# Patient Record
Sex: Male | Born: 1975 | Race: White | Hispanic: No | Marital: Single | Smoking: Current every day smoker
Health system: Southern US, Community
[De-identification: ages and names within clinical notes are randomized; demographics above are authoritative.]

## PROBLEM LIST (undated history)

## (undated) ENCOUNTER — Emergency Department: Payer: Medicaid Other

## (undated) DIAGNOSIS — F1010 Alcohol abuse, uncomplicated: Secondary | ICD-10-CM

## (undated) DIAGNOSIS — F32A Depression, unspecified: Secondary | ICD-10-CM

## (undated) DIAGNOSIS — F419 Anxiety disorder, unspecified: Secondary | ICD-10-CM

## (undated) DIAGNOSIS — R569 Unspecified convulsions: Secondary | ICD-10-CM

## (undated) DIAGNOSIS — F1410 Cocaine abuse, uncomplicated: Secondary | ICD-10-CM

## (undated) DIAGNOSIS — F329 Major depressive disorder, single episode, unspecified: Secondary | ICD-10-CM

## (undated) HISTORY — PX: FINGER SURGERY: SHX640

---

## 2006-03-03 ENCOUNTER — Emergency Department: Payer: Self-pay | Admitting: Emergency Medicine

## 2009-09-06 ENCOUNTER — Emergency Department: Payer: Self-pay | Admitting: Emergency Medicine

## 2009-10-24 ENCOUNTER — Emergency Department: Payer: Self-pay | Admitting: Emergency Medicine

## 2010-04-12 ENCOUNTER — Emergency Department: Payer: Self-pay | Admitting: Emergency Medicine

## 2010-05-12 ENCOUNTER — Emergency Department: Payer: Self-pay | Admitting: Emergency Medicine

## 2010-05-25 ENCOUNTER — Emergency Department: Payer: Self-pay | Admitting: Emergency Medicine

## 2010-07-10 ENCOUNTER — Emergency Department: Payer: Self-pay | Admitting: Internal Medicine

## 2010-07-17 ENCOUNTER — Ambulatory Visit: Payer: Self-pay | Admitting: Orthopedic Surgery

## 2011-05-24 ENCOUNTER — Emergency Department: Payer: Self-pay | Admitting: *Deleted

## 2011-08-01 ENCOUNTER — Inpatient Hospital Stay: Payer: Self-pay | Admitting: Psychiatry

## 2011-09-02 ENCOUNTER — Emergency Department: Payer: Self-pay | Admitting: Internal Medicine

## 2011-10-11 ENCOUNTER — Emergency Department: Payer: Self-pay | Admitting: *Deleted

## 2011-10-11 LAB — DRUG SCREEN, URINE
Amphetamines, Ur Screen: NEGATIVE (ref ?–1000)
Cannabinoid 50 Ng, Ur ~~LOC~~: POSITIVE (ref ?–50)
Cocaine Metabolite,Ur ~~LOC~~: POSITIVE (ref ?–300)
MDMA (Ecstasy)Ur Screen: NEGATIVE (ref ?–500)
Methadone, Ur Screen: NEGATIVE (ref ?–300)
Opiate, Ur Screen: NEGATIVE (ref ?–300)
Phencyclidine (PCP) Ur S: NEGATIVE (ref ?–25)
Tricyclic, Ur Screen: NEGATIVE (ref ?–1000)

## 2011-10-11 LAB — CBC
HGB: 15.9 g/dL (ref 13.0–18.0)
MCH: 32.4 pg (ref 26.0–34.0)
MCHC: 33.6 g/dL (ref 32.0–36.0)
WBC: 9.5 10*3/uL (ref 3.8–10.6)

## 2011-10-11 LAB — ETHANOL: Ethanol: 168 mg/dL

## 2011-10-11 LAB — COMPREHENSIVE METABOLIC PANEL
BUN: 7 mg/dL (ref 7–18)
Bilirubin,Total: 0.4 mg/dL (ref 0.2–1.0)
Chloride: 106 mmol/L (ref 98–107)
Creatinine: 0.83 mg/dL (ref 0.60–1.30)
Glucose: 105 mg/dL — ABNORMAL HIGH (ref 65–99)
Osmolality: 285 (ref 275–301)
SGPT (ALT): 24 U/L
Total Protein: 7.2 g/dL (ref 6.4–8.2)

## 2011-10-11 LAB — TSH: Thyroid Stimulating Horm: 0.93 u[IU]/mL

## 2011-10-11 LAB — ACETAMINOPHEN LEVEL: Acetaminophen: <2 ug/mL

## 2011-10-11 LAB — SALICYLATE LEVEL: Salicylates, Serum: 3.3 mg/dL — ABNORMAL HIGH

## 2011-10-14 ENCOUNTER — Emergency Department: Payer: Self-pay | Admitting: Unknown Physician Specialty

## 2011-10-14 LAB — CBC
HCT: 49.8 % (ref 40.0–52.0)
HGB: 17.3 g/dL (ref 13.0–18.0)
MCH: 33.2 pg (ref 26.0–34.0)
MCV: 96 fL (ref 80–100)
RBC: 5.19 10*6/uL (ref 4.40–5.90)
RDW: 13 % (ref 11.5–14.5)
WBC: 9.3 10*3/uL (ref 3.8–10.6)

## 2011-10-14 LAB — ACETAMINOPHEN LEVEL: Acetaminophen: <2 ug/mL

## 2011-10-14 LAB — COMPREHENSIVE METABOLIC PANEL
Alkaline Phosphatase: 69 U/L (ref 50–136)
Bilirubin,Total: 0.4 mg/dL (ref 0.2–1.0)
Calcium, Total: 8.6 mg/dL (ref 8.5–10.1)
Co2: 23 mmol/L (ref 21–32)
Creatinine: 0.96 mg/dL (ref 0.60–1.30)
EGFR (Non-African Amer.): >60
SGOT(AST): 23 U/L (ref 15–37)
SGPT (ALT): 25 U/L

## 2011-10-14 LAB — SALICYLATE LEVEL: Salicylates, Serum: 2.8 mg/dL

## 2011-10-14 LAB — ETHANOL: Ethanol: 140 mg/dL

## 2011-10-15 LAB — DRUG SCREEN, URINE
Cannabinoid 50 Ng, Ur ~~LOC~~: POSITIVE (ref ?–50)
Cocaine Metabolite,Ur ~~LOC~~: POSITIVE (ref ?–300)
MDMA (Ecstasy)Ur Screen: NEGATIVE (ref ?–500)
Methadone, Ur Screen: NEGATIVE (ref ?–300)
Opiate, Ur Screen: NEGATIVE (ref ?–300)
Phencyclidine (PCP) Ur S: NEGATIVE (ref ?–25)

## 2011-11-23 ENCOUNTER — Emergency Department: Payer: Self-pay | Admitting: *Deleted

## 2011-11-23 LAB — COMPREHENSIVE METABOLIC PANEL
Alkaline Phosphatase: 74 U/L (ref 50–136)
Anion Gap: 17 — ABNORMAL HIGH (ref 7–16)
Creatinine: 1.36 mg/dL — ABNORMAL HIGH (ref 0.60–1.30)
EGFR (African American): >60
EGFR (Non-African Amer.): >60
Glucose: 80 mg/dL (ref 65–99)
Osmolality: 279 (ref 275–301)
Sodium: 141 mmol/L (ref 136–145)

## 2011-11-23 LAB — TSH: Thyroid Stimulating Horm: 1.28 u[IU]/mL

## 2011-11-23 LAB — CBC
HCT: 48.3 % (ref 40.0–52.0)
HGB: 16.7 g/dL (ref 13.0–18.0)
MCH: 33 pg (ref 26.0–34.0)
MCHC: 34.7 g/dL (ref 32.0–36.0)
RBC: 5.08 10*6/uL (ref 4.40–5.90)
RDW: 12.8 % (ref 11.5–14.5)

## 2011-11-23 LAB — ETHANOL
Ethanol: 190 mg/dL
Ethanol: 88 mg/dL

## 2011-11-23 LAB — DRUG SCREEN, URINE
Amphetamines, Ur Screen: NEGATIVE (ref ?–1000)
Benzodiazepine, Ur Scrn: NEGATIVE (ref ?–200)
Cocaine Metabolite,Ur ~~LOC~~: POSITIVE (ref ?–300)
MDMA (Ecstasy)Ur Screen: NEGATIVE (ref ?–500)
Methadone, Ur Screen: NEGATIVE (ref ?–300)
Opiate, Ur Screen: NEGATIVE (ref ?–300)
Tricyclic, Ur Screen: NEGATIVE (ref ?–1000)

## 2011-12-11 ENCOUNTER — Emergency Department: Payer: Self-pay | Admitting: Emergency Medicine

## 2011-12-13 ENCOUNTER — Emergency Department: Payer: Self-pay | Admitting: Emergency Medicine

## 2011-12-19 ENCOUNTER — Emergency Department: Payer: Self-pay | Admitting: Emergency Medicine

## 2011-12-19 LAB — CBC
HCT: 42 %
HGB: 14.4 g/dL
MCH: 33 pg
MCHC: 34.2 g/dL
MCV: 97 fL
Platelet: 181 x10 3/mm 3
RBC: 4.35 x10 6/mm 3 — ABNORMAL LOW
RDW: 13.3 %
WBC: 6.1 x10 3/mm 3

## 2011-12-19 LAB — DRUG SCREEN, URINE
Amphetamines, Ur Screen: NEGATIVE
Barbiturates, Ur Screen: NEGATIVE
Benzodiazepine, Ur Scrn: NEGATIVE
Cannabinoid 50 Ng, Ur ~~LOC~~: POSITIVE
Cocaine Metabolite,Ur ~~LOC~~: NEGATIVE
MDMA (Ecstasy)Ur Screen: NEGATIVE
Methadone, Ur Screen: NEGATIVE
Opiate, Ur Screen: NEGATIVE
Phencyclidine (PCP) Ur S: NEGATIVE
Tricyclic, Ur Screen: NEGATIVE

## 2011-12-19 LAB — COMPREHENSIVE METABOLIC PANEL WITH GFR
Albumin: 3.6 g/dL
Alkaline Phosphatase: 61 U/L
Anion Gap: 15
BUN: 12 mg/dL
Bilirubin,Total: 0.2 mg/dL
Calcium, Total: 8 mg/dL — ABNORMAL LOW
Chloride: 110 mmol/L — ABNORMAL HIGH
Co2: 22 mmol/L
Creatinine: 1.06 mg/dL
EGFR (African American): >60
EGFR (Non-African Amer.): >60
Glucose: 97 mg/dL
Osmolality: 292
Potassium: 3.7 mmol/L
SGOT(AST): 26 U/L
SGPT (ALT): 25 U/L
Sodium: 147 mmol/L — ABNORMAL HIGH
Total Protein: 6.9 g/dL

## 2011-12-19 LAB — ETHANOL
Ethanol %: 0.187 % — ABNORMAL HIGH
Ethanol: 187 mg/dL

## 2011-12-19 LAB — SALICYLATE LEVEL: Salicylates, Serum: 2.9 mg/dL — ABNORMAL HIGH

## 2011-12-19 LAB — TSH: Thyroid Stimulating Horm: 0.473 u[IU]/mL

## 2012-08-13 ENCOUNTER — Emergency Department: Payer: Self-pay | Admitting: Emergency Medicine

## 2012-08-14 LAB — ETHANOL: Ethanol %: 0.159 % — ABNORMAL HIGH (ref 0.000–0.080)

## 2012-12-06 ENCOUNTER — Emergency Department: Payer: Self-pay | Admitting: Emergency Medicine

## 2012-12-06 LAB — CBC
HCT: 44.9 % (ref 40.0–52.0)
HGB: 14.7 g/dL (ref 13.0–18.0)
Platelet: 175 10*3/uL (ref 150–440)
RBC: 4.54 10*6/uL (ref 4.40–5.90)
RDW: 15 % — ABNORMAL HIGH (ref 11.5–14.5)
WBC: 10.7 10*3/uL — ABNORMAL HIGH (ref 3.8–10.6)

## 2012-12-06 LAB — BASIC METABOLIC PANEL
Anion Gap: 9 (ref 7–16)
Calcium, Total: 8.5 mg/dL (ref 8.5–10.1)
Chloride: 106 mmol/L (ref 98–107)
Co2: 24 mmol/L (ref 21–32)
Creatinine: 0.75 mg/dL (ref 0.60–1.30)
EGFR (African American): >60
EGFR (Non-African Amer.): >60
Glucose: 98 mg/dL (ref 65–99)
Osmolality: 276 (ref 275–301)

## 2013-01-15 ENCOUNTER — Emergency Department: Payer: Self-pay | Admitting: Emergency Medicine

## 2013-01-15 LAB — CBC WITH DIFFERENTIAL/PLATELET
Basophil #: 0.1 10*3/uL (ref 0.0–0.1)
Eosinophil #: 0.2 10*3/uL (ref 0.0–0.7)
Eosinophil %: 2.1 %
Lymphocyte %: 22.7 %
MCH: 33.9 pg (ref 26.0–34.0)
Monocyte #: 0.6 x10 3/mm (ref 0.2–1.0)
Neutrophil #: 5.4 10*3/uL (ref 1.4–6.5)
Platelet: 153 10*3/uL (ref 150–440)
RBC: 4.69 10*6/uL (ref 4.40–5.90)

## 2013-01-15 LAB — COMPREHENSIVE METABOLIC PANEL
BUN: 9 mg/dL (ref 7–18)
Calcium, Total: 8.3 mg/dL — ABNORMAL LOW (ref 8.5–10.1)
Chloride: 111 mmol/L — ABNORMAL HIGH (ref 98–107)
Co2: 24 mmol/L (ref 21–32)
Glucose: 96 mg/dL (ref 65–99)
Potassium: 3.8 mmol/L (ref 3.5–5.1)
SGOT(AST): 25 U/L (ref 15–37)
Sodium: 143 mmol/L (ref 136–145)
Total Protein: 7.1 g/dL (ref 6.4–8.2)

## 2013-01-17 ENCOUNTER — Emergency Department: Payer: Self-pay | Admitting: Unknown Physician Specialty

## 2013-01-17 LAB — COMPREHENSIVE METABOLIC PANEL
Anion Gap: 8 (ref 7–16)
Bilirubin,Total: 0.2 mg/dL (ref 0.2–1.0)
Calcium, Total: 8.3 mg/dL — ABNORMAL LOW (ref 8.5–10.1)
Creatinine: 0.79 mg/dL (ref 0.60–1.30)
EGFR (African American): >60
Glucose: 112 mg/dL — ABNORMAL HIGH (ref 65–99)
SGPT (ALT): 19 U/L (ref 12–78)
Sodium: 143 mmol/L (ref 136–145)

## 2013-01-17 LAB — URINALYSIS, COMPLETE
Bilirubin,UR: NEGATIVE
Blood: NEGATIVE
Leukocyte Esterase: NEGATIVE
Ph: 5 (ref 4.5–8.0)
Protein: NEGATIVE
Specific Gravity: 1.019 (ref 1.003–1.030)
Squamous Epithelial: <1

## 2013-01-17 LAB — DRUG SCREEN, URINE
Methadone, Ur Screen: NEGATIVE (ref ?–300)
Opiate, Ur Screen: NEGATIVE (ref ?–300)
Phencyclidine (PCP) Ur S: NEGATIVE (ref ?–25)
Tricyclic, Ur Screen: NEGATIVE (ref ?–1000)

## 2013-01-17 LAB — CBC
MCH: 33.2 pg (ref 26.0–34.0)
MCHC: 33.8 g/dL (ref 32.0–36.0)
MCV: 98 fL (ref 80–100)
Platelet: 164 10*3/uL (ref 150–440)
RBC: 4.88 10*6/uL (ref 4.40–5.90)

## 2013-01-17 LAB — TSH: Thyroid Stimulating Horm: 0.805 u[IU]/mL

## 2013-01-17 LAB — ETHANOL: Ethanol: 240 mg/dL

## 2013-01-25 ENCOUNTER — Emergency Department: Payer: Self-pay | Admitting: Emergency Medicine

## 2013-03-14 ENCOUNTER — Emergency Department: Payer: Self-pay | Admitting: Emergency Medicine

## 2013-03-14 LAB — URINALYSIS, COMPLETE
Bacteria: NONE SEEN
Bilirubin,UR: NEGATIVE
Ketone: NEGATIVE
Leukocyte Esterase: NEGATIVE
Nitrite: NEGATIVE
Protein: NEGATIVE
Squamous Epithelial: <1

## 2013-04-08 ENCOUNTER — Emergency Department: Payer: Self-pay | Admitting: Internal Medicine

## 2013-04-08 LAB — ETHANOL
Ethanol %: 0.315 % (ref 0.000–0.080)
Ethanol: 315 mg/dL

## 2013-04-08 LAB — COMPREHENSIVE METABOLIC PANEL
Alkaline Phosphatase: 101 U/L (ref 50–136)
Anion Gap: 10 (ref 7–16)
Bilirubin,Total: 0.4 mg/dL (ref 0.2–1.0)
Calcium, Total: 8.7 mg/dL (ref 8.5–10.1)
Chloride: 101 mmol/L (ref 98–107)
EGFR (African American): >60
EGFR (Non-African Amer.): >60
Potassium: 4 mmol/L (ref 3.5–5.1)
SGOT(AST): 51 U/L — ABNORMAL HIGH (ref 15–37)
SGPT (ALT): 47 U/L (ref 12–78)
Total Protein: 7.8 g/dL (ref 6.4–8.2)

## 2013-04-08 LAB — CBC
HCT: 47.5 % (ref 40.0–52.0)
HGB: 16.7 g/dL (ref 13.0–18.0)
MCH: 34.1 pg — ABNORMAL HIGH (ref 26.0–34.0)
Platelet: 188 10*3/uL (ref 150–440)

## 2013-04-08 LAB — ACETAMINOPHEN LEVEL: Acetaminophen: <2 ug/mL

## 2013-04-08 LAB — URINALYSIS, COMPLETE
Bacteria: NONE SEEN
Blood: NEGATIVE
Glucose,UR: NEGATIVE mg/dL (ref 0–75)
Leukocyte Esterase: NEGATIVE
Ph: 5 (ref 4.5–8.0)
Protein: NEGATIVE
Specific Gravity: 1.005 (ref 1.003–1.030)
WBC UR: <1 /HPF (ref 0–5)

## 2013-04-08 LAB — DRUG SCREEN, URINE
Barbiturates, Ur Screen: NEGATIVE (ref ?–200)
Benzodiazepine, Ur Scrn: NEGATIVE (ref ?–200)
Cocaine Metabolite,Ur ~~LOC~~: POSITIVE (ref ?–300)
MDMA (Ecstasy)Ur Screen: NEGATIVE (ref ?–500)
Opiate, Ur Screen: NEGATIVE (ref ?–300)
Phencyclidine (PCP) Ur S: NEGATIVE (ref ?–25)
Tricyclic, Ur Screen: NEGATIVE (ref ?–1000)

## 2013-04-09 LAB — CBC
HCT: 47.6 % (ref 40.0–52.0)
MCH: 33.5 pg (ref 26.0–34.0)
MCHC: 34.4 g/dL (ref 32.0–36.0)
MCV: 97 fL (ref 80–100)
Platelet: 174 10*3/uL (ref 150–440)
RBC: 4.89 10*6/uL (ref 4.40–5.90)
RDW: 14.1 % (ref 11.5–14.5)

## 2013-04-09 LAB — ETHANOL: Ethanol: <3 mg/dL

## 2013-07-03 ENCOUNTER — Emergency Department: Payer: Self-pay | Admitting: Emergency Medicine

## 2013-07-03 LAB — CBC
HCT: 49.1 % (ref 40.0–52.0)
HGB: 17.2 g/dL (ref 13.0–18.0)
MCH: 34.9 pg — ABNORMAL HIGH (ref 26.0–34.0)
MCHC: 35.1 g/dL (ref 32.0–36.0)
MCV: 100 fL (ref 80–100)
Platelet: 189 10*3/uL (ref 150–440)
RBC: 4.93 10*6/uL (ref 4.40–5.90)
RDW: 14.4 % (ref 11.5–14.5)
WBC: 6.9 10*3/uL (ref 3.8–10.6)

## 2013-07-04 LAB — COMPREHENSIVE METABOLIC PANEL
Albumin: 4 g/dL (ref 3.4–5.0)
Alkaline Phosphatase: 103 U/L (ref 50–136)
BUN: 7 mg/dL (ref 7–18)
Calcium, Total: 8.6 mg/dL (ref 8.5–10.1)
EGFR (African American): >60
EGFR (Non-African Amer.): >60
Potassium: 3.7 mmol/L (ref 3.5–5.1)
SGOT(AST): 93 U/L — ABNORMAL HIGH (ref 15–37)
SGPT (ALT): 87 U/L — ABNORMAL HIGH (ref 12–78)

## 2013-07-04 LAB — DRUG SCREEN, URINE
Amphetamines, Ur Screen: NEGATIVE (ref ?–1000)
Barbiturates, Ur Screen: NEGATIVE (ref ?–200)
Benzodiazepine, Ur Scrn: NEGATIVE (ref ?–200)
Cocaine Metabolite,Ur ~~LOC~~: POSITIVE (ref ?–300)
Methadone, Ur Screen: NEGATIVE (ref ?–300)
Opiate, Ur Screen: NEGATIVE (ref ?–300)
Phencyclidine (PCP) Ur S: NEGATIVE (ref ?–25)
Tricyclic, Ur Screen: NEGATIVE (ref ?–1000)

## 2013-07-04 LAB — URINALYSIS, COMPLETE
Bilirubin,UR: NEGATIVE
Blood: NEGATIVE
Nitrite: NEGATIVE
RBC,UR: NONE SEEN /HPF (ref 0–5)
Specific Gravity: 1.009 (ref 1.003–1.030)

## 2013-07-04 LAB — ACETAMINOPHEN LEVEL: Acetaminophen: <2 ug/mL

## 2013-07-04 LAB — SALICYLATE LEVEL: Salicylates, Serum: 5.2 mg/dL — ABNORMAL HIGH

## 2013-07-04 LAB — ETHANOL: Ethanol %: 0.294 % — ABNORMAL HIGH (ref 0.000–0.080)

## 2013-07-24 ENCOUNTER — Emergency Department: Payer: Self-pay | Admitting: Emergency Medicine

## 2013-07-24 LAB — COMPREHENSIVE METABOLIC PANEL
Alkaline Phosphatase: 81 U/L (ref 50–136)
Anion Gap: 4 — ABNORMAL LOW (ref 7–16)
BUN: 13 mg/dL (ref 7–18)
Bilirubin,Total: 0.3 mg/dL (ref 0.2–1.0)
Calcium, Total: 9 mg/dL (ref 8.5–10.1)
Chloride: 106 mmol/L (ref 98–107)
Creatinine: 0.83 mg/dL (ref 0.60–1.30)
EGFR (African American): >60
Potassium: 3.9 mmol/L (ref 3.5–5.1)
SGPT (ALT): 51 U/L (ref 12–78)
Sodium: 136 mmol/L (ref 136–145)
Total Protein: 7.3 g/dL (ref 6.4–8.2)

## 2013-07-24 LAB — DRUG SCREEN, URINE
Benzodiazepine, Ur Scrn: NEGATIVE (ref ?–200)
Cocaine Metabolite,Ur ~~LOC~~: NEGATIVE (ref ?–300)
MDMA (Ecstasy)Ur Screen: NEGATIVE (ref ?–500)
Methadone, Ur Screen: NEGATIVE (ref ?–300)
Opiate, Ur Screen: NEGATIVE (ref ?–300)
Phencyclidine (PCP) Ur S: NEGATIVE (ref ?–25)
Tricyclic, Ur Screen: NEGATIVE (ref ?–1000)

## 2013-07-24 LAB — URINALYSIS, COMPLETE
Bacteria: NONE SEEN
Bilirubin,UR: NEGATIVE
Blood: NEGATIVE
Glucose,UR: NEGATIVE mg/dL (ref 0–75)
Ketone: NEGATIVE
Leukocyte Esterase: NEGATIVE
Nitrite: NEGATIVE
Ph: 6 (ref 4.5–8.0)
RBC,UR: NONE SEEN /HPF (ref 0–5)
Specific Gravity: 1.01 (ref 1.003–1.030)

## 2013-07-24 LAB — CBC
HGB: 15.5 g/dL (ref 13.0–18.0)
MCHC: 35.2 g/dL (ref 32.0–36.0)
WBC: 11 10*3/uL — ABNORMAL HIGH (ref 3.8–10.6)

## 2013-07-24 LAB — ETHANOL: Ethanol %: <0.003 % (ref 0.000–0.080)

## 2013-07-24 LAB — TSH: Thyroid Stimulating Horm: 2.56 u[IU]/mL

## 2014-01-06 ENCOUNTER — Emergency Department: Payer: Self-pay | Admitting: Emergency Medicine

## 2014-08-16 ENCOUNTER — Emergency Department: Payer: Self-pay | Admitting: Emergency Medicine

## 2015-01-17 NOTE — Consult Note (Signed)
PATIENT NAME:  Alex [name], Alex [name] MR#:  [id number] DATE OF BIRTH:  1976/08/24  DATE OF CONSULTATION:  01/17/2013  REFERRING PHYSICIAN:  Lucrezia Europe, MD  CONSULTING PHYSICIAN:  Ardeen Fillers. Garnetta Buddy, MD  REASON FOR CONSULTATION: Alcohol detox.   HISTORY OF PRESENT ILLNESS: The patient is 39 year old male with a long history of substance dependence and depression who was brought to the ER by the police stating, "I had a bad day, and I went the other way." The patient was recently discharged from the ED in January 2013 and started using drugs and alcohol. He reported that he got depressed and was using alcohol for the past 2 to 3 days. He reported that he does not have any suicidal ideations but had a couple of beers and some cocaine. He reported that he wants to keep away from using that stuff because that makes him messed up. He was very aggressive and agitated at the initial assessment with the ED team. He also mentioned that he had an appointment with the ACT team on 01/29/2013.   During my assessment, the patient reported that he was consuming liquor and beer with his brother. He was drinking for 2 to 3 days. He reported that he also smoked 1joint of marijuana and spent $40 on cocaine. The patient reported that his drug of choice is marijuana. However, when he starts using drugs he becomes more depressed and also starts having suicidal thoughts at that time. However, he also is not having any thoughts to harm himself or anybody else. He reported that he was prescribed citalopram by Dr. Quillian Quince in [facility], which was really helping him; but he has not taken any medication for the past couple of days, and he started drinking alcohol. He reported that when he is using the medication on a persistent basis it really helps with his depressive symptoms. He currently denied having any anxiety, paranoia or any depressive symptoms.   PAST PSYCHIATRIC HISTORY: The patient has a history of previous admission to the hospital  in the past. He was discharged for followup in the community. He has been seen in the Emergency Room for similar presentations while he was intoxicated. He has been prescribed citalopram and trazodone, for which he remains compliant. He is supposed to be seeing ACT team on May 5th.    MEDICAL HISTORY: The patient currently denied having any medical problems.   SOCIAL HISTORY: The patient currently receives Disability for unknown condition. He lives with his mother and brother. He currently denied having any legal charges. He is currently not married and does not have any children.   SUBSTANCE ABUSE HISTORY: The patient reported that he has been using alcohol for years. His drug of choice is marijuana.   REVIEW OF SYSTEMS:  CONSTITUTIONAL: Currently denied having any fever or chills. No weight changes.  EYES: No double or blurred vision.  RESPIRATORY: No shortness of breath or cough.  CARDIOVASCULAR: No chest pain, orthopnea.  GASTROINTESTINAL: No abdominal pain, nausea, vomiting or diarrhea.  GENITOURINARY: No incontinence or frequency.  ENDOCRINE: No heat or cold intolerance.  LYMPHATIC: No anemia or easy bruising.  INTEGUMENTARY: No acne or rash.  MUSCULOSKELETAL: No muscle or joint pain.  NEUROLOGIC: No tingling or weakness.   VITAL SIGNS: Temperature  97.8, pulse 94, respirations 18, blood pressure 121/71.  LABORATORY DATA: Glucose 112, BUN 9, creatinine 0.79, sodium 143, potassium 3.5, chloride 110, bicarbonate 25, anion gap 8, osmolality 284, calcium 8.3.  Blood alcohol level 240. Protein 7.5, albumin  3.9, bilirubin 0.2. Urine drug screen positive for cocaine and cannabinoids. WBC 7.7, RBC 4.8, hemoglobin 16.2, hematocrit 47.9, platelet count 164.   MENTAL STATUS EXAMINATION: The patient is a moderately-built male who appeared his stated age. He was calm and cooperative. He maintained fair eye contact. His speech was normal in tone and volume. Mood was fine. Affect was congruent.  Thought process was logical, goal-directed. Thought content was nondelusional. He demonstrated poor insight and judgment regarding his drug use.   DIAGNOSTIC IMPRESSION: AXIS I: 1.  Major depressive disorder, recurrent, moderate.  2.  Polysubstance dependence.   AXIS II:  None.  AXIS III:  No diagnosis.   TREATMENT PLAN:  I discussed with the patient at length about his recurrent use of drugs as well as relapsing on alcohol, and he does not have any history of seizures and is not showing any significant symptoms of alcohol withdrawal. He was also given some counseling about substance abuse, and he currently denied that he is interested in going to a substance abuse treatment program at this time. He reported that he has a follow-up appointment with the ACT team, and he will go there. The patient will be released from the involuntary commitment at this time. He will continue with his citalopram 20 mg as prescribed by Dr. Quillian QuinceBliss. He will also follow up with the ACT team appointment. The patient contracted for safety and will be discharged from the Emergency Room at this time. No new prescriptions given.   Thank you for allowing me to participate in the care of this patient.   ____________________________ Ardeen FillersUzma S. Garnetta BuddyFaheem, MD usf:cb D: 01/18/2013 15:56:06 ET T: 01/18/2013 16:13:47 ET JOB#: [id number]  cc: Ardeen FillersUzma S. Garnetta BuddyFaheem, MD, <Dictator> Rhunette CroftUZMA S Nowell Sites MD ELECTRONICALLY SIGNED 01/21/2013 11:51

## 2015-01-17 NOTE — Consult Note (Signed)
Brief Consult Note: Diagnosis: alcohol dependence.   Patient was seen by consultant.   Consult note dictated.   Recommend further assessment or treatment.   Discussed with Attending MD.   Comments: Psychiatry: Patient seen. Patient with alcohol and cocaine dependence presents after having made threatening statements and showing agitated behavior. Patient will be referred to [facility] tomorrow under the current IVC. Patient is aware of this. Orders also done for presumptive VD treatment. Continue detox orders.  Electronic Signatures: Audery Amellapacs, John T (MD)  (Signed 08-Oct-14 18:31)  Authored: Brief Consult Note   Last Updated: 08-Oct-14 18:31 by Audery Amellapacs, John T (MD)

## 2015-01-17 NOTE — Consult Note (Signed)
PATIENT NAME:  Alex [name], [name] MR#:  [id number] DATE OF BIRTH:  Jun 25, 1976  DATE OF CONSULTATION:  07/04/2013  REFERRING PHYSICIAN:   CONSULTING PHYSICIAN:  Audery AmelJohn T. Clapacs, MD  IDENTIFYING INFORMATION AND REASON FOR CONSULTATION: A 39 year old man who came to the hospital intoxicated under involuntary commitment. Consult for appropriate disposition.   HISTORY OF PRESENT ILLNESS: Information obtained from the patient and the chart. The patient was brought to the Emergency Room after making threats. He had made suicidal statements and also had threatened to assault or kill other people, specifically a girlfriend who he believes has given him a venereal disease. The patient tells me that he remembers saying that he was going to hurt himself. At this point, he denies any intention to hurt himself but he is not sure whether he really meant it at the time. Evidently, he was quite irritated and agitated when he came into the Emergency Room last night. He admits that he drinks "too much." He will not even hazard a guess as to the total amount. He says that he has been drinking steadily ever since he got out of prison in January and that was only 90 days. Before that, he has a long-standing history of drinking as well. The patient currently is not reporting specific mood symptoms although he does say he feels like his life is under a lot of stress. Interestingly, he says that a big part of his stress is worrying about his mother while at the same time some of his threats occur towards his mother, whom he gets angry at because she wants him to stop drinking.   PAST PSYCHIATRIC HISTORY: He has had multiple visits to our Emergency Room related to substance abuse. The patient is on chronic disability. I do not have full old records but, having interacted with him, I would not be surprised if he has some developmental disability. He is treated chronically with an antidepressant. It is not clear how well it works. He  denies having any history of suicide attempts in the past. He has been aggressive in the past, especially when drinking.   PAST MEDICAL HISTORY: No history of heart disease, seizures, diabetes or other significant chronic medical problems.   SUBSTANCE ABUSE HISTORY: He says he has been drinking heavily. He denies ever having had a seizure related to drinking and denies any history of delirium tremens. He also uses cocaine and marijuana regularly. He admits that they make his mood bad and cause problems for him. He has never been to any kind of inpatient program. He says that he has tried to quit drinking but never been able to do it on his own for more than a day or so.   SOCIAL HISTORY: The patient is disabled, apparently due to mental health concerns. He lives with his mother. Other than drinking, it sounds like he does not have a lot of that goes on in his life.   FAMILY HISTORY: Unknown.   REVIEW OF SYSTEMS: Currently, he complains that he is having discomfort when he urinates and he says he is certain that he has a venereal disease. Otherwise, he is not reporting nausea, vomiting, diarrhea, any pulmonary complaints or any other pain. He feels a little bit shaky but not too bad.   MENTAL STATUS EXAMINATION: The patient appears his stated age. He was passively cooperative with the interview. He was awake when I came in to see him and stayed awake throughout the interview. Eye contact intermittent. Psychomotor activity  normal. Speech decreased in total amount but otherwise unremarkable. Affect was somewhat inappropriately goofy. He smiles and giggles a lot. His mood was stated as okay. His thoughts appear slow and somewhat simple and concrete. He did not make any obviously delusional statements. Denies hallucinations. Denies any current suicidal or homicidal ideation. His judgment and insight are chronically impaired. Intelligence appears to probably be below average. Alert and oriented currently.    LABORATORY RESULTS: Drug screen was positive for cocaine and cannabis. Alcohol level late last night was 294. TSH normal at 1.59. ALT and AST both elevated at 87 and 93 respectively. CBC unremarkable. Urinalysis looks clean. Salicylates and acetaminophen nontoxic.   ASSESSMENT: A 39 year old man with alcohol dependence and cocaine dependence who presented with agitated, threatening, hostile behavior while intoxicated which has been a recurrent problem for him. The patient has not been able to stop drinking on his own despite referrals to outpatient treatment. Given the degree of disability he has had from it, it seems appropriate that he go to a more intensive inpatient substance abuse treatment program.   TREATMENT PLAN: He has already been accepted to the alcohol and drug abuse treatment center and has a bed available tomorrow. The patient is aware of this and does not make any real complaint. I would continue the detox protocol that he is getting for now. I have gone ahead and given him empiric treatment for gonorrhea and Chlamydia. Educational therapy done.   DIAGNOSIS, PRINCIPAL AND PRIMARY:  AXIS I:  1.  Alcohol dependence.  2.  Cocaine dependence.  3.  Substance-induced mood disorder.  AXIS II: Rule out developmental disability.  AXIS III: Alcohol withdrawal, presumptive gonorrhea and/or chlamydia.  AXIS IV: Moderate chronic stress from disability.  AXIS V: Functioning at time of evaluation 39.   ____________________________ Audery Amel, MD jtc:cs D: 07/04/2013 18:38:30 ET T: 07/04/2013 18:58:52 ET JOB#: [id number]  cc: Audery Amel, MD, <Dictator> Audery Amel MD ELECTRONICALLY SIGNED 07/06/2013 11:18

## 2015-01-17 NOTE — Consult Note (Signed)
Brief Consult Note: Diagnosis: alcohol dependence.   Patient was seen by consultant.   Recommend further assessment or treatment.   Discussed with Attending MD.   Comments: Pt seen in ED B[facility]. He was admitted for  alcohol and cocaine dependence  and was having making threatening statements and showing agitated behavior. Patient appeared more calm today. he stated that he was taking Celexa and Trazodone. He is going to  [facility] where he will complete his treatment . He denied any SI/HI or plans.  Electronic Signatures: Rhunette CroftFaheem, Epiphany Seltzer S (MD)  (Signed 09-Oct-14 09:45)  Authored: Brief Consult Note   Last Updated: 09-Oct-14 09:45 by Rhunette CroftFaheem, Ifeoluwa Bartz S (MD)
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
## 2015-01-19 NOTE — Consult Note (Signed)
Brief Consult Note: Diagnosis: depression nos, polysubstance dependence.   Patient was seen by consultant.   Recommend further assessment or treatment.   Orders entered.   Comments: Psychiatry: PAtient seen and chart reviewed. Patient was intoxicated but now has slept and sobered up. Denies any suicidal ideation. Denies psychosis. Claims his meds were lost last ER visit and so he has been out of them. We discussed treatment but he does not want to consider SA treatment.  No indication for admission. Counceling done re depression and SA. Will give new copies of his prescriptions for celexa 20mg  qday and trazodone 100mg  qhs. Staff will assist in referral to outpt treatment.  Electronic Signatures: Audery Amellapacs, Renelda Kilian T (MD)  (Signed 18-Jan-13 12:11)  Authored: Brief Consult Note   Last Updated: 18-Jan-13 12:11 by Audery Amellapacs, Vane Yapp T (MD)

## 2015-01-19 NOTE — Consult Note (Signed)
PATIENT NAME:  Alex [name], Alex [name] MR#:  [id number] DATE OF BIRTH:  July 31, 1976  DATE OF CONSULTATION:  10/15/2011  REFERRING PHYSICIAN:   CONSULTING PHYSICIAN:  Audery Amel, MD  IDENTIFYING INFORMATION AND REASON FOR CONSULT: This is a 39 year old man with a history of substance abuse and depression who brought himself in to the Emergency Room yesterday stating that he "couldn't take it anymore".   HISTORY OF PRESENT ILLNESS: The patient was interviewed and I discussed the case with the other psychiatric staff and reviewed his chart. The patient came in last night and was intoxicated with an elevated alcohol level and a drug screen positive for cocaine and marijuana. At no time was he voicing any suicidal ideation nor was he aggressive or violent or threatening. He did make some statements about how he couldn't take it anymore but it doesn't seem there was anything specific that he said. He has slept through the night and was re-interviewed today. The patient tells me that yesterday he felt like he just couldn't stay around his house anymore. I asked him whether this was because he was angry and he denied it, I asked him if it was because there was something there that was upsetting him and he denied it. I suggested that he may just be restless and bored and he said that may be part of it. He said that his mood has been a little bit more depressed but is better now. He denies any suicidal ideation or wish to die. He admits that he drinks alcohol daily. Yesterday he drank about a fifth of liquor by his estimate. He also admits that he used cocaine and has been using marijuana. He tends to minimize the frequency with which he is doing this. He does not report any psychotic symptoms. The patient did not show any real insight into how his substance use is affecting his mood and his life. I suggested to him that continued alcohol and drug use was probably making him more depressed and anxious and making it  impossible for him to function well and achieve the things that he wants to do. He passively agrees to some of this but when I asked him if he would be interested in assistance in stopping drinking or using drugs he says that he would prefer not to at this point. The patient was counseled about the risks including risk of death by suicide, accident, or illnesses related to ongoing substance abuse. He will not tell me that there has been any new change in his circumstances at home. He tells me that when he was here in the Emergency Room three days ago or so he had his antidepressant medicine with him and that it was misplaced. We have asked around and it sounds like this is probably a plausible story.   PAST PSYCHIATRIC HISTORY: He has had one previous admission here to the hospital just a couple of months ago, similar circumstances. It was a fairly brief hospitalization and he was discharged for follow-up treatment in the community. Since then he has had a couple of trips to the Emergency Room while he was intoxicated. On at least one occasion he has talked about suicide but then when he sobers up he denies that he has suicidal intent or plan. He has been treated with citalopram and trazodone and says that he did think they were helping with his mood. It does not seem like he has really ever engaged in any kind of substance abuse treatment.  He denies that he has ever actually tried to kill himself in the past.   PAST MEDICAL HISTORY: No known ongoing medical problems.   SOCIAL HISTORY: The patient receives disability for an unknown condition. He lives with his mother and brother. He says that he has very little money and no transportation. He denies, however, that he fights or that there is hostility at the home. He is not married and has no children.   SUBSTANCE ABUSE HISTORY: He is vague about his substance use, sounds like he's been using alcohol for years, intermittent use of drugs. Has never been to an  inpatient rehab. Does not have much experience with substance abuse treatment. Does not seem to have much interest in it.   REVIEW OF SYSTEMS: He says he is still feeling tired and feels a little bit depressed but that he is better than yesterday. He is not throwing up or nauseated or having diarrhea. Does not have any acute pain. No suicidal ideation. No hallucinations.   MENTAL STATUS EXAMINATION: Disheveled man interviewed in the Emergency Room. He was asleep when I went in but woke up easily and was cooperative with the interview. Eye contact was decreased. Psychomotor activity was a little bit sluggish. Speech was decreased in tone and in total production. Easy to understand when he did speak. Affect was a little bit blunted but not completely. Not bizarre. Mood was stated as still a little bit depressed but better. Thoughts were slow but lucid. No evidence of delusions, paranoia, or grossly bizarre thinking. Denies auditory or visual hallucinations. He denies any suicidal or homicidal ideation whatsoever. His judgment and insight both seem to be somewhat impaired especially regarding his substance abuse.   TREATMENT PLAN: The patient is not currently suicidal. Has not done anything to hurt himself. He does not have a history of seizures or DTs and is not showing significant symptoms of alcohol withdrawal. He does not have another medical problem to put him at high risk that would require hospitalization. I gave him some counseling about substance abuse and about the risks that he is running and also some counseling about his depression. I offered him that if he voluntarily wanted to come in to the hospital we could discuss this as an option. He declines this and does not seem to be very interested. He did request that his medications be continued. He is pretty disappointed that we have not been able to find his old bottles. I told him I would write a new prescription for them though.   TREATMENT PLAN:   1. He will be released from the Emergency Room. He is not under involuntary petition. I have talked with the staff here and [facility] for follow-up care.  2. I have written a new prescription for citalopram 20 mg a day and trazodone 100 mg at night which he was taking previously with some benefit. He will be given a one month's supply prescription for those.  3. He is reminded that he can come back to the Emergency Room in the future if needed.   DIAGNOSES PRINCIPLE AND PRIMARY:  AXIS I: Depression, not otherwise specified.   SECONDARY DIAGNOSES:  AXIS I: Polysubstance dependence.   AXIS II: Deferred.   AXIS III: No diagnosis.   AXIS IV: Moderate to severe. Ongoing stress from poverty, lack of transportation, lack of resources.   AXIS V: Functioning at time of evaluation 49.   ____________________________ Jackquline DenmarkJohn T.  Clapacs, MD jtc:drc D: 10/15/2011 12:21:14 ET T: 10/15/2011 12:40:58 ET JOB#: [id number]  cc: Audery Amel, MD, <Dictator> Audery Amel MD ELECTRONICALLY SIGNED 10/15/2011 13:12

## 2015-02-24 ENCOUNTER — Encounter: Payer: Self-pay | Admitting: *Deleted

## 2015-02-24 ENCOUNTER — Emergency Department
Admission: EM | Admit: 2015-02-24 | Discharge: 2015-02-24 | Disposition: A | Discharge status: Home / Self Care | Attending: Emergency Medicine | Admitting: Emergency Medicine

## 2015-02-24 DIAGNOSIS — W228XXA Striking against or struck by other objects, initial encounter: Secondary | ICD-10-CM | POA: Insufficient documentation

## 2015-02-24 DIAGNOSIS — Y9389 Activity, other specified: Secondary | ICD-10-CM | POA: Insufficient documentation

## 2015-02-24 DIAGNOSIS — T148XXA Other injury of unspecified body region, initial encounter: Secondary | ICD-10-CM

## 2015-02-24 DIAGNOSIS — S0990XA Unspecified injury of head, initial encounter: Secondary | ICD-10-CM | POA: Insufficient documentation

## 2015-02-24 DIAGNOSIS — S6992XA Unspecified injury of left wrist, hand and finger(s), initial encounter: Secondary | ICD-10-CM | POA: Insufficient documentation

## 2015-02-24 DIAGNOSIS — S0083XA Contusion of other part of head, initial encounter: Secondary | ICD-10-CM | POA: Insufficient documentation

## 2015-02-24 DIAGNOSIS — IMO0002 Reserved for concepts with insufficient information to code with codable children: Secondary | ICD-10-CM

## 2015-02-24 DIAGNOSIS — F10129 Alcohol abuse with intoxication, unspecified: Secondary | ICD-10-CM | POA: Insufficient documentation

## 2015-02-24 DIAGNOSIS — Z0289 Encounter for other administrative examinations: Secondary | ICD-10-CM | POA: Insufficient documentation

## 2015-02-24 DIAGNOSIS — S6991XA Unspecified injury of right wrist, hand and finger(s), initial encounter: Secondary | ICD-10-CM | POA: Insufficient documentation

## 2015-02-24 DIAGNOSIS — Z720 Tobacco use: Secondary | ICD-10-CM | POA: Insufficient documentation

## 2015-02-24 DIAGNOSIS — Y998 Other external cause status: Secondary | ICD-10-CM | POA: Insufficient documentation

## 2015-02-24 DIAGNOSIS — S0081XA Abrasion of other part of head, initial encounter: Secondary | ICD-10-CM | POA: Insufficient documentation

## 2015-02-24 DIAGNOSIS — Y9289 Other specified places as the place of occurrence of the external cause: Secondary | ICD-10-CM | POA: Insufficient documentation

## 2015-02-24 HISTORY — DX: Anxiety disorder, unspecified: F41.9
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
## 2015-02-24 NOTE — Discharge Instructions (Signed)
Abrasion An abrasion is a cut or scrape of the skin. Abrasions do not extend through all layers of the skin and most heal within 10 days. It is important to care for your abrasion properly to prevent infection. CAUSES  Most abrasions are caused by falling on, or gliding across, the ground or other surface. When your skin rubs on something, the outer and inner layer of skin rubs off, causing an abrasion. DIAGNOSIS  Your caregiver will be able to diagnose an abrasion during a physical exam.  TREATMENT  Your treatment depends on how large and deep the abrasion is. Generally, your abrasion will be cleaned with water and a mild soap to remove any dirt or debris. An antibiotic ointment may be put over the abrasion to prevent an infection. A bandage (dressing) may be wrapped around the abrasion to keep it from getting dirty.  You may need a tetanus shot if:  You cannot remember when you had your last tetanus shot.  You have never had a tetanus shot.  The injury broke your skin. If you get a tetanus shot, your arm may swell, get red, and feel warm to the touch. This is common and not a problem. If you need a tetanus shot and you choose not to have one, there is a rare chance of getting tetanus. Sickness from tetanus can be serious.  HOME CARE INSTRUCTIONS   If a dressing was applied, change it at least once a day or as directed by your caregiver. If the bandage sticks, soak it off with warm water.   Wash the area with water and a mild soap to remove all the ointment 2 times a day. Rinse off the soap and pat the area dry with a clean towel.   Reapply any ointment as directed by your caregiver. This will help prevent infection and keep the bandage from sticking. Use gauze over the wound and under the dressing to help keep the bandage from sticking.   Change your dressing right away if it becomes wet or dirty.   Only take over-the-counter or prescription medicines for pain, discomfort, or fever as  directed by your caregiver.   Follow up with your caregiver within 24-48 hours for a wound check, or as directed. If you were not given a wound-check appointment, look closely at your abrasion for redness, swelling, or pus. These are signs of infection. SEEK IMMEDIATE MEDICAL CARE IF:   You have increasing pain in the wound.   You have redness, swelling, or tenderness around the wound.   You have pus coming from the wound.   You have a fever or persistent symptoms for more than 2-3 days.  You have a fever and your symptoms suddenly get worse.  You have a bad smell coming from the wound or dressing.  MAKE SURE YOU:   Understand these instructions.  Will watch your condition.  Will get help right away if you are not doing well or get worse. Document Released: 06/23/2005 Document Revised: 08/30/2012 Document Reviewed: 08/17/2011 [facility] Patient Information 2015 [place], [place]. This information is not intended to replace advice given to you by your health care provider. Make sure you discuss any questions you have with your health care provider.  Contusion A contusion is a deep bruise. Contusions are the result of an injury that caused bleeding under the skin. The contusion may turn blue, purple, or yellow. Minor injuries will give you a painless contusion, but more severe contusions may stay painful and swollen  result of an injury that caused bleeding under the skin. The contusion may turn blue, purple, or yellow. Minor injuries will give you a painless contusion, but more severe contusions may stay painful and swollen for a few weeks.   CAUSES   A contusion is usually caused by a blow, trauma, or direct force to an area of the body.  SYMPTOMS    Swelling and redness of the injured area.   Bruising of the injured area.   Tenderness and soreness of the injured area.   Pain.  DIAGNOSIS   The diagnosis can be made by taking a history and physical exam. An X-ray, CT scan, or MRI may be needed to determine if there were any associated injuries, such as fractures.  TREATMENT   Specific treatment will depend on what area of the body was injured. In general, the best treatment for a contusion is  resting, icing, elevating, and applying cold compresses to the injured area. Over-the-counter medicines may also be recommended for pain control. Ask your caregiver what the best treatment is for your contusion.  HOME CARE INSTRUCTIONS    Put ice on the injured area.   Put ice in a plastic bag.   Place a towel between your skin and the bag.   Leave the ice on for 15-20 minutes, 3-4 times a day, or as directed by your health care provider.   Only take over-the-counter or prescription medicines for pain, discomfort, or fever as directed by your caregiver. Your caregiver may recommend avoiding anti-inflammatory medicines (aspirin, ibuprofen, and naproxen) for 48 hours because these medicines may increase bruising.   Rest the injured area.   If possible, elevate the injured area to reduce swelling.  SEEK IMMEDIATE MEDICAL CARE IF:    You have increased bruising or swelling.   You have pain that is getting worse.   Your swelling or pain is not relieved with medicines.  MAKE SURE YOU:    Understand these instructions.   Will watch your condition.   Will get help right away if you are not doing well or get worse.  Document Released: 06/23/2005 Document Revised: 09/18/2013 Document Reviewed: 07/19/2011  [employer] Patient Information 2015 [employer]. This information is not intended to replace advice given to you by your health care provider. Make sure you discuss any questions you have with your health care provider.    Alcohol Intoxication  Alcohol intoxication occurs when the amount of alcohol that a person has consumed impairs his or her ability to mentally and physically function. Alcohol directly impairs the normal chemical activity of the brain. Drinking large amounts of alcohol can lead to changes in mental function and behavior, and it can cause many physical effects that can be harmful.   Alcohol intoxication can range in severity from mild to very severe. Various factors can affect the level of  intoxication that occurs, such as the person's age, gender, weight, frequency of alcohol consumption, and the presence of other medical conditions (such as diabetes, seizures, or heart conditions). Dangerous levels of alcohol intoxication may occur when people drink large amounts of alcohol in a short period (binge drinking). Alcohol can also be especially dangerous when combined with certain prescription medicines or "recreational" drugs.  SIGNS AND SYMPTOMS  Some common signs and symptoms of mild alcohol intoxication include:   Loss of coordination.   Changes in mood and behavior.   Impaired judgment.   Slurred speech.  As alcohol intoxication progresses to more severe levels, other   be lower than 80 mg/dL (0.08%) to legally drive. However, many dangerous effects of alcohol 1.61%can occur at much lower levels.  TREATMENT  People with alcohol intoxication often do not require treatment. Most of the effects of alcohol intoxication are temporary, and they go away as the alcohol naturally leaves the body. Your health care provider will monitor your condition until you are stable enough to go home. Fluids are sometimes given through an IV access tube to help prevent dehydration.  HOME CARE INSTRUCTIONS  Do not drive after drinking alcohol.  Stay hydrated. Drink enough water and fluids to keep your urine clear or pale yellow. Avoid caffeine.   Only take over-the-counter or prescription medicines as directed by your health care provider.  SEEK MEDICAL CARE IF:    You have persistent vomiting.   You do not feel better after a few days.  You have frequent alcohol intoxication. Your health care provider can help determine if you should see a substance use treatment counselor. SEEK IMMEDIATE MEDICAL CARE IF:   You become shaky or tremble when you try to stop drinking.   You shake uncontrollably (seizure).   You throw up (vomit) blood. This may be bright red or may look like black coffee grounds.   You have blood in your stool. This may be bright red or may appear as a black, tarry, bad smelling stool.   You become lightheaded or faint.  MAKE SURE YOU:   Understand these instructions.  Will watch your condition.  Will get help right away if you are not doing well or get worse. Document Released: 06/23/2005 Document Revised: 05/16/2013 Document Reviewed: 02/16/2013 [facility] Patient Information 2015 [place]. This information is not intended to replace advice given to you by your health care provider. Make sure you discuss any questions you have with your health care provider.
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
## 2015-02-24 NOTE — ED Notes (Signed)
Pt discharged home after verbalizing understanding of discharge instructions; nad noted. 
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
## 2015-12-29 ENCOUNTER — Ambulatory Visit: Payer: Medicaid Other

## 2015-12-29 ENCOUNTER — Encounter: Payer: Self-pay | Admitting: *Deleted

## 2015-12-29 ENCOUNTER — Ambulatory Visit
Admission: EM | Admit: 2015-12-29 | Discharge: 2015-12-29 | Disposition: A | Discharge status: Home / Self Care | Payer: Medicaid Other | Attending: Family Medicine | Admitting: Family Medicine

## 2015-12-29 DIAGNOSIS — S92501A Displaced unspecified fracture of right lesser toe(s), initial encounter for closed fracture: Secondary | ICD-10-CM

## 2015-12-29 DIAGNOSIS — S92911A Unspecified fracture of right toe(s), initial encounter for closed fracture: Secondary | ICD-10-CM | POA: Diagnosis not present

## 2015-12-29 DIAGNOSIS — F419 Anxiety disorder, unspecified: Secondary | ICD-10-CM | POA: Diagnosis not present

## 2015-12-29 DIAGNOSIS — Z79899 Other long term (current) drug therapy: Secondary | ICD-10-CM | POA: Diagnosis not present

## 2015-12-29 DIAGNOSIS — X58XXXA Exposure to other specified factors, initial encounter: Secondary | ICD-10-CM | POA: Diagnosis not present

## 2015-12-29 DIAGNOSIS — M79674 Pain in right toe(s): Secondary | ICD-10-CM | POA: Insufficient documentation

## 2015-12-29 DIAGNOSIS — F17200 Nicotine dependence, unspecified, uncomplicated: Secondary | ICD-10-CM | POA: Insufficient documentation

## 2015-12-29 NOTE — ED Notes (Signed)
Right 5th toe pain x2 weeks. Denies injury. Toe appears without edema or deformity.

## 2015-12-29 NOTE — ED Provider Notes (Signed)
CSN: [id number]     Arrival date & time 12/29/15  [id number] History   First MD Initiated Contact with Patient 12/29/15 1044     Chief Complaint  Patient presents with  . Toe Pain   (Consider location/radiation/quality/duration/timing/severity/associated sxs/prior Treatment) HPI   Patient was not seen by provider; left before being seen  Past Medical History  Diagnosis Date  . Anxiety    History reviewed. No pertinent past surgical history. History reviewed. No pertinent family history. Social History  Substance Use Topics  . Smoking status: Current Every Day Smoker  . Smokeless tobacco: None  . Alcohol Use: Yes    Review of Systems  Allergies  Review of patient's allergies indicates no known allergies.  Home Medications   Prior to Admission medications   Medication Sig Start Date End Date Taking? Authorizing Provider  citalopram (CELEXA) 40 MG tablet Take 40 mg by mouth daily.   Yes Historical Provider, MD   Meds Ordered and Administered this Visit  Medications - No data to display  BP 146/100 mmHg  Pulse 78  Temp(Src) 98.1 F (36.7 C) (Oral)  Resp 16  Ht 5\' 6"  (1.676 m)  Wt 190 lb (86.183 kg)  BMI 30.68 kg/m2  SpO2 95% No data found.   Physical Exam  ED Course  Procedures (including critical care time)  Labs Review Labs Reviewed - No data to display  Imaging Review Dg Toe 5th Right  12/29/2015  CLINICAL DATA:  40 year old male with right toe pain. May be injured but does not remember. Initial encounter. EXAM: RIGHT FIFTH TOE COMPARISON:  None. FINDINGS: Spiral/oblique fracture right fifth proximal phalanx with minimal angulation. Tiny fracture fragment separate from the main fracture fragments. Osseous structures adjacent to the right fifth middle phalanx proximal aspect appears well corticated and may be unrelated to acute injury. IMPRESSION: Spiral/oblique fracture right fifth proximal phalanx with minimal angulation. Tiny fracture fragment separate from the  main fracture fragments. Electronically Signed   By: Lacy DuverneySteven  Olson M.D.   On: 12/29/2015 10:45     Visual Acuity Review  Right Eye Distance:   Left Eye Distance:   Bilateral Distance:    Right Eye Near:   Left Eye Near:    Bilateral Near:         MDM   1. Fracture of fifth toe, right, closed, initial encounter    Patient left without being seen. he did not wish to wait.    Lutricia FeilWilliam P Roemer, PA-C 12/29/15 2143

## 2016-05-27 ENCOUNTER — Encounter: Payer: Self-pay | Admitting: Emergency Medicine

## 2016-05-27 ENCOUNTER — Emergency Department
Admission: EM | Admit: 2016-05-27 | Discharge: 2016-05-27 | Disposition: A | Discharge status: Home / Self Care | Payer: Medicaid Other | Attending: Emergency Medicine | Admitting: Emergency Medicine

## 2016-05-27 DIAGNOSIS — F1290 Cannabis use, unspecified, uncomplicated: Secondary | ICD-10-CM | POA: Insufficient documentation

## 2016-05-27 DIAGNOSIS — F1010 Alcohol abuse, uncomplicated: Secondary | ICD-10-CM | POA: Diagnosis not present

## 2016-05-27 DIAGNOSIS — F1910 Other psychoactive substance abuse, uncomplicated: Secondary | ICD-10-CM | POA: Diagnosis not present

## 2016-05-27 DIAGNOSIS — F17200 Nicotine dependence, unspecified, uncomplicated: Secondary | ICD-10-CM | POA: Diagnosis not present

## 2016-05-27 DIAGNOSIS — R45851 Suicidal ideations: Secondary | ICD-10-CM | POA: Insufficient documentation

## 2016-05-27 DIAGNOSIS — Z5181 Encounter for therapeutic drug level monitoring: Secondary | ICD-10-CM | POA: Insufficient documentation

## 2016-05-27 DIAGNOSIS — F10120 Alcohol abuse with intoxication, uncomplicated: Secondary | ICD-10-CM | POA: Diagnosis not present

## 2016-05-27 DIAGNOSIS — F329 Major depressive disorder, single episode, unspecified: Secondary | ICD-10-CM | POA: Insufficient documentation

## 2016-05-27 DIAGNOSIS — F1410 Cocaine abuse, uncomplicated: Secondary | ICD-10-CM

## 2016-05-27 DIAGNOSIS — F10920 Alcohol use, unspecified with intoxication, uncomplicated: Secondary | ICD-10-CM

## 2016-05-27 HISTORY — DX: Cocaine abuse, uncomplicated: F14.10

## 2016-05-27 HISTORY — DX: Alcohol abuse, uncomplicated: F10.10

## 2016-05-27 LAB — COMPREHENSIVE METABOLIC PANEL
ALK PHOS: 83 U/L (ref 38–126)
ALT: 36 U/L (ref 17–63)
AST: 46 U/L — AB (ref 15–41)
Albumin: 4.6 g/dL (ref 3.5–5.0)
Anion gap: 9 (ref 5–15)
BILIRUBIN TOTAL: 0.5 mg/dL (ref 0.3–1.2)
BUN: 11 mg/dL (ref 6–20)
CALCIUM: 8.8 mg/dL — AB (ref 8.9–10.3)
CO2: 22 mmol/L (ref 22–32)
CREATININE: 0.64 mg/dL (ref 0.61–1.24)
Chloride: 106 mmol/L (ref 101–111)
Glucose, Bld: 106 mg/dL — ABNORMAL HIGH (ref 65–99)
Potassium: 3.9 mmol/L (ref 3.5–5.1)
Sodium: 137 mmol/L (ref 135–145)
TOTAL PROTEIN: 7.8 g/dL (ref 6.5–8.1)

## 2016-05-27 LAB — URINE DRUG SCREEN, QUALITATIVE (ARMC ONLY)
Amphetamines, Ur Screen: NOT DETECTED
BARBITURATES, UR SCREEN: NOT DETECTED
Benzodiazepine, Ur Scrn: NOT DETECTED
CANNABINOID 50 NG, UR ~~LOC~~: NOT DETECTED
Cocaine Metabolite,Ur ~~LOC~~: POSITIVE — AB
MDMA (ECSTASY) UR SCREEN: NOT DETECTED
Methadone Scn, Ur: NOT DETECTED
Opiate, Ur Screen: NOT DETECTED
PHENCYCLIDINE (PCP) UR S: NOT DETECTED
TRICYCLIC, UR SCREEN: NOT DETECTED

## 2016-05-27 LAB — CBC
HCT: 47.7 % (ref 40.0–52.0)
Hemoglobin: 16.7 g/dL (ref 13.0–18.0)
MCH: 35 pg — AB (ref 26.0–34.0)
MCHC: 34.9 g/dL (ref 32.0–36.0)
MCV: 100.1 fL — AB (ref 80.0–100.0)
PLATELETS: 175 10*3/uL (ref 150–440)
RBC: 4.76 MIL/uL (ref 4.40–5.90)
RDW: 16.2 % — ABNORMAL HIGH (ref 11.5–14.5)
WBC: 9.4 10*3/uL (ref 3.8–10.6)

## 2016-05-27 LAB — SALICYLATE LEVEL

## 2016-05-27 LAB — ACETAMINOPHEN LEVEL: Acetaminophen (Tylenol), Serum: <10 ug/mL — ABNORMAL LOW (ref 10–30)

## 2016-05-27 LAB — ETHANOL: ALCOHOL ETHYL (B): 263 mg/dL — AB (ref <5)

## 2016-05-27 MED ORDER — LORAZEPAM 2 MG PO TABS
0.0000 mg | ORAL_TABLET | Freq: Four times a day (QID) | ORAL | Status: DC
  Administered 2016-05-27 (×2): 2 mg via ORAL
  Filled 2016-05-27 (×2): qty 1

## 2016-05-27 MED ORDER — LORAZEPAM 2 MG PO TABS: 0.0000 mg | ORAL_TABLET | Freq: Two times a day (BID) | ORAL | Status: DC

## 2016-05-27 MED ORDER — THIAMINE HCL 100 MG/ML IJ SOLN: 100.0000 mg | Freq: Every day | INTRAMUSCULAR | Status: DC

## 2016-05-27 MED ORDER — VITAMIN B-1 100 MG PO TABS
100.0000 mg | ORAL_TABLET | Freq: Every day | ORAL | Status: DC
  Administered 2016-05-27: 100 mg via ORAL
  Filled 2016-05-27: qty 1

## 2016-05-27 NOTE — Consult Note (Signed)
  Psychiatry: Attempted interview patient. He refused to wake up enough to interact with me. Labs reviewed. I will continue attempts to evaluate the patient at a later time.

## 2016-05-27 NOTE — ED Notes (Signed)
States he has no one to pick him up for D/C.  Options discussed.  SW informed.

## 2016-05-27 NOTE — ED Notes (Signed)
Pt resting.

## 2016-05-27 NOTE — ED Provider Notes (Signed)
[facility] Emergency Department Provider Note    First MD Initiated Contact with Patient 05/27/16 0425     (approximate)  I have reviewed the triage vital signs and the nursing notes.   HISTORY  Chief Complaint Psychiatric Evaluation    HPI Alex [name] is a 40 y.o. male presents in police custody with feelings of depression and suicidal ideation. Patient states that he drank approximately 3-4  40 ounce beers tonight as well as using cocaine. Patient states that he's had suicidal ideation without plan over the past couple weeks. Patient admits to previous suicide attempts via overdose.   Past Medical History:  Diagnosis Date  . Alcohol abuse   . Anxiety     There are no active problems to display for this patient.   History reviewed. No pertinent surgical history.  Prior to Admission medications   Medication Sig Start Date End Date Taking? Authorizing Provider  citalopram (CELEXA) 40 MG tablet Take 40 mg by mouth daily.    Historical Provider, MD    Allergies Peanut butter flavor  No family history on file.  Social History Social History  Substance Use Topics  . Smoking status: Current Every Day Smoker    Packs/day: 0.50  . Smokeless tobacco: Never Used  . Alcohol use Yes    Review of Systems Constitutional: No fever/chills Eyes: No visual changes. ENT: No sore throat. Cardiovascular: Denies chest pain. Respiratory: Denies shortness of breath. Gastrointestinal: No abdominal pain.  No nausea, no vomiting.  No diarrhea.  No constipation. Genitourinary: Negative for dysuria. Musculoskeletal: Negative for back pain. Skin: Negative for rash. Neurological: Negative for headaches, focal weakness or numbness. Psychiatric:Positive for suicidal ideation and feelings of depression  10-point ROS otherwise negative.  ____________________________________________   PHYSICAL EXAM:  VITAL SIGNS: ED Triage Vitals [05/27/16 0409]  Enc  Vitals Group     BP 115/76     Pulse Rate 70     Resp 18     Temp 97.7 F (36.5 C)     Temp Source Oral     SpO2 96 %     Weight 200 lb (90.7 kg)     Height 5\' 6"  (1.676 m)     Head Circumference      Peak Flow      Pain Score      Pain Loc      Pain Edu?      Excl. in GC?     Constitutional: Alert and oriented. Well appearing and in no acute distress. Eyes: Conjunctivae are normal. PERRL. EOMI. Head: Atraumatic. Nose: No congestion/rhinnorhea. Mouth/Throat: Mucous membranes are moist.  Oropharynx non-erythematous. Neck: No stridor.  No meningeal signs. Cardiovascular: Normal rate, regular rhythm. Good peripheral circulation. Grossly normal heart sounds. Respiratory: Normal respiratory effort.  No retractions. Lungs CTAB. Gastrointestinal: Soft and nontender. No distention.  Musculoskeletal: No lower extremity tenderness nor edema. No gross deformities of extremities. Neurologic:  Normal speech and language. No gross focal neurologic deficits are appreciated.  Skin:  Skin is warm, dry and intact. No rash noted. Psychiatric: Depressed affect. Speech and behavior are normal.  ____________________________________________   LABS (all labs ordered are listed, but only abnormal results are displayed)  Labs Reviewed  COMPREHENSIVE METABOLIC PANEL - Abnormal; Notable for the following:       Result Value   Glucose, Bld 106 (*)    Calcium 8.8 (*)    AST 46 (*)    All other components within normal limits  ETHANOL - Abnormal; Notable for the following:    Alcohol, Ethyl (B) 263 (*)    All other components within normal limits  ACETAMINOPHEN LEVEL - Abnormal; Notable for the following:    Acetaminophen (Tylenol), Serum <10 (*)    All other components within normal limits  CBC - Abnormal; Notable for the following:    MCV 100.1 (*)    MCH 35.0 (*)    RDW 16.2 (*)    All other components within normal limits  URINE DRUG SCREEN, QUALITATIVE ([facility] ONLY) - Abnormal; Notable for  the following:    Cocaine Metabolite,Ur [place] POSITIVE (*)    All other components within normal limits  SALICYLATE LEVEL      Procedures     INITIAL IMPRESSION / ASSESSMENT AND PLAN / ED COURSE  Pertinent labs & imaging results that were available during my care of the patient were reviewed by me and considered in my medical decision making (see chart for details).  Patient with polysubstance abuse and suicidal ideation. Await psychiatry consultation.   Clinical Course    ____________________________________________  FINAL CLINICAL IMPRESSION(S) / ED DIAGNOSES  Final diagnoses:  Suicidal ideation  Alcohol intoxication, uncomplicated (HCC)  Polysubstance abuse     MEDICATIONS GIVEN DURING THIS VISIT:  Medications - No data to display   NEW OUTPATIENT MEDICATIONS STARTED DURING THIS VISIT:  New Prescriptions   No medications on file      Note:  This document was prepared using Dragon voice recognition software and may include unintentional dictation errors.    Darci Current, MD 05/27/16 [phone]

## 2016-05-27 NOTE — ED Notes (Signed)
Pass off to nightshift RN.

## 2016-05-27 NOTE — BH Assessment (Signed)
Assessment Note  Alex [name] is an 40 y.o. male presenting to the ED via police department for concerns with depression and alcohol/drug abuse.  Pt stated he requested to come to the ED because "I just want to stay for a couple days". He reports he drinks alcohol every day and started using cocaine yesterday.    Patient reports aving SI thoughts and HI thoughts for some people but no intent or plan. He says that he has been compliant with medications, but recently ran out of trazodone.    Diagnosis: Drug Abuse, Alcohol Abuse  Past Medical History:  Past Medical History:  Diagnosis Date  . Alcohol abuse   . Anxiety     History reviewed. No pertinent surgical history.  Family History: No family history on file.  Social History:  reports that he has been smoking.  He has been smoking about 0.50 packs per day. He has never used smokeless tobacco. He reports that he drinks alcohol. He reports that he uses drugs, including Marijuana.  Additional Social History:  Alcohol / Drug Use History of alcohol / drug use?: Yes (Pt has a history of alcohol and cocaine use.) Longest period of sobriety (when/how long): can't remember Negative Consequences of Use: Financial, Personal relationships, Work / School  CIWA: CIWA-Ar BP: 100/67 Pulse Rate: 64 COWS:    Allergies:  Allergies  Allergen Reactions  . Peanut Butter Flavor     Home Medications:  (Not in a hospital admission)  OB/GYN Status:  No LMP for male patient.  General Assessment Data [facility] Assessment: In system Is this a Tele or Face-to-Face Assessment?: Face-to-Face Is this an Initial Assessment or a Re-assessment for this encounter?: Initial Assessment Marital status: Single Maiden name: na Is patient pregnant?: No Pregnancy Status: No Living Arrangements: Alone Can pt return to current living arrangement?: Yes Admission Status: Voluntary Is patient capable of signing voluntary admission?:  Yes Referral Source: Self/Family/Friend Insurance type: Medicaid  Medical Screening Exam [facility] ONLY) Medical Exam completed: Yes  Crisis Care Plan Living Arrangements: Alone Legal Guardian: Other: (self) Name of Psychiatrist: na Name of Therapist: na  Education Status Is patient currently in school?: No Current Grade: na Highest grade of school patient has completed: na Name of school: na Contact person: na  Risk to self with the past 6 months Suicidal Ideation: Yes-Currently Present Has patient been a risk to self within the past 6 months prior to admission? : No Suicidal Intent: No Has patient had any suicidal intent within the past 6 months prior to admission? : No Is patient at risk for suicide?: No Suicidal Plan?: No Has patient had any suicidal plan within the past 6 months prior to admission? : No Access to Means: Yes Specify Access to Suicidal Means: Pt has access to drugs What has been your use of drugs/alcohol within the last 12 months?: alcohol and cocaine Previous Attempts/Gestures: Yes How many times?: 1 Other Self Harm Risks: None identified Triggers for Past Attempts: None known Intentional Self Injurious Behavior: None Family Suicide History: No Recent stressful life event(s): Other (Comment) Persecutory voices/beliefs?: No Depression: Yes Depression Symptoms: Loss of interest in usual pleasures, Feeling worthless/self pity Substance abuse history and/or treatment for substance abuse?: Yes Suicide prevention information given to non-admitted patients: Not applicable  Risk to Others within the past 6 months Homicidal Ideation: No Does patient have any lifetime risk of violence toward others beyond the six months prior to admission? : No Thoughts  of Harm to Others: No Current Homicidal Intent: No Current Homicidal Plan: No Access to Homicidal Means: No Identified Victim: none identified History of harm to others?: No Assessment of Violence:  None Noted Violent Behavior Description: none identified Does patient have access to weapons?: No Criminal Charges Pending?: No Does patient have a court date: No Is patient on probation?: No  Psychosis Hallucinations: None noted Delusions: None noted  Mental Status Report Appearance/Hygiene: In scrubs, Unremarkable Eye Contact: Fair Motor Activity: Freedom of movement Speech: Logical/coherent Level of Consciousness: Drowsy Mood: Depressed Affect: Appropriate to circumstance, Depressed Anxiety Level: Minimal Thought Processes: Relevant Judgement: Partial Orientation: Person, Place, Time, Situation Obsessive Compulsive Thoughts/Behaviors: None  Cognitive Functioning Concentration: Normal Memory: Recent Intact, Remote Intact IQ: Average Insight: Fair Impulse Control: Fair Appetite: Fair Sleep: No Change Vegetative Symptoms: None  ADLScreening [facility] Assessment Services) Patient's cognitive ability adequate to safely complete daily activities?: Yes Patient able to express need for assistance with ADLs?: Yes Independently performs ADLs?: Yes (appropriate for developmental age)  Prior Inpatient Therapy Prior Inpatient Therapy: No Prior Therapy Dates: na Prior Therapy Facilty/Provider(s): na Reason for Treatment: na  Prior Outpatient Therapy Prior Outpatient Therapy: No Prior Therapy Dates: na Prior Therapy Facilty/Provider(s): na Reason for Treatment: na Does patient have an ACCT team?: No Does patient have Intensive In-House Services?  : No Does patient have [facility] services? : No Does patient have P4CC services?: No  ADL Screening (condition at time of admission) Patient's cognitive ability adequate to safely complete daily activities?: Yes Patient able to express need for assistance with ADLs?: Yes Independently performs ADLs?: Yes (appropriate for developmental age)       Abuse/Neglect Assessment (Assessment to be complete while patient is alone) Physical  Abuse: Denies Verbal Abuse: Denies Sexual Abuse: Denies Exploitation of patient/patient's resources: Denies Self-Neglect: Denies Values / Beliefs Cultural Requests During Hospitalization: None Spiritual Requests During Hospitalization: None Consults Spiritual Care Consult Needed: No Social Work Consult Needed: No [employer]chant [employer] officer (For Healthcare) Does patient have an advance directive?: No Would patient like information on creating an advanced directive?: No - patient declined information    Additional Information 1:1 In Past 12 Months?: No CIRT Risk: No Elopement Risk: No Does patient have medical clearance?: Yes     Disposition:  Disposition Initial Assessment Completed for this Encounter: Yes Disposition of Patient: Other dispositions Other disposition(s): Other (Comment) (Pending Psych MD consult)  On Site Evaluation by:   Reviewed with Physician:    Alex [name] Alex [name] 05/27/2016 7:01 AM

## 2016-05-27 NOTE — ED Triage Notes (Addendum)
Pt brought in by police, pt reports is feeling depressed or suicidal. Pt reports "I just want to stay for a couple of days I just want to be left alone." Pt intoxicated, pt reports drinking 3-4 (40s), states last drink was about an hour ago. Pt states he drinks beer everyday. Pt reports used cocaine yesterday at 3p-4p. Pt is calm and cooperative.

## 2016-05-27 NOTE — Discharge Instructions (Signed)
You have been seen in the Emergency Department (ED)  today for a psychiatric complaint.  You have been evaluated by psychiatry and we believe you are safe to be discharged from the hospital.   ° °Please return to the Emergency Department (ED)  immediately if you have ANY thoughts of hurting yourself or anyone else, so that we may help you. ° °Please avoid alcohol and drug use. ° °Follow up with your doctor and/or therapist as soon as possible regarding today's ED  visit.  ° °You may call crisis hotline for [place] at [phone]. ° °

## 2016-05-27 NOTE — Consult Note (Signed)
[facility] Psychiatry Consult   Reason for Consult:  Consult for 40 year old man who presented to the emergency room intoxicated Referring Physician:  Cinda Quest Patient Identification: Alex [name] MRN:  [id number] Principal Diagnosis: Alcohol abuse Diagnosis:   Patient Active Problem List   Diagnosis Date Noted  . Alcohol abuse [F10.10] 05/27/2016  . Cocaine abuse [F14.10] 05/27/2016    Total Time spent with patient: 1 hour  Subjective:   Alex [name] is a 40 y.o. male patient admitted with "I've been drinking".  HPI:  Patient interviewed. Chart reviewed. 40 year old man came to the emergency room saying he wanted help with alcohol and cocaine use. He was somnolent earlier today and not cooperative. On interview today he said that he's been drinking heavily recently. Can't remember how much. Also using cocaine over the last couple days. Mood is been feeling mildly depressed. Denies any suicidal ideation. He says he normally takes citalopram for depression that he gets from his primary care doctor. Doesn't report any particular new stress. Patient has now woken up and is asking to be released.  Medical history: Denies being aware of any significant medical problems.  Social history: Says he lives with his mother out and [place]. Doesn't work. Doesn't have much activity.  Substance abuse history: Long history of abuse of alcohol and drugs. Has had some sobriety in the past. Went to the alcohol and drug abuse treatment center several months ago.  Past Psychiatric History: Patient denies any history of suicide attempts or violence or psychotic symptoms. Was being prescribed citalopram and trazodone by primary care doctor. Positive previous admissions mostly for substance abuse  Risk to Self: Suicidal Ideation: Yes-Currently Present Suicidal Intent: No Is patient at risk for suicide?: No Suicidal Plan?: No Access to Means: Yes Specify Access to Suicidal Means: Pt has access to  drugs What has been your use of drugs/alcohol within the last 12 months?: alcohol and cocaine How many times?: 1 Other Self Harm Risks: None identified Triggers for Past Attempts: None known Intentional Self Injurious Behavior: None Risk to Others: Homicidal Ideation: No Thoughts of Harm to Others: No Current Homicidal Intent: No Current Homicidal Plan: No Access to Homicidal Means: No Identified Victim: none identified History of harm to others?: No Assessment of Violence: None Noted Violent Behavior Description: none identified Does patient have access to weapons?: No Criminal Charges Pending?: No Does patient have a court date: No Prior Inpatient Therapy: Prior Inpatient Therapy: No Prior Therapy Dates: na Prior Therapy Facilty/Provider(s): na Reason for Treatment: na Prior Outpatient Therapy: Prior Outpatient Therapy: No Prior Therapy Dates: na Prior Therapy Facilty/Provider(s): na Reason for Treatment: na Does patient have an ACCT team?: No Does patient have Intensive In-House Services?  : No Does patient have [facility] services? : No Does patient have P4CC services?: No  Past Medical History:  Past Medical History:  Diagnosis Date  . Alcohol abuse   . Anxiety    History reviewed. No pertinent surgical history. Family History: No family history on file. Family Psychiatric  History: Denies knowing of any family history of mental illness or substance abuse problems Social History:  History  Alcohol Use  . Yes     History  Drug Use  . Types: Marijuana    Social History   Social History  . Marital status: Single    Spouse name: N/A  . Number of children: N/A  . Years of education: N/A   Social History Main Topics  . Smoking status: Current Every  Day Smoker    Packs/day: 0.50  . Smokeless tobacco: Never Used  . Alcohol use Yes  . Drug use:     Types: Marijuana  . Sexual activity: Not Asked   Other Topics Concern  . None   Social History Narrative  .  None   Additional Social History:    Allergies:   Allergies  Allergen Reactions  . Peanut Butter Flavor     Labs:  Results for orders placed or performed during the hospital encounter of 05/27/16 (from the past 48 hour(s))  Comprehensive metabolic panel     Status: Abnormal   Collection Time: 05/27/16  4:12 AM  Result Value Ref Range   Sodium 137 135 - 145 mmol/L   Potassium 3.9 3.5 - 5.1 mmol/L   Chloride 106 101 - 111 mmol/L   CO2 22 22 - 32 mmol/L   Glucose, Bld 106 (H) 65 - 99 mg/dL   BUN 11 6 - 20 mg/dL   Creatinine, Ser 0.64 0.61 - 1.24 mg/dL   Calcium 8.8 (L) 8.9 - 10.3 mg/dL   Total Protein 7.8 6.5 - 8.1 g/dL   Albumin 4.6 3.5 - 5.0 g/dL   AST 46 (H) 15 - 41 U/L   ALT 36 17 - 63 U/L   Alkaline Phosphatase 83 38 - 126 U/L   Total Bilirubin 0.5 0.3 - 1.2 mg/dL   GFR calc non Af Amer >60 >60 mL/min   GFR calc Af Amer >60 >60 mL/min    Comment: (NOTE) The eGFR has been calculated using the CKD EPI equation. This calculation has not been validated in all clinical situations. eGFR's persistently <60 mL/min signify possible Chronic Kidney Disease.    Anion gap 9 5 - 15  Ethanol     Status: Abnormal   Collection Time: 05/27/16  4:12 AM  Result Value Ref Range   Alcohol, Ethyl (B) 263 (H) <5 mg/dL    Comment:        LOWEST DETECTABLE LIMIT FOR SERUM ALCOHOL IS 5 mg/dL FOR MEDICAL PURPOSES ONLY   Salicylate level     Status: None   Collection Time: 05/27/16  4:12 AM  Result Value Ref Range   Salicylate Lvl <0.1 2.8 - 30.0 mg/dL  Acetaminophen level     Status: Abnormal   Collection Time: 05/27/16  4:12 AM  Result Value Ref Range   Acetaminophen (Tylenol), Serum <10 (L) 10 - 30 ug/mL    Comment:        THERAPEUTIC CONCENTRATIONS VARY SIGNIFICANTLY. A RANGE OF 10-30 ug/mL MAY BE AN EFFECTIVE CONCENTRATION FOR MANY PATIENTS. HOWEVER, SOME ARE BEST TREATED AT CONCENTRATIONS OUTSIDE THIS RANGE. ACETAMINOPHEN CONCENTRATIONS >150 ug/mL AT 4 HOURS  AFTER INGESTION AND >50 ug/mL AT 12 HOURS AFTER INGESTION ARE OFTEN ASSOCIATED WITH TOXIC REACTIONS.   cbc     Status: Abnormal   Collection Time: 05/27/16  4:12 AM  Result Value Ref Range   WBC 9.4 3.8 - 10.6 K/uL   RBC 4.76 4.40 - 5.90 MIL/uL   Hemoglobin 16.7 13.0 - 18.0 g/dL   HCT 47.7 40.0 - 52.0 %   MCV 100.1 (H) 80.0 - 100.0 fL   MCH 35.0 (H) 26.0 - 34.0 pg   MCHC 34.9 32.0 - 36.0 g/dL   RDW 16.2 (H) 11.5 - 14.5 %   Platelets 175 150 - 440 K/uL  Urine Drug Screen, Qualitative     Status: Abnormal   Collection Time: 05/27/16  4:12 AM  Result Value Ref  Range   Tricyclic, Ur Screen NONE DETECTED NONE DETECTED   Amphetamines, Ur Screen NONE DETECTED NONE DETECTED   MDMA (Ecstasy)Ur Screen NONE DETECTED NONE DETECTED   Cocaine Metabolite,Ur [place] POSITIVE (A) NONE DETECTED   Opiate, Ur Screen NONE DETECTED NONE DETECTED   Phencyclidine (PCP) Ur S NONE DETECTED NONE DETECTED   Cannabinoid 50 Ng, Ur  NONE DETECTED NONE DETECTED   Barbiturates, Ur Screen NONE DETECTED NONE DETECTED   Benzodiazepine, Ur Scrn NONE DETECTED NONE DETECTED   Methadone Scn, Ur NONE DETECTED NONE DETECTED    Comment: (NOTE) 248  Tricyclics, urine               Cutoff 1000 ng/mL 200  Amphetamines, urine             Cutoff 1000 ng/mL 300  MDMA (Ecstasy), urine           Cutoff 500 ng/mL 400  Cocaine Metabolite, urine       Cutoff 300 ng/mL 500  Opiate, urine                   Cutoff 300 ng/mL 600  Phencyclidine (PCP), urine      Cutoff 25 ng/mL 700  Cannabinoid, urine              Cutoff 50 ng/mL 800  Barbiturates, urine             Cutoff 200 ng/mL 900  Benzodiazepine, urine           Cutoff 200 ng/mL 1000 Methadone, urine                Cutoff 300 ng/mL 1100 1200 The urine drug screen provides only a preliminary, unconfirmed 1300 analytical test result and should not be used for non-medical 1400 purposes. Clinical consideration and professional judgment should 1500 be applied to any positive drug  screen result due to possible 1600 interfering substances. A more specific alternate chemical method 1700 must be used in order to obtain a confirmed analytical result.  1800 Gas chromato graphy / mass spectrometry (GC/MS) is the preferred 1900 confirmatory method.     Current Facility-Administered Medications  Medication Dose Route Frequency Provider Last Rate Last Dose  . LORazepam (ATIVAN) tablet 0-4 mg  0-4 mg Oral Q6H Schuyler Amor, MD   2 mg at 05/27/16 1126   Followed by  . [START ON 05/29/2016] LORazepam (ATIVAN) tablet 0-4 mg  0-4 mg Oral Q12H Schuyler Amor, MD      . thiamine (VITAMIN B-1) tablet 100 mg  100 mg Oral Daily Schuyler Amor, MD   100 mg at 05/27/16 2500   Or  . thiamine (B-1) injection 100 mg  100 mg Intravenous Daily Schuyler Amor, MD       Current Outpatient Prescriptions  Medication Sig Dispense Refill  . citalopram (CELEXA) 40 MG tablet Take 40 mg by mouth daily.      Musculoskeletal: Strength & Muscle Tone: within normal limits Gait & Station: normal Patient leans: N/A  Psychiatric Specialty Exam: Physical Exam  Nursing note and vitals reviewed. Constitutional: He appears well-developed and well-nourished.  HENT:  Head: Normocephalic and atraumatic.  Eyes: Conjunctivae are normal. Pupils are equal, round, and reactive to light.  Neck: Normal range of motion.  Cardiovascular: Regular rhythm and normal heart sounds.   Respiratory: Effort normal. No respiratory distress.  GI: Soft.  Musculoskeletal: Normal range of motion.  Neurological: He is alert.  Skin: Skin is  warm and dry.  Psychiatric: His speech is normal and behavior is normal. Judgment and thought content normal. His affect is blunt. He exhibits abnormal recent memory.    Review of Systems  Constitutional: Negative.   HENT: Negative.   Eyes: Negative.   Respiratory: Negative.   Cardiovascular: Negative.   Gastrointestinal: Negative.   Musculoskeletal: Negative.   Skin:  Negative.   Neurological: Negative.   Psychiatric/Behavioral: Positive for memory loss and substance abuse. Negative for depression, hallucinations and suicidal ideas. The patient is not nervous/anxious and does not have insomnia.     Blood pressure (!) 93/57, pulse 73, temperature 97.7 F (36.5 C), temperature source Oral, resp. rate 16, height 5' 6"  (1.676 m), weight 90.7 kg (200 lb), SpO2 100 %.Body mass index is 32.28 kg/m.  General Appearance: Disheveled  Eye Contact:  Fair  Speech:  Garbled  Volume:  Decreased  Mood:  Euthymic  Affect:  Constricted  Thought Process:  Goal Directed  Orientation:  Full (Time, Place, and Person)  Thought Content:  Logical  Suicidal Thoughts:  No  Homicidal Thoughts:  No  Memory:  Immediate;   Fair Recent;   Fair Remote;   Fair  Judgement:  Fair  Insight:  Fair  Psychomotor Activity:  Decreased  Concentration:  Concentration: Fair  Recall:  [employer] of Knowledge:  Fair  Language:  Fair  Akathisia:  No  Handed:  Right  AIMS (if indicated):     Assets:  Desire for Improvement Housing Physical Health  ADL's:  Intact  Cognition:  WNL  Sleep:        Treatment Plan Summary: Plan Patient is now sober. Not displaying any dangerous or aggressive behavior. Denies suicidal or homicidal ideation. Patient can be released from the emergency room and will follow-up with [facility] or local providers for substance abuse treatment.  Disposition: Patient does not meet criteria for psychiatric inpatient admission.  Alethia Berthold, MD 05/27/2016 6:08 PM

## 2016-05-27 NOTE — ED Provider Notes (Signed)
-----------------------------------------   6:56 PM on 05/27/2016 -----------------------------------------   Blood pressure (!) 93/57, pulse 73, temperature 97.7 F (36.5 C), temperature source Oral, resp. rate 16, height 5\' 6"  (1.676 m), weight 200 lb (90.7 kg), SpO2 100 %.  Patient evaluated by psychiatry and cleared for discharge. Patient is now sober and no longer suicidal. Labs reviewed the prior to discharge. We'll discharge patient at this time.   Nita Sicklearolina Solimar Maiden, MD 05/27/16 [phone]

## 2016-05-27 NOTE — ED Notes (Signed)
Requesting d/c. Denies SI/HI.  Report to SW/TTS.  Awaiting consulting provider.  Snack/fluids given.

## 2016-05-27 NOTE — ED Notes (Signed)
Pt. States "I just want to stay for a couple days".  Pt. States he drinks alcohol every day.  Pt. States using cocaine yesterday.  Pt. States having SI thoughts and HI thoughts for some people.    Pt. Unsure when the last time he was admitted downstairs and does not know dx.  Pt. Calm and cooperative at this time.  Pt. States he has been compliant with medications, but recently ran out of trazodone.  Pt. Stated he does not currently talk to psychologist.

## 2016-05-27 NOTE — ED Notes (Signed)
Pt given breakfast tray. Pt sleeping

## 2016-05-27 NOTE — ED Provider Notes (Signed)
-----------------------------------------   7:38 AM on 05/27/2016 -----------------------------------------  ----------------------------------------- 7:38 AM on 05/27/2016 -----------------------------------------   Blood pressure 100/67, pulse 64, temperature 97.7 F (36.5 C), temperature source Oral, resp. rate 16, height 5\' 6"  (1.676 m), weight 200 lb (90.7 kg), SpO2 97 %.  The patient had no acute events since last update.  Calm and cooperative at this time.  Disposition is pending Psychiatry/Behavioral Medicine team recommendations.     Jeanmarie PlantJames A McShane, MD 05/27/16 [phone]

## 2016-05-27 NOTE — ED Notes (Signed)
[facility]  Went promptly to BR with dry heaves.  PRN medication and ginger ale offered.  He states "I want to go downstairs".  When asked what type of treatment he desired, he responded "I don't know". Would neither confirm or deny SI. States he has "never followed d/c treatment plan".  States he spends all of monthly check on cocaine.  Orientation to unit routine and support offered.

## 2016-06-05 ENCOUNTER — Emergency Department: Payer: Medicaid Other

## 2016-06-05 ENCOUNTER — Emergency Department
Admission: EM | Admit: 2016-06-05 | Discharge: 2016-06-06 | Disposition: A | Discharge status: Home / Self Care | Payer: Medicaid Other | Attending: Emergency Medicine | Admitting: Emergency Medicine

## 2016-06-05 ENCOUNTER — Encounter: Payer: Self-pay | Admitting: Emergency Medicine

## 2016-06-05 DIAGNOSIS — Y9389 Activity, other specified: Secondary | ICD-10-CM | POA: Insufficient documentation

## 2016-06-05 DIAGNOSIS — F1910 Other psychoactive substance abuse, uncomplicated: Secondary | ICD-10-CM | POA: Diagnosis not present

## 2016-06-05 DIAGNOSIS — F1290 Cannabis use, unspecified, uncomplicated: Secondary | ICD-10-CM | POA: Insufficient documentation

## 2016-06-05 DIAGNOSIS — Z79899 Other long term (current) drug therapy: Secondary | ICD-10-CM | POA: Insufficient documentation

## 2016-06-05 DIAGNOSIS — F17200 Nicotine dependence, unspecified, uncomplicated: Secondary | ICD-10-CM | POA: Insufficient documentation

## 2016-06-05 DIAGNOSIS — M549 Dorsalgia, unspecified: Secondary | ICD-10-CM | POA: Insufficient documentation

## 2016-06-05 DIAGNOSIS — F10120 Alcohol abuse with intoxication, uncomplicated: Secondary | ICD-10-CM | POA: Diagnosis not present

## 2016-06-05 DIAGNOSIS — Y999 Unspecified external cause status: Secondary | ICD-10-CM | POA: Insufficient documentation

## 2016-06-05 DIAGNOSIS — R52 Pain, unspecified: Secondary | ICD-10-CM | POA: Diagnosis present

## 2016-06-05 DIAGNOSIS — Y92410 Unspecified street and highway as the place of occurrence of the external cause: Secondary | ICD-10-CM | POA: Diagnosis not present

## 2016-06-05 DIAGNOSIS — F1914 Other psychoactive substance abuse with psychoactive substance-induced mood disorder: Secondary | ICD-10-CM | POA: Diagnosis not present

## 2016-06-05 DIAGNOSIS — F10920 Alcohol use, unspecified with intoxication, uncomplicated: Secondary | ICD-10-CM

## 2016-06-05 DIAGNOSIS — F1994 Other psychoactive substance use, unspecified with psychoactive substance-induced mood disorder: Secondary | ICD-10-CM | POA: Diagnosis not present

## 2016-06-05 LAB — CBC
HEMATOCRIT: 47.1 % (ref 40.0–52.0)
Hemoglobin: 16.2 g/dL (ref 13.0–18.0)
MCH: 34.7 pg — ABNORMAL HIGH (ref 26.0–34.0)
MCHC: 34.4 g/dL (ref 32.0–36.0)
MCV: 100.8 fL — AB (ref 80.0–100.0)
PLATELETS: 167 10*3/uL (ref 150–440)
RBC: 4.68 MIL/uL (ref 4.40–5.90)
RDW: 15 % — AB (ref 11.5–14.5)
WBC: 7.6 10*3/uL (ref 3.8–10.6)
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
## 2016-06-05 NOTE — ED Notes (Signed)
Pt o2sat 92% on RA, pt placed on 2L [place]

## 2016-06-06 ENCOUNTER — Encounter: Payer: Self-pay | Admitting: Emergency Medicine

## 2016-06-06 ENCOUNTER — Emergency Department
Admission: EM | Admit: 2016-06-06 | Discharge: 2016-06-07 | Disposition: A | Discharge status: Home / Self Care | Payer: Medicaid Other | Attending: Emergency Medicine | Admitting: Emergency Medicine

## 2016-06-06 DIAGNOSIS — F1914 Other psychoactive substance abuse with psychoactive substance-induced mood disorder: Secondary | ICD-10-CM | POA: Insufficient documentation

## 2016-06-06 DIAGNOSIS — F329 Major depressive disorder, single episode, unspecified: Secondary | ICD-10-CM | POA: Insufficient documentation

## 2016-06-06 DIAGNOSIS — F1410 Cocaine abuse, uncomplicated: Secondary | ICD-10-CM | POA: Insufficient documentation

## 2016-06-06 DIAGNOSIS — R45851 Suicidal ideations: Secondary | ICD-10-CM | POA: Diagnosis present

## 2016-06-06 DIAGNOSIS — F17200 Nicotine dependence, unspecified, uncomplicated: Secondary | ICD-10-CM | POA: Diagnosis not present

## 2016-06-06 DIAGNOSIS — F1010 Alcohol abuse, uncomplicated: Secondary | ICD-10-CM | POA: Diagnosis not present

## 2016-06-06 DIAGNOSIS — F32A Depression, unspecified: Secondary | ICD-10-CM

## 2016-06-06 LAB — COMPREHENSIVE METABOLIC PANEL
ALBUMIN: 4.3 g/dL (ref 3.5–5.0)
ALT: 23 U/L (ref 17–63)
ANION GAP: 8 (ref 5–15)
AST: 31 U/L (ref 15–41)
Alkaline Phosphatase: 69 U/L (ref 38–126)
BUN: 13 mg/dL (ref 6–20)
CHLORIDE: 108 mmol/L (ref 101–111)
CO2: 24 mmol/L (ref 22–32)
Calcium: 8.9 mg/dL (ref 8.9–10.3)
Creatinine, Ser: 0.76 mg/dL (ref 0.61–1.24)
GFR calc Af Amer: >60 mL/min (ref >60)
Glucose, Bld: 97 mg/dL (ref 65–99)
POTASSIUM: 4.2 mmol/L (ref 3.5–5.1)
Sodium: 140 mmol/L (ref 135–145)
TOTAL PROTEIN: 7.5 g/dL (ref 6.5–8.1)
Total Bilirubin: 0.6 mg/dL (ref 0.3–1.2)

## 2016-06-06 LAB — SALICYLATE LEVEL: Salicylate Lvl: <4 mg/dL (ref 2.8–30.0)

## 2016-06-06 LAB — URINE DRUG SCREEN, QUALITATIVE (ARMC ONLY)
AMPHETAMINES, UR SCREEN: NOT DETECTED
BENZODIAZEPINE, UR SCRN: NOT DETECTED
Barbiturates, Ur Screen: NOT DETECTED
Cannabinoid 50 Ng, Ur ~~LOC~~: NOT DETECTED
Cocaine Metabolite,Ur ~~LOC~~: POSITIVE — AB
MDMA (ECSTASY) UR SCREEN: NOT DETECTED
METHADONE SCREEN, URINE: NOT DETECTED
Opiate, Ur Screen: NOT DETECTED
PHENCYCLIDINE (PCP) UR S: NOT DETECTED
TRICYCLIC, UR SCREEN: NOT DETECTED

## 2016-06-06 LAB — CBC
HCT: 48 % (ref 40.0–52.0)
Hemoglobin: 16.4 g/dL (ref 13.0–18.0)
MCH: 34.6 pg — ABNORMAL HIGH (ref 26.0–34.0)
MCHC: 34.1 g/dL (ref 32.0–36.0)
MCV: 101.3 fL — AB (ref 80.0–100.0)
PLATELETS: 165 10*3/uL (ref 150–440)
RBC: 4.74 MIL/uL (ref 4.40–5.90)
RDW: 15.2 % — AB (ref 11.5–14.5)
WBC: 5.6 10*3/uL (ref 3.8–10.6)

## 2016-06-06 LAB — BASIC METABOLIC PANEL
Anion gap: 11 (ref 5–15)
BUN: 12 mg/dL (ref 6–20)
CALCIUM: 8.7 mg/dL — AB (ref 8.9–10.3)
CO2: 21 mmol/L — AB (ref 22–32)
CREATININE: 0.82 mg/dL (ref 0.61–1.24)
Chloride: 108 mmol/L (ref 101–111)
GFR calc Af Amer: >60 mL/min (ref >60)
GFR calc non Af Amer: >60 mL/min (ref >60)
GLUCOSE: 92 mg/dL (ref 65–99)
Potassium: 3.9 mmol/L (ref 3.5–5.1)
Sodium: 140 mmol/L (ref 135–145)

## 2016-06-06 LAB — ACETAMINOPHEN LEVEL

## 2016-06-06 LAB — TROPONIN I: Troponin I: <0.03 ng/mL (ref <0.03)

## 2016-06-06 LAB — ETHANOL: ALCOHOL ETHYL (B): 80 mg/dL — AB (ref <5)

## 2016-06-06 MED ORDER — BUPROPION HCL ER (XL) 150 MG PO TB24
150.0000 mg | ORAL_TABLET | Freq: Every day | ORAL | Status: DC
  Administered 2016-06-06 – 2016-06-07 (×2): 150 mg via ORAL
  Filled 2016-06-06 (×2): qty 1

## 2016-06-06 MED ORDER — FOLIC ACID 1 MG PO TABS
1.0000 mg | ORAL_TABLET | Freq: Every day | ORAL | Status: DC
  Administered 2016-06-06 – 2016-06-07 (×2): 1 mg via ORAL
  Filled 2016-06-06 (×2): qty 1

## 2016-06-06 MED ORDER — SODIUM CHLORIDE 0.9 % IV BOLUS (SEPSIS)
1000.0000 mL | Freq: Once | INTRAVENOUS | Status: AC
  Administered 2016-06-06: 1000 mL via INTRAVENOUS

## 2016-06-06 MED ORDER — BUPROPION HCL 75 MG PO TABS: 75.0000 mg | ORAL_TABLET | Freq: Every day | ORAL | Status: DC
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
## 2016-06-06 NOTE — ED Notes (Signed)
Called pt's mother to see if she could pick up pt.  Per mother, she is unable to pick up pt, unable to provide contact information for anyone else who may pick up pt, and unable to pay for taxi to take pt home.  Karin GoldenLorraine [phone]

## 2016-06-06 NOTE — ED Notes (Signed)
[facility] speaking with pt at this time

## 2016-06-06 NOTE — ED Notes (Signed)
Patient is taking a shower.

## 2016-06-06 NOTE — ED Notes (Signed)
TTS at bedside. 

## 2016-06-06 NOTE — ED Notes (Signed)
Per Dr. Scotty CourtStafford, pt medically cleared but will stay in ED until more sober.  Pt moved to 12H.  Pt requested to speak to someone from mental health, Dr. Scotty CourtStafford to be informed and asked to put in consult

## 2016-06-06 NOTE — ED Notes (Signed)
Pt refused to take discharge papers, including information for [facility].  Pt offered to be escorted by this nurse to lobby and shown courtesy phone, pt refused help.  Pt informed he can stay in lobby until he can find a ride.

## 2016-06-06 NOTE — ED Notes (Signed)
Patient is transferring to room 7 from quad, report given to nurse.
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
## 2016-06-06 NOTE — ED Notes (Signed)
MD Derrill KayGoodman at pt bedside at this time

## 2016-06-06 NOTE — ED Notes (Signed)
Patient is oriented, watching football game in dayroom, Patient is safe, q 15 minute checks, camera monitoring in progress.

## 2016-06-06 NOTE — ED Notes (Signed)
Pt lying quietly in bed, NAD noted at this time

## 2016-06-06 NOTE — ED Notes (Signed)
Pt. Alert and oriented, warm and dry, in no distress. Pt. Denies HI, and AVH. Pt report having sone passive SI with plan to jump off bridge Pt. Encouraged to let nursing staff know of any concerns or needs.

## 2016-06-06 NOTE — ED Notes (Signed)
Request Wellbutrin from pharmacy
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
## 2016-06-06 NOTE — ED Triage Notes (Signed)
Pt seen here last night for ETOH and D/Ced to lobby presents saying that he is suicidal and plans to walk into traffic

## 2016-06-06 NOTE — ED Notes (Addendum)
SOC monitor at bedside for Telepsych consult

## 2016-06-06 NOTE — ED Notes (Signed)
Pt's oxygen removed by Dr. Scotty CourtStafford earlier.  Pt's o2sat currently 95% on RA
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
## 2016-06-06 NOTE — ED Provider Notes (Signed)
[facility] Emergency Department Provider Note    ____________________________________________   I have reviewed the triage vital signs and the nursing notes.   HISTORY  Chief Complaint Suicidal   History limited by: Not Limited   HPI Alex [name] is a 40 y.o. male who presents to the emergency department today with complaints of depression and suicidal ideation. The patient was seen in the emergency department last night. At that point he was intoxicated. He did request to be seen by behavioral health. He was discharged and was in the waiting room when he checked back in concern for suicidal ideation. Patient states that he would jump off of a bridge or running for traffic. He was seen by psychiatry roughly 10 days ago also with concerns for suicidal ideation. Patient is a somewhat poor historian and does not remember that conversation with psychiatry. He has not followed up with [facility]. He denies any medical complaints.    Past Medical History:  Diagnosis Date  . Alcohol abuse   . Anxiety     Patient Active Problem List   Diagnosis Date Noted  . Alcohol abuse 05/27/2016  . Cocaine abuse 05/27/2016    History reviewed. No pertinent surgical history.  Prior to Admission medications   Medication Sig Start Date End Date Taking? Authorizing Provider  citalopram (CELEXA) 40 MG tablet Take 40 mg by mouth daily.    Historical Provider, MD  Multiple Vitamin (MULTIVITAMIN) tablet Take 1 tablet by mouth daily.    Historical Provider, MD  omeprazole (PRILOSEC) 20 MG capsule Take 20 mg by mouth 2 (two) times daily before a meal.    Historical Provider, MD  traZODone (DESYREL) 150 MG tablet Take by mouth at bedtime.    Historical Provider, MD    Allergies Peanut butter flavor  History reviewed. No pertinent family history.  Social History Social History  Substance Use Topics  . Smoking status: Current Every Day Smoker    Packs/day: 0.50  . Smokeless  tobacco: Never Used  . Alcohol use Yes    Review of Systems  Constitutional: Negative for fever. Cardiovascular: Negative for chest pain. Respiratory: Negative for shortness of breath. Gastrointestinal: Negative for abdominal pain, vomiting and diarrhea. Neurological: Negative for headaches, focal weakness or numbness.  10-point ROS otherwise negative.  ____________________________________________   PHYSICAL EXAM:  VITAL SIGNS: ED Triage Vitals  Enc Vitals Group     BP 06/06/16 0723 130/86     Pulse Rate 06/06/16 0723 72     Resp --      Temp 06/06/16 0723 97.9 F (36.6 C)     Temp Source 06/06/16 0723 Oral     SpO2 06/06/16 0723 96 %     Weight --      Height --      Head Circumference --      Peak Flow --      Pain Score 06/06/16 0724 9   Constitutional: Alert and oriented. Well appearing and in no distress. Eyes: Conjunctivae are normal. Normal extraocular movements. ENT   Head: Normocephalic and atraumatic.   Nose: No congestion/rhinnorhea.   Mouth/Throat: Mucous membranes are moist.   Neck: No stridor. Hematological/Lymphatic/Immunilogical: No cervical lymphadenopathy. Cardiovascular: Normal rate, regular rhythm.  No murmurs, rubs, or gallops. Respiratory: Normal respiratory effort without tachypnea nor retractions. Breath sounds are clear and equal bilaterally. No wheezes/rales/rhonchi. Gastrointestinal: Soft and nontender. No distention.  Genitourinary: Deferred Musculoskeletal: Normal range of motion in all extremities. No lower extremity edema. Neurologic:  Normal speech and language. No gross focal neurologic deficits are appreciated.  Skin:  Skin is warm, dry and intact. No rash noted. Psychiatric: Mood and affect are normal. Speech and behavior are normal. Patient exhibits appropriate insight and judgment.  ____________________________________________    LABS (pertinent positives/negatives)  Labs Reviewed  ETHANOL - Abnormal; Notable  for the following:       Result Value   Alcohol, Ethyl (B) 80 (*)    All other components within normal limits  ACETAMINOPHEN LEVEL - Abnormal; Notable for the following:    Acetaminophen (Tylenol), Serum <10 (*)    All other components within normal limits  CBC - Abnormal; Notable for the following:    MCV 101.3 (*)    MCH 34.6 (*)    RDW 15.2 (*)    All other components within normal limits  URINE DRUG SCREEN, QUALITATIVE ([facility] ONLY) - Abnormal; Notable for the following:    Cocaine Metabolite,Ur [place] POSITIVE (*)    All other components within normal limits  COMPREHENSIVE METABOLIC PANEL  SALICYLATE LEVEL     ____________________________________________   EKG  None  ____________________________________________    RADIOLOGY  None  ____________________________________________   PROCEDURES  Procedures  ____________________________________________   INITIAL IMPRESSION / ASSESSMENT AND PLAN / ED COURSE  Pertinent labs & imaging results that were available during my care of the patient were reviewed by me and considered in my medical decision making (see chart for details).  Patient presents to the emergency department today because of concerns for suicidal ideation. Patient was just discharged from the emergency department earlier today. On exam patient does state that he has plans to walk in front of traffic or jump off ridge. Patient was seen by specialist on call who recommended inpatient admission. Given patient's statement of plan to harm himself and stated history of depression will plan on placing under IVC. ____________________________________________   FINAL CLINICAL IMPRESSION(S) / ED DIAGNOSES  Suicidal Ideation  Note: This dictation was prepared with Dragon dictation. Any transcriptional errors that result from this process are unintentional    Alex SemenGraydon Ikram Riebe, MD 06/06/16 1515

## 2016-06-06 NOTE — ED Notes (Signed)
[facility] Derrill KayGoodman, MD

## 2016-06-06 NOTE — ED Notes (Signed)
[facility]

## 2016-06-07 DIAGNOSIS — F1994 Other psychoactive substance use, unspecified with psychoactive substance-induced mood disorder: Secondary | ICD-10-CM
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
## 2016-06-07 NOTE — Progress Notes (Signed)
Spoke with pt. Per pt. Request. Patient states that he is not currently suicidal and no plans/ wants to go home, but has no transportation. Notified pt. Nurse. Glyn Gerads K. Sherlon HandingHarris, LCAS-A, LPC-A, [facility]  Counselor 06/07/2016 2:36 PM

## 2016-06-07 NOTE — Progress Notes (Signed)
CSW was asked to provide pt with a taxi voucher as he does not have a means of transportation home. CSW met with pt and requested if there were any family or friends that would be willing to pick him up from the hospital. Pt provided CSW with two numbers. CSW called Venora Maples (pt friend) [phone] and Legrand Rams (pt's mother) [phone]. Both Venora Maples and Legrand Rams are unwilling to pick pt up. CSW then asked pt where in [place] he would need to go. Pt reports that he needs a ride to [place] provided pt with two bus passes and a map of the bus routes. Pt should take the red bus to downtown [place] and then the purple bus to [place] near [facility]. Pt is agreeable to this plan. No taxi voucher necessary. CSW signing off as there are no further social work needs at this time. However, CSW is available should another need arise.  Georga Kaufmann, MSW, [place]

## 2016-06-07 NOTE — ED Notes (Signed)
Patient was discharged per order. AVS was reviewed with patient. Pt was given an opportunity to ask questions and verbalized understanding of all discharge paperwork. Belongings were returned. Patient verbalized readiness for discharge and appeared in no acute distress when escorted to lobby.

## 2016-06-07 NOTE — ED Provider Notes (Signed)
-----------------------------------------   3:55 PM on 06/07/2016 -----------------------------------------  Dr. Toni Amendlapacs of psychiatry has evaluated the patient, lifted IVC, recommends discharge with outpatient follow-up. The patient is clinically sober, no longer suicidal. DC home.   Alex DossEryka A Breeley Bischof, MD 06/07/16 [phone]

## 2016-06-07 NOTE — Progress Notes (Signed)
[facility] by Dr. Dr. Ardyth HarpsHernandez.  Attending Physician will be Dr. Jennet MaduroPucilowska.   [facility] .   Intake Paper Work has been signed and placed on patient chart.  ER staff is aware of the admission [name]( [name], ER Sect.; Dr. Roxan Hockeyobinson, ER MD; Clydie BraunKaren, Patient's Nurse & Marny LowensteinUrsula, Patient Access). Alex [name], LCAS-A, LPC-A, [facility]  Counselor 06/07/2016 11:21 AM

## 2016-06-07 NOTE — ED Notes (Signed)
Alex [name] has been cooperative this a.m. He's been up to the bathroom a few times and has eaten breakfast. He denied SI/HI/AVH but said he would feel suicidal if he were to leave. When asked how his mood is this morning, he mentioned that his throat has been sore. When asked what he thought the cause might be, he said that his throat gets sore when he starts back smoking. He declined an offer of a nicotine patch. Water was offered as well. Will continue to monitor for needs/safety.

## 2016-06-07 NOTE — Progress Notes (Signed)
Spoke with pt. This a.m. For follow up and assessment. Patient states is still suicidal with plans to jump off bridge, and denies current AVH. Eriel Dunckel K. Sherlon HandingHarris, LCAS-A, LPC-A, [facility]  Counselor 06/07/2016 9:26 AM

## 2016-06-07 NOTE — ED Provider Notes (Signed)
-----------------------------------------   7:03 AM on 06/07/2016 -----------------------------------------   Blood pressure 138/80, pulse 70, temperature 97.8 F (36.6 C), temperature source Oral, resp. rate 18, SpO2 98 %.  The patient had no acute events since last update.  Calm and cooperative at this time.  Disposition is pending Psychiatry/Behavioral Medicine team recommendations.     Sharman CheekPhillip Annalyn Blecher, MD 06/07/16 [phone]

## 2016-06-25 ENCOUNTER — Emergency Department
Admission: EM | Admit: 2016-06-25 | Discharge: 2016-06-26 | Disposition: A | Discharge status: Other Acute Inpatient Hospital | Payer: Medicaid Other | Attending: Emergency Medicine | Admitting: Emergency Medicine

## 2016-06-25 DIAGNOSIS — Z91010 Allergy to peanuts: Secondary | ICD-10-CM | POA: Insufficient documentation

## 2016-06-25 DIAGNOSIS — R45850 Homicidal ideations: Secondary | ICD-10-CM

## 2016-06-25 DIAGNOSIS — R45851 Suicidal ideations: Secondary | ICD-10-CM

## 2016-06-25 DIAGNOSIS — F10929 Alcohol use, unspecified with intoxication, unspecified: Secondary | ICD-10-CM

## 2016-06-25 DIAGNOSIS — F1994 Other psychoactive substance use, unspecified with psychoactive substance-induced mood disorder: Secondary | ICD-10-CM | POA: Diagnosis not present

## 2016-06-25 DIAGNOSIS — F17200 Nicotine dependence, unspecified, uncomplicated: Secondary | ICD-10-CM | POA: Diagnosis not present

## 2016-06-25 DIAGNOSIS — F10129 Alcohol abuse with intoxication, unspecified: Secondary | ICD-10-CM | POA: Diagnosis not present

## 2016-06-25 DIAGNOSIS — Z79899 Other long term (current) drug therapy: Secondary | ICD-10-CM | POA: Insufficient documentation

## 2016-06-25 LAB — URINE DRUG SCREEN, QUALITATIVE (ARMC ONLY)
AMPHETAMINES, UR SCREEN: NOT DETECTED
BENZODIAZEPINE, UR SCRN: NOT DETECTED
Barbiturates, Ur Screen: NOT DETECTED
COCAINE METABOLITE, UR ~~LOC~~: POSITIVE — AB
Cannabinoid 50 Ng, Ur ~~LOC~~: NOT DETECTED
MDMA (Ecstasy)Ur Screen: NOT DETECTED
METHADONE SCREEN, URINE: NOT DETECTED
OPIATE, UR SCREEN: NOT DETECTED
PHENCYCLIDINE (PCP) UR S: NOT DETECTED
Tricyclic, Ur Screen: NOT DETECTED

## 2016-06-25 LAB — CBC
HEMATOCRIT: 48.9 % (ref 40.0–52.0)
HEMOGLOBIN: 16.8 g/dL (ref 13.0–18.0)
MCH: 34.2 pg — ABNORMAL HIGH (ref 26.0–34.0)
MCHC: 34.3 g/dL (ref 32.0–36.0)
MCV: 99.7 fL (ref 80.0–100.0)
Platelets: 175 10*3/uL (ref 150–440)
RBC: 4.9 MIL/uL (ref 4.40–5.90)
RDW: 14 % (ref 11.5–14.5)
WBC: 7.6 10*3/uL (ref 3.8–10.6)

## 2016-06-25 LAB — COMPREHENSIVE METABOLIC PANEL
ALBUMIN: 4.4 g/dL (ref 3.5–5.0)
ALT: 25 U/L (ref 17–63)
ANION GAP: 11 (ref 5–15)
AST: 40 U/L (ref 15–41)
Alkaline Phosphatase: 75 U/L (ref 38–126)
BILIRUBIN TOTAL: 0.7 mg/dL (ref 0.3–1.2)
BUN: 11 mg/dL (ref 6–20)
CO2: 20 mmol/L — AB (ref 22–32)
Calcium: 9 mg/dL (ref 8.9–10.3)
Chloride: 109 mmol/L (ref 101–111)
Creatinine, Ser: 0.92 mg/dL (ref 0.61–1.24)
GFR calc non Af Amer: >60 mL/min (ref >60)
GLUCOSE: 83 mg/dL (ref 65–99)
POTASSIUM: 4 mmol/L (ref 3.5–5.1)
SODIUM: 140 mmol/L (ref 135–145)
TOTAL PROTEIN: 8 g/dL (ref 6.5–8.1)

## 2016-06-25 LAB — ACETAMINOPHEN LEVEL: Acetaminophen (Tylenol), Serum: <10 ug/mL — ABNORMAL LOW (ref 10–30)

## 2016-06-25 LAB — SALICYLATE LEVEL: Salicylate Lvl: <4 mg/dL (ref 2.8–30.0)

## 2016-06-25 LAB — ETHANOL: Alcohol, Ethyl (B): 255 mg/dL — ABNORMAL HIGH (ref <5)

## 2016-06-25 NOTE — ED Provider Notes (Signed)
[facility] Emergency Department Provider Note  ____________________________________________  Time seen: Approximately 10:41 PM  I have reviewed the triage vital signs and the nursing notes.   HISTORY  Chief Complaint Psychiatric Evaluation    HPI JAYQUAN BRADSHER is a 40 y.o. male with a history of substance-induced mood disorder and polysubstance abuse presenting for SI, HI. The patient was brought by police after having cut his right upper arm with a razor "because I don't want to be here anymore." He also reports there are multiple people that he wants to "hurt", especially someone who was him money. He denies any hallucinations or acute medical problems.   Past Medical History:  Diagnosis Date  . Alcohol abuse   . Anxiety     Patient Active Problem List   Diagnosis Date Noted  . Substance induced mood disorder (HCC) 06/07/2016  . Alcohol abuse 05/27/2016  . Cocaine abuse 05/27/2016    No past surgical history on file.  Current Outpatient Rx  . Order #: [id number] Class: Historical Med  . Order #: [id number] Class: Historical Med  . Order #: [id number] Class: Historical Med  . Order #: [id number] Class: Historical Med    Allergies Peanut butter flavor  No family history on file.  Social History Social History  Substance Use Topics  . Smoking status: Current Every Day Smoker    Packs/day: 0.50  . Smokeless tobacco: Never Used  . Alcohol use Yes    Review of Systems Constitutional: No fever/chills. Eyes: No visual changes. ENT: No sore throat. No congestion or rhinorrhea. Cardiovascular: Denies chest pain. Denies palpitations. Respiratory: Denies shortness of breath.  No cough. Gastrointestinal: No abdominal pain.  No nausea, no vomiting.  No diarrhea.  No constipation. Genitourinary: Negative for dysuria. Musculoskeletal: Negative for back pain. Skin: Negative for rash.Positive laceration to the right upper arm. Neurological: Negative  for headaches. No focal numbness, tingling or weakness.  Psychiatric:Positive for SI and HI. No hallucinations.  10-point ROS otherwise negative.  ____________________________________________   PHYSICAL EXAM:  VITAL SIGNS: ED Triage Vitals [06/25/16 2152]  Enc Vitals Group     BP (!) 122/92     Pulse Rate 80     Resp 18     Temp 97.8 F (36.6 C)     Temp Source Oral     SpO2 97 %     Weight      Height      Head Circumference      Peak Flow      Pain Score      Pain Loc      Pain Edu?      Excl. in GC?     Constitutional: Alert and oriented. Well appearing and in no acute distress. Answers questions appropriately. Eyes: Conjunctivae are normal.  EOMI. No scleral icterus. Head: Atraumatic. Nose: No congestion/rhinnorhea. Mouth/Throat: Mucous membranes are moist.  Neck: No stridor.  Supple.  No meningismus. Cardiovascular: Normal rate, regular rhythm. No murmurs, rubs or gallops.  Respiratory: Normal respiratory effort.  No accessory muscle use or retractions. Lungs CTAB.  No wheezes, rales or ronchi. Gastrointestinal: Soft, nontender and nondistended.  No guarding or rebound.  No peritoneal signs. Musculoskeletal: No LE edema.  Neurologic:  A&Ox3.  Speech is clear.  Face and smile are symmetric.  EOMI.  Moves all extremities well. Skin:  Skin is warm, dry. Superficial 7 inch linear laceration in the vertical direction on the proximal right upper extremity. Full range of motion of the right wrist elbow and  shoulder, with normal right radial pulse. Right upper extremity has normal sensation to light touch throughout. Psychiatric: Patient endorses SI and HI. No hallucinations. He has mildly pressured speech and a bizarre affect. ____________________________________________   LABS (all labs ordered are listed, but only abnormal results are displayed)  Labs Reviewed  COMPREHENSIVE METABOLIC PANEL - Abnormal; Notable for the following:       Result Value   CO2 20 (*)     All other components within normal limits  ETHANOL - Abnormal; Notable for the following:    Alcohol, Ethyl (B) 255 (*)    All other components within normal limits  ACETAMINOPHEN LEVEL - Abnormal; Notable for the following:    Acetaminophen (Tylenol), Serum <10 (*)    All other components within normal limits  CBC - Abnormal; Notable for the following:    MCH 34.2 (*)    All other components within normal limits  SALICYLATE LEVEL  URINE DRUG SCREEN, QUALITATIVE ([facility] ONLY)   ____________________________________________  EKG  Not indicated ____________________________________________  RADIOLOGY  No results found.  ____________________________________________   PROCEDURES  Procedure(s) performed: None  Procedures  Critical Care performed: No ____________________________________________   INITIAL IMPRESSION / ASSESSMENT AND PLAN / ED COURSE  Pertinent labs & imaging results that were available during my care of the patient were reviewed by me and considered in my medical decision making (see chart for details).  40 y.o. male with a history of substance-induced mood disorder presenting with SI and HI. Overall, the patient has a reassuring examination medically. His vital signs are stable, and his laboratory studies show intoxication with alcohol.  The patient is medically cleared for psychiatric disposition. He has been signed out to Dr. Marge DuncansPaul Melinda.  ____________________________________________  FINAL CLINICAL IMPRESSION(S) / ED DIAGNOSES  Final diagnoses:  Alcohol abuse  Suicidal ideation  Homicidal ideation    Clinical Course      NEW MEDICATIONS STARTED DURING THIS VISIT:  New Prescriptions   No medications on file      Rockne MenghiniAnne-Caroline Calina Patrie, MD 06/25/16 2310

## 2016-06-25 NOTE — ED Triage Notes (Addendum)
[facility].  Patient states "I just can't do it any more."  Patient states he wants to to to the 1st floor and stay there.

## 2016-06-26 ENCOUNTER — Inpatient Hospital Stay
Admission: AD | Admit: 2016-06-26 | Discharge: 2016-06-29 | DRG: 885 | Disposition: A | Discharge status: Home / Self Care | Payer: Medicaid Other | Source: Intra-hospital | Attending: Psychiatry | Admitting: Psychiatry

## 2016-06-26 ENCOUNTER — Encounter: Payer: Self-pay | Admitting: *Deleted

## 2016-06-26 DIAGNOSIS — F1994 Other psychoactive substance use, unspecified with psychoactive substance-induced mood disorder: Secondary | ICD-10-CM | POA: Diagnosis not present

## 2016-06-26 DIAGNOSIS — F329 Major depressive disorder, single episode, unspecified: Secondary | ICD-10-CM

## 2016-06-26 DIAGNOSIS — F17210 Nicotine dependence, cigarettes, uncomplicated: Secondary | ICD-10-CM | POA: Diagnosis present

## 2016-06-26 DIAGNOSIS — F10239 Alcohol dependence with withdrawal, unspecified: Secondary | ICD-10-CM | POA: Diagnosis present

## 2016-06-26 DIAGNOSIS — F1029 Alcohol dependence with unspecified alcohol-induced disorder: Secondary | ICD-10-CM

## 2016-06-26 DIAGNOSIS — F319 Bipolar disorder, unspecified: Principal | ICD-10-CM | POA: Diagnosis present

## 2016-06-26 DIAGNOSIS — Z91010 Allergy to peanuts: Secondary | ICD-10-CM

## 2016-06-26 DIAGNOSIS — Z23 Encounter for immunization: Secondary | ICD-10-CM

## 2016-06-26 DIAGNOSIS — Z79899 Other long term (current) drug therapy: Secondary | ICD-10-CM

## 2016-06-26 DIAGNOSIS — R45851 Suicidal ideations: Secondary | ICD-10-CM | POA: Diagnosis present

## 2016-06-26 DIAGNOSIS — F419 Anxiety disorder, unspecified: Secondary | ICD-10-CM | POA: Diagnosis present

## 2016-06-26 DIAGNOSIS — G4700 Insomnia, unspecified: Secondary | ICD-10-CM | POA: Diagnosis present

## 2016-06-26 DIAGNOSIS — F909 Attention-deficit hyperactivity disorder, unspecified type: Secondary | ICD-10-CM | POA: Diagnosis present

## 2016-06-26 DIAGNOSIS — Y908 Blood alcohol level of 240 mg/100 ml or more: Secondary | ICD-10-CM | POA: Diagnosis present

## 2016-06-26 DIAGNOSIS — F10129 Alcohol abuse with intoxication, unspecified: Secondary | ICD-10-CM | POA: Diagnosis not present

## 2016-06-26 DIAGNOSIS — Z9119 Patient's noncompliance with other medical treatment and regimen: Secondary | ICD-10-CM

## 2016-06-26 DIAGNOSIS — F1420 Cocaine dependence, uncomplicated: Secondary | ICD-10-CM

## 2016-06-26 DIAGNOSIS — F17200 Nicotine dependence, unspecified, uncomplicated: Secondary | ICD-10-CM

## 2016-06-26 DIAGNOSIS — R221 Localized swelling, mass and lump, neck: Secondary | ICD-10-CM

## 2016-06-26 DIAGNOSIS — F10939 Alcohol use, unspecified with withdrawal, unspecified: Secondary | ICD-10-CM

## 2016-06-26 DIAGNOSIS — F32A Depression, unspecified: Secondary | ICD-10-CM

## 2016-06-26 HISTORY — DX: Cocaine abuse, uncomplicated: F14.10

## 2016-06-26 MED ORDER — VITAMIN B-1 100 MG PO TABS
100.0000 mg | ORAL_TABLET | Freq: Every day | ORAL | Status: DC
  Administered 2016-06-27 – 2016-06-28 (×2): 100 mg via ORAL
  Filled 2016-06-26 (×2): qty 1

## 2016-06-26 MED ORDER — MAGNESIUM HYDROXIDE 400 MG/5ML PO SUSP: 30.0000 mL | Freq: Every day | ORAL | Status: DC | PRN

## 2016-06-26 MED ORDER — FOLIC ACID 1 MG PO TABS
1.0000 mg | ORAL_TABLET | Freq: Every day | ORAL | Status: DC
  Administered 2016-06-26: 1 mg via ORAL
  Filled 2016-06-26: qty 1

## 2016-06-26 MED ORDER — NICOTINE 21 MG/24HR TD PT24
21.0000 mg | MEDICATED_PATCH | Freq: Every day | TRANSDERMAL | Status: DC
  Administered 2016-06-26 – 2016-06-29 (×4): 21 mg via TRANSDERMAL
  Filled 2016-06-26 (×4): qty 1

## 2016-06-26 MED ORDER — THIAMINE HCL 100 MG/ML IJ SOLN: 100.0000 mg | Freq: Every day | INTRAMUSCULAR | Status: DC

## 2016-06-26 MED ORDER — ADULT MULTIVITAMIN W/MINERALS CH
1.0000 | ORAL_TABLET | Freq: Every day | ORAL | Status: DC
  Administered 2016-06-27 – 2016-06-29 (×3): 1 via ORAL
  Filled 2016-06-26 (×3): qty 1

## 2016-06-26 MED ORDER — FOLIC ACID 1 MG PO TABS
1.0000 mg | ORAL_TABLET | Freq: Every day | ORAL | Status: DC
  Administered 2016-06-27 – 2016-06-28 (×2): 1 mg via ORAL
  Filled 2016-06-26 (×2): qty 1

## 2016-06-26 MED ORDER — LORAZEPAM 2 MG/ML IJ SOLN: 1.0000 mg | Freq: Four times a day (QID) | INTRAMUSCULAR | Status: DC | PRN

## 2016-06-26 MED ORDER — LORAZEPAM 1 MG PO TABS: 1.0000 mg | ORAL_TABLET | Freq: Four times a day (QID) | ORAL | Status: DC | PRN

## 2016-06-26 MED ORDER — CITALOPRAM HYDROBROMIDE 20 MG PO TABS
20.0000 mg | ORAL_TABLET | Freq: Every day | ORAL | Status: DC
  Administered 2016-06-27 – 2016-06-28 (×2): 20 mg via ORAL
  Filled 2016-06-26 (×2): qty 1

## 2016-06-26 MED ORDER — TRAZODONE HCL 100 MG PO TABS
100.0000 mg | ORAL_TABLET | Freq: Every evening | ORAL | Status: DC | PRN
  Administered 2016-06-26: 100 mg via ORAL

## 2016-06-26 MED ORDER — ADULT MULTIVITAMIN W/MINERALS CH
1.0000 | ORAL_TABLET | Freq: Every day | ORAL | Status: DC
  Administered 2016-06-26: 1 via ORAL
  Filled 2016-06-26: qty 1

## 2016-06-26 MED ORDER — ALUM & MAG HYDROXIDE-SIMETH 200-200-20 MG/5ML PO SUSP: 30.0000 mL | ORAL | Status: DC | PRN

## 2016-06-26 MED ORDER — ACETAMINOPHEN 325 MG PO TABS
650.0000 mg | ORAL_TABLET | Freq: Four times a day (QID) | ORAL | Status: DC | PRN
  Administered 2016-06-27 – 2016-06-28 (×2): 650 mg via ORAL
  Filled 2016-06-26 (×2): qty 2

## 2016-06-26 MED ORDER — TRAZODONE HCL 100 MG PO TABS
100.0000 mg | ORAL_TABLET | Freq: Every evening | ORAL | Status: DC | PRN
  Administered 2016-06-27: 100 mg via ORAL
  Filled 2016-06-26: qty 1

## 2016-06-26 MED ORDER — TRAZODONE HCL 100 MG PO TABS
ORAL_TABLET | ORAL | Status: AC
  Filled 2016-06-26: qty 1

## 2016-06-26 MED ORDER — CITALOPRAM HYDROBROMIDE 20 MG PO TABS
20.0000 mg | ORAL_TABLET | Freq: Every day | ORAL | Status: DC
  Administered 2016-06-26: 20 mg via ORAL
  Filled 2016-06-26: qty 1

## 2016-06-26 MED ORDER — TRAZODONE HCL 100 MG PO TABS
100.0000 mg | ORAL_TABLET | Freq: Every evening | ORAL | Status: DC | PRN
  Filled 2016-06-26: qty 1

## 2016-06-26 MED ORDER — VITAMIN B-1 100 MG PO TABS
100.0000 mg | ORAL_TABLET | Freq: Every day | ORAL | Status: DC
  Administered 2016-06-26: 100 mg via ORAL
  Filled 2016-06-26: qty 1

## 2016-06-26 NOTE — ED Notes (Signed)
Pt up to nursing station stating he is having a hard time getting to sleep. Explained to pt that this RN would call the doctor to see if he could have something to help him sleep. Pt understanding.

## 2016-06-26 NOTE — Progress Notes (Signed)
40 year old male IVC'ed with substance induced mood disorder.  Received on unit from ED in scrubs, looking disheveled and notable body odor.  Patient animated and laughs inappropriately.  Speech tangential and fast.  Verbalizes that he here because he was going to do things to himself.  Would not elaborate on what those things were.  Denies SI at this time.  Body search and skin assessment performed.  No contraband found. Healing scratch approximately 4 inches long noted to right upper arm.  Multiple scratches that look like bug bites noted to left lower leg.  Patient oriented to unit and his room.

## 2016-06-26 NOTE — [facility] Group Notes (Signed)
[facility] Group Notes:  (Nursing/MHT/Case Management/Adjunct)  Date:  06/26/2016  Time:  10:08 PM  Type of Therapy:  Psychoeducational Skills  Participation Level:  Active  Participation Quality:  Appropriate  Affect:  Appropriate  Cognitive:  Alert  Insight:  Good  Engagement in Group:  Engaged  Modes of Intervention:  Activity  Summary of Progress/Problems:  Mayra NeerJackie L Rolla Kedzierski 06/26/2016, 10:08 PM

## 2016-06-26 NOTE — ED Notes (Signed)
Attempted therapeutic communication with patient. He is unwilling at this time.

## 2016-06-26 NOTE — BH Assessment (Signed)
Assessment Note  Alex HousemanBrian J [name] is an 40 y.o. male. Pt reports "I feel like I ain't got nothing to live for. I was getting ready to jump and my brother stopped me ... It's a lot of stuff going on with me". Pt endorses active thoughts to hurt himself. Pt's plan, intent, and means appear to be vague and malingering. Pt could not provide a clear and concrete plan of how he planned to harm himself. He currently lives with his mother [phone] [name]- [place]). Pt reports recent alcohol and cocaine use "today". He reports drinking 2 - 40oz beers and $20.00 worth of crack cocaine. Pt very adamant about "going downstairs" to unit for inpatient treatment. Pt did not report past/current hallucinations/delusions. Pt denied homicidal ideations.  Diagnosis:  Alcohol Use Disorder, Severe Cocaine Use Disorder, Severe   Past Medical History:  Past Medical History:  Diagnosis Date  . Alcohol abuse   . Anxiety     No past surgical history on file.  Family History: No family history on file.  Social History:  reports that he has been smoking.  He has been smoking about 0.50 packs per day. He has never used smokeless tobacco. He reports that he drinks alcohol. He reports that he uses drugs, including Marijuana.  Additional Social History:  Alcohol / Drug Use Pain Medications: None Reported Prescriptions: None Reported Over the Counter: None Reported History of alcohol / drug use?: Yes Longest period of sobriety (when/how long): UKN Negative Consequences of Use: Financial, Legal  CIWA: CIWA-Ar BP: (!) 122/92 Pulse Rate: 80 COWS:    Allergies:  Allergies  Allergen Reactions  . Peanut Butter Flavor     Home Medications:  (Not in a hospital admission)  OB/GYN Status:  No LMP for male patient.  General Assessment Data [facility] Assessment: In system Is this a Tele or Face-to-Face Assessment?: Face-to-Face Is this an Initial Assessment or a Re-assessment for this  encounter?: Initial Assessment Marital status: Single Maiden name: N/A Is patient pregnant?: No Pregnancy Status: No Living Arrangements: Parent (Pt lives with his mother [phone])) Can pt return to current living arrangement?: Yes Admission Status: Involuntary Is patient capable of signing voluntary admission?: No Referral Source: Self/Family/Friend Insurance type: [employer]  Medical Screening Exam [facility] Walk-in ONLY) Medical Exam completed: Yes  Crisis Care Plan Living Arrangements: Parent (Pt lives with his mother [phone])) Legal Guardian: Other: (Self) Name of Psychiatrist: N/A Name of Therapist: None  Education Status Is patient currently in school?: No Current Grade: N/A Highest grade of school patient has completed: N/A Name of school: N/A Contact person: N/A  Risk to self with the past 6 months Suicidal Ideation: Yes-Currently Present Has patient been a risk to self within the past 6 months prior to admission? : No Suicidal Intent: Yes-Currently Present Has patient had any suicidal intent within the past 6 months prior to admission? : No Is patient at risk for suicide?: Yes Suicidal Plan?: Yes-Currently Present Has patient had any suicidal plan within the past 6 months prior to admission? : No Specify Current Suicidal Plan: To jump off a bridge Access to Means: Yes Specify Access to Suicidal Means: Pt can walk to a bridge What has been your use of drugs/alcohol within the last 12 months?: alcohol and cocaine Previous Attempts/Gestures: Yes How many times?: 2 Other Self Harm Risks: None Reported Triggers for Past Attempts: None known Intentional Self Injurious Behavior: None Family Suicide History: No Recent stressful life event(s): Financial Problems,  Legal Issues Persecutory voices/beliefs?: No Depression: Yes Depression Symptoms: Feeling worthless/self pity, Guilt, Loss of interest in usual pleasures Substance abuse history and/or  treatment for substance abuse?: Yes Suicide prevention information given to non-admitted patients: Yes  Risk to Others within the past 6 months Homicidal Ideation: No Does patient have any lifetime risk of violence toward others beyond the six months prior to admission? : No Thoughts of Harm to Others: No Current Homicidal Intent: No Current Homicidal Plan: No Access to Homicidal Means: No Identified Victim: N/A History of harm to others?: No Assessment of Violence: None Noted Violent Behavior Description: None Reported Does patient have access to weapons?: No Criminal Charges Pending?: No Does patient have a court date: No Is patient on probation?: Unknown  Psychosis Hallucinations: None noted Delusions: None noted  Mental Status Report Appearance/Hygiene: In scrubs, In hospital gown Eye Contact: Good Motor Activity: Freedom of movement Speech: Logical/coherent Level of Consciousness: Alert Mood: Depressed Affect: Flat Anxiety Level: Minimal Thought Processes: Coherent, Relevant Judgement: Partial Orientation: Person, Place, Time, Situation Obsessive Compulsive Thoughts/Behaviors: None  Cognitive Functioning Concentration: Normal Memory: Recent Intact, Remote Intact IQ: Average Insight: Poor Impulse Control: Fair Appetite: Fair Weight Loss: 0 Weight Gain: 0 Sleep: Decreased Total Hours of Sleep: 4 Vegetative Symptoms: None  ADLScreening [facility] Assessment Services) Patient's cognitive ability adequate to safely complete daily activities?: Yes Patient able to express need for assistance with ADLs?: Yes Independently performs ADLs?: Yes (appropriate for developmental age)  Prior Inpatient Therapy Prior Inpatient Therapy: Yes Prior Therapy Dates: 05/2016 Prior Therapy Facilty/Provider(s): [facility] Reason for Treatment: Depression; alcohol  Prior Outpatient Therapy Prior Outpatient Therapy: No (Pt has not followed up with [facility]) Prior Therapy Dates: N/A Prior  Therapy Facilty/Provider(s): N/A Reason for Treatment: N/A Does patient have an ACCT team?: No Does patient have Intensive In-House Services?  : No Does patient have [facility] services? : No Does patient have P4CC services?: No  ADL Screening (condition at time of admission) Patient's cognitive ability adequate to safely complete daily activities?: Yes Patient able to express need for assistance with ADLs?: Yes Independently performs ADLs?: Yes (appropriate for developmental age)       Abuse/Neglect Assessment (Assessment to be complete while patient is alone) Physical Abuse: Denies Verbal Abuse: Denies Sexual Abuse: Denies Exploitation of patient/patient's resources: Denies Self-Neglect: Denies Values / Beliefs Cultural Requests During Hospitalization: None Spiritual Requests During Hospitalization: None Consults Spiritual Care Consult Needed: No Social Work Consult Needed: No Merchant navy officer (For Healthcare) Does patient have an advance directive?: No    Additional Information 1:1 In Past 12 Months?: No CIRT Risk: No Elopement Risk: No Does patient have medical clearance?: Yes  Child/Adolescent Assessment Running Away Risk: Denies Bed-Wetting: Denies Destruction of Property: Denies Cruelty to Animals: Denies Stealing: Denies Rebellious/Defies Authority: Denies Satanic Involvement: Denies Archivist: Denies Problems at [employer]: Denies Gang Involvement: Denies  Disposition:  Disposition Initial Assessment Completed for this Encounter: Yes Disposition of Patient: Referred to (Psych MD to see) Other disposition(s): Other (Comment) (Psych consult) Patient referred to: Other (Comment) (Psych MD to see)  On Site Evaluation by:   Reviewed with Physician:    Wilmon Arms 06/26/2016 1:35 AM

## 2016-06-26 NOTE — Tx Team (Signed)
Initial Treatment Plan 06/26/2016 3:43 PM Alex [name] [id number]    PATIENT STRESSORS: Substance abuse   PATIENT STRENGTHS: Active sense of humor Average or above average intelligence Supportive family/friends   PATIENT IDENTIFIED PROBLEMS: "stop drinking"                     DISCHARGE CRITERIA:  Ability to meet basic life and health needs Improved stabilization in mood, thinking, and/or behavior Motivation to continue treatment in a less acute level of care  PRELIMINARY DISCHARGE PLAN: Outpatient therapy Return to previous living arrangement  PATIENT/FAMILY INVOLVEMENT: This treatment plan has been presented to and reviewed with the patient, Alex [name], and/or family member, .  The patient and family have been given the opportunity to ask questions and make suggestions.  Elige RadonCobb, Jaylun Fleener B, RN 06/26/2016, 3:43 PM

## 2016-06-26 NOTE — BH Assessment (Signed)
[facility]  Per Charge Nurse Annapolisliff) [facility].  Writer informed the nurse working with the patient.

## 2016-06-26 NOTE — Consult Note (Signed)
[facility] Consult   Reason for Consult:  Consult for 40 year old man with a history of alcohol and drug abuse who presented to the emergency room with suicidal statements Referring Physician:  Edd Fabian Patient Identification: Alex [name] MRN:  [id number] Principal Diagnosis: Substance induced mood disorder [facility]) Diagnosis:   Patient Active Problem List   Diagnosis Date Noted  . Involuntary commitment [Z04.6] 06/26/2016  . Suicidal ideation [R45.851] 06/26/2016  . Noncompliance [Z91.19] 06/26/2016  . Substance induced mood disorder ([place]) [F19.94] 06/07/2016  . Alcohol abuse [F10.10] 05/27/2016  . Cocaine abuse [F14.10] 05/27/2016    Total Time spent with patient: 1 hour  Subjective:   Alex [name] is a 40 y.o. male patient admitted with "I just don't know what to do with myself".  HPI:  Patient interviewed. Chart reviewed. Labs and vitals reviewed. This is a 40 year old man well known to the emergency room and psychiatric service who presented saying he was having suicidal thoughts of jumping off a bridge. He apparently went [place] talk [place]. He was very intoxicated. Also admits he's been using a little cocaine. Patient as usual was not a very thoughtful or clear historian but this time is telling me that he still feels depressed today and still has suicidal thoughts. He says he is going through a huge amount of stress although he declines to be very specific about it. Says he thought about jumping off a bridge yesterday. Continues to drink daily and uses cocaine although doesn't sound like he's made any effort to try to stop. Hasn't been following up with any recommended outpatient treatment. He does admit that he has some legal problems. Won't go into any detail about it.  Social history: Doesn't work. Lives with his mother in [place]. Sounds like he's pretty limited in his activity.  Medical history: No history of specific medical diagnoses outside  of the substance abuse.  Substance abuse history: Long history of alcohol and drug abuse especially cocaine. He has been presenting to the emergency room here for years. It's been escalating in the last several months and he has had multiple visits to the ER. No known history of delirium tremens or seizures. Doesn't sound like he has really made any effort to try and stop recently and his insight is poor.  Past Psychiatric History: Patient has a history of depressed mood suicidal threats all seemingly related to his substance abuse. Not clear that he is ever really had long-standing mood symptoms when he is sober but am not sure how sober he has ever been. He does have a history of making attempts to hurt himself in the past. He has been prescribed Celexa and trazodone and claims that it helps but it doesn't sound like he has done anything to follow-up on it recently.  Risk to Self: Suicidal Ideation: Yes-Currently Present Suicidal Intent: Yes-Currently Present Is patient at risk for suicide?: Yes Suicidal Plan?: Yes-Currently Present Specify Current Suicidal Plan: To jump off a bridge Access to Means: Yes Specify Access to Suicidal Means: Pt can walk to a bridge What has been your use of drugs/alcohol within the last 12 months?: alcohol and cocaine How many times?: 2 Other Self Harm Risks: None Reported Triggers for Past Attempts: None known Intentional Self Injurious Behavior: None Risk to Others: Homicidal Ideation: No Thoughts of Harm to Others: No Current Homicidal Intent: No Current Homicidal Plan: No Access to Homicidal Means: No Identified Victim: N/A History of harm to others?:  No Assessment of Violence: None Noted Violent Behavior Description: None Reported Does patient have access to weapons?: No Criminal Charges Pending?: No Does patient have a court date: No Prior Inpatient Therapy: Prior Inpatient Therapy: Yes Prior Therapy Dates: 05/2016 Prior Therapy  Facilty/Provider(s): [facility] Reason for Treatment: Depression; alcohol Prior Outpatient Therapy: Prior Outpatient Therapy: No (Pt has not followed up with [facility]) Prior Therapy Dates: N/A Prior Therapy Facilty/Provider(s): N/A Reason for Treatment: N/A Does patient have an ACCT team?: No Does patient have Intensive In-House Services?  : No Does patient have [facility] services? : No Does patient have P4CC services?: No  Past Medical History:  Past Medical History:  Diagnosis Date  . Alcohol abuse   . Anxiety    No past surgical history on file. Family History: No family history on file. Family Psychiatric  History: Patient denies there being any family history of mental health or substance abuse problems Social History:  History  Alcohol Use  . Yes     History  Drug Use  . Types: Marijuana    Social History   Social History  . Marital status: Single    Spouse name: N/A  . Number of children: N/A  . Years of education: N/A   Social History Main Topics  . Smoking status: Current Every Day Smoker    Packs/day: 0.50  . Smokeless tobacco: Never Used  . Alcohol use Yes  . Drug use:     Types: Marijuana  . Sexual activity: Not on file   Other Topics Concern  . Not on file   Social History Narrative  . No narrative on file   Additional Social History:    Allergies:   Allergies  Allergen Reactions  . Peanut Butter Flavor     Labs:  Results for orders placed or performed during the hospital encounter of 06/25/16 (from the past 48 hour(s))  Comprehensive metabolic panel     Status: Abnormal   Collection Time: 06/25/16  9:58 PM  Result Value Ref Range   Sodium 140 135 - 145 mmol/L   Potassium 4.0 3.5 - 5.1 mmol/L   Chloride 109 101 - 111 mmol/L   CO2 20 (L) 22 - 32 mmol/L   Glucose, Bld 83 65 - 99 mg/dL   BUN 11 6 - 20 mg/dL   Creatinine, Ser 0.92 0.61 - 1.24 mg/dL   Calcium 9.0 8.9 - 10.3 mg/dL   Total Protein 8.0 6.5 - 8.1 g/dL   Albumin 4.4 3.5 - 5.0 g/dL    AST 40 15 - 41 U/L   ALT 25 17 - 63 U/L   Alkaline Phosphatase 75 38 - 126 U/L   Total Bilirubin 0.7 0.3 - 1.2 mg/dL   GFR calc non Af Amer >60 >60 mL/min   GFR calc Af Amer >60 >60 mL/min    Comment: (NOTE) The eGFR has been calculated using the CKD EPI equation. This calculation has not been validated in all clinical situations. eGFR's persistently <60 mL/min signify possible Chronic Kidney Disease.    Anion gap 11 5 - 15  Ethanol     Status: Abnormal   Collection Time: 06/25/16  9:58 PM  Result Value Ref Range   Alcohol, Ethyl (B) 255 (H) <5 mg/dL    Comment:        LOWEST DETECTABLE LIMIT FOR SERUM ALCOHOL IS 5 mg/dL FOR MEDICAL PURPOSES ONLY   Salicylate level     Status: None   Collection Time: 06/25/16  9:58 PM  Result Value Ref Range   Salicylate Lvl <2.7 2.8 - 30.0 mg/dL  Acetaminophen level     Status: Abnormal   Collection Time: 06/25/16  9:58 PM  Result Value Ref Range   Acetaminophen (Tylenol), Serum <10 (L) 10 - 30 ug/mL    Comment:        THERAPEUTIC CONCENTRATIONS VARY SIGNIFICANTLY. A RANGE OF 10-30 ug/mL MAY BE AN EFFECTIVE CONCENTRATION FOR MANY PATIENTS. HOWEVER, SOME ARE BEST TREATED AT CONCENTRATIONS OUTSIDE THIS RANGE. ACETAMINOPHEN CONCENTRATIONS >150 ug/mL AT 4 HOURS AFTER INGESTION AND >50 ug/mL AT 12 HOURS AFTER INGESTION ARE OFTEN ASSOCIATED WITH TOXIC REACTIONS.   cbc     Status: Abnormal   Collection Time: 06/25/16  9:58 PM  Result Value Ref Range   WBC 7.6 3.8 - 10.6 K/uL   RBC 4.90 4.40 - 5.90 MIL/uL   Hemoglobin 16.8 13.0 - 18.0 g/dL   HCT 48.9 40.0 - 52.0 %   MCV 99.7 80.0 - 100.0 fL   MCH 34.2 (H) 26.0 - 34.0 pg   MCHC 34.3 32.0 - 36.0 g/dL   RDW 14.0 11.5 - 14.5 %   Platelets 175 150 - 440 K/uL  Urine Drug Screen, Qualitative     Status: Abnormal   Collection Time: 06/25/16 11:31 PM  Result Value Ref Range   Tricyclic, Ur Screen NONE DETECTED NONE DETECTED   Amphetamines, Ur Screen NONE DETECTED NONE DETECTED   MDMA  (Ecstasy)Ur Screen NONE DETECTED NONE DETECTED   Cocaine Metabolite,Ur [place] POSITIVE (A) NONE DETECTED   Opiate, Ur Screen NONE DETECTED NONE DETECTED   Phencyclidine (PCP) Ur S NONE DETECTED NONE DETECTED   Cannabinoid 50 Ng, Ur [place] NONE DETECTED NONE DETECTED   Barbiturates, Ur Screen NONE DETECTED NONE DETECTED   Benzodiazepine, Ur Scrn NONE DETECTED NONE DETECTED   Methadone Scn, Ur NONE DETECTED NONE DETECTED    Comment: (NOTE) 062  Tricyclics, urine               Cutoff 1000 ng/mL 200  Amphetamines, urine             Cutoff 1000 ng/mL 300  MDMA (Ecstasy), urine           Cutoff 500 ng/mL 400  Cocaine Metabolite, urine       Cutoff 300 ng/mL 500  Opiate, urine                   Cutoff 300 ng/mL 600  Phencyclidine (PCP), urine      Cutoff 25 ng/mL 700  Cannabinoid, urine              Cutoff 50 ng/mL 800  Barbiturates, urine             Cutoff 200 ng/mL 900  Benzodiazepine, urine           Cutoff 200 ng/mL 1000 Methadone, urine                Cutoff 300 ng/mL 1100 1200 The urine drug screen provides only a preliminary, unconfirmed 1300 analytical test result and should not be used for non-medical 1400 purposes. Clinical consideration and professional judgment should 1500 be applied to any positive drug screen result due to possible 1600 interfering substances. A more specific alternate chemical method 1700 must be used in order to obtain a confirmed analytical result.  1800 Gas chromato graphy / mass spectrometry (GC/MS) is the preferred 1900 confirmatory method.     Current Facility-Administered Medications  Medication Dose Route  Frequency Provider Last Rate Last Dose  . citalopram (CELEXA) tablet 20 mg  20 mg Oral Daily Gonzella Lex, MD   20 mg at 06/26/16 1151  . folic acid (FOLVITE) tablet 1 mg  1 mg Oral Daily Gonzella Lex, MD   1 mg at 06/26/16 1151  . LORazepam (ATIVAN) tablet 1 mg  1 mg Oral Q6H PRN Gonzella Lex, MD       Or  . LORazepam (ATIVAN) injection 1 mg  1  mg Intravenous Q6H PRN Gonzella Lex, MD      . multivitamin with minerals tablet 1 tablet  1 tablet Oral Daily Gonzella Lex, MD   1 tablet at 06/26/16 1151  . thiamine (VITAMIN B-1) tablet 100 mg  100 mg Oral Daily Gonzella Lex, MD   100 mg at 06/26/16 1151   Or  . thiamine (B-1) injection 100 mg  100 mg Intravenous Daily Gonzella Lex, MD      . traZODone (DESYREL) 100 MG tablet           . traZODone (DESYREL) tablet 100 mg  100 mg Oral QHS PRN Nena Polio, MD   100 mg at 06/26/16 8937   Current Outpatient Prescriptions  Medication Sig Dispense Refill  . citalopram (CELEXA) 40 MG tablet Take 40 mg by mouth daily.    . Multiple Vitamin (MULTIVITAMIN) tablet Take 1 tablet by mouth daily.    Marland Kitchen omeprazole (PRILOSEC) 20 MG capsule Take 20 mg by mouth 2 (two) times daily before a meal.    . traZODone (DESYREL) 150 MG tablet Take by mouth at bedtime.      Musculoskeletal: Strength & Muscle Tone: within normal limits Gait & Station: unsteady Patient leans: N/A  Psychiatric Specialty Exam: Physical Exam  Nursing note and vitals reviewed. Constitutional: He appears well-developed and well-nourished.  HENT:  Head: Normocephalic and atraumatic.  Eyes: Conjunctivae are normal. Pupils are equal, round, and reactive to light.  Neck: Normal range of motion.  Cardiovascular: Regular rhythm and normal heart sounds.   Respiratory: Effort normal. No respiratory distress.  GI: Soft.  Musculoskeletal: Normal range of motion.  Neurological: He is alert.  Skin: Skin is warm and dry.  Psychiatric: His affect is blunt. His speech is delayed. He is slowed and withdrawn. He expresses impulsivity. He exhibits a depressed mood. He expresses suicidal ideation. He expresses no homicidal ideation. He exhibits abnormal recent memory.    Review of Systems  Constitutional: Negative.   HENT: Negative.   Eyes: Negative.   Respiratory: Negative.   Cardiovascular: Negative.   Gastrointestinal:  Negative.   Musculoskeletal: Negative.   Skin: Negative.   Neurological: Negative.   Psychiatric/Behavioral: Positive for depression, memory loss, substance abuse and suicidal ideas. Negative for hallucinations. The patient is nervous/anxious and has insomnia.     Blood pressure (!) 96/53, pulse 75, temperature 97.6 F (36.4 C), temperature source Oral, resp. rate 18, SpO2 98 %.There is no height or weight on file to calculate BMI.  General Appearance: Casual  Eye Contact:  Fair  Speech:  Slow  Volume:  Decreased  Mood:  Depressed  Affect:  Blunt  Thought Process:  Goal Directed  Orientation:  Full (Time, Place, and Person)  Thought Content:  Tangential  Suicidal Thoughts:  Yes.  with intent/plan  Homicidal Thoughts:  No  Memory:  Immediate;   Good Recent;   Fair Remote;   Fair  Judgement:  Impaired  Insight:  Shallow  Psychomotor Activity:  Decreased  Concentration:  Concentration: Fair  Recall:  [employer] of Knowledge:  Fair  Language:  Fair  Akathisia:  No  Handed:  Right  AIMS (if indicated):     Assets:  Desire for Improvement Housing Physical Health  ADL's:  Intact  Cognition:  Impaired,  Mild  Sleep:        Treatment Plan Summary: Daily contact with patient to assess and evaluate symptoms and progress in treatment, Medication management and Plan 40 year old man with a long history of substance abuse presents to the emergency room. He was clearly intoxicated and had a cocaine positive drug screen last night. Contrary to previous recent visits he is still saying he is suicidal today. It's possible that some of this may just be manipulative and related to his legal situation but he does have multiple factors for self injury and suicide. He will be admitted to the hospital and alcohol withdrawal orders are in place. Restart citalopram and when necessary trazodone. He can engage in groups and monitoring on the unit and perhaps this will put the brakes on what seems like  an accelerated downward course for him. He is agreeable to the plan. Case reviewed with the ER physician and TTS.  Disposition: Recommend psychiatric Inpatient admission when medically cleared. Supportive therapy provided about ongoing stressors.  Alethia Berthold, MD 06/26/2016 12:02 PM

## 2016-06-26 NOTE — ED Notes (Signed)
BEHAVIORAL HEALTH ROUNDING Patient sleeping: Yes.   Patient alert and oriented: not applicable SLEEPING Behavior appropriate: Yes.  ; If no, describe: SLEEPING Nutrition and fluids offered: No SLEEPING Toileting and hygiene offered: NoSLEEPING Sitter present: not applicable, Q 15 min safety rounds and observation via security camera. Law enforcement present: Yes ODS 

## 2016-06-26 NOTE — ED Notes (Signed)
Pt brought into ED BHU via sally port and wand with metal detector for safety by ODS officer. Patient oriented to unit/care area: Pt informed of unit policies and procedures.  Informed that, for their safety, care areas are designed for safety and monitored by security cameras at all times; and visiting hours explained to patient. Patient verbalizes understanding, and verbal contract for safety obtained.Pt shown to their room.   ENVIRONMENTAL ASSESSMENT  Potentially harmful objects out of patient reach: Yes.  Personal belongings secured: Yes.  Patient dressed in hospital provided attire only: Yes.  Plastic bags out of patient reach: Yes.  Patient care equipment (cords, cables, call bells, lines, and drains) shortened, removed, or accounted for: Yes.  Equipment and supplies removed from bottom of stretcher: Yes.  Potentially toxic materials out of patient reach: Yes.  Sharps container removed or out of patient reach: Yes.   BEHAVIORAL HEALTH ROUNDING  Patient sleeping: No.  Patient alert and oriented: yes  Behavior appropriate: Yes. ; If no, describe:  Nutrition and fluids offered: Yes  Toileting and hygiene offered: Yes  Sitter present: not applicable, Q 15 min safety rounds and observation via security camera. Law enforcement present: Yes ODS  

## 2016-06-26 NOTE — ED Provider Notes (Signed)
Patient reports he has been hoarse for some time. He also noticed today a freely mobile node about half a centimeter to the left side of the larynx is nontender. I advised him that we would follow up on this and keep an eye on it when he gets out of here he should follow-up with his regular doctor nephew stays worse or more than 2 weeks and have him follow-up with ear nose and throat.   Arnaldo NatalPaul F Bonnye Halle, MD 06/26/16 [phone]

## 2016-06-26 NOTE — [facility] Group Notes (Signed)
[facility] LCSW Group Therapy  06/26/2016 2:28 PM  Type of Therapy:  Group Therapy  Participation Level:  Patient did not attend group. CSW invited patient to group.   Summary of Progress/Problems:Balance in life: Patients will discuss the concept of balance and how it looks and feels to be unbalanced. Pt will identify areas in their life that is unbalanced and ways to become more balanced.   Alex [name] MSW, LCSWA 06/26/2016, 2:28 PM

## 2016-06-27 DIAGNOSIS — F1994 Other psychoactive substance use, unspecified with psychoactive substance-induced mood disorder: Secondary | ICD-10-CM

## 2016-06-27 LAB — TSH: TSH: 2.59 u[IU]/mL (ref 0.350–4.500)

## 2016-06-27 MED ORDER — INFLUENZA VAC SPLIT QUAD 0.5 ML IM SUSY
0.5000 mL | PREFILLED_SYRINGE | INTRAMUSCULAR | Status: AC
  Administered 2016-06-28: 0.5 mL via INTRAMUSCULAR
  Filled 2016-06-27: qty 0.5

## 2016-06-27 MED ORDER — IBUPROFEN 600 MG PO TABS
600.0000 mg | ORAL_TABLET | Freq: Four times a day (QID) | ORAL | Status: DC | PRN
  Administered 2016-06-27 – 2016-06-28 (×3): 600 mg via ORAL
  Filled 2016-06-27 (×3): qty 1

## 2016-06-27 MED ORDER — PANTOPRAZOLE SODIUM 40 MG PO TBEC
40.0000 mg | DELAYED_RELEASE_TABLET | Freq: Every day | ORAL | Status: DC
  Administered 2016-06-27 – 2016-06-29 (×3): 40 mg via ORAL
  Filled 2016-06-27 (×3): qty 1
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
## 2016-06-27 NOTE — Progress Notes (Signed)
D: Observed pt pacing in the hallway. Patient alert and oriented x4. Patient endorsed oassive SI, but verbally contracts for safety. Pt denied HI/AVH. Pt affect is anxious. Pt very preoccupied with "knot" in his throat, approaching writer several times asking about when it will be looked at and if he can get an "x-ray." Writer palpated a small nodule under the skin on the left side of pt's neck. Nodule was moveable and pt claimed it was painful when pressed upon. Pt claimed "I changed my mind" in regards to jumping off the bridge. Pt eluded to an event or something that triggered him to want to jump off bridge but pt stated "I don't want to talk about it now." Pt rated depression 8/10 and anxiety 6/10. Pt complained of difficulty sleeping. Pt can be very needy. A: Offered active listening and support. Provided therapeutic communication. Assessed pt's neck. Assured pt that Dr. Toni Amendlapacs would look at his neck tomorrow. Reinforced importance of verbal safety contract.  R: Pt cooperative. Pt medication compliant. Will continue Q15 min. checks. Safety maintained.

## 2016-06-27 NOTE — [facility] Group Notes (Signed)
[facility] Group Notes:  (Nursing/MHT/Case Management/Adjunct)  Date:  06/27/2016  Time:  9:27 PM  Type of Therapy:  Evening Wrap-up Group  Participation Level:  Active  Participation Quality:  Appropriate and Attentive  Affect:  Appropriate  Cognitive:  Alert and Appropriate  Insight:  Appropriate, Good and Improving  Engagement in Group:  Developing/Improving and Engaged  Modes of Intervention:  Discussion  Summary of Progress/Problems:  Tomasita MorrowChelsea Nanta Britain Saber 06/27/2016, 9:27 PM

## 2016-06-27 NOTE — H&P (Signed)
Psychiatric Admission Assessment Adult  Patient Identification: Alex [name] MRN:  [id number] Date of Evaluation:  06/27/2016 Chief Complaint:  Major Depression Disorder Principal Diagnosis: Substance induced mood disorder (HCC) Diagnosis:   Patient Active Problem List   Diagnosis Date Noted  . Involuntary commitment [Z04.6] 06/26/2016  . Suicidal ideation [R45.851] 06/26/2016  . Noncompliance [Z91.19] 06/26/2016  . Substance induced mood disorder (HCC) [F19.94] 06/07/2016  . Alcohol abuse [F10.10] 05/27/2016  . Cocaine abuse [F14.10] 05/27/2016   History of Present Illness: Patient presented to the emergency room intoxicated and with recent talk about wanting to die and kill himself by jumping off a bridge. Chronic anxiety and depressive complaints particularly while intoxicated. Ongoing abuse of alcohol and probably other drugs. This morning he appeared to be back to his best baseline. He is denying any suicidal thoughts. Affect was smiling and relaxed. Physically not showing major signs of withdrawal. Has not really been compliant with recommended outpatient treatment. Associated Signs/Symptoms: Depression Symptoms:  difficulty concentrating, recurrent thoughts of death, (Hypo) Manic Symptoms:  Distractibility, Anxiety Symptoms:  Excessive Worry, Psychotic Symptoms:  None PTSD Symptoms: Negative Total Time spent with patient: 1 hour  Past Psychiatric History: Long-standing history of alcohol abuse and depression. Doesn't ever engage in appropriate outpatient substance abuse treatment. History of multiple suicide threats.  Is the patient at risk to self? Yes.    Has the patient been a risk to self in the past 6 months? Yes.    Has the patient been a risk to self within the distant past? Yes.    Is the patient a risk to others? No.  Has the patient been a risk to others in the past 6 months? No.  Has the patient been a risk to others within the distant past? No.   Prior  Inpatient Therapy:   Prior Outpatient Therapy:    Alcohol Screening: 1. How often do you have a drink containing alcohol?: 4 or more times a week 2. How many drinks containing alcohol do you have on a typical day when you are drinking?: 5 or 6 3. How often do you have six or more drinks on one occasion?: Daily or almost daily Preliminary Score: 6 4. How often during the last year have you found that you were not able to stop drinking once you had started?: Weekly 5. How often during the last year have you failed to do what was normally expected from you becasue of drinking?: Never 6. How often during the last year have you needed a first drink in the morning to get yourself going after a heavy drinking session?: Monthly 7. How often during the last year have you had a feeling of guilt of remorse after drinking?: Never 8. How often during the last year have you been unable to remember what happened the night before because you had been drinking?: Never 9. Have you or someone else been injured as a result of your drinking?: No 10. Has a relative or friend or a doctor or another health worker been concerned about your drinking or suggested you cut down?: No Alcohol Use Disorder Identification Test Final Score (AUDIT): 15 Brief Intervention: Patient declined brief intervention Substance Abuse History in the last 12 months:  Yes.   Consequences of Substance Abuse: Legal Consequences:  Has pending court charges Family Consequences:  Gery Pray poor functioning in general with little social support Previous Psychotropic Medications: Yes  Psychological Evaluations: Yes  Past Medical History:  Past Medical History:  Diagnosis Date  .  Alcohol abuse   . Anxiety    History reviewed. No pertinent surgical history. Family History: History reviewed. No pertinent family history. Family Psychiatric  History: Positive for alcohol Tobacco Screening: Have you used any form of tobacco in the last 30 days?  (Cigarettes, Smokeless Tobacco, Cigars, and/or Pipes): Yes Tobacco use, Select all that apply: 5 or more cigarettes per day Are you interested in Tobacco Cessation Medications?: Yes, will notify MD for an order Counseled patient on smoking cessation including recognizing danger situations, developing coping skills and basic information about quitting provided: Yes Social History:  History  Alcohol Use  . 6.0 oz/week  . 10 Cans of beer per week     History  Drug Use  . Types: Marijuana, Cocaine    Additional Social History: Marital status: Single Are you sexually active?: No What is your sexual orientation?: hetersexual Has your sexual activity been affected by drugs, alcohol, medication, or emotional stress?: n/a Does patient have children?: Yes How many children?: 1 How is patient's relationship with their children?: 1 daughter, patient states their relationship is strained due to his alcohol use.                         Allergies:   Allergies  Allergen Reactions  . Peanut Butter Flavor    Lab Results:  Results for orders placed or performed during the hospital encounter of 06/26/16 (from the past 48 hour(s))  TSH     Status: None   Collection Time: 06/27/16  6:37 AM  Result Value Ref Range   TSH 2.590 0.350 - 4.500 uIU/mL    Blood Alcohol level:  Lab Results  Component Value Date   ETH 255 (H) 06/25/2016   ETH 80 (H) 06/06/2016    Metabolic Disorder Labs:  No results found for: HGBA1C, MPG No results found for: PROLACTIN No results found for: CHOL, TRIG, HDL, CHOLHDL, VLDL, LDLCALC  Current Medications: Current Facility-Administered Medications  Medication Dose Route Frequency Provider Last Rate Last Dose  . acetaminophen (TYLENOL) tablet 650 mg  650 mg Oral Q6H PRN Audery Amel, MD      . alum & mag hydroxide-simeth (MAALOX/MYLANTA) 200-200-20 MG/5ML suspension 30 mL  30 mL Oral Q4H PRN Audery Amel, MD      . citalopram (CELEXA) tablet 20 mg  20  mg Oral Daily Audery Amel, MD   20 mg at 06/27/16 1610  . folic acid (FOLVITE) tablet 1 mg  1 mg Oral Daily Audery Amel, MD   1 mg at 06/27/16 [phone]  . ibuprofen (ADVIL,MOTRIN) tablet 600 mg  600 mg Oral Q6H PRN Audery Amel, MD   600 mg at 06/27/16 1105  . [START ON 06/28/2016] Influenza vac split quadrivalent PF (FLUARIX) injection 0.5 mL  0.5 mL Intramuscular Tomorrow-1000 Jimmy Footman, MD      . LORazepam (ATIVAN) tablet 1 mg  1 mg Oral Q6H PRN Audery Amel, MD       Or  . LORazepam (ATIVAN) injection 1 mg  1 mg Intravenous Q6H PRN Audery Amel, MD      . magnesium hydroxide (MILK OF MAGNESIA) suspension 30 mL  30 mL Oral Daily PRN Audery Amel, MD      . multivitamin with minerals tablet 1 tablet  1 tablet Oral Daily Audery Amel, MD   1 tablet at 06/27/16 [phone]  . nicotine (NICODERM CQ - dosed in mg/24 hours) patch 21 mg  21 mg Transdermal Daily Audery Amel, MD   21 mg at 06/27/16 0838  . pantoprazole (PROTONIX) EC tablet 40 mg  40 mg Oral Daily Audery Amel, MD   40 mg at 06/27/16 1604  . thiamine (VITAMIN B-1) tablet 100 mg  100 mg Oral Daily Audery Amel, MD   100 mg at 06/27/16 0838  . traZODone (DESYREL) tablet 100 mg  100 mg Oral QHS PRN Audery Amel, MD       PTA Medications: Prescriptions Prior to Admission  Medication Sig Dispense Refill Last Dose  . citalopram (CELEXA) 40 MG tablet Take 40 mg by mouth daily.   Past Month at Unknown time  . Multiple Vitamin (MULTIVITAMIN) tablet Take 1 tablet by mouth daily.   Past Month at Unknown time  . omeprazole (PRILOSEC) 20 MG capsule Take 20 mg by mouth 2 (two) times daily before a meal.   Past Month at Unknown time  . traZODone (DESYREL) 150 MG tablet Take by mouth at bedtime.   Past Month at Unknown time    Musculoskeletal: Strength & Muscle Tone: within normal limits Gait & Station: normal Patient leans: N/A  Psychiatric Specialty Exam: Physical Exam  Nursing note and vitals  reviewed. Constitutional: He appears well-developed and well-nourished.  HENT:  Head: Normocephalic and atraumatic.  Eyes: Conjunctivae are normal. Pupils are equal, round, and reactive to light.  Neck: Normal range of motion.  Cardiovascular: Regular rhythm and normal heart sounds.   Respiratory: Effort normal. No respiratory distress.  GI: Soft.  Musculoskeletal: Normal range of motion.  Neurological: He is alert.  Skin: Skin is warm and dry.  Psychiatric: He has a normal mood and affect. His behavior is normal. Judgment and thought content normal.    Review of Systems  Constitutional: Negative.   HENT: Negative.   Eyes: Negative.   Respiratory: Negative.   Cardiovascular: Negative.   Gastrointestinal: Negative.   Musculoskeletal: Negative.   Skin: Negative.   Neurological: Negative.   Psychiatric/Behavioral: Positive for memory loss and substance abuse. Negative for depression, hallucinations and suicidal ideas. The patient is nervous/anxious and has insomnia.     Blood pressure (!) 156/91, pulse (!) 52, temperature 97.7 F (36.5 C), temperature source Oral, resp. rate 18, height 5\' 6"  (1.676 m), weight 87.1 kg (192 lb), SpO2 100 %.Body mass index is 30.99 kg/m.  General Appearance: Fairly Groomed  Eye Contact:  Fair  Speech:  Normal Rate  Volume:  Normal  Mood:  Euthymic  Affect:  Appropriate  Thought Process:  Goal Directed  Orientation:  Full (Time, Place, and Person)  Thought Content:  Logical  Suicidal Thoughts:  No  Homicidal Thoughts:  No  Memory:  Immediate;   Good Recent;   Fair Remote;   Fair  Judgement:  Fair  Insight:  Fair  Psychomotor Activity:  Decreased  Concentration:  Concentration: Good  Recall:  Good  Fund of Knowledge:  Good  Language:  Good  Akathisia:  No  Handed:  Right  AIMS (if indicated):     Assets:  Desire for Improvement Housing  ADL's:  Intact  Cognition:  Impaired,  Mild  Sleep:  Number of Hours: 6    Treatment Plan  Summary: Daily contact with patient to assess and evaluate symptoms and progress in treatment, Medication management and Plan Patient appears to of detoxed without difficulty. Not showing any sign of delirium. Mood is stable. Continue current citalopram and trazodone. Tried to impress upon him the obvious  life-threatening nature of his alcohol abuse and get him to commit to staying sober in the future. No other change to treatment plan. Patient may discuss discharge planning with primary treatment team over the next couple days. I did take a look at the node or bump on his neck that he was so concerned about. He has a palpable lymph node on his left side. It seems to be causing him some soreness. I gave him some Motrin and reassurance about it.  Observation Level/Precautions:  15 minute checks  Laboratory:  Chemistry Profile  Psychotherapy:    Medications:    Consultations:    Discharge Concerns:    Estimated LOS:  Other:     Physician Treatment Plan for Primary Diagnosis: Substance induced mood disorder (HCC) Long Term Goal(s): Improvement in symptoms so as ready for discharge  Short Term Goals: Ability to verbalize feelings will improve, Ability to disclose and discuss suicidal ideas, Ability to demonstrate self-control will improve and Ability to identify and develop effective coping behaviors will improve  Physician Treatment Plan for Secondary Diagnosis: Principal Problem:   Substance induced mood disorder (HCC) Active Problems:   Alcohol abuse   Cocaine abuse   Involuntary commitment   Suicidal ideation   Noncompliance  Long Term Goal(s): Improvement in symptoms so as ready for discharge  Short Term Goals: Ability to identify and develop effective coping behaviors will improve, Ability to maintain clinical measurements within normal limits will improve and Compliance with prescribed medications will improve  I certify that inpatient services furnished can reasonably be expected to  improve the patient's condition.    Mordecai RasmussenJohn Analiyah Lechuga, MD 10/1/20175:18 PM

## 2016-06-27 NOTE — [facility] Group Notes (Signed)
[facility] LCSW Group Therapy  06/27/2016 4:46 PM  Type of Therapy:  Group Therapy  Participation Level:  Minimal  Participation Quality:  Resistant  Affect:  Defensive and Resistant  Cognitive:  Lacking  Insight:  Poor  Engagement in Therapy:  Poor  Modes of Intervention:  Activity, Discussion, Education and Support  Summary of Progress/Problems:Safety Planning: Patients identified fears or worries surrounding discharge. Patients offered support to their peers and openly developed safety plans for their individual needs. Patients developed their own safety plan. Patients discussed their warning signs, coping strategies, support system with family and friends, identified mental health professionals, and how to keep their environments safe (ex. Removing unnecessary medications or removing weapons/guns). Patients then discussed their personalized safety plan with the group.    Alex [name] G. Garnette CzechSampson MSW, LCSWA 06/27/2016, 4:46 PM

## 2016-06-27 NOTE — [facility] Suicide Risk Assessment (Signed)
[facility] INPATIENT:  Family/Significant Other Suicide Prevention Education  Suicide Prevention Education:  Education Completed;Lorraine Faucet (mother [phone]),  has been identified by the patient as the family member/significant other with whom the patient will be residing, and identified as the person(s) who will aid the patient in the event of a mental health crisis (suicidal ideations/suicide attempt).  With written consent from the patient, the family member/significant other has been provided the following suicide prevention education, prior to the and/or following the discharge of the patient. Mother reports that patient may not be able to return home when discharged due to patient potentially having a warrant out for his arrest due to an incident that happened 06/24/16. Mother was unsure of the specifics of the situation. Mother also reports that patient has benefited from bipolar medications in the past.   The suicide prevention education provided includes the following:  Suicide risk factors  Suicide prevention and interventions  National Suicide Hotline telephone number  [facility] assessment telephone number  [facility] 911  [facility] and/or [facility]idential Mobile Crisis Unit telephone number  Request made of family/significant other to:  Remove weapons (e.g., guns, rifles, knives), all items previously/currently identified as safety concern.    Remove drugs/medications (over-the-counter, prescriptions, illicit drugs), all items previously/currently identified as a safety concern.  The family member/significant other verbalizes understanding of the suicide prevention education information provided.  The family member/significant other agrees to remove the items of safety concern listed above.  Edan Juday G. Garnette CzechSampson MSW, LCSWA 06/27/2016, 3:45 PM

## 2016-06-27 NOTE — [facility] Suicide Risk Assessment (Signed)
[facility] Admission Suicide Risk Assessment   Nursing information obtained from:   review of nursing notes. Discussed case with nursing. Demographic factors:   single male with little support and ongoing substance abuse and mood symptoms. Current Mental Status:   currently he is lucid and smiling and denies any suicidal thoughts. Loss Factors:   chronic limited resources Historical Factors:   long-standing substance abuse and failure to follow up Risk Reduction Factors:   not much  Total Time spent with patient: 45 minutes Principal Problem: Substance induced mood disorder Diagnosis:   Patient Active Problem List   Diagnosis Date Noted  . Involuntary commitment [Z04.6] 06/26/2016  . Suicidal ideation [R45.851] 06/26/2016  . Noncompliance [Z91.19] 06/26/2016  . Substance induced mood disorder (HCC) [F19.94] 06/07/2016  . Alcohol abuse [F10.10] 05/27/2016  . Cocaine abuse [F14.10] 05/27/2016   Subjective Data: On evaluation today he denies any suicidal ideation. Admits that he frequently talks about it. Patient is not very strong intellectually. Doesn't seem to make very much of this. Denies any psychotic symptoms. Has a little bit of insight into how his drinking is a problem.  Continued Clinical Symptoms:  Alcohol Use Disorder Identification Test Final Score (AUDIT): 15 The "Alcohol Use Disorders Identification Test", Guidelines for Use in Primary Care, Second Edition.  World Science writer [facility]). Score between 0-7:  no or low risk or alcohol related problems. Score between 8-15:  moderate risk of alcohol related problems. Score between 16-19:  high risk of alcohol related problems. Score 20 or above:  warrants further diagnostic evaluation for alcohol dependence and treatment.   CLINICAL FACTORS:   Depression:   Comorbid alcohol abuse/dependence Alcohol/Substance Abuse/Dependencies   Musculoskeletal: Strength & Muscle Tone: within normal limits Gait & Station: normal Patient  leans: N/A  Psychiatric Specialty Exam: Physical Exam  ROS  Blood pressure (!) 156/91, pulse (!) 52, temperature 97.7 F (36.5 C), temperature source Oral, resp. rate 18, height 5\' 6"  (1.676 m), weight 87.1 kg (192 lb), SpO2 100 %.Body mass index is 30.99 kg/m.  General Appearance: Casual  Eye Contact:  Good  Speech:  Clear and Coherent  Volume:  Normal  Mood:  Anxious  Affect:  Blunt  Thought Process:  Goal Directed  Orientation:  Full (Time, Place, and Person)  Thought Content:  Logical  Suicidal Thoughts:  No  Homicidal Thoughts:  No  Memory:  Immediate;   Good Recent;   Fair Remote;   Fair  Judgement:  Impaired  Insight:  Shallow  Psychomotor Activity:  Normal  Concentration:  Concentration: Fair  Recall:  [employer] of Knowledge:  Fair  Language:  Fair  Akathisia:  No  Handed:  Right  AIMS (if indicated):     Assets:  Housing Physical Health  ADL's:  Intact  Cognition:  Impaired,  Mild  Sleep:  Number of Hours: 6      COGNITIVE FEATURES THAT CONTRIBUTE TO RISK:  Loss of executive function    SUICIDE RISK:   Mild:  Suicidal ideation of limited frequency, intensity, duration, and specificity.  There are no identifiable plans, no associated intent, mild dysphoria and related symptoms, good self-control (both objective and subjective assessment), few other risk factors, and identifiable protective factors, including available and accessible social support.   PLAN OF CARE: Patient probably does have a significant risk of suicidal behavior at some point given his ongoing alcohol abuse and impulsivity. Doesn't appear to be acutely suicidal while he is sober. Really trying to work on getting  him to see this and use it as motivation to stay sober. Patient will of course be reevaluated continually before discharge.  I certify that inpatient services furnished can reasonably be expected to improve the patient's condition.  Mordecai RasmussenJohn Katianne Barre, MD 06/27/2016, 5:13 PM

## 2016-06-27 NOTE — Progress Notes (Signed)
D:  Per self inventory rates depression as a 8/10.  Anxiety and hopelessness as 4/10.  Patient is intrusive and needy.  Presents to nurses station frequently with various requests.  Exhibits childlike behaviors at time.  Loud and inappropriate laughter at times. After meeting with doctor today presented to nurses station and states "I am not ready to go home yet" Appears manic at times.  A:  Support and encouragement offered.  Medications given. R:  Receptive to treatment regiment.  Safety maintained.

## 2016-06-27 NOTE — Plan of Care (Signed)
Problem: Activity: Goal: Interest or engagement in activities will improve Outcome: Progressing Pt attended evening group and was seen interacting on the unit.

## 2016-06-28 ENCOUNTER — Inpatient Hospital Stay: Payer: Medicaid Other

## 2016-06-28 ENCOUNTER — Encounter: Payer: Self-pay | Admitting: Psychiatry

## 2016-06-28 DIAGNOSIS — F32A Depression, unspecified: Secondary | ICD-10-CM

## 2016-06-28 DIAGNOSIS — F329 Major depressive disorder, single episode, unspecified: Secondary | ICD-10-CM

## 2016-06-28 DIAGNOSIS — F10939 Alcohol use, unspecified with withdrawal, unspecified: Secondary | ICD-10-CM

## 2016-06-28 DIAGNOSIS — F17200 Nicotine dependence, unspecified, uncomplicated: Secondary | ICD-10-CM

## 2016-06-28 DIAGNOSIS — F10239 Alcohol dependence with withdrawal, unspecified: Secondary | ICD-10-CM

## 2016-06-28 DIAGNOSIS — F1029 Alcohol dependence with unspecified alcohol-induced disorder: Secondary | ICD-10-CM

## 2016-06-28 DIAGNOSIS — F1420 Cocaine dependence, uncomplicated: Secondary | ICD-10-CM

## 2016-06-28 MED ORDER — IBUPROFEN 600 MG PO TABS
600.0000 mg | ORAL_TABLET | Freq: Three times a day (TID) | ORAL | Status: DC
  Administered 2016-06-28 – 2016-06-29 (×4): 600 mg via ORAL
  Filled 2016-06-28 (×4): qty 1

## 2016-06-28 MED ORDER — TRAZODONE HCL 50 MG PO TABS
150.0000 mg | ORAL_TABLET | Freq: Every day | ORAL | Status: DC
  Administered 2016-06-28: 150 mg via ORAL
  Filled 2016-06-28: qty 1

## 2016-06-28 MED ORDER — IBUPROFEN 600 MG PO TABS: 600.0000 mg | ORAL_TABLET | Freq: Three times a day (TID) | ORAL | Status: DC

## 2016-06-28 MED ORDER — CITALOPRAM HYDROBROMIDE 20 MG PO TABS
40.0000 mg | ORAL_TABLET | Freq: Every day | ORAL | Status: DC
  Administered 2016-06-29: 40 mg via ORAL
  Filled 2016-06-28: qty 2

## 2016-06-28 NOTE — [facility] Group Notes (Signed)
[facility] LCSW Group Therapy   06/28/2016 1pm Type of Therapy: Group Therapy   Participation Level: Active   Participation Quality: Attentive, Sharing and Supportive   Affect: Agitated  Cognitive: Alert but delayed  Insight: Developing/Improving and Engaged   Engagement in Therapy: Developing/Improving and Engaged   Modes of Intervention: Clarification, Confrontation, Discussion, Education, Exploration,  Limit-setting, Orientation, Problem-solving, Rapport Building, Dance movement psychotherapisteality Testing, Socialization and Support   Summary of Progress/Problems: Pt identified obstacles faced currently and processed barriers involved in overcoming these obstacles. Pt identified steps necessary for overcoming these obstacles and explored motivation (internal and external) for facing these difficulties head on. Pt further identified one area of concern in their lives and chose a goal to focus on for today.  Pt shared that the pt's primary goal on a daily basis is "Hanging out with friends".  Pt shared that the pt's primary obstacle to this goal are "trying to let it go is hard".  Pt shared the pt overcomes these obstacles by always walking despite family problem.  Pt shared the pt's primary coping mechanism is to focus on watching television or take steps to "get away". Pt was polite and cooperative with the CSW and other group members and focused and attentive to the topics discussed and the sharing of others.   Dorothe PeaJonathan F. Ertha Nabor, LCSWA, LCAS

## 2016-06-28 NOTE — Progress Notes (Signed)
D: Observed pt walking in the hallway. Patient alert and oriented x4. Patient denies SI/HI/AVH. Pt affect is anxious. Pt remains preoccupied about throat "knot." Pt stated his mood is "better." Pt rated depression 7/10 and anxiety 7/10. Pt stares at times and can laugh and smile inappropriately. Pt stated "I'm not ready to go yet." Pt c/o throat pain. A: Offered active listening and support. Provided therapeutic communication. Administered scheduled medications. Gave tylenol prn for pain and trazodone for sleep. R: Pt cooperative. Pt medication compliant. Will continue Q15 min. checks. Safety maintained.

## 2016-06-28 NOTE — Discharge Summary (Signed)
Physician Discharge Summary Note  Patient:  Alex [name] is an 40 y.o., male MRN:  [id number] DOB:  Jul 16, 1976 Patient phone:  [phone] (home)  Patient address:   [address] [place] [address],  Total Time spent with patient: 30 minutes  Date of Admission:  06/26/2016 Date of Discharge: 06/29/16  Reason for Admission:  SI  Principal Problem: Depressive disorder Discharge Diagnoses: Patient Active Problem List   Diagnosis Date Noted  . Alcohol use disorder, moderate, dependence ([place]) [F10.20] 06/28/2016  . Alcohol withdrawal ([place]) [F10.239] 06/28/2016  . Cocaine use disorder, moderate, dependence ([place]) [F14.20] 06/28/2016  . Unspecified Depressive disorder [F32.9] 06/28/2016  . Tobacco use disorder [F17.200] 06/28/2016  . Noncompliance [Z91.19] 06/26/2016    History of Present Illness: Patient presented to the emergency room intoxicated and with recent talk about wanting to die and kill himself by jumping off a bridge. Chronic anxiety and depressive complaints particularly while intoxicated. Ongoing abuse of alcohol and probably other drugs. This morning he appeared to be back to his best baseline. He is denying any suicidal thoughts. Affect was smiling and relaxed. Physically not showing major signs of withdrawal. Has not really been compliant with recommended outpatient treatment. Associated Signs/Symptoms: Depression Symptoms:  difficulty concentrating, recurrent thoughts of death, (Hypo) Manic Symptoms:  Distractibility, Anxiety Symptoms:  Excessive Worry, Psychotic Symptoms:  None PTSD Symptoms: Negative Total Time spent with patient: 1 hour  Past Psychiatric History: Long-standing history of alcohol abuse and depression. Doesn't ever engage in appropriate outpatient substance abuse treatment. History of multiple suicide threats.  Family History: History reviewed. No pertinent family history.  Family Psychiatric  History: Positive for alcohol  Tobacco  Screening: Have you used any form of tobacco in the last 30 days? (Cigarettes, Smokeless Tobacco, Cigars, and/or Pipes): Yes Tobacco use, Select all that apply: 5 or more cigarettes per day Are you interested in Tobacco Cessation Medications?: Yes, will notify MD for an order Counseled patient on smoking cessation including recognizing danger situations, developing coping skills and basic information about quitting provided: Yes   Past Medical History:  Past Medical History:  Diagnosis Date  . Alcohol abuse   . Anxiety   . Cocaine abuse 05/27/2016   History reviewed. No pertinent surgical history.  Social History:  History  Alcohol Use  . 6.0 oz/week  . 10 Cans of beer per week     History  Drug Use  . Types: Marijuana, Cocaine    Social History   Social History  . Marital status: Single    Spouse name: N/A  . Number of children: N/A  . Years of education: N/A   Social History Main Topics  . Smoking status: Current Every Day Smoker    Packs/day: 1.00  . Smokeless tobacco: Never Used  . Alcohol use 6.0 oz/week    10 Cans of beer per week  . Drug use:     Types: Marijuana, Cocaine  . Sexual activity: Yes    Birth control/ protection: None   Other Topics Concern  . None   Social History Narrative  . None    Hospital Course:    Unspecified depressive disorder: Patient will be continued on Celexa but I will decrease the dose from 40 mg to 20 mg a day as he also has been is started on Strattera.  Rule out ADHD: Patient is states that he has been told in the past that he suffers from bipolar disorder. However based on my assessment in experience  with the patient it appears to me that this could be ADHD. Patient said he was on special aid education all through school. Does not appear that he received treatment for ADHD as a child. He reports that he is always on the go and does recognize that people tell him to slow down. During interaction with me he frequently  interrupted me and answer questions before I finished talking. I will start him on Strattera 40 mg a day this medication is beneficial can be increased further  Insomnia the patient will be continued on trazodone 150 mg by mouth daily at bedtime  Alcohol withdrawal: Blood pressure and heart rate are within the normal limits.CIWA scores have been low.  Alcohol use disorder, cocaine use disorder: Patient will benefit from intensive outpatient substance abuse treatment however the patient declines. Appears support specialist from [facility] came to milk with patient today. He declined.  Tobacco use disorder patient is currently receiving a nicotine patch 21 mg a day  Neck nodule and pain: Ultrasound of the neck Small nodule at the area of concern on the left side of the neck. This probably represents a small lymph node as described. Consider surveillance of this area with clinical exam or ultrasound.  Disposition: Will discharge back to his mother's house  Follow-up: Patient has declined from referral to psychiatry or substance abuse.  During his stay in the hospital the patient was impatience, was frequently seen pacing the floors and at times was irritable.  He did not display any unsafe or disruptive behaviors. He had appropriate interactions with others and participated appropriately in group.  On discharge he denies depressed mood, major issues with his sleep, appetite, energy or concentration. He denied suicidality, homicidality or auditory or visual hallucinations.  He denies having any side effects from medications and denies having any physical complaints.  Treating team does not identify any new concerns. They do not have any concerns about the patient's safety or the safety of others upon discharge.  Suicide education and removal of guns has been discussed with the patient's mother.  Patient has declined from being referred to substance abuse and psychiatric services. He is  familiar with the resources in the area such as [facility] and [employer]. He said he will consider going to [facility].  Physical Findings: AIMS: Facial and Oral Movements Muscles of Facial Expression: None, normal Lips and Perioral Area: None, normal Jaw: None, normal Tongue: None, normal,Extremity Movements Upper (arms, wrists, hands, fingers): None, normal Lower (legs, knees, ankles, toes): None, normal, Trunk Movements Neck, shoulders, hips: None, normal, Overall Severity Severity of abnormal movements (highest score from questions above): None, normal Incapacitation due to abnormal movements: None, normal Patient's awareness of abnormal movements (rate only patient's report): No Awareness, Dental Status Current problems with teeth and/or dentures?: No Does patient usually wear dentures?: No  CIWA:  CIWA-Ar Total: 4 COWS:     Musculoskeletal: Strength & Muscle Tone: within normal limits Gait & Station: normal Patient leans: N/A  Psychiatric Specialty Exam: Physical Exam  Constitutional: He is oriented to person, place, and time. He appears well-developed and well-nourished.  HENT:  Head: Normocephalic and atraumatic.  Eyes: EOM are normal.  Neck: Normal range of motion.  Respiratory: Effort normal.  Musculoskeletal: Normal range of motion.  Neurological: He is alert and oriented to person, place, and time.    Review of Systems  Constitutional: Negative.   HENT: Negative.   Eyes: Negative.   Respiratory: Negative.   Cardiovascular: Negative.   Gastrointestinal:  Negative.   Genitourinary: Negative.   Musculoskeletal: Negative.   Skin: Negative.   Neurological: Negative.   Endo/Heme/Allergies: Negative.   Psychiatric/Behavioral: Positive for substance abuse. Negative for depression, hallucinations, memory loss and suicidal ideas. The patient is not nervous/anxious and does not have insomnia.     Blood pressure 136/86, pulse (!) 55, temperature 97.6 F (36.4 C), temperature source  Oral, resp. rate 18, height 5' 6" (1.676 m), weight 87.1 kg (192 lb), SpO2 100 %.Body mass index is 30.99 kg/m.  General Appearance: Fairly Groomed  Eye Contact:  Good  Speech:  Clear and Coherent  Volume:  Normal  Mood:  Anxious  Affect:  Congruent  Thought Process:  Linear and Descriptions of Associations: Intact  Orientation:  Full (Time, Place, and Person)  Thought Content:  Hallucinations: None  Suicidal Thoughts:  No  Homicidal Thoughts:  No  Memory:  Immediate;   Good Recent;   Good Remote;   Good  Judgement:  Poor  Insight:  Lacking  Psychomotor Activity:  Normal  Concentration:  Concentration: Fair and Attention Span: Fair  Recall:  [place] of Knowledge:  Good  Language:  Good  Akathisia:  No  Handed:    AIMS (if indicated):     Assets:  Communication Skills Housing Social Support  ADL's:  Intact  Cognition:  WNL  Sleep:  Number of Hours: 5.25     Have you used any form of tobacco in the last 30 days? (Cigarettes, Smokeless Tobacco, Cigars, and/or Pipes): Yes  Has this patient used any form of tobacco in the last 30 days? (Cigarettes, Smokeless Tobacco, Cigars, and/or Pipes) Yes, Yes, A prescription for an FDA-approved tobacco cessation medication was offered at discharge and the patient refused  Blood Alcohol level:  Lab Results  Component Value Date   ETH 255 (H) 06/25/2016   ETH 80 (H) 06/06/2016   Results for JAKOBI, THETFORD (MRN [id number]) as of 06/28/2016 14:57  Ref. Range 06/05/2016 23:39 06/06/2016 07:38 06/25/2016 21:58 06/25/2016 23:31 06/27/2016 06:37  Sodium Latest Ref Range: 135 - 145 mmol/L 140 140 140    Potassium Latest Ref Range: 3.5 - 5.1 mmol/L 3.9 4.2 4.0    Chloride Latest Ref Range: 101 - 111 mmol/L 108 108 109    CO2 Latest Ref Range: 22 - 32 mmol/L 21 (L) 24 20 (L)    BUN Latest Ref Range: 6 - 20 mg/dL _0 Creatinine Latest Ref Range: 0.61 - 1.24 mg/dL 0.82 0.76 0.92    Calcium Latest Ref Range: 8.9 - 10.3 mg/dL 8.7 (L) 8.9 9.0     EGFR (Non-African Amer.) Latest Ref Range: >60 mL/min >60 >60 >60    EGFR (African American) Latest Ref Range: >60 mL/min >60 >60 >60    Glucose Latest Ref Range: 65 - 99 mg/dL 92 97 83    Anion gap Latest Ref Range: 5 - _1 Alkaline Phosphatase Latest Ref Range: 38 - 126 U/L  69 75    Albumin Latest Ref Range: 3.5 - 5.0 g/dL  4.3 4.4    AST Latest Ref Range: 15 - 41 U/L  31 40    ALT Latest Ref Range: 17 - 63 U/L  23 25    Total Protein Latest Ref Range: 6.5 - 8.1 g/dL  7.5 8.0    Total Bilirubin Latest Ref Range: 0.3 - 1.2 mg/dL  0.6 0.7    Troponin I Latest Ref Range: <  0.03 ng/mL <0.03      WBC Latest Ref Range: 3.8 - 10.6 K/uL 7.6 5.6 7.6    RBC Latest Ref Range: 4.40 - 5.90 MIL/uL 4.68 4.74 4.90    Hemoglobin Latest Ref Range: 13.0 - 18.0 g/dL 16.2 16.4 16.8    HCT Latest Ref Range: 40.0 - 52.0 % 47.1 48.0 48.9    MCV Latest Ref Range: 80.0 - 100.0 fL 100.8 (H) 101.3 (H) 99.7    MCH Latest Ref Range: 26.0 - 34.0 pg 34.7 (H) 34.6 (H) 34.2 (H)    MCHC Latest Ref Range: 32.0 - 36.0 g/dL 34.4 34.1 34.3    RDW Latest Ref Range: 11.5 - 14.5 % 15.0 (H) 15.2 (H) 14.0    Platelets Latest Ref Range: 150 - 440 K/uL 167 165 175    Acetaminophen (Tylenol), S Latest Ref Range: 10 - 30 ug/mL  <10 (L) <70 (L)    Salicylate Lvl Latest Ref Range: 2.8 - 30.0 mg/dL  <4.0 <4.0    TSH Latest Ref Range: 0.350 - 4.500 uIU/mL     2.590  Alcohol, Ethyl (B) Latest Ref Range: <5 mg/dL  80 (H) 255 (H)    Amphetamines, Ur Screen Latest Ref Range: NONE DETECTED   NONE DETECTED  NONE DETECTED   Barbiturates, Ur Screen Latest Ref Range: NONE DETECTED   NONE DETECTED  NONE DETECTED   Benzodiazepine, Ur Scrn Latest Ref Range: NONE DETECTED   NONE DETECTED  NONE DETECTED   Cocaine Metabolite,Ur [place] Latest Ref Range: NONE DETECTED   POSITIVE (A)  POSITIVE (A)   Methadone Scn, Ur Latest Ref Range: NONE DETECTED   NONE DETECTED  NONE DETECTED   MDMA (Ecstasy)Ur Screen Latest Ref Range: NONE DETECTED   NONE  DETECTED  NONE DETECTED   Cannabinoid 50 Ng, Ur [place] Latest Ref Range: NONE DETECTED   NONE DETECTED  NONE DETECTED   Opiate, Ur Screen Latest Ref Range: NONE DETECTED   NONE DETECTED  NONE DETECTED   Phencyclidine (PCP) Ur S Latest Ref Range: NONE DETECTED   NONE DETECTED  NONE DETECTED   Tricyclic, Ur Screen Latest Ref Range: NONE DETECTED   NONE DETECTED  NONE DETECTED    CLINICAL DATA:  Painful nodule on the left side of the neck. Baseball injury in this area 1 week ago.  EXAM: ULTRASOUND OF HEAD/NECK SOFT TISSUES  TECHNIQUE: Ultrasound examination of the head and neck soft tissues was performed in the area of clinical concern.  COMPARISON:  None.  FINDINGS: Small nodule at the area of concern measuring 1.0 x 0.5 x 0.6 cm. Small amount of blood flow in this nodule. This nodule is predominantly hypoechoic. This probably represents a small lymph node and although a normal fatty hilum is not appreciated. No large cysts at the area of concern.  IMPRESSION: Small nodule at the area of concern on the left side of the neck. This probably represents a small lymph node as described. Consider surveillance of this area with clinical exam or ultrasound.   Metabolic Disorder Labs:  No results found for: HGBA1C, MPG No results found for: PROLACTIN No results found for: CHOL, TRIG, HDL, CHOLHDL, VLDL, LDLCALC  See Psychiatric Specialty Exam and Suicide Risk Assessment completed by Attending Physician prior to discharge.  Discharge destination:  Home  Is patient on multiple antipsychotic therapies at discharge:  No   Has Patient had three or more failed trials of antipsychotic monotherapy by history:  No  Recommended Plan for Multiple Antipsychotic Therapies:  NA     Medication List    STOP taking these medications   multivitamin tablet     TAKE these medications     Indication  atomoxetine 40 MG capsule Commonly known as:  STRATTERA Take 1 capsule (40 mg total) by  mouth daily.  Indication:  Attention Deficit Hyperactivity Disorder   citalopram 20 MG tablet Commonly known as:  CELEXA Take 1 tablet (20 mg total) by mouth daily. Start taking on:  06/30/2016 What changed:  medication strength  how much to take  Indication:  depression   omeprazole 20 MG capsule Commonly known as:  PRILOSEC Take 20 mg by mouth 2 (two) times daily before a meal.  Indication:  GERD   traZODone 150 MG tablet Commonly known as:  DESYREL Take by mouth at bedtime.  Indication:  [place]. Go on 06/29/2016.   Why:  2:30pm, Hospital Follow up with Primary Provider by Pt request.  Pt refuses follow up with psychiatric provider. Contact information: [address].  [place] [address] [phone] FAX           Signed: Hildred Priest, MD 06/29/2016, 12:53 PM

## 2016-06-28 NOTE — Progress Notes (Signed)
[facility] MD Progress Note  06/28/2016 11:44 AM Alex [name]  MRN:  [id number] Subjective: Alex [name] is a 40 y.o. male with a history of substance-induced mood disorder and polysubstance abuse presented to ou ER on 9/29  for SI, HI. The patient was brought by police after having cut his right upper arm with a razor "because I don't want to be here anymore."   Patient reports feeling aggravated for making him. He says that people thought that he was trying to kill himself but he wasn't. He says he is not interested in any referral for substance abuse or psychiatric care once he gets discharged. He says he already has a primary care doctor and he plans to follow up there.  Today he denies having suicidality, homicidality or having auditory or visual hallucinations. He does report having depressed mood and past history of depression. He said he was taking trazodone and Celexa at home which were beneficial for him. He claims that he was compliant with the medications prior to coming in.  The patient's urine toxicology was positive for cocaine and his alcohol level was 255.  As far as physical complaints the patient has been reporting having neck pain. He says that he has a nodule that he wants to have checked out. He has been asking for ibuprofen  Per nursing: D: Observed pt walking in the hallway. Patient alert and oriented x4. Patient denies SI/HI/AVH. Pt affect is anxious. Pt remains preoccupied about throat "knot." Pt stated his mood is "better." Pt rated depression 7/10 and anxiety 7/10. Pt stares at times and can laugh and smile inappropriately. Pt stated "I'm not ready to go yet." Pt c/o throat pain. A: Offered active listening and support. Provided therapeutic communication. Administered scheduled medications. Gave tylenol prn for pain and trazodone for sleep. R: Pt cooperative. Pt medication compliant. Will continue Q15 min. checks. Safety maintained.  Principal Problem: Depressive  disorder Diagnosis:   Patient Active Problem List   Diagnosis Date Noted  . Alcohol use disorder, moderate, dependence ([facility]) [F10.20] 06/28/2016  . Alcohol withdrawal ([facility]) [F10.239] 06/28/2016  . Cocaine use disorder, moderate, dependence ([facility]) [F14.20] 06/28/2016  . Unspecified Depressive disorder [F32.9] 06/28/2016  . Tobacco use disorder [F17.200] 06/28/2016  . Noncompliance [Z91.19] 06/26/2016   Total Time spent with patient: 30 minutes  Past Psychiatric History:   Past Medical History:  Past Medical History:  Diagnosis Date  . Alcohol abuse   . Anxiety   . Cocaine abuse 05/27/2016   History reviewed. No pertinent surgical history. Family History: History reviewed. No pertinent family history. Family Psychiatric  History:  Social History:  History  Alcohol Use  . 6.0 oz/week  . 10 Cans of beer per week     History  Drug Use  . Types: Marijuana, Cocaine    Social History   Social History  . Marital status: Single    Spouse name: N/A  . Number of children: N/A  . Years of education: N/A   Social History Main Topics  . Smoking status: Current Every Day Smoker    Packs/day: 1.00  . Smokeless tobacco: Never Used  . Alcohol use 6.0 oz/week    10 Cans of beer per week  . Drug use:     Types: Marijuana, Cocaine  . Sexual activity: Yes    Birth control/ protection: None   Other Topics Concern  . None   Social History Narrative  . None     Current Medications: Current Facility-Administered  Medications  Medication Dose Route Frequency Provider Last Rate Last Dose  . acetaminophen (TYLENOL) tablet 650 mg  650 mg Oral Q6H PRN Audery AmelJohn T Clapacs, MD   650 mg at 06/27/16 2146  . alum & mag hydroxide-simeth (MAALOX/MYLANTA) 200-200-20 MG/5ML suspension 30 mL  30 mL Oral Q4H PRN Audery AmelJohn T Clapacs, MD      . Melene Muller[START ON 06/29/2016] citalopram (CELEXA) tablet 40 mg  40 mg Oral Daily Jimmy FootmanAndrea Hernandez-Gonzalez, MD      . ibuprofen (ADVIL,MOTRIN) tablet 600 mg  600 mg Oral TID  Jimmy FootmanAndrea Hernandez-Gonzalez, MD      . Influenza vac split quadrivalent PF (FLUARIX) injection 0.5 mL  0.5 mL Intramuscular Tomorrow-1000 Jimmy FootmanAndrea Hernandez-Gonzalez, MD      . magnesium hydroxide (MILK OF MAGNESIA) suspension 30 mL  30 mL Oral Daily PRN Audery AmelJohn T Clapacs, MD      . multivitamin with minerals tablet 1 tablet  1 tablet Oral Daily Audery AmelJohn T Clapacs, MD   1 tablet at 06/28/16 0758  . nicotine (NICODERM CQ - dosed in mg/24 hours) patch 21 mg  21 mg Transdermal Daily Audery AmelJohn T Clapacs, MD   21 mg at 06/28/16 0800  . pantoprazole (PROTONIX) EC tablet 40 mg  40 mg Oral Daily Audery AmelJohn T Clapacs, MD   40 mg at 06/28/16 0649  . traZODone (DESYREL) tablet 150 mg  150 mg Oral QHS Jimmy FootmanAndrea Hernandez-Gonzalez, MD        Lab Results:  Results for orders placed or performed during the hospital encounter of 06/26/16 (from the past 48 hour(s))  TSH     Status: None   Collection Time: 06/27/16  6:37 AM  Result Value Ref Range   TSH 2.590 0.350 - 4.500 uIU/mL    Blood Alcohol level:  Lab Results  Component Value Date   ETH 255 (H) 06/25/2016   ETH 80 (H) 06/06/2016    Metabolic Disorder Labs: No results found for: HGBA1C, MPG No results found for: PROLACTIN No results found for: CHOL, TRIG, HDL, CHOLHDL, VLDL, LDLCALC  Physical Findings: AIMS: Facial and Oral Movements Muscles of Facial Expression: None, normal Lips and Perioral Area: None, normal Jaw: None, normal Tongue: None, normal,Extremity Movements Upper (arms, wrists, hands, fingers): None, normal Lower (legs, knees, ankles, toes): None, normal, Trunk Movements Neck, shoulders, hips: None, normal, Overall Severity Severity of abnormal movements (highest score from questions above): None, normal Incapacitation due to abnormal movements: None, normal Patient's awareness of abnormal movements (rate only patient's report): No Awareness, Dental Status Current problems with teeth and/or dentures?: No Does patient usually wear dentures?: No   CIWA:  CIWA-Ar Total: 4 COWS:     Musculoskeletal: Strength & Muscle Tone: within normal limits Gait & Station: normal Patient leans: N/A  Psychiatric Specialty Exam: Physical Exam  Constitutional: He is oriented to person, place, and time. He appears well-developed and well-nourished.  HENT:  Head: Normocephalic and atraumatic.  Eyes: Conjunctivae and EOM are normal.  Neck: Normal range of motion.  Respiratory: Effort normal.  Musculoskeletal: Normal range of motion.  Neurological: He is alert and oriented to person, place, and time.    Review of Systems  Constitutional: Negative.   HENT: Negative for congestion, ear discharge, ear pain, hearing loss, nosebleeds, sore throat and tinnitus.        C/o nodule in his neck  Eyes: Negative.   Respiratory: Negative.  Negative for stridor.   Cardiovascular: Negative.   Gastrointestinal: Negative.   Genitourinary: Negative.   Musculoskeletal: Negative.  Skin: Negative.   Neurological: Negative.  Negative for headaches.  Endo/Heme/Allergies: Negative.   Psychiatric/Behavioral: Positive for depression and substance abuse. Negative for hallucinations, memory loss and suicidal ideas. The patient is not nervous/anxious and does not have insomnia.     Blood pressure 140/82, pulse (!) 52, temperature 97.9 F (36.6 C), temperature source Oral, resp. rate 18, height 5\' 6"  (1.676 m), weight 87.1 kg (192 lb), SpO2 100 %.Body mass index is 30.99 kg/m.  General Appearance: Fairly Groomed  Eye Contact:  Good  Speech:  Clear and Coherent  Volume:  Increased  Mood:  Anxious  Affect:  Congruent  Thought Process:  Linear and Descriptions of Associations: Intact  Orientation:  Full (Time, Place, and Person)  Thought Content:  Hallucinations: None  Suicidal Thoughts:  No  Homicidal Thoughts:  No  Memory:  Immediate;   Fair Recent;   Fair Remote;   Fair  Judgement:  Poor  Insight:  Lacking  Psychomotor Activity:  Normal  Concentration:   Concentration: Fair and Attention Span: Fair  Recall:  [employer] of Knowledge:  Fair  Language:  Good  Akathisia:  No  Handed:    AIMS (if indicated):     Assets:  Communication Skills Housing Social Support  ADL's:  Intact  Cognition:  WNL  Sleep:  Number of Hours: 5.5     Treatment Plan Summary: Daily contact with patient to assess and evaluate symptoms and progress in treatment and Medication management   Unspecified depressive disorder: Patient will be continued on Celexa 40 mg by mouth daily  Insomnia the patient will be continued on trazodone 150 mg by mouth daily at bedtime  Alcohol withdrawal: Blood pressure and heart rate are within the normal limits.CIWA scores have been low.  Alcohol use disorder, cocaine use disorder: Patient will benefit from intensive outpatient substance abuse treatment however the patient declines. Appears support specialist from [facility] came to milk with patient today. He declined.  Tobacco use disorder patient is currently receiving a nicotine patch 21 mg a day  Neck nodule and pain: Ultrasound of the neck has been ordered  Diet regular  Precautions  Routine  Hospitalization status IVC  Disposition: Will discharge back to his mother's house  Follow-up: Patient has declined from referral to psychiatry or substance abuse.  Potential discharge in the next 24-48 hours  Jimmy Footman, MD 06/28/2016, 11:44 AM

## 2016-06-28 NOTE — [facility] Suicide Risk Assessment (Signed)
[facility]   Principal Problem: Depressive disorder Discharge Diagnoses:  Patient Active Problem List   Diagnosis Date Noted  . Alcohol use disorder, moderate, dependence (HCC) [F10.20] 06/28/2016  . Alcohol withdrawal (HCC) [F10.239] 06/28/2016  . Cocaine use disorder, moderate, dependence (HCC) [F14.20] 06/28/2016  . Unspecified Depressive disorder [F32.9] 06/28/2016  . Tobacco use disorder [F17.200] 06/28/2016  . Noncompliance [Z91.19] 06/26/2016    Total Time spent with patient: 30 minutes    Psychiatric Specialty Exam: ROS  Blood pressure 140/82, pulse (!) 52, temperature 97.9 F (36.6 C), temperature source Oral, resp. rate 18, height 5\' 6"  (1.676 m), weight 87.1 kg (192 lb), SpO2 100 %.Body mass index is 30.99 kg/m.                                                       Mental Status Per Nursing Assessment::   On Admission:     Demographic Factors:  Male, Caucasian and Low socioeconomic status  Loss Factors: Financial problems/change in socioeconomic status  Historical Factors: Impulsivity  Risk Reduction Factors:   Positive social support  Continued Clinical Symptoms:  Alcohol/Substance Abuse/Dependencies  Cognitive Features That Contribute To Risk:  Closed-mindedness    Suicide Risk:  Minimal: No identifiable suicidal ideation.  Patients presenting with no risk factors but with morbid ruminations; may be classified as minimal risk based on the severity of the depressive symptoms  Follow-up Information    [facility].   Why:  2:30pm, Hospital Follow up with Primary Provider by Pt request.  Pt refuses follow up with psychiatric provider. Contact information: [address]  [place] [id number] [phone] Alveria ApleyFAX           Hernandez-Gonzalez,  Tanikka Bresnan, MD 06/28/2016, 3:07 PM

## 2016-06-28 NOTE — Progress Notes (Signed)
Recreation Therapy Notes  Date: 10.02.17 Time: 9:30 am Location: Craft Room  Group Topic: Self-expression  Goal Area(s) Addresses:  Patient will draw a bottle of how they see themselves. Patient will write at least one emotion they are experiencing.  Behavioral Response: Did not attend  Intervention: Bottled Up  Activity: Patients were instructed to draw a bottle of how they see themselves. Patients were instructed to write the emotions they were feeling on the inside of the bottle.  Education: LRT educated group on other forms of self-expression.  Education Outcome: Patient did not attend group.   Clinical Observations/Feedback: Patient did not attend group.  Jacquelynn CreeGreene,Ledford Goodson M, LRT/CTRS 06/28/2016 10:16 AM

## 2016-06-28 NOTE — Plan of Care (Signed)
Problem: Coping: Goal: Ability to demonstrate self-control will improve Outcome: Progressing Pt demonstrating appropriate self-control.     

## 2016-06-29 MED ORDER — CITALOPRAM HYDROBROMIDE 20 MG PO TABS: 20.0000 mg | ORAL_TABLET | Freq: Every day | ORAL | 0 refills | Status: DC

## 2016-06-29 MED ORDER — ATOMOXETINE HCL 40 MG PO CAPS: 40.0000 mg | ORAL_CAPSULE | Freq: Every day | ORAL | 0 refills | Status: DC

## 2016-06-29 NOTE — Progress Notes (Signed)
D: Patient has been anxious and preoccupied with discharged. Making statements such as he's going to do what he has to do to get out of here like climb the fence. He is redirectable but persistent. Denies SI/HI/AVH at this time. Rating pain at a 6.  A: Medication given with education. Encouragement provided. Redirection provided when needed. PRN Tylenol given for pain.  R: Patient compliant with medication. Safety maintained with 15 min checks.

## 2016-06-29 NOTE — [facility] Group Notes (Signed)
Goals Group Date/Time: 06/29/2016 9:00 AM Type of Therapy and Topic: Group Therapy: Goals Group: SMART Goals   Participation Level: Moderate  Description of Group:    The purpose of a daily goals group is to assist and guide patients in setting recovery/wellness-related goals. The objective is to set goals as they relate to the crisis in which they were admitted. Patients will be using SMART goal modalities to set measurable goals. Characteristics of realistic goals will be discussed and patients will be assisted in setting and processing how one will reach their goal. Facilitator will also assist patients in applying interventions and coping skills learned in psycho-education groups to the SMART goal and process how one will achieve defined goal.   Therapeutic Goals:   -Patients will develop and document one goal related to or their crisis in which brought them into treatment.  -Patients will be guided by LCSW using SMART goal setting modality in how to set a measurable, attainable, realistic and time sensitive goal.  -Patients will process barriers in reaching goal.  -Patients will process interventions in how to overcome and successful in reaching goal.   Patient's Goal:  Pt began group 15 minutes into group but did not sit and did not participate.  Pt stood in the doorway and observed for the remainder of the group.   Therapeutic Modalities:  Motivational Interviewing  Research officer, political partyCognitive Behavioral Therapy  Crisis Intervention Model  SMART goals setting   Dorothe PeaJonathan F. Lamika Connolly, LCSWA, LCAS

## 2016-06-29 NOTE — [facility] Group Notes (Signed)
[facility] Group Notes:  (Nursing/MHT/Case Management/Adjunct)  Date:  06/29/2016  Time:  4:20 AM  Type of Therapy:  Group Therapy  Participation Level:  Active  Participation Quality:  Attentive  Affect:  Defensive  Cognitive:  Alert  Insight:  Lacking  Engagement in Group:  Engaged  Modes of Intervention:  Discussion  Summary of Progress/Problems:  Alex [name] 06/29/2016, 4:20 AM

## 2016-06-29 NOTE — Progress Notes (Signed)
Recreation Therapy Notes  Date: 10.03.17 Time: 9:30 am Location: Craft Room  Group Topic: Goal Setting  Goal Area(s) Addresses:  Patient will write at least one goal. Patient will write at least one obstacle.  Behavioral Response: Did not attend  Intervention: Recovery Goal Chart  Activity: Patients were instructed to make a Recovery Goal Chart including their goals, obstacles, the date they started working on their goals, and the date they achieved their goals.  Education: LRT educated patients on healthy ways to celebrate reaching their goals.  Education Outcome: Patient did not attend group.  Clinical Observations/Feedback: Patient did not attend group.  Jacquelynn CreeGreene,Srihan Brutus M, LRT/CTRS 06/29/2016 11:47 AM

## 2016-06-29 NOTE — Progress Notes (Signed)
  [facility] :  Will you be returning to the same living situation after discharge:  Yes,    At discharge, do you have transportation home?: Yes,    Do you have the ability to pay for your medications: Yes,     Release of information consent forms completed and in the chart;  Patient's signature needed at discharge.  Patient to Follow up at: Follow-up Information    [facility].   Why:  2:30pm, Hospital Follow up with Primary Provider by Pt request.  Pt refuses follow up with psychiatric provider. Contact information: [address]  [place] [id number] [phone] FAX          Next level of care provider has access [facility] Link:no  Safety Planning and Suicide Prevention discussed: Yes,     Have you used any form of tobacco in the last 30 days? (Cigarettes, Smokeless Tobacco, Cigars, and/or Pipes): Yes  Has patient been referred to the Quitline?: Patient refused referral  Patient has been referred for addiction treatment: Yes  Glennon MacSara P Paisely Brick, MSW, LCSW 06/29/2016, 4:24 PM

## 2016-06-29 NOTE — Progress Notes (Signed)
Patient is hypeverbal and tangential.  Intrusive.  Denies SI/Hi/AVH.   Discharge instructions given, verbalized understanding.  Prescriptions given and personal belongings returned.  Escorted off unit by this Clinical research associatewriter to main entrance to meet mother to travel home.

## 2016-07-02 ENCOUNTER — Emergency Department: Payer: Medicaid Other

## 2016-07-02 ENCOUNTER — Encounter: Payer: Self-pay | Admitting: Emergency Medicine

## 2016-07-02 ENCOUNTER — Emergency Department
Admission: EM | Admit: 2016-07-02 | Discharge: 2016-07-02 | Disposition: A | Discharge status: Home / Self Care | Payer: Medicaid Other | Attending: Emergency Medicine | Admitting: Emergency Medicine

## 2016-07-02 DIAGNOSIS — F1910 Other psychoactive substance abuse, uncomplicated: Secondary | ICD-10-CM | POA: Diagnosis not present

## 2016-07-02 DIAGNOSIS — F17200 Nicotine dependence, unspecified, uncomplicated: Secondary | ICD-10-CM | POA: Insufficient documentation

## 2016-07-02 DIAGNOSIS — Z5181 Encounter for therapeutic drug level monitoring: Secondary | ICD-10-CM | POA: Insufficient documentation

## 2016-07-02 DIAGNOSIS — F1994 Other psychoactive substance use, unspecified with psychoactive substance-induced mood disorder: Secondary | ICD-10-CM

## 2016-07-02 DIAGNOSIS — F1029 Alcohol dependence with unspecified alcohol-induced disorder: Secondary | ICD-10-CM | POA: Diagnosis present

## 2016-07-02 DIAGNOSIS — R45851 Suicidal ideations: Secondary | ICD-10-CM

## 2016-07-02 DIAGNOSIS — F1420 Cocaine dependence, uncomplicated: Secondary | ICD-10-CM | POA: Diagnosis present

## 2016-07-02 LAB — ETHANOL: ALCOHOL ETHYL (B): 187 mg/dL — AB (ref <5)

## 2016-07-02 LAB — COMPREHENSIVE METABOLIC PANEL
ALK PHOS: 75 U/L (ref 38–126)
ALT: 19 U/L (ref 17–63)
AST: 24 U/L (ref 15–41)
Albumin: 4.5 g/dL (ref 3.5–5.0)
Anion gap: 10 (ref 5–15)
BILIRUBIN TOTAL: 0.5 mg/dL (ref 0.3–1.2)
BUN: 11 mg/dL (ref 6–20)
CALCIUM: 8.9 mg/dL (ref 8.9–10.3)
CHLORIDE: 106 mmol/L (ref 101–111)
CO2: 23 mmol/L (ref 22–32)
CREATININE: 0.84 mg/dL (ref 0.61–1.24)
Glucose, Bld: 87 mg/dL (ref 65–99)
Potassium: 4.2 mmol/L (ref 3.5–5.1)
Sodium: 139 mmol/L (ref 135–145)
TOTAL PROTEIN: 8 g/dL (ref 6.5–8.1)

## 2016-07-02 LAB — URINE DRUG SCREEN, QUALITATIVE (ARMC ONLY)
AMPHETAMINES, UR SCREEN: NOT DETECTED
Barbiturates, Ur Screen: NOT DETECTED
Benzodiazepine, Ur Scrn: NOT DETECTED
Cannabinoid 50 Ng, Ur ~~LOC~~: NOT DETECTED
Cocaine Metabolite,Ur ~~LOC~~: POSITIVE — AB
MDMA (ECSTASY) UR SCREEN: NOT DETECTED
METHADONE SCREEN, URINE: NOT DETECTED
Opiate, Ur Screen: NOT DETECTED
PHENCYCLIDINE (PCP) UR S: NOT DETECTED
Tricyclic, Ur Screen: NOT DETECTED

## 2016-07-02 LAB — CBC WITH DIFFERENTIAL/PLATELET
BASOS ABS: 0 10*3/uL (ref 0–0.1)
Basophils Relative: 1 %
EOS ABS: 0.2 10*3/uL (ref 0–0.7)
EOS PCT: 2 %
HCT: 48.9 % (ref 40.0–52.0)
Hemoglobin: 17.1 g/dL (ref 13.0–18.0)
LYMPHS PCT: 35 %
Lymphs Abs: 2.4 10*3/uL (ref 1.0–3.6)
MCH: 34.6 pg — ABNORMAL HIGH (ref 26.0–34.0)
MCHC: 34.9 g/dL (ref 32.0–36.0)
MCV: 99 fL (ref 80.0–100.0)
MONO ABS: 0.5 10*3/uL (ref 0.2–1.0)
Monocytes Relative: 8 %
Neutro Abs: 3.8 10*3/uL (ref 1.4–6.5)
Neutrophils Relative %: 54 %
PLATELETS: 165 10*3/uL (ref 150–440)
RBC: 4.93 MIL/uL (ref 4.40–5.90)
RDW: 13.9 % (ref 11.5–14.5)
WBC: 6.9 10*3/uL (ref 3.8–10.6)

## 2016-07-02 MED ORDER — LORAZEPAM 2 MG PO TABS: 0.0000 mg | ORAL_TABLET | Freq: Four times a day (QID) | ORAL | Status: DC

## 2016-07-02 MED ORDER — LORAZEPAM 2 MG PO TABS: 0.0000 mg | ORAL_TABLET | Freq: Two times a day (BID) | ORAL | Status: DC

## 2016-07-02 NOTE — ED Notes (Signed)
Patient observed with no unusual behavior or acute distress. Patient with no verbalizes needs or c/o at this time. Will continue to monitor and follow up at needed. Pt monitored by security cameras at all times.   

## 2016-07-02 NOTE — ED Notes (Signed)
Pt given breakfast tray

## 2016-07-02 NOTE — ED Provider Notes (Signed)
[facility] Emergency Department Provider Note  Time seen: 1:34 AM  I have reviewed the triage vital signs and the nursing notes.   HISTORY  Chief Complaint Psychiatric Evaluation    HPI Alex [name] is a 40 y.o. male with a past medical history of alcohol and cocaine abuse who presents to the emergency department for suicidal ideation. According to the patient he states he was going to jump off a bridge tonight. Patient admits alcohol and cocaine use tonight. States he is feeling very suicidal because he has a lot going on but does not go into specifics. States if we discharge him home he will kill himself. Patient denies any medical complaints tonight.  Past Medical History:  Diagnosis Date  . Alcohol abuse   . Anxiety   . Cocaine abuse 05/27/2016    Patient Active Problem List   Diagnosis Date Noted  . Alcohol use disorder, moderate, dependence (HCC) 06/28/2016  . Alcohol withdrawal (HCC) 06/28/2016  . Cocaine use disorder, moderate, dependence (HCC) 06/28/2016  . Unspecified Depressive disorder 06/28/2016  . Tobacco use disorder 06/28/2016  . Noncompliance 06/26/2016    History reviewed. No pertinent surgical history.  Prior to Admission medications   Medication Sig Start Date End Date Taking? Authorizing Provider  atomoxetine (STRATTERA) 40 MG capsule Take 1 capsule (40 mg total) by mouth daily. 06/29/16   Jimmy Footman, MD  citalopram (CELEXA) 20 MG tablet Take 1 tablet (20 mg total) by mouth daily. 06/30/16   Jimmy Footman, MD  omeprazole (PRILOSEC) 20 MG capsule Take 20 mg by mouth 2 (two) times daily before a meal.    Historical Provider, MD  traZODone (DESYREL) 150 MG tablet Take by mouth at bedtime.    Historical Provider, MD    Allergies  Allergen Reactions  . Peanut Butter Flavor     History reviewed. No pertinent family history.  Social History Social History  Substance Use Topics  . Smoking status:  Current Every Day Smoker    Packs/day: 1.00  . Smokeless tobacco: Never Used  . Alcohol use 6.0 oz/week    10 Cans of beer per week    Review of Systems Constitutional: Negative for fever Cardiovascular: Negative for chest pain. Respiratory: Negative for shortness of breath. Gastrointestinal: Negative for abdominal pain Neurological: Negative for headache 10-point ROS otherwise negative.  ____________________________________________   PHYSICAL EXAM:  VITAL SIGNS: ED Triage Vitals  Enc Vitals Group     BP 07/02/16 0045 121/75     Pulse Rate 07/02/16 0045 87     Resp 07/02/16 0045 18     Temp 07/02/16 0045 98.4 F (36.9 C)     Temp Source 07/02/16 0045 Oral     SpO2 07/02/16 0045 95 %     Weight 07/02/16 0046 203 lb (92.1 kg)     Height 07/02/16 0046 5\' 6"  (1.676 m)     Head Circumference --      Peak Flow --      Pain Score 07/02/16 0046 0     Pain Loc --      Pain Edu? --      Excl. in GC? --     Constitutional: Alert and oriented. Well appearing and in no distress. Eyes: Normal exam ENT   Head: Normocephalic and atraumatic.   Mouth/Throat: Mucous membranes are moist. Cardiovascular: Normal rate, regular rhythm. No murmur Respiratory: Normal respiratory effort without tachypnea nor retractions. Breath sounds are clear  Gastrointestinal: Soft and nontender. No distention.  Musculoskeletal: Nontender with normal range of motion in all extremities. Neurologic:  Normal speech and language. No gross focal neurologic deficits  Skin:  Skin is warm, dry and intact.  Psychiatric: States active suicidal ideation.  ____________________________________________    INITIAL IMPRESSION / ASSESSMENT AND PLAN / ED COURSE  Pertinent labs & imaging results that were available during my care of the patient were reviewed by me and considered in my medical decision making (see chart for details).  Patient presents to the emergency department with suicidal ideation, admits  alcohol and cocaine use tonight. Patient states he will kill himself if we discharge him. He is now requesting food. Patient has no medical complaints. We will check labs. I will place the patient under an involuntary commitment given his active suicidal ideation with likely alcohol intoxication.  Labs show an alcohol level of 187, cocaine positive toxicology, otherwise normal labs. Chest x-ray negative. Patient medically cleared at this time, currently awaiting psychiatric evaluation.  ____________________________________________   FINAL CLINICAL IMPRESSION(S) / ED DIAGNOSES  Suicidal ideation    Minna AntisKevin Wylie Coon, MD 07/02/16 [phone]

## 2016-07-02 NOTE — ED Notes (Signed)
Pt moved to room 1 due to TV not working.

## 2016-07-02 NOTE — ED Triage Notes (Signed)
Pt arrived to the ED in custody of [place] [employer] under voluntary commitment for suicidal Ideation with the plan of jumping off of a bridge. Pt is AOx4 in no apparent distress under the influence of alcohol.

## 2016-07-02 NOTE — ED Notes (Signed)
MD at bedside. 

## 2016-07-02 NOTE — ED Notes (Signed)
Pt sitting in dayroom, requesting to be discharged.

## 2016-07-02 NOTE — ED Provider Notes (Signed)
Patient has been evaluated by psychiatry at bedside. Patient has metabolized his alcohol he does not have any evidence of any unusual behavior are or active suicidal ideation. Patient felt to be clinically stable for outpatient follow-up.   Willy EddyPatrick Travone Georg, MD 07/02/16 1229

## 2016-07-02 NOTE — ED Notes (Signed)
Pt is alert and oriented on admission. Pt endorses passive SI but does contract for safety. Pt hoping to be admitted to in patient unit. Pt still slightly intoxicated but is cooperative with staff. Writer provided fluids and 15 minute checks are ongoing for safety.

## 2016-07-02 NOTE — ED Notes (Signed)
Radiology came to get patient to take to xray. ODS Officer told radiology staff that patient was voluntary. Pt then stated, "I brought myself here." This RN stated, "I know, you're here voluntarily. You're not under IVC, or involuntary commitment." Pt stated, "I know, but I want to be." RN explained that that was not for myself to determine, but for the doctor to decide.

## 2016-07-02 NOTE — Progress Notes (Signed)
LCSW consulted with Dr Weber Cooks and was informed   patient can return home.   LCSW met with patient and he reports he is not suicidal or homicidal and was agreeable to follow up with [facility], LCSW provided [place] Engineer, technical sales and std list for DSS and [employer] as requested.  Patient reports he will call someone to pick him up.  [employer] LCSW [phone]

## 2016-07-02 NOTE — ED Notes (Signed)
Pt refused shower at this time.

## 2016-07-02 NOTE — Consult Note (Signed)
[facility] Consult   Reason for Consult:  Consult for 40 year old man with a history of substance abuse and mood symptoms who comes into the emergency room with reported suicidal thoughts and alcohol and drug abuse Referring Physician:  Quentin Cornwall Patient Identification: Alex [name] MRN:  [id number] Principal Diagnosis: Substance induced mood disorder [facility]) Diagnosis:   Patient Active Problem List   Diagnosis Date Noted  . Substance induced mood disorder ([place]) [F19.94] 07/02/2016  . Alcohol use disorder, moderate, dependence ([place]) [F10.20] 06/28/2016  . Alcohol withdrawal ([place]) [F10.239] 06/28/2016  . Cocaine use disorder, moderate, dependence ([place]) [F14.20] 06/28/2016  . Unspecified Depressive disorder [F32.9] 06/28/2016  . Tobacco use disorder [F17.200] 06/28/2016  . Noncompliance [Z91.19] 06/26/2016    Total Time spent with patient: 1 hour  Subjective:   Alex [name] is a 40 y.o. male patient admitted with "I don't know what to do with myself".  HPI:  Patient interviewed. Chart reviewed. Labs reviewed. 40 year old man who was just discharged from the inpatient psychiatric service earlier this week returns after being picked up by law enforcement. Patient's blood alcohol level was elevated around 200 and had a positive drug screen for cocaine. He reported that he was having thoughts of jumping off a bridge into the hall [name]. [name] that he is still feeling discouraged and down. Doesn't know what he should do with himself. He did not go to follow-up with any outpatient treatment after discharge and has not been compliant with prescribed medication. He minimized his substance abuse in fact denying any drug abuse except for marijuana. It is not currently showing any signs of psychosis.  [place] outside the home. Little activity. Social life is mainly around substance abuse.  Medical history: Patient has  no significant ongoing active medical problems.  Substance abuse history: Well-established problem with ongoing alcohol abuse as well as intermittent abuse of cocaine and marijuana. Terms of mood disorder and suicidal ideation recur when intoxicated and then resolved as soon as he sobers up.  Past Psychiatric History: Patient has had several visits to the emergency room and more than one inpatient hospitalization including inpatient hospitalization this past weekend. At that time he was also reporting suicidal ideation which resolved completely as soon as he was admitted to the hospital. Does not show signs of depression while he is sober and in the hospital. Has been prescribed antidepressant medicine but has not been compliant. Does not engage in outpatient substance abuse treatment appropriately.  Risk to Self: Suicidal Ideation: Yes-Currently Present Suicidal Intent: Yes-Currently Present Is patient at risk for suicide?: No Suicidal Plan?: Yes-Currently Present Specify Current Suicidal Plan: Pt reports plan to jump off bridge Access to Means: Yes Specify Access to Suicidal Means: access to bridge What has been your use of drugs/alcohol within the last 12 months?: alcohol  How many times?: 2 Other Self Harm Risks: None identified Triggers for Past Attempts: Unknown Intentional Self Injurious Behavior: None Risk to Others: Homicidal Ideation: No Thoughts of Harm to Others: No Current Homicidal Intent: No Current Homicidal Plan: No Access to Homicidal Means: No Identified Victim: None identified History of harm to others?: No Assessment of Violence: None Noted Violent Behavior Description: None identified Does patient have access to weapons?: No Criminal Charges Pending?: No Does patient have a court date: No Prior Inpatient Therapy: Prior Inpatient Therapy: Yes Prior Therapy Dates: 05/2016; 06/2016 Prior Therapy Facilty/Provider(s): [facility] Reason for Treatment: Depression;  alcohol  Prior Outpatient Therapy: Prior Outpatient Therapy: No Prior Therapy Dates: N/A Prior Therapy Facilty/Provider(s): N/A Reason for Treatment: N/A Does patient have an ACCT team?: No Does patient have Intensive In-House Services?  : No Does patient have [facility] services? : No Does patient have P4CC services?: No  Past Medical History:  Past Medical History:  Diagnosis Date  . Alcohol abuse   . Anxiety   . Cocaine abuse 05/27/2016   History reviewed. No pertinent surgical history. Family History: History reviewed. No pertinent family history. Family Psychiatric  History: Positive for substance abuse Social History:  History  Alcohol Use  . 6.0 oz/week  . 10 Cans of beer per week     History  Drug Use  . Types: Marijuana, Cocaine    Social History   Social History  . Marital status: Single    Spouse name: N/A  . Number of children: N/A  . Years of education: N/A   Social History Main Topics  . Smoking status: Current Every Day Smoker    Packs/day: 1.00  . Smokeless tobacco: Never Used  . Alcohol use 6.0 oz/week    10 Cans of beer per week  . Drug use:     Types: Marijuana, Cocaine  . Sexual activity: Yes    Birth control/ protection: None   Other Topics Concern  . None   Social History Narrative  . None   Additional Social History:    Allergies:   Allergies  Allergen Reactions  . Peanut Butter Flavor     Labs:  Results for orders placed or performed during the hospital encounter of 07/02/16 (from the past 48 hour(s))  Urine Drug Screen, Qualitative ([place] only)     Status: Abnormal   Collection Time: 07/02/16 12:47 AM  Result Value Ref Range   Tricyclic, Ur Screen NONE DETECTED NONE DETECTED   Amphetamines, Ur Screen NONE DETECTED NONE DETECTED   MDMA (Ecstasy)Ur Screen NONE DETECTED NONE DETECTED   Cocaine Metabolite,Ur [place] POSITIVE (A) NONE DETECTED   Opiate, Ur Screen NONE DETECTED NONE DETECTED   Phencyclidine (PCP) Ur S NONE DETECTED NONE  DETECTED   Cannabinoid 50 Ng, Ur [place] NONE DETECTED NONE DETECTED   Barbiturates, Ur Screen NONE DETECTED NONE DETECTED   Benzodiazepine, Ur Scrn NONE DETECTED NONE DETECTED   Methadone Scn, Ur NONE DETECTED NONE DETECTED    Comment: (NOTE) 073  Tricyclics, urine               Cutoff 1000 ng/mL 200  Amphetamines, urine             Cutoff 1000 ng/mL 300  MDMA (Ecstasy), urine           Cutoff 500 ng/mL 400  Cocaine Metabolite, urine       Cutoff 300 ng/mL 500  Opiate, urine                   Cutoff 300 ng/mL 600  Phencyclidine (PCP), urine      Cutoff 25 ng/mL 700  Cannabinoid, urine              Cutoff 50 ng/mL 800  Barbiturates, urine             Cutoff 200 ng/mL 900  Benzodiazepine, urine           Cutoff 200 ng/mL 1000 Methadone, urine                Cutoff 300 ng/mL 1100 1200 The urine drug screen  provides only a preliminary, unconfirmed 1300 analytical test result and should not be used for non-medical 1400 purposes. Clinical consideration and professional judgment should 1500 be applied to any positive drug screen result due to possible 1600 interfering substances. A more specific alternate chemical method 1700 must be used in order to obtain a confirmed analytical result.  1800 Gas chromato graphy / mass spectrometry (GC/MS) is the preferred 1900 confirmatory method.   Comprehensive metabolic panel     Status: None   Collection Time: 07/02/16 12:50 AM  Result Value Ref Range   Sodium 139 135 - 145 mmol/L   Potassium 4.2 3.5 - 5.1 mmol/L   Chloride 106 101 - 111 mmol/L   CO2 23 22 - 32 mmol/L   Glucose, Bld 87 65 - 99 mg/dL   BUN 11 6 - 20 mg/dL   Creatinine, Ser 0.84 0.61 - 1.24 mg/dL   Calcium 8.9 8.9 - 10.3 mg/dL   Total Protein 8.0 6.5 - 8.1 g/dL   Albumin 4.5 3.5 - 5.0 g/dL   AST 24 15 - 41 U/L   ALT 19 17 - 63 U/L   Alkaline Phosphatase 75 38 - 126 U/L   Total Bilirubin 0.5 0.3 - 1.2 mg/dL   GFR calc non Af Amer >60 >60 mL/min   GFR calc Af Amer >60 >60 mL/min     Comment: (NOTE) The eGFR has been calculated using the CKD EPI equation. This calculation has not been validated in all clinical situations. eGFR's persistently <60 mL/min signify possible Chronic Kidney Disease.    Anion gap 10 5 - 15  Ethanol     Status: Abnormal   Collection Time: 07/02/16 12:50 AM  Result Value Ref Range   Alcohol, Ethyl (B) 187 (H) <5 mg/dL    Comment:        LOWEST DETECTABLE LIMIT FOR SERUM ALCOHOL IS 5 mg/dL FOR MEDICAL PURPOSES ONLY   CBC with Diff     Status: Abnormal   Collection Time: 07/02/16 12:50 AM  Result Value Ref Range   WBC 6.9 3.8 - 10.6 K/uL   RBC 4.93 4.40 - 5.90 MIL/uL   Hemoglobin 17.1 13.0 - 18.0 g/dL   HCT 48.9 40.0 - 52.0 %   MCV 99.0 80.0 - 100.0 fL   MCH 34.6 (H) 26.0 - 34.0 pg   MCHC 34.9 32.0 - 36.0 g/dL   RDW 13.9 11.5 - 14.5 %   Platelets 165 150 - 440 K/uL   Neutrophils Relative % 54 %   Neutro Abs 3.8 1.4 - 6.5 K/uL   Lymphocytes Relative 35 %   Lymphs Abs 2.4 1.0 - 3.6 K/uL   Monocytes Relative 8 %   Monocytes Absolute 0.5 0.2 - 1.0 K/uL   Eosinophils Relative 2 %   Eosinophils Absolute 0.2 0 - 0.7 K/uL   Basophils Relative 1 %   Basophils Absolute 0.0 0 - 0.1 K/uL    Current Facility-Administered Medications  Medication Dose Route Frequency Provider Last Rate Last Dose  . LORazepam (ATIVAN) tablet 0-4 mg  0-4 mg Oral Q6H Harvest Dark, MD       Followed by  . [START ON 07/04/2016] LORazepam (ATIVAN) tablet 0-4 mg  0-4 mg Oral Q12H Harvest Dark, MD       Current Outpatient Prescriptions  Medication Sig Dispense Refill  . atomoxetine (STRATTERA) 40 MG capsule Take 1 capsule (40 mg total) by mouth daily. 30 capsule 0  . citalopram (CELEXA) 20 MG tablet Take 1 tablet (  20 mg total) by mouth daily. 30 tablet 0  . omeprazole (PRILOSEC) 20 MG capsule Take 20 mg by mouth 2 (two) times daily before a meal.    . traZODone (DESYREL) 150 MG tablet Take by mouth at bedtime.      Musculoskeletal: Strength &  Muscle Tone: within normal limits Gait & Station: normal Patient leans: N/A  Psychiatric Specialty Exam: Physical Exam  Nursing note and vitals reviewed. Constitutional: He appears well-developed and well-nourished.  HENT:  Head: Normocephalic and atraumatic.  Eyes: Conjunctivae are normal. Pupils are equal, round, and reactive to light.  Neck: Normal range of motion.  Cardiovascular: Regular rhythm and normal heart sounds.   Respiratory: Effort normal. No respiratory distress.  GI: Soft.  Musculoskeletal: Normal range of motion.  Neurological: He is alert.  Skin: Skin is warm and dry.  Psychiatric: His speech is normal and behavior is normal. Thought content normal. His mood appears not anxious. Cognition and memory are normal. He expresses impulsivity. He exhibits a depressed mood.    Review of Systems  Constitutional: Negative.   HENT: Negative.   Eyes: Negative.   Respiratory: Negative.   Cardiovascular: Negative.   Gastrointestinal: Negative.   Musculoskeletal: Negative.   Skin: Negative.   Neurological: Negative.   Psychiatric/Behavioral: Positive for depression, memory loss, substance abuse and suicidal ideas. Negative for hallucinations. The patient is nervous/anxious and has insomnia.     Blood pressure (!) 150/99, pulse 77, temperature 98 F (36.7 C), temperature source Oral, resp. rate 18, height _0  (1.676 m), weight 92.1 kg (203 lb), SpO2 97 %.Body mass index is 32.77 kg/m.  General Appearance: Casual  Eye Contact:  Fair  Speech:  Slow  Volume:  Decreased  Mood:  Dysphoric  Affect:  Congruent  Thought Process:  Goal Directed  Orientation:  Full (Time, Place, and Person)  Thought Content:  Tangential  Suicidal Thoughts:  Yes.  without intent/plan  Homicidal Thoughts:  No  Memory:  Immediate;   Fair Recent;   Fair Remote;   Poor  Judgement:  Impaired  Insight:  Shallow  Psychomotor Activity:  Normal  Concentration:  Concentration: Fair  Recall:  [employer] of Knowledge:  Fair  Language:  Fair  Akathisia:  No  Handed:  Right  AIMS (if indicated):     Assets:  Housing Physical Health  ADL's:  Impaired  Cognition:  Impaired,  Mild  Sleep:        Treatment Plan Summary: Plan 40 year old man with alcohol and cocaine abuse and substance-induced mood disorder has sobered up overnight. Physically appears to be stable. Affect slightly blunted but reactive ultimately. He denies any suicidal intent or plan right now although he remains hopeless. Patient is not likely to benefit any further from inpatient treatment. He quickly came to a baseline last time he was in the hospital and did not follow-up with recommended outpatient treatment. Supportive and educational counseling completed. Patient does not meet commitment criteria and can be released from the emergency room to follow-up with local resources for substance abuse in the community.  Disposition: Patient does not meet criteria for psychiatric inpatient admission. Supportive therapy provided about ongoing stressors.  Alethia Berthold, MD 07/02/2016 2:39 PM

## 2016-07-02 NOTE — ED Notes (Signed)
BEHAVIORAL HEALTH ROUNDING Patient sleeping: No. Patient alert and oriented: yes Behavior appropriate: Yes.  ; If no, describe:  Nutrition and fluids offered: yes Toileting and hygiene offered: Yes  Sitter present: q15 minute observations and security  monitoring Law enforcement present: Yes  ODS  

## 2016-07-02 NOTE — BH Assessment (Signed)
Assessment Note  Alex [name] is an 40 y.o. male presenting to the ED via [facility] police for suicidal ideations with a plan to jump off the bridge.  Patient states " I can't take it anymore and I need to come to hospital to stay a couple of days".  Pt reports wanting to be involuntarily committed to the unit downstairs.  Pt admits to using drinking alcohol and using cocaine tonight.  Patient was recently discharged from [facility] BMU 06/29/16.  Diagnosis: Alcohol abuse  Past Medical History:  Past Medical History:  Diagnosis Date  . Alcohol abuse   . Anxiety   . Cocaine abuse 05/27/2016    History reviewed. No pertinent surgical history.  Family History: History reviewed. No pertinent family history.  Social History:  reports that he has been smoking.  He has been smoking about 1.00 pack per day. He has never used smokeless tobacco. He reports that he drinks about 6.0 oz of alcohol per week . He reports that he uses drugs, including Marijuana and Cocaine.  Additional Social History:  Alcohol / Drug Use History of alcohol / drug use?: Yes Longest period of sobriety (when/how long): unknown Negative Consequences of Use: Personal relationships Withdrawal Symptoms: Agitation Substance #1 Name of Substance 1: alcohol 1 - Age of First Use: unknown 1 - Amount (size/oz): 40 oz bottles 1 - Frequency: daily 1 - Duration: varies 1 - Last Use / Amount: 07/02/16  CIWA: CIWA-Ar BP: 121/75 Pulse Rate: 87 COWS:    Allergies:  Allergies  Allergen Reactions  . Peanut Butter Flavor     Home Medications:  (Not in a hospital admission)  OB/GYN Status:  No LMP for male patient.  General Assessment Data Location of Assessment: [facility] ED TTS Assessment: In system Is this a Tele or Face-to-Face Assessment?: Face-to-Face Is this an Initial Assessment or a Re-assessment for this encounter?: Initial Assessment Marital status: Single Maiden name: n/a Is patient pregnant?: No Pregnancy Status:  No Living Arrangements: Parent Can pt return to current living arrangement?: Yes Admission Status: Involuntary Is patient capable of signing voluntary admission?: Yes Referral Source: Self/Family/Friend Insurance type: Medicaid  Medical Screening Exam [facility] Walk-in ONLY) Medical Exam completed: Yes  Crisis Care Plan Living Arrangements: Parent Legal Guardian: Other: (self) Name of Psychiatrist: N/A Name of Therapist: None  Education Status Is patient currently in school?: No Current Grade: n/a Highest grade of school patient has completed: 11th Name of school: n/a Contact person: N/A  Risk to self with the past 6 months Suicidal Ideation: Yes-Currently Present Has patient been a risk to self within the past 6 months prior to admission? : Yes Suicidal Intent: Yes-Currently Present Has patient had any suicidal intent within the past 6 months prior to admission? : Yes Is patient at risk for suicide?: No Suicidal Plan?: Yes-Currently Present Has patient had any suicidal plan within the past 6 months prior to admission? : Yes Specify Current Suicidal Plan: Pt reports plan to jump off bridge Access to Means: Yes Specify Access to Suicidal Means: access to bridge What has been your use of drugs/alcohol within the last 12 months?: alcohol  Previous Attempts/Gestures: Yes How many times?: 2 Other Self Harm Risks: None identified Triggers for Past Attempts: Unknown Intentional Self Injurious Behavior: None Family Suicide History: No Recent stressful life event(s): Financial Problems Persecutory voices/beliefs?: No Depression: Yes Depression Symptoms: Loss of interest in usual pleasures, Feeling worthless/self pity, Feeling angry/irritable Substance abuse history and/or treatment for substance abuse?: Yes Suicide  prevention information given to non-admitted patients: Not applicable  Risk to Others within the past 6 months Homicidal Ideation: No Does patient have any lifetime  risk of violence toward others beyond the six months prior to admission? : No Thoughts of Harm to Others: No Current Homicidal Intent: No Current Homicidal Plan: No Access to Homicidal Means: No Identified Victim: None identified History of harm to others?: No Assessment of Violence: None Noted Violent Behavior Description: None identified Does patient have access to weapons?: No Criminal Charges Pending?: No Does patient have a court date: No Is patient on probation?: No  Psychosis Hallucinations: None noted Delusions: None noted  Mental Status Report Appearance/Hygiene: Unremarkable Eye Contact: Good Motor Activity: Unremarkable Speech: Logical/coherent Level of Consciousness: Alert Mood: Depressed Affect: Anxious, Irritable Anxiety Level: Minimal Thought Processes: Relevant Judgement: Impaired Orientation: Person, Place, Time, Situation Obsessive Compulsive Thoughts/Behaviors: None  Cognitive Functioning Concentration: Normal Memory: Recent Intact, Remote Intact IQ: Average Insight: Poor Impulse Control: Poor Appetite: Good Weight Loss: 0 Weight Gain: 0 Sleep: No Change Total Hours of Sleep: 4 Vegetative Symptoms: None  ADLScreening [facility] Assessment Services) Patient's cognitive ability adequate to safely complete daily activities?: Yes Patient able to express need for assistance with ADLs?: Yes Independently performs ADLs?: Yes (appropriate for developmental age)  Prior Inpatient Therapy Prior Inpatient Therapy: Yes Prior Therapy Dates: 05/2016; 06/2016 [facility] Reason for Treatment: Depression; alcohol  Prior Outpatient Therapy Prior Outpatient Therapy: No Prior Therapy Dates: N/A Prior Therapy Facilty/Provider(s): N/A Reason for Treatment: N/A Does patient have an ACCT team?: No Does patient have Intensive In-House Services?  : No Does patient have [facility] services? : No Does patient have P4CC services?: No  ADL  Screening (condition at time of admission) Patient's cognitive ability adequate to safely complete daily activities?: Yes Patient able to express need for assistance with ADLs?: Yes Independently performs ADLs?: Yes (appropriate for developmental age)       Abuse/Neglect Assessment (Assessment to be complete while patient is alone) Physical Abuse: Denies Verbal Abuse: Denies Sexual Abuse: Denies Exploitation of patient/patient's resources: Denies Self-Neglect: Denies Values / Beliefs Cultural Requests During Hospitalization: None Spiritual Requests During Hospitalization: None Consults Spiritual Care Consult Needed: No Social Work Consult Needed: No Merchant navy officerAdvance Directives (For Healthcare) Does patient have an advance directive?: No Would patient like information on creating an advanced directive?: No - patient declined information    Additional Information 1:1 In Past 12 Months?: No CIRT Risk: No Elopement Risk: No Does patient have medical clearance?: No     Disposition:  Disposition Initial Assessment Completed for this Encounter: Yes Disposition of Patient: Other dispositions Other disposition(s): Other (Comment) (Pending Psych MD consult)  On Site Evaluation by:   Reviewed with Physician:    Alessandria Henken C Gwenette Wellons 07/02/2016 1:55 AM

## 2016-07-06 ENCOUNTER — Encounter: Payer: Self-pay | Admitting: Emergency Medicine

## 2016-07-06 ENCOUNTER — Emergency Department
Admission: EM | Admit: 2016-07-06 | Discharge: 2016-07-07 | Disposition: A | Discharge status: Home / Self Care | Payer: Medicaid Other | Attending: Emergency Medicine | Admitting: Emergency Medicine

## 2016-07-06 DIAGNOSIS — F1420 Cocaine dependence, uncomplicated: Secondary | ICD-10-CM | POA: Diagnosis present

## 2016-07-06 DIAGNOSIS — S80211A Abrasion, right knee, initial encounter: Secondary | ICD-10-CM | POA: Diagnosis not present

## 2016-07-06 DIAGNOSIS — S8991XA Unspecified injury of right lower leg, initial encounter: Secondary | ICD-10-CM | POA: Diagnosis present

## 2016-07-06 DIAGNOSIS — F1994 Other psychoactive substance use, unspecified with psychoactive substance-induced mood disorder: Secondary | ICD-10-CM

## 2016-07-06 DIAGNOSIS — Y9389 Activity, other specified: Secondary | ICD-10-CM | POA: Diagnosis not present

## 2016-07-06 DIAGNOSIS — Y999 Unspecified external cause status: Secondary | ICD-10-CM | POA: Insufficient documentation

## 2016-07-06 DIAGNOSIS — Z79899 Other long term (current) drug therapy: Secondary | ICD-10-CM | POA: Diagnosis not present

## 2016-07-06 DIAGNOSIS — F10120 Alcohol abuse with intoxication, uncomplicated: Secondary | ICD-10-CM | POA: Insufficient documentation

## 2016-07-06 DIAGNOSIS — Z23 Encounter for immunization: Secondary | ICD-10-CM | POA: Insufficient documentation

## 2016-07-06 DIAGNOSIS — F10939 Alcohol use, unspecified with withdrawal, unspecified: Secondary | ICD-10-CM | POA: Diagnosis present

## 2016-07-06 DIAGNOSIS — Y929 Unspecified place or not applicable: Secondary | ICD-10-CM | POA: Insufficient documentation

## 2016-07-06 DIAGNOSIS — F329 Major depressive disorder, single episode, unspecified: Secondary | ICD-10-CM | POA: Diagnosis present

## 2016-07-06 DIAGNOSIS — F1410 Cocaine abuse, uncomplicated: Secondary | ICD-10-CM

## 2016-07-06 DIAGNOSIS — F32A Depression, unspecified: Secondary | ICD-10-CM | POA: Diagnosis present

## 2016-07-06 DIAGNOSIS — Y30XXXA Falling, jumping or pushed from a high place, undetermined intent, initial encounter: Secondary | ICD-10-CM | POA: Diagnosis not present

## 2016-07-06 DIAGNOSIS — F17200 Nicotine dependence, unspecified, uncomplicated: Secondary | ICD-10-CM | POA: Diagnosis not present

## 2016-07-06 DIAGNOSIS — F10239 Alcohol dependence with withdrawal, unspecified: Secondary | ICD-10-CM | POA: Diagnosis present

## 2016-07-06 DIAGNOSIS — F10920 Alcohol use, unspecified with intoxication, uncomplicated: Secondary | ICD-10-CM

## 2016-07-06 DIAGNOSIS — F39 Unspecified mood [affective] disorder: Secondary | ICD-10-CM

## 2016-07-06 DIAGNOSIS — T148XXA Other injury of unspecified body region, initial encounter: Secondary | ICD-10-CM

## 2016-07-06 DIAGNOSIS — F1029 Alcohol dependence with unspecified alcohol-induced disorder: Secondary | ICD-10-CM | POA: Diagnosis present

## 2016-07-06 LAB — COMPREHENSIVE METABOLIC PANEL
ALK PHOS: 65 U/L (ref 38–126)
ALT: 16 U/L — AB (ref 17–63)
AST: 21 U/L (ref 15–41)
Albumin: 4.3 g/dL (ref 3.5–5.0)
Anion gap: 9 (ref 5–15)
BILIRUBIN TOTAL: 0.4 mg/dL (ref 0.3–1.2)
BUN: 8 mg/dL (ref 6–20)
CALCIUM: 8.8 mg/dL — AB (ref 8.9–10.3)
CHLORIDE: 107 mmol/L (ref 101–111)
CO2: 23 mmol/L (ref 22–32)
CREATININE: 0.72 mg/dL (ref 0.61–1.24)
Glucose, Bld: 95 mg/dL (ref 65–99)
Potassium: 3.9 mmol/L (ref 3.5–5.1)
Sodium: 139 mmol/L (ref 135–145)
TOTAL PROTEIN: 7.5 g/dL (ref 6.5–8.1)

## 2016-07-06 LAB — URINE DRUG SCREEN, QUALITATIVE (ARMC ONLY)
Amphetamines, Ur Screen: NOT DETECTED
BARBITURATES, UR SCREEN: NOT DETECTED
Benzodiazepine, Ur Scrn: NOT DETECTED
CANNABINOID 50 NG, UR ~~LOC~~: NOT DETECTED
Cocaine Metabolite,Ur ~~LOC~~: POSITIVE — AB
MDMA (Ecstasy)Ur Screen: NOT DETECTED
Methadone Scn, Ur: NOT DETECTED
Opiate, Ur Screen: NOT DETECTED
PHENCYCLIDINE (PCP) UR S: NOT DETECTED
TRICYCLIC, UR SCREEN: NOT DETECTED

## 2016-07-06 LAB — CBC
HCT: 46.6 % (ref 40.0–52.0)
Hemoglobin: 16.5 g/dL (ref 13.0–18.0)
MCH: 34.7 pg — AB (ref 26.0–34.0)
MCHC: 35.4 g/dL (ref 32.0–36.0)
MCV: 98 fL (ref 80.0–100.0)
PLATELETS: 153 10*3/uL (ref 150–440)
RBC: 4.75 MIL/uL (ref 4.40–5.90)
RDW: 13.8 % (ref 11.5–14.5)
WBC: 7.6 10*3/uL (ref 3.8–10.6)

## 2016-07-06 LAB — ACETAMINOPHEN LEVEL: Acetaminophen (Tylenol), Serum: <10 ug/mL — ABNORMAL LOW (ref 10–30)

## 2016-07-06 LAB — SALICYLATE LEVEL

## 2016-07-06 LAB — ETHANOL: ALCOHOL ETHYL (B): 275 mg/dL — AB (ref <5)

## 2016-07-06 MED ORDER — TETANUS-DIPHTH-ACELL PERTUSSIS 5-2.5-18.5 LF-MCG/0.5 IM SUSP
0.5000 mL | Freq: Once | INTRAMUSCULAR | Status: AC
  Administered 2016-07-06: 0.5 mL via INTRAMUSCULAR
  Filled 2016-07-06: qty 0.5

## 2016-07-06 MED ORDER — IBUPROFEN 600 MG PO TABS
600.0000 mg | ORAL_TABLET | Freq: Four times a day (QID) | ORAL | Status: DC | PRN
  Administered 2016-07-06 – 2016-07-07 (×4): 600 mg via ORAL
  Filled 2016-07-06 (×4): qty 1

## 2016-07-06 MED ORDER — PANTOPRAZOLE SODIUM 40 MG PO TBEC
40.0000 mg | DELAYED_RELEASE_TABLET | Freq: Every day | ORAL | Status: DC
  Administered 2016-07-06 – 2016-07-07 (×2): 40 mg via ORAL
  Filled 2016-07-06 (×2): qty 1

## 2016-07-06 NOTE — ED Notes (Signed)
Pt is alert and oriented this evening. Pt mood is irritable and demanding but he is ultimately cooperative. Pt is preoccupied with going downstairs to the BMU, but tx plan is still in progress. Writer provided food and drink, medications and 15 minute checks are ongoing for safety. Pt endorses passive SI but contracts for safety.

## 2016-07-06 NOTE — Consult Note (Signed)
[facility] Consult   Reason for Consult:  Consult for 40 year old man presented to the emergency room under involuntary commitment threatening to jump off a bridge. Intoxicated. Referring Physician:  Burlene Arnt Patient Identification: Alex [name] MRN:  [id number] Principal Diagnosis: Substance induced mood disorder [facility]) Diagnosis:   Patient Active Problem List   Diagnosis Date Noted  . Substance induced mood disorder ([place]) [F19.94] 07/02/2016  . Alcohol use disorder, moderate, dependence ([place]) [F10.20] 06/28/2016  . Alcohol withdrawal ([place]) [F10.239] 06/28/2016  . Cocaine use disorder, moderate, dependence ([place]) [F14.20] 06/28/2016  . Unspecified Depressive disorder [F32.9] 06/28/2016  . Tobacco use disorder [F17.200] 06/28/2016  . Noncompliance [Z91.19] 06/26/2016    Total Time spent with patient: 45 minutes  Subjective:   Alex [name] is a 40 y.o. male patient admitted with "I don't know I was trying to jump off a bridge".  HPI:  Patient interviewed. Chart reviewed. Brought in under a commitment filed by Event organiser. Police found him on a bridge down near his home last night halfway off of it threatening to jump. Police apparently had to forcibly wrestle him back into place. Patient tells me that he still has thoughts about wishing he were dead. Got into a fight with his brother yesterday. Can't think of any other reason why he is feeling so bad. He denies any connection between his alcohol and drug use and his bad mood. Claims that he only had "2 beers" yesterday. Blood alcohol level around 300. Drug screen positive for cocaine. Did not follow-up with any recommended outpatient treatment after last visit.  Social history: Patient lives with his mother down near [place]. 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 That's another one of his major stresses. Not working. Very little support.  Substance abuse history: Alcohol abuse. Gets pretty shaky with  withdrawal. Not clear if he has full DTs. Also abusing cocaine regularly.  Medical history: Complaining currently of back pain relating to the police dragging him off the bridge.  Past Psychiatric History: Patient has had inpatient hospitalizations more than once. On prior hospitalizations he has presented in the same condition but once hospitalized he retains a euthymic affect and has not been acting in a dangerous manner. His mood symptoms seem to be almost exclusively related to alcohol abuse. Has not clearly responded to medication. Doesn't follow-up with outpatient treatment. He has made multiple threats to kill himself.  Risk to Self: Suicidal Ideation: Yes-Currently Present Suicidal Intent: Yes-Currently Present Is patient at risk for suicide?: Yes Suicidal Plan?: Yes-Currently Present Specify Current Suicidal Plan: reports plan to jump off bridge Access to Means: Yes Specify Access to Suicidal Means: access to bridges What has been your use of drugs/alcohol within the last 12 months?: alcohol, cocaine How many times?: 2 Other Self Harm Risks: none Triggers for Past Attempts: Unknown Intentional Self Injurious Behavior: None Risk to Others: Homicidal Ideation: No Thoughts of Harm to Others: No Current Homicidal Intent: No Current Homicidal Plan: No Access to Homicidal Means: No Identified Victim: none  History of harm to others?: No Assessment of Violence: None Noted Violent Behavior Description: none noted Does patient have access to weapons?: No Criminal Charges Pending?: No Does patient have a court date: No Prior Inpatient Therapy: Prior Inpatient Therapy: Yes Prior Therapy Dates: 2017 Prior Therapy Facilty/Provider(s): [facility] Reason for Treatment: depression, alcohol Prior Outpatient Therapy: Prior Outpatient Therapy: No Prior Therapy Dates: n/a Prior Therapy Facilty/Provider(s): n/a Reason for Treatment: n/a Does patient have an ACCT team?: No  Does patient have  Intensive In-House Services?  : No Does patient have [facility] services? : No Does patient have P4CC services?: No  Past Medical History:  Past Medical History:  Diagnosis Date  . Alcohol abuse   . Anxiety   . Cocaine abuse 05/27/2016   History reviewed. No pertinent surgical history. Family History: No family history on file. Family Psychiatric  History: For alcohol abuse Social History:  History  Alcohol Use  . 6.0 oz/week  . 10 Cans of beer per week     History  Drug Use  . Types: Marijuana, Cocaine    Social History   Social History  . Marital status: Single    Spouse name: N/A  . Number of children: N/A  . Years of education: N/A   Social History Main Topics  . Smoking status: Current Every Day Smoker    Packs/day: 1.00  . Smokeless tobacco: Never Used  . Alcohol use 6.0 oz/week    10 Cans of beer per week  . Drug use:     Types: Marijuana, Cocaine  . Sexual activity: Yes    Birth control/ protection: None   Other Topics Concern  . None   Social History Narrative  . None   Additional Social History:    Allergies:   Allergies  Allergen Reactions  . Peanut Butter Flavor     Labs:  Results for orders placed or performed during the hospital encounter of 07/06/16 (from the past 48 hour(s))  Comprehensive metabolic panel     Status: Abnormal   Collection Time: 07/06/16 12:42 AM  Result Value Ref Range   Sodium 139 135 - 145 mmol/L   Potassium 3.9 3.5 - 5.1 mmol/L   Chloride 107 101 - 111 mmol/L   CO2 23 22 - 32 mmol/L   Glucose, Bld 95 65 - 99 mg/dL   BUN 8 6 - 20 mg/dL   Creatinine, Ser 0.72 0.61 - 1.24 mg/dL   Calcium 8.8 (L) 8.9 - 10.3 mg/dL   Total Protein 7.5 6.5 - 8.1 g/dL   Albumin 4.3 3.5 - 5.0 g/dL   AST 21 15 - 41 U/L   ALT 16 (L) 17 - 63 U/L   Alkaline Phosphatase 65 38 - 126 U/L   Total Bilirubin 0.4 0.3 - 1.2 mg/dL   GFR calc non Af Amer >60 >60 mL/min   GFR calc Af Amer >60 >60 mL/min    Comment: (NOTE) The eGFR has been  calculated using the CKD EPI equation. This calculation has not been validated in all clinical situations. eGFR's persistently <60 mL/min signify possible Chronic Kidney Disease.    Anion gap 9 5 - 15  Ethanol     Status: Abnormal   Collection Time: 07/06/16 12:42 AM  Result Value Ref Range   Alcohol, Ethyl (B) 275 (H) <5 mg/dL    Comment:        LOWEST DETECTABLE LIMIT FOR SERUM ALCOHOL IS 5 mg/dL FOR MEDICAL PURPOSES ONLY   Salicylate level     Status: None   Collection Time: 07/06/16 12:42 AM  Result Value Ref Range   Salicylate Lvl <6.0 2.8 - 30.0 mg/dL  Acetaminophen level     Status: Abnormal   Collection Time: 07/06/16 12:42 AM  Result Value Ref Range   Acetaminophen (Tylenol), Serum <10 (L) 10 - 30 ug/mL    Comment:        THERAPEUTIC CONCENTRATIONS VARY SIGNIFICANTLY. A RANGE OF 10-30 ug/mL MAY BE AN EFFECTIVE  CONCENTRATION FOR MANY PATIENTS. HOWEVER, SOME ARE BEST TREATED AT CONCENTRATIONS OUTSIDE THIS RANGE. ACETAMINOPHEN CONCENTRATIONS >150 ug/mL AT 4 HOURS AFTER INGESTION AND >50 ug/mL AT 12 HOURS AFTER INGESTION ARE OFTEN ASSOCIATED WITH TOXIC REACTIONS.   cbc     Status: Abnormal   Collection Time: 07/06/16 12:42 AM  Result Value Ref Range   WBC 7.6 3.8 - 10.6 K/uL   RBC 4.75 4.40 - 5.90 MIL/uL   Hemoglobin 16.5 13.0 - 18.0 g/dL   HCT 46.6 40.0 - 52.0 %   MCV 98.0 80.0 - 100.0 fL   MCH 34.7 (H) 26.0 - 34.0 pg   MCHC 35.4 32.0 - 36.0 g/dL   RDW 13.8 11.5 - 14.5 %   Platelets 153 150 - 440 K/uL  Urine Drug Screen, Qualitative     Status: Abnormal   Collection Time: 07/06/16 12:42 AM  Result Value Ref Range   Tricyclic, Ur Screen NONE DETECTED NONE DETECTED   Amphetamines, Ur Screen NONE DETECTED NONE DETECTED   MDMA (Ecstasy)Ur Screen NONE DETECTED NONE DETECTED   Cocaine Metabolite,Ur [place] POSITIVE (A) NONE DETECTED   Opiate, Ur Screen NONE DETECTED NONE DETECTED   Phencyclidine (PCP) Ur S NONE DETECTED NONE DETECTED   Cannabinoid 50 Ng, Ur [place]  NONE DETECTED NONE DETECTED   Barbiturates, Ur Screen NONE DETECTED NONE DETECTED   Benzodiazepine, Ur Scrn NONE DETECTED NONE DETECTED   Methadone Scn, Ur NONE DETECTED NONE DETECTED    Comment: (NOTE) 128  Tricyclics, urine               Cutoff 1000 ng/mL 200  Amphetamines, urine             Cutoff 1000 ng/mL 300  MDMA (Ecstasy), urine           Cutoff 500 ng/mL 400  Cocaine Metabolite, urine       Cutoff 300 ng/mL 500  Opiate, urine                   Cutoff 300 ng/mL 600  Phencyclidine (PCP), urine      Cutoff 25 ng/mL 700  Cannabinoid, urine              Cutoff 50 ng/mL 800  Barbiturates, urine             Cutoff 200 ng/mL 900  Benzodiazepine, urine           Cutoff 200 ng/mL 1000 Methadone, urine                Cutoff 300 ng/mL 1100 1200 The urine drug screen provides only a preliminary, unconfirmed 1300 analytical test result and should not be used for non-medical 1400 purposes. Clinical consideration and professional judgment should 1500 be applied to any positive drug screen result due to possible 1600 interfering substances. A more specific alternate chemical method 1700 must be used in order to obtain a confirmed analytical result.  1800 Gas chromato graphy / mass spectrometry (GC/MS) is the preferred 1900 confirmatory method.     No current facility-administered medications for this encounter.    Current Outpatient Prescriptions  Medication Sig Dispense Refill  . atomoxetine (STRATTERA) 40 MG capsule Take 1 capsule (40 mg total) by mouth daily. 30 capsule 0  . citalopram (CELEXA) 20 MG tablet Take 1 tablet (20 mg total) by mouth daily. 30 tablet 0  . Multiple Vitamin (MULTIVITAMIN WITH MINERALS) TABS tablet Take 1 tablet by mouth daily.    Marland Kitchen omeprazole (PRILOSEC) 20  MG capsule Take 20 mg by mouth 2 (two) times daily before a meal.    . traZODone (DESYREL) 150 MG tablet Take by mouth at bedtime.      Musculoskeletal: Strength & Muscle Tone: within normal limits Gait  & Station: broad based Patient leans: N/A  Psychiatric Specialty Exam: Physical Exam  Nursing note and vitals reviewed. Constitutional: He appears well-developed and well-nourished.  HENT:  Head: Normocephalic and atraumatic.  Eyes: Conjunctivae are normal. Pupils are equal, round, and reactive to light.  Neck: Normal range of motion.  Cardiovascular: Regular rhythm and normal heart sounds.   Respiratory: Effort normal. No respiratory distress.  GI: Soft.  Musculoskeletal: Normal range of motion.  Neurological: He is alert.  Skin: Skin is warm and dry.  Psychiatric: His affect is blunt. His speech is delayed. He is slowed and withdrawn. He expresses impulsivity. He exhibits a depressed mood. He expresses suicidal ideation. He exhibits abnormal recent memory and abnormal remote memory.    Review of Systems  Constitutional: Negative.   HENT: Negative.   Eyes: Negative.   Respiratory: Negative.   Cardiovascular: Negative.   Gastrointestinal: Negative.   Musculoskeletal: Positive for back pain.  Skin: Negative.   Neurological: Negative.   Psychiatric/Behavioral: Positive for depression, memory loss, substance abuse and suicidal ideas. Negative for hallucinations. The patient is nervous/anxious and has insomnia.     Blood pressure (!) 141/98, pulse 89, temperature 97.9 F (36.6 C), temperature source Oral, resp. rate 20, height 5' 6"  (1.676 m), weight 90.7 kg (200 lb), SpO2 97 %.Body mass index is 32.28 kg/m.  General Appearance: Disheveled  Eye Contact:  Fair  Speech:  Slow  Volume:  Decreased  Mood:  Depressed  Affect:  Congruent  Thought Process:  Goal Directed  Orientation:  Full (Time, Place, and Person)  Thought Content:  Rumination  Suicidal Thoughts:  Yes.  with intent/plan  Homicidal Thoughts:  No  Memory:  Immediate;   Fair Recent;   Poor Remote;   Fair  Judgement:  Impaired  Insight:  Lacking  Psychomotor Activity:  Decreased  Concentration:  Concentration:  Poor  Recall:  [employer] of Knowledge:  Fair  Language:  Fair  Akathisia:  No  Handed:  Right  AIMS (if indicated):     Assets:  Communication Skills Desire for Improvement Housing Physical Health  ADL's:  Impaired  Cognition:  Impaired,  Mild  Sleep:        Treatment Plan Summary: Daily contact with patient to assess and evaluate symptoms and progress in treatment, Medication management and Plan Patient presents intoxicated with suicidal ideation. On reevaluation today he is still sluggish and still talking about how he wishes he would die. Feels very hopeless. Patient has relatively low likelihood of benefiting from inpatient hospitalization but is also very likely to continue acting out and possibly kill himself. I will consider the possibility of admission depending on bed availability. Meanwhile continue monitoring on the emergency room psychiatry unit for detox signs. Review plan with patient. Continue reevaluation for suicidality.  Disposition: Recommend psychiatric Inpatient admission when medically cleared. Supportive therapy provided about ongoing stressors.  Alethia Berthold, MD 07/06/2016 3:45 PM

## 2016-07-06 NOTE — BH Assessment (Signed)
Tele Assessment Note   Alex [name] is an 40 y.o. male, Caucasian, Single who [facility]C after attempt/plan to jump off bridge and was found intoxicated. Patient states that primary concern is of S.A. And trying to become sober. Patient has hx. Of hospitalizations with similar complaints and attempts/ threat to jump off bridge as well as intoxication. Patient states that he currently resides with mother. Patient acknowledges current SI with plan to jump off bridge. Patient denies current SI or AVH. Patient acknowledges hx. Of S.A with alcohol as primary with unknown quantity of alcohol consumed daily with last use on 07/05/16, and cocaine use with last use on 07/05/16 for unknown amounts. Patient has been [facility] just recently and was d/c stating he missed an appt. And did not follow up upon d/c.  Patient is dressed in scrubs and is alert and oriented x4. Patient speech was within normal limits and motor behavior appeared normal. Patient thought process is coherent. Patient  does not appear to be responding to internal stimuli. Patient was cooperative throughout the assessment.   Diagnosis: Substance Induced Mood Disorder; Alcohol Use Disorder, Severe; Cocaine Use Disorder, Severe  Past Medical History:  Past Medical History:  Diagnosis Date  . Alcohol abuse   . Anxiety   . Cocaine abuse 05/27/2016    History reviewed. No pertinent surgical history.  Family History: No family history on file.  Social History:  reports that he has been smoking.  He has been smoking about 1.00 pack per day. He has never used smokeless tobacco. He reports that he drinks about 6.0 oz of alcohol per week . He reports that he uses drugs, including Marijuana and Cocaine.  Additional Social History:     CIWA: CIWA-Ar BP: 117/75 Pulse Rate: 79 COWS:    PATIENT STRENGTHS: (choose at least two) Ability for insight Active sense of humor Average or above average  intelligence  Allergies:  Allergies  Allergen Reactions  . Peanut Butter Flavor     Home Medications:  (Not in a hospital admission)  OB/GYN Status:  No LMP for male patient.  General Assessment Data [facility] Assessment: In system Is this a Tele or Face-to-Face Assessment?: Face-to-Face Is this an Initial Assessment or a Re-assessment for this encounter?: Initial Assessment Marital status: Single Maiden name: n/a Is patient pregnant?: No Pregnancy Status: No Living Arrangements: Parent Can pt return to current living arrangement?: Yes Admission Status: Involuntary Is patient capable of signing voluntary admission?: Yes Referral Source: Other Insurance type: Medicaid     Crisis Care Plan Living Arrangements: Parent Legal Guardian: Other: Name of Psychiatrist: none Name of Therapist: none  Education Status Is patient currently in school?: No Current Grade: n/a Highest grade of school patient has completed: 11th Name of school: n/a Contact person: n/a  Risk to self with the past 6 months Suicidal Ideation: Yes-Currently Present Has patient been a risk to self within the past 6 months prior to admission? : Yes Suicidal Intent: Yes-Currently Present Has patient had any suicidal intent within the past 6 months prior to admission? : Yes Is patient at risk for suicide?: Yes Suicidal Plan?: Yes-Currently Present Has patient had any suicidal plan within the past 6 months prior to admission? : Yes Specify Current Suicidal Plan: reports plan to jump off bridge Access to Means: Yes Specify Access to Suicidal Means: access to bridges What has been your use of drugs/alcohol within the last  12 months?: alcohol, cocaine Previous Attempts/Gestures: Yes How many times?: 2 Other Self Harm Risks: none Triggers for Past Attempts: Unknown Intentional Self Injurious Behavior: None Family Suicide History: No Recent stressful life event(s): Turmoil  (Comment) Persecutory voices/beliefs?: No Depression: Yes Depression Symptoms: Despondent, Insomnia, Tearfulness, Isolating, Fatigue, Guilt, Loss of interest in usual pleasures, Feeling worthless/self pity Substance abuse history and/or treatment for substance abuse?: Yes Suicide prevention information given to non-admitted patients: Yes  Risk to Others within the past 6 months Homicidal Ideation: No Does patient have any lifetime risk of violence toward others beyond the six months prior to admission? : No Thoughts of Harm to Others: No Current Homicidal Intent: No Current Homicidal Plan: No Access to Homicidal Means: No Identified Victim: none  History of harm to others?: No Assessment of Violence: None Noted Violent Behavior Description: none noted Does patient have access to weapons?: No Criminal Charges Pending?: No Does patient have a court date: No Is patient on probation?: No  Psychosis Hallucinations: None noted Delusions: None noted  Mental Status Report Appearance/Hygiene: In scrubs Eye Contact: Good Motor Activity: Agitation Speech: Logical/coherent Level of Consciousness: Alert Mood: Depressed Affect: Anxious Anxiety Level: Minimal Thought Processes: Relevant Judgement: Impaired Orientation: Person, Place, Time, Situation, Appropriate for developmental age Obsessive Compulsive Thoughts/Behaviors: None  Cognitive Functioning Concentration: Normal Memory: Recent Intact, Remote Intact IQ: Average Insight: Poor Impulse Control: Poor Appetite: Good Weight Loss: 0 Weight Gain: 0 Sleep: No Change Total Hours of Sleep: 4 Vegetative Symptoms: None  ADLScreening [facility]) Patient's cognitive ability adequate to safely complete daily activities?: Yes Patient able to express need for assistance with ADLs?: Yes Independently performs ADLs?: Yes (appropriate for developmental age)  Prior Inpatient Therapy Prior Inpatient Therapy: Yes Prior  Therapy Dates: 2017 Prior Therapy Facilty/Provider(s): [facility] Reason for Treatment: depression, alcohol  Prior Outpatient Therapy Prior Outpatient Therapy: No Prior Therapy Dates: n/a Prior Therapy Facilty/Provider(s): n/a Reason for Treatment: n/a Does patient have an ACCT team?: No Does patient have Intensive In-House Services?  : No Does patient have [facility] services? : No Does patient have P4CC services?: No  ADL Screening (condition at time of admission) Patient's cognitive ability adequate to safely complete daily activities?: Yes Is the patient deaf or have difficulty hearing?: No Does the patient have difficulty seeing, even when wearing glasses/contacts?: No Does the patient have difficulty concentrating, remembering, or making decisions?: No Patient able to express need for assistance with ADLs?: Yes Does the patient have difficulty dressing or bathing?: No Independently performs ADLs?: Yes (appropriate for developmental age) Does the patient have difficulty walking or climbing stairs?: No Weakness of Legs: None Weakness of Arms/Hands: None  Home Assistive Devices/Equipment Home Assistive Devices/Equipment: None    Abuse/Neglect Assessment (Assessment to be complete while patient is alone) Physical Abuse: Denies Verbal Abuse: Denies Sexual Abuse: Denies Exploitation of patient/patient's resources: Denies Self-Neglect: Denies Values / Beliefs Cultural Requests During Hospitalization: None Spiritual Requests During Hospitalization: None   Advance Directives (For Healthcare) Does patient have an advance directive?: No Would patient like information on creating an advanced directive?: No - patient declined information    Additional Information 1:1 In Past 12 Months?: No CIRT Risk: No Elopement Risk: No Does patient have medical clearance?: Yes     Disposition:  Disposition Initial Assessment Completed for this Encounter: Yes Disposition of Patient: Other  dispositions (TBD)  Alex [name] 07/06/2016 1:45 PM

## 2016-07-06 NOTE — ED Notes (Signed)
PT  IVC  SEEN  BY  DR  CLAPACS  PENDING  PLACEMENT 

## 2016-07-06 NOTE — ED Notes (Signed)
Patient assigned to appropriate care area. Patient oriented to unit/care area: Informed that, for their safety, care areas are designed for safety and monitored by security cameras at all times; and visiting hours explained to patient. Patient verbalizes understanding, and verbal contract for safety obtained. 

## 2016-07-06 NOTE — ED Notes (Signed)
Pt requested for writer to check his belongings bag to see if his cell phone was there, Clinical research associatewriter checked through his belongings and found no cell phone, notified patient, patient will call family to try and locate his cell phone.

## 2016-07-06 NOTE — ED Provider Notes (Signed)
[facility] Emergency Department Provider Note   ____________________________________________   First MD Initiated Contact with Patient 07/06/16 0113     (approximate)  I have reviewed the triage vital signs and the nursing notes.   HISTORY  Chief Complaint Mental Health Problem    HPI [name] is [name] 40 y.o. male brought to the ED under police custody for IVC. Patient is [name] frequent visitor to the emergency department he [name], threatening to jump. He was pulled to the ground by [name] police officer and sustained [name] right knee abrasion the process. Endorses SI. Denies HI/AH/VH. Voices no medical complaints other than right knee pain.Tetanus is not up-to-date.   Past Medical History:  Diagnosis Date  . Alcohol abuse   . Anxiety   . Cocaine abuse 05/27/2016    Patient Active Problem List   Diagnosis Date Noted  . Substance induced mood disorder (HCC) 07/02/2016  . Alcohol use disorder, moderate, dependence (HCC) 06/28/2016  . Alcohol withdrawal (HCC) 06/28/2016  . Cocaine use disorder, moderate, dependence (HCC) 06/28/2016  . Unspecified Depressive disorder 06/28/2016  . Tobacco use disorder 06/28/2016  . Noncompliance 06/26/2016    History reviewed. No pertinent surgical history.  Prior to Admission medications   Medication Sig Start Date End Date Taking? Authorizing Provider  atomoxetine (STRATTERA) 40 MG capsule Take 1 capsule (40 mg total) by mouth daily. 06/29/16  Yes Jimmy FootmanAndrea Hernandez-Gonzalez, MD  citalopram (CELEXA) 20 MG tablet Take 1 tablet (20 mg total) by mouth daily. 06/30/16  Yes Jimmy FootmanAndrea Hernandez-Gonzalez, MD  Multiple Vitamin (MULTIVITAMIN WITH MINERALS) TABS tablet Take 1 tablet by mouth daily.   Yes Historical Provider, MD  omeprazole (PRILOSEC) 20 MG capsule Take 20 mg by mouth 2 (two) times daily before [name] meal.   Yes Historical Provider, MD  traZODone (DESYREL) 150 MG tablet Take by mouth at bedtime.    Yes Historical Provider, MD    Allergies Peanut butter flavor  No family history on file.  Social History Social History  Substance Use Topics  . Smoking status: Current Every Day Smoker    Packs/day: 1.00  . Smokeless tobacco: Never Used  . Alcohol use 6.0 oz/week    10 Cans of beer per week    Review of Systems  Constitutional: No fever/chills. Eyes: No visual changes. ENT: No sore throat. Cardiovascular: Denies chest pain. Respiratory: Denies shortness of breath. Gastrointestinal: No abdominal pain.  No nausea, no vomiting.  No diarrhea.  No constipation. Genitourinary: Negative for dysuria. Musculoskeletal: Negative for back pain. Skin: Negative for rash. Neurological: Negative for headaches, focal weakness or numbness. Psychiatric:Positive for SI.  10-point ROS otherwise negative.  ____________________________________________   PHYSICAL EXAM:  VITAL SIGNS: ED Triage Vitals [07/06/16 0036]  Enc Vitals Group     BP 123/87     Pulse Rate 81     Resp 20     Temp 97.5 F (36.4 C)     Temp Source Oral     SpO2 97 %     Weight 200 lb (90.7 kg)     Height 5\' 6"  (1.676 m)     Head Circumference      Peak Flow      Pain Score      Pain Loc      Pain Edu?      Excl. in GC?     Constitutional: Alert and oriented. Well appearing and in no acute distress. Intoxicated. Eyes: Conjunctivae are normal.  PERRL. EOMI. Head: Atraumatic. Nose: No congestion/rhinnorhea. Mouth/Throat: Mucous membranes are moist.  Oropharynx non-erythematous. Neck: No stridor.   Cardiovascular: Normal rate, regular rhythm. Grossly normal heart sounds.  Good peripheral circulation. Respiratory: Normal respiratory effort.  No retractions. Lungs CTAB. Gastrointestinal: Soft and nontender. No distention. No abdominal bruits. No CVA tenderness.  Musculoskeletal: Right anterior knee with superficial abrasion, no active bleeding..  No joint effusions. Neurologic:  Normal speech and language.  No gross focal neurologic deficits are appreciated. No gait instability. Skin:  Skin is warm, dry and intact. No rash noted. Psychiatric: Mood and affect are flat. Speech and behavior are normal.  ____________________________________________   LABS (all labs ordered are listed, but only abnormal results are displayed)  Labs Reviewed  COMPREHENSIVE METABOLIC PANEL - Abnormal; Notable for the following:       Result Value   Calcium 8.8 (*)    ALT 16 (*)    All other components within normal limits  ETHANOL - Abnormal; Notable for the following:    Alcohol, Ethyl (B) 275 (*)    All other components within normal limits  ACETAMINOPHEN LEVEL - Abnormal; Notable for the following:    Acetaminophen (Tylenol), Serum <10 (*)    All other components within normal limits  CBC - Abnormal; Notable for the following:    MCH 34.7 (*)    All other components within normal limits  URINE DRUG SCREEN, QUALITATIVE ([facility] ONLY) - Abnormal; Notable for the following:    Cocaine Metabolite,Ur [place] POSITIVE (*)    All other components within normal limits  SALICYLATE LEVEL   ____________________________________________  EKG  None ____________________________________________  RADIOLOGY  None ____________________________________________   PROCEDURES  Procedure(s) performed: None  Procedures  Critical Care performed: No  ____________________________________________   INITIAL IMPRESSION / ASSESSMENT AND PLAN / ED COURSE  Pertinent labs & imaging results that were available during my care of the patient were reviewed by me and considered in my medical decision making (see chart for details).  40 year old male with substance induced mood disorder, history of alcohol abuse, cocaine use who presents under IVC for suicidal ideation. Will maintain IVC pending TTS and psychiatry consults.  Clinical Course  Comment By Time  No events overnight. Patient was transferred to the [facility] where he will  remain under IVC pending psychiatry consult today. Irean Hong, MD 10/10 [phone]     ____________________________________________   FINAL CLINICAL IMPRESSION(S) / ED DIAGNOSES  Final diagnoses:  Mood disorder (HCC)  Alcoholic intoxication without complication (HCC)  Cocaine abuse  Abrasion      NEW MEDICATIONS STARTED DURING THIS VISIT:  New Prescriptions   No medications on file     Note:  This document was prepared using Dragon voice recognition software and may include unintentional dictation errors.    Irean Hong, MD 07/06/16 [phone]

## 2016-07-06 NOTE — ED Triage Notes (Signed)
Pt ambulatory to triage, [place] [facility] [place] [facility] [place]; pt found on bridge tonight P[place] threatening to jump; +ETOH

## 2016-07-06 NOTE — ED Notes (Signed)
Patient given meal tray.

## 2016-07-06 NOTE — ED Notes (Signed)
Pt is alert and oriented on admission. Pt mood is labile/irritable but he is redirectable at this time. Pt endorses passive SI but also contracts for safety. Pt states that he does not want to meet with Dr[facility] Toni [facility]acs [facility]. Writer oriented pt to the [facility] and 15 minute checks are ongoing for safety. Pt went to sleep upon arrival to the unit.

## 2016-07-07 NOTE — Consult Note (Signed)
[facility] Consult   Reason for Consult:  Consult for 40 year old man with history of substance abuse and repeated threats to kill himself Referring Physician:  Reita Cliche Patient Identification: Alex [name] MRN:  [id number] Principal Diagnosis: Substance induced mood disorder [facility]) Diagnosis:   Patient Active Problem List   Diagnosis Date Noted  . Substance induced mood disorder ([place]) [F19.94] 07/02/2016  . Alcohol use disorder, moderate, dependence ([place]) [F10.20] 06/28/2016  . Alcohol withdrawal ([place]) [F10.239] 06/28/2016  . Cocaine use disorder, moderate, dependence ([place]) [F14.20] 06/28/2016  . Unspecified Depressive disorder [F32.9] 06/28/2016  . Tobacco use disorder [F17.200] 06/28/2016  . Noncompliance [Z91.19] 06/26/2016    Total Time spent with patient: 20 minutes  Subjective:   Alex [name] is a 40 y.o. male patient admitted with "I'm ready to go home".  HPI:  This a follow-up evaluation for this 40 year old man who has been in the hospital emergency room for a couple days. He came in intoxicated and having threatened to jump off a bridge. Patient admitted that he had been drinking and been still using cocaine. Here in the emergency room he has not done anything dangerous to harm himself or been aggressive to others. He has detoxed without any difficulty. He is medically stable. Patient's affect has been mildly irritable not sad. Today he tells me that he feels ready to go home. He says he has no plan or intention of trying to harm himself. Patient is lucid and does not appear to be psychotic. He understands that staying off alcohol and drugs would be crucial to his ongoing safety.  Past Psychiatric History: Multiple presentations to the hospital in similar circumstances intoxicated and threatening to kill himself. Never really engaged in substance abuse treatment. No clear evidence that he benefits from medication given his failure to stay sober  Risk to Self:  Suicidal Ideation: Yes-Currently Present Suicidal Intent: Yes-Currently Present Is patient at risk for suicide?: Yes Suicidal Plan?: Yes-Currently Present Specify Current Suicidal Plan: reports plan to jump off bridge Access to Means: Yes Specify Access to Suicidal Means: access to bridges What has been your use of drugs/alcohol within the last 12 months?: alcohol, cocaine How many times?: 2 Other Self Harm Risks: none Triggers for Past Attempts: Unknown Intentional Self Injurious Behavior: None Risk to Others: Homicidal Ideation: No Thoughts of Harm to Others: No Current Homicidal Intent: No Current Homicidal Plan: No Access to Homicidal Means: No Identified Victim: none  History of harm to others?: No Assessment of Violence: None Noted Violent Behavior Description: none noted Does patient have access to weapons?: No Criminal Charges Pending?: No Does patient have a court date: No Prior Inpatient Therapy: Prior Inpatient Therapy: Yes Prior Therapy Dates: 2017 Prior Therapy Facilty/Provider(s): [facility] Reason for Treatment: depression, alcohol Prior Outpatient Therapy: Prior Outpatient Therapy: No Prior Therapy Dates: n/a Prior Therapy Facilty/Provider(s): n/a Reason for Treatment: n/a Does patient have an ACCT team?: No Does patient have Intensive In-House Services?  : No Does patient have [facility] services? : No Does patient have P4CC services?: No  Past Medical History:  Past Medical History:  Diagnosis Date  . Alcohol abuse   . Anxiety   . Cocaine abuse 05/27/2016   History reviewed. No pertinent surgical history. Family History: No family history on file. Family Psychiatric  History: Unknown Social History:  History  Alcohol Use  . 6.0 oz/week  . 10 Cans of beer per week     History  Drug Use  . Types: Marijuana, Cocaine  Social History   Social History  . Marital status: Single    Spouse name: N/A  . Number of children: N/A  . Years of education: N/A    Social History Main Topics  . Smoking status: Current Every Day Smoker    Packs/day: 1.00  . Smokeless tobacco: Never Used  . Alcohol use 6.0 oz/week    10 Cans of beer per week  . Drug use:     Types: Marijuana, Cocaine  . Sexual activity: Yes    Birth control/ protection: None   Other Topics Concern  . None   Social History Narrative  . None   Additional Social History:    Allergies:   Allergies  Allergen Reactions  . Peanut Butter Flavor     Labs:  Results for orders placed or performed during the hospital encounter of 07/06/16 (from the past 48 hour(s))  Comprehensive metabolic panel     Status: Abnormal   Collection Time: 07/06/16 12:42 AM  Result Value Ref Range   Sodium 139 135 - 145 mmol/L   Potassium 3.9 3.5 - 5.1 mmol/L   Chloride 107 101 - 111 mmol/L   CO2 23 22 - 32 mmol/L   Glucose, Bld 95 65 - 99 mg/dL   BUN 8 6 - 20 mg/dL   Creatinine, Ser 0.72 0.61 - 1.24 mg/dL   Calcium 8.8 (L) 8.9 - 10.3 mg/dL   Total Protein 7.5 6.5 - 8.1 g/dL   Albumin 4.3 3.5 - 5.0 g/dL   AST 21 15 - 41 U/L   ALT 16 (L) 17 - 63 U/L   Alkaline Phosphatase 65 38 - 126 U/L   Total Bilirubin 0.4 0.3 - 1.2 mg/dL   GFR calc non Af Amer >60 >60 mL/min   GFR calc Af Amer >60 >60 mL/min    Comment: (NOTE) The eGFR has been calculated using the CKD EPI equation. This calculation has not been validated in all clinical situations. eGFR's persistently <60 mL/min signify possible Chronic Kidney Disease.    Anion gap 9 5 - 15  Ethanol     Status: Abnormal   Collection Time: 07/06/16 12:42 AM  Result Value Ref Range   Alcohol, Ethyl (B) 275 (H) <5 mg/dL    Comment:        LOWEST DETECTABLE LIMIT FOR SERUM ALCOHOL IS 5 mg/dL FOR MEDICAL PURPOSES ONLY   Salicylate level     Status: None   Collection Time: 07/06/16 12:42 AM  Result Value Ref Range   Salicylate Lvl <9.1 2.8 - 30.0 mg/dL  Acetaminophen level     Status: Abnormal   Collection Time: 07/06/16 12:42 AM  Result  Value Ref Range   Acetaminophen (Tylenol), Serum <10 (L) 10 - 30 ug/mL    Comment:        THERAPEUTIC CONCENTRATIONS VARY SIGNIFICANTLY. A RANGE OF 10-30 ug/mL MAY BE AN EFFECTIVE CONCENTRATION FOR MANY PATIENTS. HOWEVER, SOME ARE BEST TREATED AT CONCENTRATIONS OUTSIDE THIS RANGE. ACETAMINOPHEN CONCENTRATIONS >150 ug/mL AT 4 HOURS AFTER INGESTION AND >50 ug/mL AT 12 HOURS AFTER INGESTION ARE OFTEN ASSOCIATED WITH TOXIC REACTIONS.   cbc     Status: Abnormal   Collection Time: 07/06/16 12:42 AM  Result Value Ref Range   WBC 7.6 3.8 - 10.6 K/uL   RBC 4.75 4.40 - 5.90 MIL/uL   Hemoglobin 16.5 13.0 - 18.0 g/dL   HCT 46.6 40.0 - 52.0 %   MCV 98.0 80.0 - 100.0 fL   MCH 34.7 (H) 26.0 -  34.0 pg   MCHC 35.4 32.0 - 36.0 g/dL   RDW 13.8 11.5 - 14.5 %   Platelets 153 150 - 440 K/uL  Urine Drug Screen, Qualitative     Status: Abnormal   Collection Time: 07/06/16 12:42 AM  Result Value Ref Range   Tricyclic, Ur Screen NONE DETECTED NONE DETECTED   Amphetamines, Ur Screen NONE DETECTED NONE DETECTED   MDMA (Ecstasy)Ur Screen NONE DETECTED NONE DETECTED   Cocaine Metabolite,Ur [place] POSITIVE (A) NONE DETECTED   Opiate, Ur Screen NONE DETECTED NONE DETECTED   Phencyclidine (PCP) Ur S NONE DETECTED NONE DETECTED   Cannabinoid 50 Ng, Ur [place] NONE DETECTED NONE DETECTED   Barbiturates, Ur Screen NONE DETECTED NONE DETECTED   Benzodiazepine, Ur Scrn NONE DETECTED NONE DETECTED   Methadone Scn, Ur NONE DETECTED NONE DETECTED    Comment: (NOTE) 631  Tricyclics, urine               Cutoff 1000 ng/mL 200  Amphetamines, urine             Cutoff 1000 ng/mL 300  MDMA (Ecstasy), urine           Cutoff 500 ng/mL 400  Cocaine Metabolite, urine       Cutoff 300 ng/mL 500  Opiate, urine                   Cutoff 300 ng/mL 600  Phencyclidine (PCP), urine      Cutoff 25 ng/mL 700  Cannabinoid, urine              Cutoff 50 ng/mL 800  Barbiturates, urine             Cutoff 200 ng/mL 900  Benzodiazepine, urine            Cutoff 200 ng/mL 1000 Methadone, urine                Cutoff 300 ng/mL 1100 1200 The urine drug screen provides only a preliminary, unconfirmed 1300 analytical test result and should not be used for non-medical 1400 purposes. Clinical consideration and professional judgment should 1500 be applied to any positive drug screen result due to possible 1600 interfering substances. A more specific alternate chemical method 1700 must be used in order to obtain a confirmed analytical result.  1800 Gas chromato graphy / mass spectrometry (GC/MS) is the preferred 1900 confirmatory method.     Current Facility-Administered Medications  Medication Dose Route Frequency Provider Last Rate Last Dose  . ibuprofen (ADVIL,MOTRIN) tablet 600 mg  600 mg Oral Q6H PRN Gonzella Lex, MD   600 mg at 07/07/16 1404  . pantoprazole (PROTONIX) EC tablet 40 mg  40 mg Oral Daily Gonzella Lex, MD   40 mg at 07/07/16 4970   Current Outpatient Prescriptions  Medication Sig Dispense Refill  . atomoxetine (STRATTERA) 40 MG capsule Take 1 capsule (40 mg total) by mouth daily. 30 capsule 0  . citalopram (CELEXA) 20 MG tablet Take 1 tablet (20 mg total) by mouth daily. 30 tablet 0  . Multiple Vitamin (MULTIVITAMIN WITH MINERALS) TABS tablet Take 1 tablet by mouth daily.    Marland Kitchen omeprazole (PRILOSEC) 20 MG capsule Take 20 mg by mouth 2 (two) times daily before a meal.    . traZODone (DESYREL) 150 MG tablet Take by mouth at bedtime.      Musculoskeletal: Strength & Muscle Tone: within normal limits Gait & Station: normal Patient leans: N/A  Psychiatric Specialty Exam:  Physical Exam  Nursing note and vitals reviewed. Constitutional: He appears well-developed and well-nourished.  HENT:  Head: Normocephalic and atraumatic.  Eyes: Conjunctivae are normal. Pupils are equal, round, and reactive to light.  Neck: Normal range of motion.  Cardiovascular: Regular rhythm and normal heart sounds.   Respiratory:  Effort normal. No respiratory distress.  GI: Soft.  Musculoskeletal: Normal range of motion.  Neurological: He is alert.  Skin: Skin is warm and dry.  Psychiatric: His speech is normal. His affect is labile. He is agitated. Thought content is not paranoid. He expresses impulsivity. He expresses no homicidal and no suicidal ideation. He exhibits normal recent memory.    Review of Systems  Constitutional: Negative.   HENT: Negative.   Eyes: Negative.   Respiratory: Negative.   Cardiovascular: Negative.   Gastrointestinal: Negative.   Musculoskeletal: Negative.   Skin: Negative.   Neurological: Negative.   Psychiatric/Behavioral: Positive for substance abuse. Negative for depression, hallucinations, memory loss and suicidal ideas. The patient is not nervous/anxious and does not have insomnia.     Blood pressure 131/80, pulse (!) 55, temperature 97.7 F (36.5 C), temperature source Oral, resp. rate 18, height 5' 6"  (1.676 m), weight 90.7 kg (200 lb), SpO2 98 %.Body mass index is 32.28 kg/m.  General Appearance: Casual  Eye Contact:  Good  Speech:  Clear and Coherent  Volume:  Increased  Mood:  Irritable  Affect:  Congruent  Thought Process:  Goal Directed  Orientation:  Full (Time, Place, and Person)  Thought Content:  Logical  Suicidal Thoughts:  No  Homicidal Thoughts:  No  Memory:  Immediate;   Fair Recent;   Fair Remote;   Fair  Judgement:  Fair  Insight:  Fair  Psychomotor Activity:  Decreased  Concentration:  Concentration: Fair  Recall:  [employer] of Knowledge:  Fair  Language:  Fair  Akathisia:  No  Handed:  Right  AIMS (if indicated):     Assets:  Communication Skills Desire for Improvement Housing Physical Health  ADL's:  Intact  Cognition:  WNL  Sleep:        Treatment Plan Summary: Plan 40 year old man with substance induced mood disorder alcohol and cocaine abuse. Totally denies any suicidal ideation. Able to articulate positive plan for the future.  Patient understands that he needs to really make an effort to get involved in appropriate outpatient substance abuse treatment. No longer meets commitment criteria. Discontinue IVC. Patient can be released from the emergency room to follow-up with local mental health. Case reviewed with emergency room doctor and TTS and nursing.  Disposition: No evidence of imminent risk to self or others at present.   Patient does not meet criteria for psychiatric inpatient admission.  Alethia Berthold, MD 07/07/2016 2:33 PM

## 2016-07-07 NOTE — ED Provider Notes (Signed)
-----------------------------------------   7:34 AM on 07/07/2016 -----------------------------------------  Vitals:   07/06/16 1959 07/07/16 0636  BP: 136/80 131/80  Pulse: 63 (!) 55  Resp: 20 18  Temp: 98.2 F (36.8 C) 97.7 F (36.5 C)   No acute events reported to me overnight.  He is under IVC, awaiting psychiatric disposition.   Governor Rooksebecca Xzavien Harada, MD 07/07/16 [phone]

## 2016-07-07 NOTE — ED Provider Notes (Signed)
I spoke with Dr. Toni Amendlapacs, psychiatry is to release patient's IVC and plans to have him discharged. Apparently the patient decided he wanted to leave without waiting for discharge paperwork and Dr. Toni Amendlapacs was there and agreed to that. I did not see him prior to discharge nor complete his discharge instructions.   Governor Rooksebecca Terion Hedman, MD 07/07/16 1455

## 2016-07-07 NOTE — ED Notes (Signed)
Pt demanding to "leave right now", reports that he is no longer suicidal and wants to be discharged, belongings returned to patient, pt refused to wait for discharge papers to be printed, IVC rescinded per MD.

## 2016-07-18 ENCOUNTER — Encounter: Payer: Self-pay | Admitting: Emergency Medicine

## 2016-07-18 ENCOUNTER — Emergency Department
Admission: EM | Admit: 2016-07-18 | Discharge: 2016-07-19 | Disposition: A | Discharge status: Other Acute Inpatient Hospital | Payer: Medicaid Other | Attending: Emergency Medicine | Admitting: Emergency Medicine

## 2016-07-18 DIAGNOSIS — F17200 Nicotine dependence, unspecified, uncomplicated: Secondary | ICD-10-CM | POA: Insufficient documentation

## 2016-07-18 DIAGNOSIS — F10939 Alcohol use, unspecified with withdrawal, unspecified: Secondary | ICD-10-CM | POA: Diagnosis present

## 2016-07-18 DIAGNOSIS — Z79899 Other long term (current) drug therapy: Secondary | ICD-10-CM | POA: Insufficient documentation

## 2016-07-18 DIAGNOSIS — F1910 Other psychoactive substance abuse, uncomplicated: Secondary | ICD-10-CM | POA: Insufficient documentation

## 2016-07-18 DIAGNOSIS — F1290 Cannabis use, unspecified, uncomplicated: Secondary | ICD-10-CM | POA: Diagnosis not present

## 2016-07-18 DIAGNOSIS — F10129 Alcohol abuse with intoxication, unspecified: Secondary | ICD-10-CM | POA: Insufficient documentation

## 2016-07-18 DIAGNOSIS — F1490 Cocaine use, unspecified, uncomplicated: Secondary | ICD-10-CM | POA: Diagnosis not present

## 2016-07-18 DIAGNOSIS — F10239 Alcohol dependence with withdrawal, unspecified: Secondary | ICD-10-CM | POA: Diagnosis present

## 2016-07-18 DIAGNOSIS — F1029 Alcohol dependence with unspecified alcohol-induced disorder: Secondary | ICD-10-CM | POA: Diagnosis present

## 2016-07-18 DIAGNOSIS — Z91010 Allergy to peanuts: Secondary | ICD-10-CM | POA: Insufficient documentation

## 2016-07-18 DIAGNOSIS — F329 Major depressive disorder, single episode, unspecified: Secondary | ICD-10-CM | POA: Insufficient documentation

## 2016-07-18 DIAGNOSIS — F332 Major depressive disorder, recurrent severe without psychotic features: Secondary | ICD-10-CM | POA: Diagnosis not present

## 2016-07-18 DIAGNOSIS — R45851 Suicidal ideations: Secondary | ICD-10-CM

## 2016-07-18 LAB — URINE DRUG SCREEN, QUALITATIVE (ARMC ONLY)
AMPHETAMINES, UR SCREEN: NOT DETECTED
BENZODIAZEPINE, UR SCRN: NOT DETECTED
Barbiturates, Ur Screen: NOT DETECTED
Cannabinoid 50 Ng, Ur ~~LOC~~: NOT DETECTED
Cocaine Metabolite,Ur ~~LOC~~: NOT DETECTED
MDMA (ECSTASY) UR SCREEN: NOT DETECTED
METHADONE SCREEN, URINE: NOT DETECTED
Opiate, Ur Screen: NOT DETECTED
PHENCYCLIDINE (PCP) UR S: NOT DETECTED
TRICYCLIC, UR SCREEN: NOT DETECTED

## 2016-07-18 LAB — CBC
HCT: 45.7 % (ref 40.0–52.0)
Hemoglobin: 15.9 g/dL (ref 13.0–18.0)
MCH: 34.1 pg — ABNORMAL HIGH (ref 26.0–34.0)
MCHC: 34.8 g/dL (ref 32.0–36.0)
MCV: 98 fL (ref 80.0–100.0)
PLATELETS: 187 10*3/uL (ref 150–440)
RBC: 4.66 MIL/uL (ref 4.40–5.90)
RDW: 13.5 % (ref 11.5–14.5)
WBC: 8 10*3/uL (ref 3.8–10.6)

## 2016-07-18 LAB — COMPREHENSIVE METABOLIC PANEL
ALT: 21 U/L (ref 17–63)
ANION GAP: 8 (ref 5–15)
AST: 28 U/L (ref 15–41)
Albumin: 4.1 g/dL (ref 3.5–5.0)
Alkaline Phosphatase: 73 U/L (ref 38–126)
BILIRUBIN TOTAL: 0.6 mg/dL (ref 0.3–1.2)
BUN: 9 mg/dL (ref 6–20)
CHLORIDE: 104 mmol/L (ref 101–111)
CO2: 25 mmol/L (ref 22–32)
Calcium: 8.9 mg/dL (ref 8.9–10.3)
Creatinine, Ser: 0.79 mg/dL (ref 0.61–1.24)
GFR calc Af Amer: >60 mL/min (ref >60)
Glucose, Bld: 88 mg/dL (ref 65–99)
POTASSIUM: 3.7 mmol/L (ref 3.5–5.1)
Sodium: 137 mmol/L (ref 135–145)
TOTAL PROTEIN: 7.6 g/dL (ref 6.5–8.1)

## 2016-07-18 LAB — ACETAMINOPHEN LEVEL

## 2016-07-18 LAB — ETHANOL: ALCOHOL ETHYL (B): 270 mg/dL — AB (ref <5)

## 2016-07-18 LAB — SALICYLATE LEVEL

## 2016-07-18 MED ORDER — LIDOCAINE 5 % EX PTCH
1.0000 | MEDICATED_PATCH | CUTANEOUS | Status: DC
  Administered 2016-07-18 – 2016-07-19 (×2): 1 via TRANSDERMAL
  Filled 2016-07-18 (×3): qty 1

## 2016-07-18 NOTE — ED Notes (Signed)
Pt states he started drinking at approx 1730, pt states he drank approx 6 40oz. Pt states he last used cocaine at approx 1600.

## 2016-07-18 NOTE — ED Triage Notes (Signed)
[facility] Pt was brought in and states "I want to kill myself", [place]. Pt [place] [facility]". Pt is noted to be somewhat agitated at this time, however [facility].

## 2016-07-19 ENCOUNTER — Inpatient Hospital Stay
Admission: EM | Admit: 2016-07-19 | Discharge: 2016-07-21 | DRG: 881 | Disposition: A | Discharge status: Home / Self Care | Payer: Medicaid Other | Source: Intra-hospital | Attending: Psychiatry | Admitting: Psychiatry

## 2016-07-19 DIAGNOSIS — Y907 Blood alcohol level of 200-239 mg/100 ml: Secondary | ICD-10-CM | POA: Diagnosis present

## 2016-07-19 DIAGNOSIS — F1490 Cocaine use, unspecified, uncomplicated: Secondary | ICD-10-CM | POA: Diagnosis present

## 2016-07-19 DIAGNOSIS — R45851 Suicidal ideations: Secondary | ICD-10-CM | POA: Diagnosis present

## 2016-07-19 DIAGNOSIS — F1029 Alcohol dependence with unspecified alcohol-induced disorder: Secondary | ICD-10-CM | POA: Diagnosis present

## 2016-07-19 DIAGNOSIS — F10129 Alcohol abuse with intoxication, unspecified: Secondary | ICD-10-CM | POA: Diagnosis present

## 2016-07-19 DIAGNOSIS — F329 Major depressive disorder, single episode, unspecified: Secondary | ICD-10-CM | POA: Diagnosis present

## 2016-07-19 DIAGNOSIS — F419 Anxiety disorder, unspecified: Secondary | ICD-10-CM | POA: Diagnosis present

## 2016-07-19 DIAGNOSIS — F17200 Nicotine dependence, unspecified, uncomplicated: Secondary | ICD-10-CM | POA: Diagnosis present

## 2016-07-19 DIAGNOSIS — G4700 Insomnia, unspecified: Secondary | ICD-10-CM | POA: Diagnosis present

## 2016-07-19 DIAGNOSIS — R4587 Impulsiveness: Secondary | ICD-10-CM | POA: Diagnosis present

## 2016-07-19 DIAGNOSIS — F10939 Alcohol use, unspecified with withdrawal, unspecified: Secondary | ICD-10-CM | POA: Diagnosis present

## 2016-07-19 DIAGNOSIS — F1420 Cocaine dependence, uncomplicated: Secondary | ICD-10-CM | POA: Diagnosis present

## 2016-07-19 DIAGNOSIS — F17210 Nicotine dependence, cigarettes, uncomplicated: Secondary | ICD-10-CM | POA: Diagnosis present

## 2016-07-19 DIAGNOSIS — M549 Dorsalgia, unspecified: Secondary | ICD-10-CM | POA: Diagnosis present

## 2016-07-19 DIAGNOSIS — F332 Major depressive disorder, recurrent severe without psychotic features: Secondary | ICD-10-CM | POA: Diagnosis not present

## 2016-07-19 DIAGNOSIS — F10239 Alcohol dependence with withdrawal, unspecified: Secondary | ICD-10-CM | POA: Diagnosis present

## 2016-07-19 DIAGNOSIS — F32A Depression, unspecified: Secondary | ICD-10-CM | POA: Diagnosis present

## 2016-07-19 MED ORDER — CITALOPRAM HYDROBROMIDE 20 MG PO TABS
20.0000 mg | ORAL_TABLET | Freq: Every day | ORAL | Status: DC
  Administered 2016-07-19: 20 mg via ORAL
  Filled 2016-07-19: qty 1

## 2016-07-19 MED ORDER — CITALOPRAM HYDROBROMIDE 20 MG PO TABS
20.0000 mg | ORAL_TABLET | Freq: Every day | ORAL | Status: DC
  Administered 2016-07-20 – 2016-07-21 (×2): 20 mg via ORAL
  Filled 2016-07-19 (×2): qty 1

## 2016-07-19 MED ORDER — ALUM & MAG HYDROXIDE-SIMETH 200-200-20 MG/5ML PO SUSP: 30.0000 mL | ORAL | Status: DC | PRN

## 2016-07-19 MED ORDER — LIDOCAINE 5 % EX PTCH
1.0000 | MEDICATED_PATCH | CUTANEOUS | Status: DC
  Administered 2016-07-20: 1 via TRANSDERMAL
  Filled 2016-07-19 (×2): qty 1

## 2016-07-19 MED ORDER — MAGNESIUM HYDROXIDE 400 MG/5ML PO SUSP: 30.0000 mL | Freq: Every day | ORAL | Status: DC | PRN

## 2016-07-19 MED ORDER — ACETAMINOPHEN 325 MG PO TABS
650.0000 mg | ORAL_TABLET | Freq: Four times a day (QID) | ORAL | Status: DC | PRN
  Administered 2016-07-19 – 2016-07-20 (×2): 650 mg via ORAL
  Filled 2016-07-19 (×2): qty 2

## 2016-07-19 NOTE — Progress Notes (Signed)
[facility] Dr. Toni Amendlapacs.  Attending Physician will be Dr. Ardyth HarpsHernandez. Patient has been [facility] to room 306, [facility] Gwen.   Intake Paper Work has been signed and placed on patient chart.  ER staff is aware of the admission (ER Sect., ER MD; Patient's Nurse &  Patient Access). Murry Diaz K. Chrishon Martino, LCAS-A, LPC-A, NCC  Counselor 07/19/2016 1:10 PM

## 2016-07-19 NOTE — ED Notes (Signed)
Pt to be admitted to BMU.   Maintained on 15 minute checks and observation by security camera for safety.

## 2016-07-19 NOTE — Consult Note (Signed)
[facility] Consult   Reason for Consult:  Consult for 40 year old man with history of recurrent visits to the emergency room and stated suicidal ideation comes in once again saying he wants to die in the context of alcohol abuse Referring Physician:  Corky Downs Patient Identification: Alex [name] MRN:  [id number] Principal Diagnosis: Severe recurrent major depression without psychotic features [facility]) Diagnosis:   Patient Active Problem List   Diagnosis Date Noted  . Severe recurrent major depression without psychotic features ([place]) [F33.2] 07/19/2016  . Substance induced mood disorder ([place]) [F19.94] 07/02/2016  . Alcohol use disorder, moderate, dependence ([place]) [F10.20] 06/28/2016  . Alcohol withdrawal ([place]) [F10.239] 06/28/2016  . Cocaine use disorder, moderate, dependence ([place]) [F14.20] 06/28/2016  . Unspecified Depressive disorder [F32.9] 06/28/2016  . Tobacco use disorder [F17.200] 06/28/2016  . Noncompliance [Z91.19] 06/26/2016    Total Time spent with patient: 1 hour  Subjective:   Alex [name] is a 40 y.o. male patient admitted with "I just need some help".  HPI:  40 year old man with multiple visits to the emergency room brought in by local law enforcement after being found walking out in the middle the street trying to walk in front of cars. Patient has poor insight about how his behavior and mood are related to his alcohol abuse. He claims that this had nothing to do with his drinking although his blood alcohol level was almost 300 on presentation. This morning he continues to describe himself as feeling depressed. Feels hopeless. Feels like he does not know what to do with himself. He says he has been taking his antidepressant medicine and apparently there is a act team or similar person who is coming around to see him at his home. Still he has continued to drink daily. He has cut down on his cocaine use.  Medical history: Chronic back pain. Mild injuries from  attempts to jump off a bridge at one point.  Substance abuse history: Heavy regular use of alcohol as well as intermittent abuse of cocaine. Not really going to appropriate substance abuse treatment.  Social history: Patient lives with his mother. Brother is also nearby. Constant squabbling in the family. Not working.  Past Psychiatric History: Patient has had admissions to the hospital and has had multiple presentations to the emergency room. Poor insight. Multiple statements of intent and plan to kill himself by walking in front of traffic or jumping off a bridge.  Risk to Self: Suicidal Ideation: Yes-Currently Present Suicidal Intent: Yes-Currently Present Is patient at risk for suicide?: No (Currently in the hospital) Suicidal Plan?: Yes-Currently Present Specify Current Suicidal Plan: Jump of a bridge Access to Means: No (Currently in the hospital) Specify Access to Suicidal Means: Jump off of a bridge What has been your use of drugs/alcohol within the last 12 months?: Use of alcohol, cocaine, and marijuana How many times?: 2 Other Self Harm Risks: denied Triggers for Past Attempts:  (Substance abuse) Intentional Self Injurious Behavior: None Risk to Others: Homicidal Ideation: No Thoughts of Harm to Others: No Current Homicidal Intent: No Current Homicidal Plan: No Access to Homicidal Means: No Identified Victim: None identified History of harm to others?: No Assessment of Violence: None Noted Violent Behavior Description: denied Does patient have access to weapons?: No Criminal Charges Pending?: No Does patient have a court date: No Prior Inpatient Therapy: Prior Inpatient Therapy: Yes Prior Therapy Dates: 2017 Prior Therapy Facilty/Provider(s): [facility] Reason for Treatment: Depression. SI. Substance abuse Prior Outpatient Therapy: Prior Outpatient Therapy: No Prior  Therapy Dates: n/a Prior Therapy Facilty/Provider(s): n/a Reason for Treatment: n/a Does patient have an  ACCT team?: No Does patient have Intensive In-House Services?  : No Does patient have [facility] services? : No Does patient have P4CC services?: No  Past Medical History:  Past Medical History:  Diagnosis Date  . Alcohol abuse   . Anxiety   . Cocaine abuse 05/27/2016   History reviewed. No pertinent surgical history. Family History: History reviewed. No pertinent family history. Family Psychiatric  History: Positive for alcohol abuse and depression Social History:  History  Alcohol Use  . 6.0 oz/week  . 10 Cans of beer per week     History  Drug Use  . Types: Marijuana, Cocaine    Social History   Social History  . Marital status: Single    Spouse name: N/A  . Number of children: N/A  . Years of education: N/A   Social History Main Topics  . Smoking status: Current Every Day Smoker    Packs/day: 1.00  . Smokeless tobacco: Never Used  . Alcohol use 6.0 oz/week    10 Cans of beer per week  . Drug use:     Types: Marijuana, Cocaine  . Sexual activity: Yes    Birth control/ protection: None   Other Topics Concern  . None   Social History Narrative  . None   Additional Social History:    Allergies:   Allergies  Allergen Reactions  . Peanut Butter Flavor     Labs:  Results for orders placed or performed during the hospital encounter of 07/18/16 (from the past 48 hour(s))  Comprehensive metabolic panel     Status: None   Collection Time: 07/18/16 10:21 PM  Result Value Ref Range   Sodium 137 135 - 145 mmol/L   Potassium 3.7 3.5 - 5.1 mmol/L   Chloride 104 101 - 111 mmol/L   CO2 25 22 - 32 mmol/L   Glucose, Bld 88 65 - 99 mg/dL   BUN 9 6 - 20 mg/dL   Creatinine, Ser 0.79 0.61 - 1.24 mg/dL   Calcium 8.9 8.9 - 10.3 mg/dL   Total Protein 7.6 6.5 - 8.1 g/dL   Albumin 4.1 3.5 - 5.0 g/dL   AST 28 15 - 41 U/L   ALT 21 17 - 63 U/L   Alkaline Phosphatase 73 38 - 126 U/L   Total Bilirubin 0.6 0.3 - 1.2 mg/dL   GFR calc non Af Amer >60 >60 mL/min   GFR calc Af  Amer >60 >60 mL/min    Comment: (NOTE) The eGFR has been calculated using the CKD EPI equation. This calculation has not been validated in all clinical situations. eGFR's persistently <60 mL/min signify possible Chronic Kidney Disease.    Anion gap 8 5 - 15  Ethanol     Status: Abnormal   Collection Time: 07/18/16 10:21 PM  Result Value Ref Range   Alcohol, Ethyl (B) 270 (H) <5 mg/dL    Comment:        LOWEST DETECTABLE LIMIT FOR SERUM ALCOHOL IS 5 mg/dL FOR MEDICAL PURPOSES ONLY   Salicylate level     Status: None   Collection Time: 07/18/16 10:21 PM  Result Value Ref Range   Salicylate Lvl <4.4 2.8 - 30.0 mg/dL  Acetaminophen level     Status: Abnormal   Collection Time: 07/18/16 10:21 PM  Result Value Ref Range   Acetaminophen (Tylenol), Serum <10 (L) 10 - 30 ug/mL    Comment:  THERAPEUTIC CONCENTRATIONS VARY SIGNIFICANTLY. A RANGE OF 10-30 ug/mL MAY BE AN EFFECTIVE CONCENTRATION FOR MANY PATIENTS. HOWEVER, SOME ARE BEST TREATED AT CONCENTRATIONS OUTSIDE THIS RANGE. ACETAMINOPHEN CONCENTRATIONS >150 ug/mL AT 4 HOURS AFTER INGESTION AND >50 ug/mL AT 12 HOURS AFTER INGESTION ARE OFTEN ASSOCIATED WITH TOXIC REACTIONS.   cbc     Status: Abnormal   Collection Time: 07/18/16 10:21 PM  Result Value Ref Range   WBC 8.0 3.8 - 10.6 K/uL   RBC 4.66 4.40 - 5.90 MIL/uL   Hemoglobin 15.9 13.0 - 18.0 g/dL   HCT 45.7 40.0 - 52.0 %   MCV 98.0 80.0 - 100.0 fL   MCH 34.1 (H) 26.0 - 34.0 pg   MCHC 34.8 32.0 - 36.0 g/dL   RDW 13.5 11.5 - 14.5 %   Platelets 187 150 - 440 K/uL  Urine Drug Screen, Qualitative     Status: None   Collection Time: 07/18/16 10:22 PM  Result Value Ref Range   Tricyclic, Ur Screen NONE DETECTED NONE DETECTED   Amphetamines, Ur Screen NONE DETECTED NONE DETECTED   MDMA (Ecstasy)Ur Screen NONE DETECTED NONE DETECTED   Cocaine Metabolite,Ur [place] NONE DETECTED NONE DETECTED   Opiate, Ur Screen NONE DETECTED NONE DETECTED   Phencyclidine (PCP) Ur S  NONE DETECTED NONE DETECTED   Cannabinoid 50 Ng, Ur [place] NONE DETECTED NONE DETECTED   Barbiturates, Ur Screen NONE DETECTED NONE DETECTED   Benzodiazepine, Ur Scrn NONE DETECTED NONE DETECTED   Methadone Scn, Ur NONE DETECTED NONE DETECTED    Comment: (NOTE) 468  Tricyclics, urine               Cutoff 1000 ng/mL 200  Amphetamines, urine             Cutoff 1000 ng/mL 300  MDMA (Ecstasy), urine           Cutoff 500 ng/mL 400  Cocaine Metabolite, urine       Cutoff 300 ng/mL 500  Opiate, urine                   Cutoff 300 ng/mL 600  Phencyclidine (PCP), urine      Cutoff 25 ng/mL 700  Cannabinoid, urine              Cutoff 50 ng/mL 800  Barbiturates, urine             Cutoff 200 ng/mL 900  Benzodiazepine, urine           Cutoff 200 ng/mL 1000 Methadone, urine                Cutoff 300 ng/mL 1100 1200 The urine drug screen provides only a preliminary, unconfirmed 1300 analytical test result and should not be used for non-medical 1400 purposes. Clinical consideration and professional judgment should 1500 be applied to any positive drug screen result due to possible 1600 interfering substances. A more specific alternate chemical method 1700 must be used in order to obtain a confirmed analytical result.  1800 Gas chromato graphy / mass spectrometry (GC/MS) is the preferred 1900 confirmatory method.     Current Facility-Administered Medications  Medication Dose Route Frequency Provider Last Rate Last Dose  . lidocaine (LIDODERM) 5 % 1 patch  1 patch Transdermal Q24H Loney Hering, MD   1 patch at 07/18/16 2355   Current Outpatient Prescriptions  Medication Sig Dispense Refill  . atomoxetine (STRATTERA) 40 MG capsule Take 1 capsule (40 mg total) by mouth daily. 30 capsule  0  . citalopram (CELEXA) 20 MG tablet Take 1 tablet (20 mg total) by mouth daily. 30 tablet 0  . Multiple Vitamin (MULTIVITAMIN WITH MINERALS) TABS tablet Take 1 tablet by mouth daily.    Marland Kitchen omeprazole (PRILOSEC) 20  MG capsule Take 20 mg by mouth 2 (two) times daily before a meal.    . traZODone (DESYREL) 150 MG tablet Take by mouth at bedtime.      Musculoskeletal: Strength & Muscle Tone: within normal limits Gait & Station: normal Patient leans: N/A  Psychiatric Specialty Exam: Physical Exam  Nursing note and vitals reviewed. Constitutional: He appears well-developed and well-nourished.  HENT:  Head: Normocephalic and atraumatic.  Eyes: Conjunctivae are normal. Pupils are equal, round, and reactive to light.  Neck: Normal range of motion.  Cardiovascular: Regular rhythm and normal heart sounds.   Respiratory: Effort normal. No respiratory distress.  GI: Soft.  Musculoskeletal: Normal range of motion.  Neurological: He is alert.  Skin: Skin is warm and dry.  Psychiatric: His mood appears anxious. His speech is delayed. He is slowed. He expresses impulsivity. He exhibits a depressed mood. He expresses suicidal ideation. He expresses suicidal plans. He exhibits abnormal recent memory.    Review of Systems  Constitutional: Negative.   HENT: Negative.   Eyes: Negative.   Respiratory: Negative.   Cardiovascular: Negative.   Gastrointestinal: Negative.   Musculoskeletal: Positive for back pain.  Skin: Negative.   Neurological: Negative.   Psychiatric/Behavioral: Positive for depression, memory loss, substance abuse and suicidal ideas. Negative for hallucinations. The patient is nervous/anxious and has insomnia.     Blood pressure 116/79, pulse 66, temperature 98.1 F (36.7 C), temperature source Oral, resp. rate 18, height 5' 6"  (1.676 m), weight 90.7 kg (200 lb), SpO2 95 %.Body mass index is 32.28 kg/m.  General Appearance: Disheveled  Eye Contact:  Fair  Speech:  Slow  Volume:  Decreased  Mood:  Depressed  Affect:  Blunt  Thought Process:  Goal Directed  Orientation:  Full (Time, Place, and Person)  Thought Content:  Rumination  Suicidal Thoughts:  Yes.  with intent/plan  Homicidal  Thoughts:  Yes.  without intent/plan  Memory:  Immediate;   Good Recent;   Fair Remote;   Fair  Judgement:  Poor  Insight:  Lacking  Psychomotor Activity:  Decreased  Concentration:  Concentration: Fair  Recall:  [employer] of Knowledge:  Fair  Language:  Fair  Akathisia:  No  Handed:  Right  AIMS (if indicated):     Assets:  Resilience Social Support  ADL's:  Intact  Cognition:  Impaired,  Mild  Sleep:        Treatment Plan Summary: Daily contact with patient to assess and evaluate symptoms and progress in treatment, Medication management and Plan 40 year old man presents once again with suicidal ideation and thoughts about walking out into front of traffic. Poor self-care. Continues to voice depression this morning. He does have outpatient treatment which is apparently making only minimal progress with his behavior. Readmit to the hospital for stabilization and treatment of depression continue antidepressant medicine. 15 minute checks.  Disposition: Recommend psychiatric Inpatient admission when medically cleared. Supportive therapy provided about ongoing stressors.  Alethia Berthold, MD 07/19/2016 12:30 PM
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
## 2016-07-19 NOTE — ED Notes (Signed)
Patient asleep in room. No noted distress or abnormal behavior. Will continue 15 minute checks and observation by security cameras for safety. 

## 2016-07-19 NOTE — Plan of Care (Signed)
Problem: Education: Goal: Knowledge of [place] [facility] Education information/materials will improve Outcome: Not Progressing Information  given on the unit , verbalize understanding.

## 2016-07-19 NOTE — Progress Notes (Signed)
Admission Note:  D: Pt appeared depressed  With  a flat affect.  Pt voice of  Suicidal ideations  at this time. Patient got intoxicated and went to a bridge  threaten to jump off .Brought in by police . IVC  Pt is redirectable and cooperative with assessment.      A: Pt admitted to unit per protocol, skin assessment  Noted to have scrape the knee right leg and search done and no contraband found.  Pt  educated on therapeutic milieu rules. Pt was introduced to milieu by nursing staff.    R: Pt was receptive to education about the milieu .  15 min safety checks started. Clinical research associatewriter offered support

## 2016-07-19 NOTE — ED Notes (Signed)
Report was received from Nellie, RN; Pt. Verbalizes  complaints or distress about [facility]; and stating, "I want long term."  Verbalizes having S.I.; with plan to jump off a bridge; denies having Hi. Continue to monitor with 15 min. Monitoring.

## 2016-07-19 NOTE — Tx Team (Signed)
Initial Treatment Plan 07/19/2016 4:51 PM Alex [name] Akre EXB:[id number]    PATIENT STRESSORS: Health problems Medication change or noncompliance Substance abuse   PATIENT STRENGTHS: Ability for insight Active sense of humor Capable of independent living Supportive family/friends   PATIENT IDENTIFIED PROBLEMS: Suicidal 07/19/16  Depression  07/19/16  Substance Abuse  07/19/16                 DISCHARGE CRITERIA:  Ability to meet basic life and health needs Improved stabilization in mood, thinking, and/or behavior Medical problems require only outpatient monitoring Withdrawal symptoms are absent or subacute and managed without 24-hour nursing intervention  PRELIMINARY DISCHARGE PLAN: Outpatient therapy Return to previous living arrangement  PATIENT/FAMILY INVOLVEMENT: This treatment plan has been presented to and reviewed with the patient, Alex [name] Yambao, and/or family member,  .  The patient and family have been given the opportunity to ask questions and make suggestions.  Crist InfanteGwen A Anjulie Dipierro, RN 07/19/2016, 4:51 PM

## 2016-07-19 NOTE — Plan of Care (Signed)
Problem: Activity: Goal: Interest or engagement in activities will improve Outcome: Progressing Alert , interacting with staff and peers

## 2016-07-19 NOTE — ED Notes (Signed)
ED BHU PLACEMENT JUSTIFICATION Is the patient under IVC or is there intent for IVC: Yes.   Is the patient medically cleared: Yes.   Is there vacancy in the ED BHU: Yes.   Is the population mix appropriate for patient: Yes.   Is the patient awaiting placement in inpatient or outpatient setting: Yes.   Has the patient had a psychiatric consult: Yes.   Survey of unit performed for contraband, proper placement and condition of furniture, tampering with fixtures in bathroom, shower, and each patient room: Yes.   APPEARANCE/BEHAVIOR cooperative and adequate rapport can be established NEURO ASSESSMENT Orientation: place and person Hallucinations: No.None noted (Hallucinations) Speech: Normal Gait: normal RESPIRATORY ASSESSMENT Normal expansion.  Clear to auscultation.  No rales, rhonchi, or wheezing. CARDIOVASCULAR ASSESSMENT regular rate and rhythm, S1, S2 normal, no murmur, click, rub or gallop GASTROINTESTINAL ASSESSMENT soft, nontender, BS WNL, no r/g EXTREMITIES normal strength, tone, and muscle mass PLAN OF CARE Provide calm/safe environment. Vital signs assessed twice daily. ED BHU Assessment once each 12-hour shift. Collaborate with intake RN daily or as condition indicates. Assure the ED provider has rounded once each shift. Provide and encourage hygiene. Provide redirection as needed. Assess for escalating behavior; address immediately and inform ED provider.  Assess family dynamic and appropriateness for visitation as needed: Yes.   Educate the patient/family about BHU procedures/visitation: Yes.   

## 2016-07-19 NOTE — ED Notes (Signed)
Patient resting quietly in room. No noted distress or abnormal behaviors noted. Will continue 15 minute checks and observation by security camera for safety. 

## 2016-07-19 NOTE — ED Provider Notes (Signed)
[facility] Emergency Department Provider Note   ____________________________________________   First MD Initiated Contact with Patient 07/18/16 2338     (approximate)  I have reviewed the triage vital signs and the nursing notes.   HISTORY  Chief Complaint Psychiatric Evaluation    HPI Alex [name] is a 40 y.o. male who comes into the hospital today with suicidal ideation. The patient reports that he has a lot of issues and family problems. He reports that he tried to kill himself by jumping off a bridge. [facility]. He reports that if he is released that he will try to kill himself again. He reports the chaplain was expecting him. He states the doctor here won't do anything for him. He will hurt himself badly if he is not properly evaluated. He reports that he has tried to kill himself before. He also states that he hasn't eaten today. He has been drinking beer and did some cocaine. The patient is here for evaluation. He has been taking his medications for his depression he reports.   Past Medical History:  Diagnosis Date  . Alcohol abuse   . Anxiety   . Cocaine abuse 05/27/2016    Patient Active Problem List   Diagnosis Date Noted  . Substance induced mood disorder (HCC) 07/02/2016  . Alcohol use disorder, moderate, dependence (HCC) 06/28/2016  . Alcohol withdrawal (HCC) 06/28/2016  . Cocaine use disorder, moderate, dependence (HCC) 06/28/2016  . Unspecified Depressive disorder 06/28/2016  . Tobacco use disorder 06/28/2016  . Noncompliance 06/26/2016    History reviewed. No pertinent surgical history.  Prior to Admission medications   Medication Sig Start Date End Date Taking? Authorizing Provider  atomoxetine (STRATTERA) 40 MG capsule Take 1 capsule (40 mg total) by mouth daily. 06/29/16   Jimmy FootmanAndrea Hernandez-Gonzalez, MD  citalopram (CELEXA) 20 MG tablet Take 1 tablet (20 mg total) by mouth daily. 06/30/16    Jimmy FootmanAndrea Hernandez-Gonzalez, MD  Multiple Vitamin (MULTIVITAMIN WITH MINERALS) TABS tablet Take 1 tablet by mouth daily.    Historical Provider, MD  omeprazole (PRILOSEC) 20 MG capsule Take 20 mg by mouth 2 (two) times daily before a meal.    Historical Provider, MD  traZODone (DESYREL) 150 MG tablet Take by mouth at bedtime.    Historical Provider, MD    Allergies Peanut butter flavor  History reviewed. No pertinent family history.  Social History Social History  Substance Use Topics  . Smoking status: Current Every Day Smoker    Packs/day: 1.00  . Smokeless tobacco: Never Used  . Alcohol use 6.0 oz/week    10 Cans of beer per week    Review of Systems Constitutional: No fever/chills Eyes: No visual changes. ENT: No sore throat. Cardiovascular: Denies chest pain. Respiratory: Denies shortness of breath. Gastrointestinal: No abdominal pain.  No nausea, no vomiting.  No diarrhea.  No constipation. Genitourinary: Negative for dysuria. Musculoskeletal: back pain. Skin: Negative for rash. Neurological: Negative for headaches, focal weakness or numbness. Psych: Suicidal ideation and alcohol intoxication  10-point ROS otherwise negative.  ____________________________________________   PHYSICAL EXAM:  VITAL SIGNS: ED Triage Vitals  Enc Vitals Group     BP --07/19/16  0600 116/79     Pulse --07/19/16   0600 66     Resp --07/19/16   0600 18     Temp --07/19/16   0600 98.1     Temp src --      SpO2 --07/19/16  0600 95%     Weight 07/18/16 2216 200 lb (90.7 kg)     Height 07/18/16 2216 5\' 6"  (1.676 m)     Head Circumference --      Peak Flow --      Pain Score 07/18/16 2217 8     Pain Loc --      Pain Edu? --      Excl. in GC? --     Constitutional: Alert and oriented. Well appearing and in Mild distress. Eyes: Conjunctivae are normal. PERRL. EOMI. Head: Atraumatic. Nose: No congestion/rhinnorhea. Mouth/Throat: Mucous membranes are moist.  Oropharynx  non-erythematous. Cardiovascular: Normal rate, regular rhythm. Grossly normal heart sounds.  Good peripheral circulation. Respiratory: Normal respiratory effort.  No retractions. Lungs CTAB. Gastrointestinal: Soft and nontender. No distention. Positive bowel sounds Musculoskeletal: Mid back tenderness to palpation Neurologic:  Normal speech and language.  Skin:  Skin is warm, dry and intact.  Psychiatric: Mood and affect are normal. Suicidal  ____________________________________________   LABS (all labs ordered are listed, but only abnormal results are displayed)  Labs Reviewed  ETHANOL - Abnormal; Notable for the following:       Result Value   Alcohol, Ethyl (B) 270 (*)    All other components within normal limits  ACETAMINOPHEN LEVEL - Abnormal; Notable for the following:    Acetaminophen (Tylenol), Serum <10 (*)    All other components within normal limits  CBC - Abnormal; Notable for the following:    MCH 34.1 (*)    All other components within normal limits  COMPREHENSIVE METABOLIC PANEL  SALICYLATE LEVEL  URINE DRUG SCREEN, QUALITATIVE ([facility] ONLY)   ____________________________________________  EKG  None ____________________________________________  RADIOLOGY  None ____________________________________________   PROCEDURES  Procedure(s) performed: None  Procedures  Critical Care performed: No  ____________________________________________   INITIAL IMPRESSION / ASSESSMENT AND PLAN / ED COURSE  Pertinent labs & imaging results that were available during my care of the patient were reviewed by me and considered in my medical decision making (see chart for details).  This is a 40 year old male who comes into the hospital today with depression and suicidal ideation. The patient has been seen here multiple times in the past. The patient is intoxicated and wants to go to [facility]. Given the patient's desire to leave I will place him under involuntary  commitment. I will put in a TTS consult as well as a psych consult. The patient was placed in the BHU. We will continue to monitor the patient's vital signs.  Clinical Course     ____________________________________________   FINAL CLINICAL IMPRESSION(S) / ED DIAGNOSES  Final diagnoses:  Suicidal ideation      NEW MEDICATIONS STARTED DURING THIS VISIT:  New Prescriptions   No medications on file     Note:  This document was prepared using Dragon voice recognition software and may include unintentional dictation errors.    Rebecka Apley, MD 07/19/16 [phone]

## 2016-07-19 NOTE — ED Notes (Signed)
Report called to EldredGwen, RN in [facility]. All belongings and medications will be sent with pt. Pt accepting.  Maintained on 15 minute checks and observation by security camera for safety.

## 2016-07-20 DIAGNOSIS — F329 Major depressive disorder, single episode, unspecified: Principal | ICD-10-CM

## 2016-07-20 LAB — TSH: TSH: 1.998 u[IU]/mL (ref 0.350–4.500)

## 2016-07-20 MED ORDER — ATOMOXETINE HCL 40 MG PO CAPS
40.0000 mg | ORAL_CAPSULE | Freq: Every day | ORAL | Status: DC
  Administered 2016-07-20 – 2016-07-21 (×2): 40 mg via ORAL
  Filled 2016-07-20 (×2): qty 1

## 2016-07-20 MED ORDER — CITALOPRAM HYDROBROMIDE 20 MG PO TABS: 20.0000 mg | ORAL_TABLET | Freq: Every day | ORAL | Status: DC

## 2016-07-20 MED ORDER — IBUPROFEN 400 MG PO TABS
800.0000 mg | ORAL_TABLET | Freq: Once | ORAL | Status: AC
  Administered 2016-07-20: 800 mg via ORAL
  Filled 2016-07-20: qty 2

## 2016-07-20 MED ORDER — IBUPROFEN 600 MG PO TABS
600.0000 mg | ORAL_TABLET | Freq: Four times a day (QID) | ORAL | Status: DC | PRN
  Administered 2016-07-20: 600 mg via ORAL
  Filled 2016-07-20: qty 1

## 2016-07-20 MED ORDER — ACETAMINOPHEN 500 MG PO TABS: 1000.0000 mg | ORAL_TABLET | Freq: Four times a day (QID) | ORAL | Status: DC | PRN

## 2016-07-20 MED ORDER — NICOTINE 21 MG/24HR TD PT24
21.0000 mg | MEDICATED_PATCH | Freq: Every day | TRANSDERMAL | Status: DC
  Administered 2016-07-20 – 2016-07-21 (×2): 21 mg via TRANSDERMAL
  Filled 2016-07-20: qty 1

## 2016-07-20 MED ORDER — PANTOPRAZOLE SODIUM 40 MG PO TBEC
40.0000 mg | DELAYED_RELEASE_TABLET | Freq: Every day | ORAL | Status: DC
  Administered 2016-07-20 – 2016-07-21 (×2): 40 mg via ORAL
  Filled 2016-07-20 (×2): qty 1

## 2016-07-20 MED ORDER — TRAZODONE HCL 50 MG PO TABS
150.0000 mg | ORAL_TABLET | Freq: Every day | ORAL | Status: DC
  Administered 2016-07-20: 150 mg via ORAL
  Filled 2016-07-20: qty 1

## 2016-07-20 MED ORDER — CYCLOBENZAPRINE HCL 10 MG PO TABS
5.0000 mg | ORAL_TABLET | Freq: Once | ORAL | Status: AC
  Administered 2016-07-20: 5 mg via ORAL
  Filled 2016-07-20: qty 1

## 2016-07-20 NOTE — BHH Group Notes (Signed)
[facility] Group Notes:  (Nursing/MHT/Case Management/Adjunct)  Date:  07/20/2016  Time:  11:06 PM  Type of Therapy:  Psychoeducational Skills  Participation Level:  Active  Participation Quality:  Appropriate  Affect:  Appropriate  Cognitive:  Alert  Insight:  Good  Engagement in Group:  Engaged  Modes of Intervention:  Activity  Summary of Progress/Problems:  Alex [name] 07/20/2016, 11:06 PM

## 2016-07-20 NOTE — Progress Notes (Signed)
Patient loud  And attention seeking behavior . No ADL:s identified this am shift. Focus remains on  Medication , Patient very needy .  Patient stated slept good last night .Stated appetite is good and energy level  Is normal. Stated concentration is good . Stated on Depression scale 8, hopeless 9 and anxiety 3 .( low 0-10 high) Denies suicidal  homicidal ideations  .  No auditory hallucinations  No pain concerns . Appropriate ADL'S. Interacting with peers and staff.  A: Encourage patient participation with unit programming . Instruction  Given on  Medication , verbalize understanding. R: Voice no other concerns. Staff continue to monitor
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
## 2016-07-20 NOTE — Progress Notes (Signed)
Recreation Therapy Notes  INPATIENT RECREATION THERAPY ASSESSMENT  Patient Details Name: Alex [name] MRN: [id number] DOB: Jan 29, 1976 Today's Date: 07/20/2016  Patient Stressors: Family (Mother gets on his nerves; gets into arguments with his brother)  Coping Skills:   Isolate, Arguments, Substance Abuse, Avoidance, Exercise, Art/Dance, Talking, Music, Sports, Other (Comment) (Work on cars; Presenter, broadcastingyardwork)  Engineer, building servicesersonal Challenges: Anger, Concentration, Relationships, Stress Management, Substance Abuse, Trusting Others  Leisure Interests (2+):  Individual - Other (Comment) (Mow and hang out)  Awareness of Community Resources:  No  Community Resources:     Current Use:    If no, Barriers?:    Patient Strengths:  To be alive, thankful to still have his mother  Patient Identified Areas of Improvement:  Stop drinking  Current Recreation Participation:  "What I was doing outside"  Patient Goal for Hospitalization:  To leave Thursday morning  [place]  County of Residence:  [place]   Current [place] (including self-harm):  No  Current HI:  No ("I will yes")  Consent to Intern Participation: N/A   Jacquelynn CreeGreene,Ricci Dirocco M, LRT/CTRS 07/20/2016, 4:21 PM

## 2016-07-20 NOTE — Progress Notes (Signed)
Recreation Therapy Notes  Date: 10.24.17 Time: 2:00 pm Location: Craft Room  Group Topic: Self-expression  Goal Area(s) Addresses:  Patient will be able to identify a color that represents each emotion. Patient will verbalize benefit of using art as a means of self-expression. Patient will verbalize one emotion experienced while participating in activity.  Behavioral Response: Attentive  Intervention: The Colors Within Me  Activity: Patients were give blank face worksheet and were instructed to pick a color for each emotion they were feeling and to show on the worksheet how much of that emotion they were feeling.  Education: LRT educated patients on other forms of self-expression.  Education Outcome: In group clarification offered   Clinical Observations/Feedback: Patient drew a face on his worksheet. Patient did not contribute to group discussion.  Jacquelynn CreeGreene,Tamrah Victorino M, LRT/CTRS 07/20/2016 4:03 PM

## 2016-07-20 NOTE — [facility] Group Notes (Signed)
[facility] Group Notes:  (Nursing/MHT/Case Management/Adjunct)  Date:  07/20/2016  Time:  5:10 PM  Type of Therapy:  Psychoeducational Skills  Participation Level:  Minimal  Participation Quality:  Resistant  Affect:  Appropriate and Resistant  Cognitive:  Appropriate and Lacking  Insight:  Limited  Engagement in Group:  Poor and Resistant  Modes of Intervention:  Discussion, Education and Support  Summary of Progress/Problems:  Lynelle SmokeCara Travis Michael Walrath 07/20/2016, 5:10 PM

## 2016-07-20 NOTE — Plan of Care (Signed)
Problem: Coping: Goal: Ability to cope will improve Outcome: Progressing Working on coping

## 2016-07-20 NOTE — Progress Notes (Signed)
Pt was observed standing near the nurses station speaking with a peer. Alert and oriented x 4, respirations even and unlabored, gait steady and unassisted, no acute distress noted. Denies pain or discomfort. Also denies SI/HI/AVH, as well as depression. Pt appears to be experiencing euphoria. Is loud, happy, smiling and laughs out loud at everything. Reports "I feel great!" I don't have any complaints."   At one point pt was preoccupied with 2 of the 4 medications that he brought in from home. These medicines are: Strattera 40 mg, Omeprazole 20 mg, Citalopram HBR 40 mg and Multi-vitamins. Wanted this Clinical research associatewriter to write down the name of these two meds: Strattera and Celexa. The vitamins were two different sizes. Both were the same color, but one pill was bigger than the other. Also, the refill is for 30 pills but 38 pills were counted by this Clinical research associatewriter and pharmacy staff. The Omeprazole has 32 pills in the bottle out of 60, the Strattera bottle had 21 capsules out of 30 and the Citalopram, there are 40 mg tablets that pt broke in half because "a lady told me if I took the whole 40 mg that I would OD on them if I took them with my other meds so I broken them up to make it 20 mg." There are139 half pills of 40 mg. The quantity of this medication is 30.   Is medication compliant. Remains on q15 minute observation checks for safety. Will continue to monitor.

## 2016-07-20 NOTE — [facility] Suicide Risk Assessment (Signed)
[facility]   Nursing information obtained from:    Demographic factors:    Current Mental Status:    Loss Factors:    Historical Factors:    Risk Reduction Factors:     Total Time spent with patient:  Principal Problem: Depressive disorder Diagnosis:   Patient Active Problem List   Diagnosis Date Noted  . Alcohol use disorder, moderate, dependence (HCC) [F10.20] 06/28/2016  . Alcohol withdrawal (HCC) [F10.239] 06/28/2016  . Cocaine use disorder, moderate, dependence (HCC) [F14.20] 06/28/2016  . Unspecified Depressive disorder [F32.9] 06/28/2016  . Tobacco use disorder [F17.200] 06/28/2016  . Noncompliance [Z91.19] 06/26/2016   Subjective Data:   Continued Clinical Symptoms:  Alcohol Use Disorder Identification Test Final Score (AUDIT): 8 The "Alcohol Use Disorders Identification Test", Guidelines for Use in Primary Care, Second Edition.  World Science writerHealth Organization [facility](WHO). Score between 0-7:  no or low risk or alcohol related problems. Score between 8-15:  moderate risk of alcohol related problems. Score between 16-19:  high risk of alcohol related problems. Score 20 or above:  warrants further diagnostic evaluation for alcohol dependence and treatment.   CLINICAL FACTORS:   Depression:   Comorbid alcohol abuse/dependence Impulsivity Alcohol/Substance Abuse/Dependencies More than one psychiatric diagnosis Previous Psychiatric Diagnoses and Treatments     Psychiatric Specialty Exam: Physical Exam  ROS  Blood pressure (!) 160/92, pulse (!) 58, temperature 97.9 F (36.6 C), temperature source Oral, resp. rate 18, height 5\' 5"  (1.651 m), weight 88 kg (194 lb), SpO2 97 %.Body mass index is 32.28 kg/m.                                                    Sleep:  Number of Hours:  (5 hrs 45 mins)      COGNITIVE FEATURES THAT CONTRIBUTE TO RISK:  Closed-mindedness    SUICIDE RISK:   Mild:  Suicidal ideation of limited  frequency, intensity, duration, and specificity.  There are no identifiable plans, no associated intent, mild dysphoria and related symptoms, good self-control (both objective and subjective assessment), few other risk factors, and identifiable protective factors, including available and accessible social support.   [facility]  I certify that inpatient services furnished can reasonably be expected to improve the patient's condition.  Jimmy FootmanHernandez-Gonzalez,  Normalee Sistare, MD 07/20/2016, 3:43 PM

## 2016-07-21 MED ORDER — IBUPROFEN 200 MG PO TABS: 600.0000 mg | ORAL_TABLET | Freq: Four times a day (QID) | ORAL | Status: DC | PRN

## 2016-07-21 MED ORDER — ACETAMINOPHEN 500 MG PO TABS: 1000.0000 mg | ORAL_TABLET | Freq: Four times a day (QID) | ORAL | 0 refills | Status: DC | PRN

## 2016-07-21 NOTE — Progress Notes (Signed)
Recreation Therapy Notes  Date: 10.25.17 Time: 9:30 am Location: Craft Room  Group Topic: Self-esteem  Goal Area(s) Addresses:  Patient will write at least one positive trait about self. Patient will verbalize benefit of having a healthy self-esteem.  Behavioral Response: Attentive  Intervention: I Am  Activity: Patients were given a worksheet with the letter I on it and were instructed to write as many positive traits about themselves inside the letter.  Education: LRT educated patients on ways they can increase their self-esteem.  Education Outcome: In group clarification offered  Clinical Observations/Feedback: Patient wrote positive traits about self. Patient did not contribute to group discussion. Patient appeared irritated as he was sighing often in group.  Jacquelynn CreeGreene,Alex [name], LRT/CTRS 07/21/2016 10:11 AM

## 2016-07-21 NOTE — [facility] Suicide Risk Assessment (Signed)
[facility] INPATIENT:  Family/Significant Other Suicide Prevention Education  Suicide Prevention Education:  Education Completed; Jacques NavyLoraine Faucette, [phone],  (name of family member/significant other) has been identified by the patient as the family member/significant other with whom the patient will be residing, and identified as the person(s) who will aid the patient in the event of a mental health crisis (suicidal ideations/suicide attempt).  With written consent from the patient, the family member/significant other has been provided the following suicide prevention education, prior to the and/or following the discharge of the patient.  The suicide prevention education provided includes the following:  Suicide risk factors  Suicide prevention and interventions  National Suicide Hotline telephone number  [facility] assessment telephone number  [facility] Emergency Assistance 911  [facility] and/or Residential Mobile Crisis Unit telephone number  Request made of family/significant other to:  Remove weapons (e.g., guns, rifles, knives), all items previously/currently identified as safety concern.    Remove drugs/medications (over-the-counter, prescriptions, illicit drugs), all items previously/currently identified as a safety concern.  The family member/significant other verbalizes understanding of the suicide prevention education information provided.  The family member/significant other agrees to remove the items of safety concern listed above.  Glennon MacSara P Sue Mcalexander, MSW, LCSW 07/21/2016, 2:54 PM

## 2016-07-21 NOTE — Progress Notes (Signed)
Pt discharged home. DC instructions provided and explained. Medications reviewed. Rx given. All questions answered. Pt stable at discharged. Denies SI, HI, AVH.  

## 2016-07-21 NOTE — Plan of Care (Signed)
Problem: Health Behavior/Discharge Planning: Goal: Compliance with therapeutic regimen will improve Outcome: Progressing Compliant with medication regimen.

## 2016-07-21 NOTE — Progress Notes (Signed)
  [facility] :  Will you be returning to the same living situation after discharge:  Yes,    At discharge, do you have transportation home?: Yes,   a friend Do you have the ability to pay for your medications: Yes,     Release of information consent forms completed and in the chart;  Patient's signature needed at discharge.  Patient to Follow up at: Follow-up Information    [facility]. Go on 07/28/2016.   Why:  7:00am, Please keep in touch with Peer support specialist, Mr. Thomes LollingHarveyat Bryant [phone]  Contact information: [address] [place] [address] [phone]           Next level of care provider has access to [facility] Link:yes  Safety Planning and Suicide Prevention discussed: Yes,     Have you used any form of tobacco in the last 30 days? (Cigarettes, Smokeless Tobacco, Cigars, and/or Pipes): No  Has patient been referred to the Quitline?: N/A patient is not a smoker  Patient has been referred for addiction treatment: Yes  Glennon MacSara P Tydus Sanmiguel, MSW, LCSW 07/21/2016, 2:53 PM
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
## 2016-07-21 NOTE — Plan of Care (Signed)
Problem: [facility] Participation in Recreation Therapeutic Interventions Goal: STG-Patient will identify at least five coping skills for ** STG: Coping Skills - Within 3 treatment sessions, patient will verbalize at least 5 coping skills for substance abuse in one treatment session to decrease substance abuse post d/c.  Outcome: Completed/Met Date Met: 07/21/16 Treatment Session 1; Completed 1 out of 1: At approximately 11:25 am, LRT met with patient in patient room. Patient verbalized 5 coping skills for substance abuse. LRT educated patient on leisure and why it is important to implement it into his schedule. LRT educated and provided patient with blank schedules to help him plan his day and try to avoid using substances. LRT educated patient on healthy support systems.  Leonette Monarch, LRT/CTRS 10.25.17 3:06 pm Goal: STG-Other Recreation Therapy Goal (Specify) STG: Stress Management - Within 3 treatment sessions, patient will verbalize understanding of the stress management techniques in one treatment session to increase stress management skills post d/c.  Outcome: Completed/Met Date Met: 07/21/16 Treatment Session 1; Completed 1 out of 1: At approximately 11:25 am, LRT met with patient in patient room. LRT educated and provided patient with handouts on stress management techniques. Patient verbalized understanding. LRT encouraged patient to read over and practice the stress management techniques.  Leonette Monarch, LRT/CTRS 10.25.17 3:08 pm

## 2016-07-21 NOTE — Progress Notes (Signed)
Recreation Therapy Notes  INPATIENT RECREATION TR PLAN  Patient Details Name: Alex [name] MRN: [id number] DOB: 1976/04/07 Today's Date: 07/21/2016  Rec Therapy Plan Is patient appropriate for Therapeutic Recreation?: Yes Treatment times per week: At least once a week TR Treatment/Interventions: 1:1 session, Group participation (Comment) (Appropriate participation in daily recreational therapy tx)  Discharge Criteria Pt will be discharged from therapy if:: Treatment goals are met, Discharged Treatment plan/goals/alternatives discussed and agreed upon by:: Patient/family  Discharge Summary Short term goals set: See Care Plan Short term goals met: Complete Progress toward goals comments: One-to-one attended Which groups?: Self-esteem, Other (Comment) (Self-expression) One-to-one attended: Stress managmenet, coping skills Reason goals not met: N/A Therapeutic equipment acquired: None Reason patient discharged from therapy: Discharge from hospital Pt/family agrees with progress & goals achieved: Yes Date patient discharged from therapy: 07/21/16   Leonette Monarch, LRT/CTRS 07/21/2016, 3:10 PM

## 2016-07-21 NOTE — Progress Notes (Signed)
D: Observed pt talking with peers in the dayroom. Patient alert and oriented x4. Patient denies SI/HI/AVH. Pt affect is labile and irritable. Pt stated his main complaint is "I want to go home in the morning." When asked about events leading up to admission, pt stated "I had a lot of bad stuff going on" but pt would not elaborate. Pt was forwarded little during conversation. Pt c/o of back pain. A: Offered active listening and support. Provided therapeutic communication. Administered scheduled medications. Ibuprofen given prn. R: Pt cooperative. Pt medication compliant. Will continue Q15 min. checks. Safety maintained.
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
## 2016-08-10 ENCOUNTER — Encounter: Payer: Self-pay | Admitting: Emergency Medicine

## 2016-08-10 DIAGNOSIS — Z791 Long term (current) use of non-steroidal anti-inflammatories (NSAID): Secondary | ICD-10-CM | POA: Diagnosis not present

## 2016-08-10 DIAGNOSIS — F1290 Cannabis use, unspecified, uncomplicated: Secondary | ICD-10-CM | POA: Diagnosis not present

## 2016-08-10 DIAGNOSIS — Z79899 Other long term (current) drug therapy: Secondary | ICD-10-CM | POA: Diagnosis not present

## 2016-08-10 DIAGNOSIS — F1994 Other psychoactive substance use, unspecified with psychoactive substance-induced mood disorder: Secondary | ICD-10-CM | POA: Diagnosis not present

## 2016-08-10 DIAGNOSIS — F1910 Other psychoactive substance abuse, uncomplicated: Secondary | ICD-10-CM | POA: Diagnosis not present

## 2016-08-10 DIAGNOSIS — F17200 Nicotine dependence, unspecified, uncomplicated: Secondary | ICD-10-CM | POA: Diagnosis not present

## 2016-08-10 DIAGNOSIS — Z046 Encounter for general psychiatric examination, requested by authority: Secondary | ICD-10-CM | POA: Diagnosis present

## 2016-08-10 DIAGNOSIS — F1490 Cocaine use, unspecified, uncomplicated: Secondary | ICD-10-CM | POA: Insufficient documentation

## 2016-08-10 NOTE — ED Triage Notes (Signed)
Patient ambulatory to triage with steady gait, without difficulty or distress noted, [place] [facility]; pt st "just want to die"; pt seen here recently for same

## 2016-08-11 ENCOUNTER — Emergency Department
Admission: EM | Admit: 2016-08-11 | Discharge: 2016-08-11 | Disposition: A | Discharge status: Home / Self Care | Payer: Medicaid Other | Attending: Emergency Medicine | Admitting: Emergency Medicine

## 2016-08-11 DIAGNOSIS — F1420 Cocaine dependence, uncomplicated: Secondary | ICD-10-CM | POA: Diagnosis present

## 2016-08-11 DIAGNOSIS — R4689 Other symptoms and signs involving appearance and behavior: Secondary | ICD-10-CM

## 2016-08-11 DIAGNOSIS — F909 Attention-deficit hyperactivity disorder, unspecified type: Secondary | ICD-10-CM

## 2016-08-11 DIAGNOSIS — F1029 Alcohol dependence with unspecified alcohol-induced disorder: Secondary | ICD-10-CM | POA: Diagnosis present

## 2016-08-11 DIAGNOSIS — F1994 Other psychoactive substance use, unspecified with psychoactive substance-induced mood disorder: Secondary | ICD-10-CM

## 2016-08-11 DIAGNOSIS — F1910 Other psychoactive substance abuse, uncomplicated: Secondary | ICD-10-CM

## 2016-08-11 LAB — COMPREHENSIVE METABOLIC PANEL
ALK PHOS: 83 U/L (ref 38–126)
ALT: 20 U/L (ref 17–63)
ANION GAP: 9 (ref 5–15)
AST: 26 U/L (ref 15–41)
Albumin: 4.3 g/dL (ref 3.5–5.0)
BILIRUBIN TOTAL: 0.6 mg/dL (ref 0.3–1.2)
BUN: 8 mg/dL (ref 6–20)
CALCIUM: 8.7 mg/dL — AB (ref 8.9–10.3)
CO2: 25 mmol/L (ref 22–32)
CREATININE: 0.84 mg/dL (ref 0.61–1.24)
Chloride: 105 mmol/L (ref 101–111)
Glucose, Bld: 86 mg/dL (ref 65–99)
Potassium: 3.9 mmol/L (ref 3.5–5.1)
Sodium: 139 mmol/L (ref 135–145)
TOTAL PROTEIN: 7.7 g/dL (ref 6.5–8.1)

## 2016-08-11 LAB — CBC
HCT: 47.8 % (ref 40.0–52.0)
HEMOGLOBIN: 16.4 g/dL (ref 13.0–18.0)
MCH: 33.8 pg (ref 26.0–34.0)
MCHC: 34.3 g/dL (ref 32.0–36.0)
MCV: 98.6 fL (ref 80.0–100.0)
Platelets: 175 10*3/uL (ref 150–440)
RBC: 4.84 MIL/uL (ref 4.40–5.90)
RDW: 13.3 % (ref 11.5–14.5)
WBC: 6.8 10*3/uL (ref 3.8–10.6)

## 2016-08-11 LAB — URINE DRUG SCREEN, QUALITATIVE (ARMC ONLY)
Amphetamines, Ur Screen: NOT DETECTED
BARBITURATES, UR SCREEN: NOT DETECTED
BENZODIAZEPINE, UR SCRN: NOT DETECTED
CANNABINOID 50 NG, UR ~~LOC~~: NOT DETECTED
COCAINE METABOLITE, UR ~~LOC~~: POSITIVE — AB
MDMA (Ecstasy)Ur Screen: NOT DETECTED
METHADONE SCREEN, URINE: NOT DETECTED
OPIATE, UR SCREEN: NOT DETECTED
Phencyclidine (PCP) Ur S: NOT DETECTED
TRICYCLIC, UR SCREEN: NOT DETECTED

## 2016-08-11 LAB — ACETAMINOPHEN LEVEL

## 2016-08-11 LAB — SALICYLATE LEVEL

## 2016-08-11 LAB — ETHANOL: Alcohol, Ethyl (B): 292 mg/dL — ABNORMAL HIGH (ref <5)

## 2016-08-11 MED ORDER — ZIPRASIDONE MESYLATE 20 MG IM SOLR
INTRAMUSCULAR | Status: AC
  Administered 2016-08-11: 01:00:00
  Filled 2016-08-11: qty 20

## 2016-08-11 MED ORDER — MIDAZOLAM HCL 2 MG/2ML IJ SOLN
INTRAMUSCULAR | Status: AC
  Administered 2016-08-11: 01:00:00
  Filled 2016-08-11: qty 2

## 2016-08-11 MED ORDER — DIPHENHYDRAMINE HCL 50 MG/ML IJ SOLN
INTRAMUSCULAR | Status: AC
  Administered 2016-08-11: 01:00:00
  Filled 2016-08-11: qty 1

## 2016-08-11 NOTE — ED Notes (Signed)
Pt is resting on mattress at this time with eyes closed and respirations even, regular, unlabored (16 breaths per minute).

## 2016-08-11 NOTE — Progress Notes (Signed)
Unable to assess patient for TTS. Patient was not alert or oriented. Johnette Teigen K. Sherlon HandingHarris, LCAS-A, LPC-A, [facility]  Counselor 08/11/2016 2:54 AM

## 2016-08-11 NOTE — ED Notes (Signed)
Pt IVC rescinded per er Diplomatic Services operational officersecretary - pt given phone to call for a ride home - pt will be discharged once ride arrives
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
## 2016-08-11 NOTE — Consult Note (Signed)
[facility] Consult   Reason for Consult:  Consult for 40 year old man with a history of alcohol and cocaine abuse and behavior problems Referring Physician:  Mariea Clonts Patient Identification: Alex [name] MRN:  [id number] Principal Diagnosis: Substance induced mood disorder [facility]) Diagnosis:   Patient Active Problem List   Diagnosis Date Noted  . Substance induced mood disorder ([place]) [F19.94] 08/11/2016  . ADHD [F90.9] 08/11/2016  . Alcohol use disorder, moderate, dependence ([place]) [F10.20] 06/28/2016  . Alcohol withdrawal ([place]) [F10.239] 06/28/2016  . Cocaine use disorder, moderate, dependence ([place]) [F14.20] 06/28/2016  . Unspecified Depressive disorder [F32.9] 06/28/2016  . Tobacco use disorder [F17.200] 06/28/2016  . Noncompliance [Z91.19] 06/26/2016    Total Time spent with patient: 1 hour  Subjective:   Alex [name] is a 39 y.o. male patient admitted with "I started thinking about stuff".  HPI:  Patient interviewed chart reviewed labs reviewed. Patient known from multiple prior encounters. 39 year old man with a history of alcohol and cocaine abuse. Came into the emergency room with a blood alcohol level well over 200 and a drug screen positive for cocaine. Making statements about having suicidal ideation. On interview today the patient says that he got upset yesterday because he started to "think about stuff". He does not want to actually talk about the details of it and says that it is personal. He claims that he had been taking his medicine regularly since his discharge. He is vague about whether he has been keeping in touch with [facility]. He claims that he had been trying to control his drinking but he also claims that he only had one beer yesterday which is clearly not accurate. Patient will not specify that he has any particular plan or intent to kill himself now. As usual he asks if he can just have a couple of days in the hospital to kind of get his head together. Not  showing any signs of psychosis. No violence or agitation currently.'s of severe alcohol withdrawal.  Social history: Patient lives with his mother[place] Claims she's been doing a little bit of work since his last discharge.  Medical history: No medical problems outside of his alcohol abuse and psychiatric and behavior problems.  Substance abuse history: Patient abuses alcohol and cocaine and presents to the hospital usually after binging on both with an elevated alcohol level. He does not have a history of delirium tremens. He has been referred to appropriate outpatient treatment in the community and even has a peer support advocate who is working with him.  Past Psychiatric History: Patient has been admitted to the hospital on a couple of occasions. On other occasions he has come to the emergency room and been discharged. He had probably his most lengthy hospitalization last month and was discharged on the 25th on antidepressant medicine as well as Strattera. He has a history of threatening to hurt himself and putting himself in dangerous behavior. Not a history of severe violence. Doesn't have a history of psychosis or mania.  Risk to Self: Suicidal Ideation: No-Not Currently/Within Last 6 Months Suicidal Intent: No-Not Currently/Within Last 6 Months Is patient at risk for suicide?: No Suicidal Plan?: No-Not Currently/Within Last 6 Months Specify Current Suicidal Plan: "I covered my face." Access to Means: Yes Specify Access to Suicidal Means: Used a sheet, while in the ER What has been your use of drugs/alcohol within the last 12 months?: Cocaine and Alcohol Other Self Harm Risks: Active Addiction Triggers for Past  Attempts: Spouse contact, Other (Comment) (Active Addiction) Intentional Self Injurious Behavior: None Risk to Others: Homicidal Ideation: No Thoughts of Harm to Others: No-Not Currently Present/Within Last 6 Months Current Homicidal Intent:  No Current Homicidal Plan: No Access to Homicidal Means: No Identified Victim: Reports of none History of harm to others?: Yes Assessment of Violence: On admission Violent Behavior Description: Aggressive towards ER staff Does patient have access to weapons?: No Criminal Charges Pending?: Yes Describe Pending Criminal Charges: Misdemeanor COMMUNICATING THREATS  Does patient have a court date: Yes Court Date: 08/23/16 Prior Inpatient Therapy: Prior Inpatient Therapy: Yes Prior Therapy Dates: 06/2016, 05/2016 & 07/2011 (Per Medical Record) Prior Therapy Facilty/Provider(s): [facility] Reason for Treatment: Depression. SI. Substance abuse Prior Outpatient Therapy: Prior Outpatient Therapy: No Prior Therapy Dates: Reports of none Prior Therapy Facilty/Provider(s): Reports of none Reason for Treatment: Reports of none Does patient have an ACCT team?: No Does patient have Intensive In-House Services?  : No Does patient have [facility] services? : No Does patient have P4CC services?: No  Past Medical History:  Past Medical History:  Diagnosis Date  . Alcohol abuse   . Anxiety   . Cocaine abuse 05/27/2016   History reviewed. No pertinent surgical history. Family History: No family history on file. Family Psychiatric  History: Does not know of family history Social History:  History  Alcohol Use  . 6.0 oz/week  . 10 Cans of beer per week     History  Drug Use  . Types: Marijuana, Cocaine    Social History   Social History  . Marital status: Single    Spouse name: N/A  . Number of children: N/A  . Years of education: N/A   Social History Main Topics  . Smoking status: Current Every Day Smoker    Packs/day: 1.00  . Smokeless tobacco: Never Used  . Alcohol use 6.0 oz/week    10 Cans of beer per week  . Drug use:     Types: Marijuana, Cocaine  . Sexual activity: Yes    Birth control/ protection: None   Other Topics Concern  . None   Social History Narrative  . None    Additional Social History:    Allergies:   Allergies  Allergen Reactions  . Peanut Butter Flavor     Labs:  Results for orders placed or performed during the hospital encounter of 08/11/16 (from the past 48 hour(s))  Urine Drug Screen, Qualitative     Status: Abnormal   Collection Time: 08/10/16 11:52 PM  Result Value Ref Range   Tricyclic, Ur Screen NONE DETECTED NONE DETECTED   Amphetamines, Ur Screen NONE DETECTED NONE DETECTED   MDMA (Ecstasy)Ur Screen NONE DETECTED NONE DETECTED   Cocaine Metabolite,Ur [place] POSITIVE (A) NONE DETECTED   Opiate, Ur Screen NONE DETECTED NONE DETECTED   Phencyclidine (PCP) Ur S NONE DETECTED NONE DETECTED   Cannabinoid 50 Ng, Ur [place] NONE DETECTED NONE DETECTED   Barbiturates, Ur Screen NONE DETECTED NONE DETECTED   Benzodiazepine, Ur Scrn NONE DETECTED NONE DETECTED   Methadone Scn, Ur NONE DETECTED NONE DETECTED    Comment: (NOTE) 786  Tricyclics, urine               Cutoff 1000 ng/mL 200  Amphetamines, urine             Cutoff 1000 ng/mL 300  MDMA (Ecstasy), urine           Cutoff 500 ng/mL 400  Cocaine Metabolite, urine  Cutoff 300 ng/mL 500  Opiate, urine                   Cutoff 300 ng/mL 600  Phencyclidine (PCP), urine      Cutoff 25 ng/mL 700  Cannabinoid, urine              Cutoff 50 ng/mL 800  Barbiturates, urine             Cutoff 200 ng/mL 900  Benzodiazepine, urine           Cutoff 200 ng/mL 1000 Methadone, urine                Cutoff 300 ng/mL 1100 1200 The urine drug screen provides only a preliminary, unconfirmed 1300 analytical test result and should not be used for non-medical 1400 purposes. Clinical consideration and professional judgment should 1500 be applied to any positive drug screen result due to possible 1600 interfering substances. A more specific alternate chemical method 1700 must be used in order to obtain a confirmed analytical result.  1800 Gas chromato graphy / mass spectrometry (GC/MS) is the  preferred 1900 confirmatory method.   Comprehensive metabolic panel     Status: Abnormal   Collection Time: 08/10/16 11:58 PM  Result Value Ref Range   Sodium 139 135 - 145 mmol/L   Potassium 3.9 3.5 - 5.1 mmol/L   Chloride 105 101 - 111 mmol/L   CO2 25 22 - 32 mmol/L   Glucose, Bld 86 65 - 99 mg/dL   BUN 8 6 - 20 mg/dL   Creatinine, Ser 0.84 0.61 - 1.24 mg/dL   Calcium 8.7 (L) 8.9 - 10.3 mg/dL   Total Protein 7.7 6.5 - 8.1 g/dL   Albumin 4.3 3.5 - 5.0 g/dL   AST 26 15 - 41 U/L   ALT 20 17 - 63 U/L   Alkaline Phosphatase 83 38 - 126 U/L   Total Bilirubin 0.6 0.3 - 1.2 mg/dL   GFR calc non Af Amer >60 >60 mL/min   GFR calc Af Amer >60 >60 mL/min    Comment: (NOTE) The eGFR has been calculated using the CKD EPI equation. This calculation has not been validated in all clinical situations. eGFR's persistently <60 mL/min signify possible Chronic Kidney Disease.    Anion gap 9 5 - 15  Ethanol     Status: Abnormal   Collection Time: 08/10/16 11:58 PM  Result Value Ref Range   Alcohol, Ethyl (B) 292 (H) <5 mg/dL    Comment:        LOWEST DETECTABLE LIMIT FOR SERUM ALCOHOL IS 5 mg/dL FOR MEDICAL PURPOSES ONLY   Salicylate level     Status: None   Collection Time: 08/10/16 11:58 PM  Result Value Ref Range   Salicylate Lvl <1.4 2.8 - 30.0 mg/dL  Acetaminophen level     Status: Abnormal   Collection Time: 08/10/16 11:58 PM  Result Value Ref Range   Acetaminophen (Tylenol), Serum <10 (L) 10 - 30 ug/mL    Comment:        THERAPEUTIC CONCENTRATIONS VARY SIGNIFICANTLY. A RANGE OF 10-30 ug/mL MAY BE AN EFFECTIVE CONCENTRATION FOR MANY PATIENTS. HOWEVER, SOME ARE BEST TREATED AT CONCENTRATIONS OUTSIDE THIS RANGE. ACETAMINOPHEN CONCENTRATIONS >150 ug/mL AT 4 HOURS AFTER INGESTION AND >50 ug/mL AT 12 HOURS AFTER INGESTION ARE OFTEN ASSOCIATED WITH TOXIC REACTIONS.   cbc     Status: None   Collection Time: 08/10/16 11:58 PM  Result Value Ref Range  WBC 6.8 3.8 - 10.6 K/uL    RBC 4.84 4.40 - 5.90 MIL/uL   Hemoglobin 16.4 13.0 - 18.0 g/dL   HCT 47.8 40.0 - 52.0 %   MCV 98.6 80.0 - 100.0 fL   MCH 33.8 26.0 - 34.0 pg   MCHC 34.3 32.0 - 36.0 g/dL   RDW 13.3 11.5 - 14.5 %   Platelets 175 150 - 440 K/uL    No current facility-administered medications for this encounter.    Current Outpatient Prescriptions  Medication Sig Dispense Refill  . acetaminophen (TYLENOL) 500 MG tablet Take 2 tablets (1,000 mg total) by mouth every 6 (six) hours as needed for mild pain. 30 tablet 0  . atomoxetine (STRATTERA) 40 MG capsule Take 1 capsule (40 mg total) by mouth daily. 30 capsule 0  . citalopram (CELEXA) 20 MG tablet Take 1 tablet (20 mg total) by mouth daily. 30 tablet 0  . ibuprofen (ADVIL,MOTRIN) 200 MG tablet Take 3 tablets (600 mg total) by mouth every 6 (six) hours as needed for moderate pain.    Marland Kitchen omeprazole (PRILOSEC) 20 MG capsule Take 20 mg by mouth 2 (two) times daily before a meal.    . traZODone (DESYREL) 150 MG tablet Take by mouth at bedtime.      Musculoskeletal: Strength & Muscle Tone: within normal limits Gait & Station: normal Patient leans: N/A  Psychiatric Specialty Exam: Physical Exam  Nursing note and vitals reviewed. Constitutional: He appears well-developed and well-nourished.  HENT:  Head: Normocephalic and atraumatic.  Eyes: Conjunctivae are normal. Pupils are equal, round, and reactive to light.  Neck: Normal range of motion.  Cardiovascular: Regular rhythm and normal heart sounds.   Respiratory: Effort normal. No respiratory distress.  GI: Soft.  Musculoskeletal: Normal range of motion.  Neurological: He is alert.  Skin: Skin is warm and dry.  Psychiatric: His speech is delayed. He is slowed. He expresses impulsivity. He exhibits a depressed mood. He expresses suicidal ideation. He expresses no suicidal plans. He exhibits abnormal recent memory.    Review of Systems  Constitutional: Negative.   HENT: Negative.   Eyes: Negative.    Respiratory: Negative.   Cardiovascular: Negative.   Gastrointestinal: Negative.   Musculoskeletal: Negative.   Skin: Negative.   Neurological: Negative.   Psychiatric/Behavioral: Positive for depression, memory loss, substance abuse and suicidal ideas. Negative for hallucinations. The patient is nervous/anxious and has insomnia.     Blood pressure (!) 119/99, pulse 100, temperature 98.7 F (37.1 C), temperature source Oral, resp. rate 18, height 5' 6"  (1.676 m), weight 90.7 kg (200 lb), SpO2 99 %.Body mass index is 32.28 kg/m.  General Appearance: Disheveled  Eye Contact:  Minimal  Speech:  Slow and Slurred  Volume:  Decreased  Mood:  Dysphoric  Affect:  Congruent  Thought Process:  Goal Directed  Orientation:  Full (Time, Place, and Person)  Thought Content:  Rumination  Suicidal Thoughts:  Yes.  without intent/plan  Homicidal Thoughts:  No  Memory:  Immediate;   Fair Recent;   Poor Remote;   Fair  Judgement:  Impaired  Insight:  Shallow  Psychomotor Activity:  Decreased  Concentration:  Concentration: Fair  Recall:  [employer] of Knowledge:  Fair  Language:  Fair  Akathisia:  No  Handed:  Right  AIMS (if indicated):     Assets:  Housing Physical Health Resilience Social Support  ADL's:  Intact  Cognition:  WNL  Sleep:        Treatment  Plan Summary: Plan This 41 year old man presented to the hospital intoxicated. He is sobering up today. Patient is not showing any signs of psychosis. He does not voice any intention or plan to kill himself. His affect and behavior are typical for him especially when he has just come off of drinking. Patient would not benefit from inpatient hospitalization. He already has a well formulated outpatient plan with [facility]. Was given medicine at last discharge which she should still have. No indication to change any medicine. Patient will be taken off of involuntary commitment. Supportive counseling and encouragement to get back into sobriety  and follow-up with [facility]. Case reviewed with emergency room doctor and TTS.  Disposition: Patient does not meet criteria for psychiatric inpatient admission. Supportive therapy provided about ongoing stressors.  Alethia Berthold, MD 08/11/2016 2:21 PM

## 2016-08-11 NOTE — ED Provider Notes (Signed)
[facility] Emergency Department Provider Note  ____________________________________________   First MD Initiated Contact with Patient 08/11/16 0013     (approximate)  I have reviewed the triage vital signs and the nursing notes.   HISTORY  Chief Complaint Mental Health Problem   HPI Alex [name] is a 40 y.o. male with a history of polysubstance abuse as well as mood disorder who is presenting to the emergency department today saying "I just want to die." He was initially put on a hallway bed with a sheet and started pulling the sheet over his head in an attempt to suffocate himself.  However, he was able to breathe through the sheet. He was not forthcoming with his history although he did admit to drinking tonight. He denied any drug use or self-harm prior to arrival.   Past Medical History:  Diagnosis Date  . Alcohol abuse   . Anxiety   . Cocaine abuse 05/27/2016    Patient Active Problem List   Diagnosis Date Noted  . Alcohol use disorder, moderate, dependence (HCC) 06/28/2016  . Alcohol withdrawal ([facility]) 06/28/2016  . Cocaine use disorder, moderate, dependence ([facility]) 06/28/2016  . Unspecified Depressive disorder 06/28/2016  . Tobacco use disorder 06/28/2016  . Noncompliance 06/26/2016    History reviewed. No pertinent surgical history.  Prior to Admission medications   Medication Sig Start Date End Date Taking? Authorizing Provider  acetaminophen (TYLENOL) 500 MG tablet Take 2 tablets (1,000 mg total) by mouth every 6 (six) hours as needed for mild pain. 07/21/16   Jimmy FootmanAndrea Hernandez-Gonzalez, MD  atomoxetine (STRATTERA) 40 MG capsule Take 1 capsule (40 mg total) by mouth daily. 06/29/16   Jimmy FootmanAndrea Hernandez-Gonzalez, MD  citalopram (CELEXA) 20 MG tablet Take 1 tablet (20 mg total) by mouth daily. 06/30/16   Jimmy FootmanAndrea Hernandez-Gonzalez, MD  ibuprofen (ADVIL,MOTRIN) 200 MG tablet Take 3 tablets (600 mg total) by mouth every 6 (six) hours as needed for  moderate pain. 07/21/16   Jimmy FootmanAndrea Hernandez-Gonzalez, MD  omeprazole (PRILOSEC) 20 MG capsule Take 20 mg by mouth 2 (two) times daily before a meal.    Historical Provider, MD  traZODone (DESYREL) 150 MG tablet Take by mouth at bedtime.    Historical Provider, MD    Allergies Peanut butter flavor  No family history on file.  Social History Social History  Substance Use Topics  . Smoking status: Current Every Day Smoker    Packs/day: 1.00  . Smokeless tobacco: Never Used  . Alcohol use 6.0 oz/week    10 Cans of beer per week    Review of Systems Caveat secondary to patient refusing to answer questions.  ____________________________________________   PHYSICAL EXAM:  VITAL SIGNS: ED Triage Vitals  Enc Vitals Group     BP 08/10/16 2356 115/67     Pulse Rate 08/10/16 2356 (!) 109     Resp 08/10/16 2356 18     Temp 08/10/16 2356 97.8 F (36.6 C)     Temp Source 08/10/16 2356 Oral     SpO2 08/10/16 2356 97 %     Weight 08/10/16 2355 200 lb (90.7 kg)     Height 08/10/16 2355 5\' 6"  (1.676 m)     Head Circumference --      Peak Flow --      Pain Score --      Pain Loc --      Pain Edu? --      Excl. in GC? --     Constitutional: Alert  and oriented. Appears agitated. Refusing to answer questions. Eyes: Conjunctivae are normal. PERRL. EOMI. Head: Atraumatic. Nose: No congestion/rhinnorhea. Mouth/Throat: Mucous membranes are moist.   Neck: No stridor.   Cardiovascular: Normal rate, regular rhythm. Grossly normal heart sounds.   Respiratory: Normal respiratory effort.  No retractions. Lungs CTAB. Gastrointestinal: Soft and nontender. No distention. Musculoskeletal: No lower extremity tenderness nor edema.  No joint effusions. Neurologic:  Normal speech and language. No gross focal neurologic deficits are appreciated. No gait instability. Skin:  Skin is warm, dry and intact. No rash noted. Psychiatric: Agitated.  ____________________________________________    LABS (all labs ordered are listed, but only abnormal results are displayed)  Labs Reviewed  COMPREHENSIVE METABOLIC PANEL - Abnormal; Notable for the following:       Result Value   Calcium 8.7 (*)    All other components within normal limits  ETHANOL - Abnormal; Notable for the following:    Alcohol, Ethyl (B) 292 (*)    All other components within normal limits  ACETAMINOPHEN LEVEL - Abnormal; Notable for the following:    Acetaminophen (Tylenol), Serum <10 (*)    All other components within normal limits  URINE DRUG SCREEN, QUALITATIVE ([facility] ONLY) - Abnormal; Notable for the following:    Cocaine Metabolite,Ur [place] POSITIVE (*)    All other components within normal limits  SALICYLATE LEVEL  CBC   ____________________________________________  EKG   ____________________________________________  RADIOLOGY   ____________________________________________   PROCEDURES  Procedure(s) performed:   Procedures  Critical Care performed:   ____________________________________________   INITIAL IMPRESSION / ASSESSMENT AND PLAN / ED COURSE  Pertinent labs & imaging results that were available during my care of the patient were reviewed by me and considered in my medical decision making (see chart for details).  Patient moved into room from the hallway. Stretcher was removed from the room and she was removed from the mattress with his illness and was left in the room. He became very agitated when being moved to the room. He started cursing at the ER staff and held a closed fist up at a Engineer, materialssecurity officer. Charges were filed by the Tax advisersecurity officers. Involuntary commitment paperwork was completed. He was given 20 mg of Geodon as well as 50 mg of Benadryl IM. He continued to be agitated and then was given 2 mg of Versed.  Clinical Course    ----------------------------------------- 3:20 AM on 08/11/2016 -----------------------------------------  Patient sedated at this time.  Breathing with a normal rate.  ____________________________________________   FINAL CLINICAL IMPRESSION(S) / ED DIAGNOSES   Polysubstance abuse. Aggressive behavior.   NEW MEDICATIONS STARTED DURING THIS VISIT:  New Prescriptions   No medications on file     Note:  This document was prepared using Dragon voice recognition software and may include unintentional dictation errors.    Myrna Blazeravid Matthew Daschel Roughton, MD 08/11/16 [phone]
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
## 2016-08-11 NOTE — ED Notes (Signed)
Melony OverlyKuch, RN in triage to change pt into behav scrubs

## 2016-08-11 NOTE — Discharge Instructions (Signed)
Return to the emergency department if you develop thoughts of hurting herself or anyone else, hallucinations, or any other symptoms concerning to you. °

## 2016-08-11 NOTE — ED Notes (Addendum)
All pt belongings returned to pt - he dressed himself - discharge papers reviewed - pt ride is in lobby - pt is alert and oriented x4 - states the only reason he came to hospital was to avoid going to jail - pt denies SI/HI - security checked to make sure that pt did not have warrants prior to release - he was noted to have a warrant for probation violation but they did not want to retain him her at the hospital

## 2016-09-09 ENCOUNTER — Emergency Department: Payer: Medicaid Other

## 2016-09-09 ENCOUNTER — Emergency Department
Admission: EM | Admit: 2016-09-09 | Discharge: 2016-09-09 | Disposition: A | Discharge status: Home / Self Care | Payer: Medicaid Other | Attending: Emergency Medicine | Admitting: Emergency Medicine

## 2016-09-09 DIAGNOSIS — Z79899 Other long term (current) drug therapy: Secondary | ICD-10-CM | POA: Insufficient documentation

## 2016-09-09 DIAGNOSIS — Y9389 Activity, other specified: Secondary | ICD-10-CM | POA: Insufficient documentation

## 2016-09-09 DIAGNOSIS — F17200 Nicotine dependence, unspecified, uncomplicated: Secondary | ICD-10-CM | POA: Insufficient documentation

## 2016-09-09 DIAGNOSIS — Y92410 Unspecified street and highway as the place of occurrence of the external cause: Secondary | ICD-10-CM | POA: Insufficient documentation

## 2016-09-09 DIAGNOSIS — Z791 Long term (current) use of non-steroidal anti-inflammatories (NSAID): Secondary | ICD-10-CM | POA: Insufficient documentation

## 2016-09-09 DIAGNOSIS — S022XXA Fracture of nasal bones, initial encounter for closed fracture: Secondary | ICD-10-CM | POA: Diagnosis not present

## 2016-09-09 DIAGNOSIS — Y999 Unspecified external cause status: Secondary | ICD-10-CM | POA: Insufficient documentation

## 2016-09-09 DIAGNOSIS — F909 Attention-deficit hyperactivity disorder, unspecified type: Secondary | ICD-10-CM | POA: Insufficient documentation

## 2016-09-09 DIAGNOSIS — S0992XA Unspecified injury of nose, initial encounter: Secondary | ICD-10-CM | POA: Diagnosis present

## 2016-09-09 HISTORY — DX: Major depressive disorder, single episode, unspecified: F32.9

## 2016-09-09 HISTORY — DX: Depression, unspecified: F32.A

## 2016-09-09 NOTE — ED Notes (Signed)
ED Provider at bedside. 
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
## 2016-09-09 NOTE — ED Notes (Signed)
RN obtained two vials of blood with Officers Dunnigan, Quarry managerBonaker and Christell ConstantMoore present as witnesses. Cleaned site prior with betadine solution for 30 seconds and allowed to dry completely for 30 seconds before inserting needle. Inverted both tubes of blood 10 times. Process explained to patient prior to beginning blood draw. Applied bandage to site after blood draw.

## 2016-09-09 NOTE — ED Notes (Signed)
Pt reports he began drinking beer at 0600 today.

## 2016-09-09 NOTE — ED Notes (Signed)
Reviewed discharge instructions, follow-up care, use of ice with patient. Pt verbalized understanding of all instructions.

## 2016-11-09 ENCOUNTER — Encounter: Payer: Self-pay | Admitting: Emergency Medicine

## 2016-11-09 ENCOUNTER — Emergency Department: Payer: Medicaid Other

## 2016-11-09 ENCOUNTER — Emergency Department
Admission: EM | Admit: 2016-11-09 | Discharge: 2016-11-10 | Disposition: A | Discharge status: Home / Self Care | Payer: Medicaid Other | Attending: Emergency Medicine | Admitting: Emergency Medicine

## 2016-11-09 DIAGNOSIS — F909 Attention-deficit hyperactivity disorder, unspecified type: Secondary | ICD-10-CM | POA: Diagnosis not present

## 2016-11-09 DIAGNOSIS — L03116 Cellulitis of left lower limb: Secondary | ICD-10-CM | POA: Insufficient documentation

## 2016-11-09 DIAGNOSIS — Y9389 Activity, other specified: Secondary | ICD-10-CM | POA: Diagnosis not present

## 2016-11-09 DIAGNOSIS — Z791 Long term (current) use of non-steroidal anti-inflammatories (NSAID): Secondary | ICD-10-CM | POA: Insufficient documentation

## 2016-11-09 DIAGNOSIS — S93492A Sprain of other ligament of left ankle, initial encounter: Secondary | ICD-10-CM | POA: Insufficient documentation

## 2016-11-09 DIAGNOSIS — F17200 Nicotine dependence, unspecified, uncomplicated: Secondary | ICD-10-CM | POA: Diagnosis not present

## 2016-11-09 DIAGNOSIS — Y929 Unspecified place or not applicable: Secondary | ICD-10-CM | POA: Insufficient documentation

## 2016-11-09 DIAGNOSIS — Y990 Civilian activity done for income or pay: Secondary | ICD-10-CM | POA: Insufficient documentation

## 2016-11-09 DIAGNOSIS — W501XXA Accidental kick by another person, initial encounter: Secondary | ICD-10-CM | POA: Insufficient documentation

## 2016-11-09 DIAGNOSIS — S99912A Unspecified injury of left ankle, initial encounter: Secondary | ICD-10-CM | POA: Diagnosis present

## 2016-11-09 NOTE — ED Triage Notes (Addendum)
Patient comes in from home via EMS with an ankle injury 2 days ago. 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 Left ankle has swelling and discoloration. Patient able to move toes with pain. Cap refill < 3 sec. Color on toes good, pulses intact. Patient does smell of ETOH. Patient does have scratch marks on tops of feet bilaterally per EMS from patient eating peanuts and having an allergy to peanuts.

## 2016-11-09 NOTE — ED Notes (Signed)
Patient states he has had 1 "small" beer.

## 2016-11-10 MED ORDER — KETOROLAC TROMETHAMINE 10 MG PO TABS: 10.0000 mg | ORAL_TABLET | Freq: Four times a day (QID) | ORAL | 0 refills | Status: DC | PRN

## 2016-11-10 MED ORDER — KETOROLAC TROMETHAMINE 60 MG/2ML IM SOLN
60.0000 mg | Freq: Once | INTRAMUSCULAR | Status: AC
  Administered 2016-11-10: 60 mg via INTRAMUSCULAR
  Filled 2016-11-10: qty 2

## 2016-11-10 MED ORDER — CEPHALEXIN 500 MG PO CAPS
500.0000 mg | ORAL_CAPSULE | Freq: Once | ORAL | Status: AC
  Administered 2016-11-10: 500 mg via ORAL
  Filled 2016-11-10: qty 1

## 2016-11-10 MED ORDER — SULFAMETHOXAZOLE-TRIMETHOPRIM 800-160 MG PO TABS: 1.0000 | ORAL_TABLET | Freq: Two times a day (BID) | ORAL | 0 refills | Status: DC

## 2016-11-10 NOTE — ED Notes (Signed)
Asked patient about calling for a ride home. Patient said he won't. Patient states he doesn't have any friends or family to call. Charge nurse at bedside now.

## 2016-11-10 NOTE — ED Notes (Signed)
ED Provider at bedside. 

## 2016-11-10 NOTE — ED Provider Notes (Signed)
[facility] Emergency Department Provider Note   First MD Initiated Contact with Patient 11/10/16 [phone]     (approximate)  I have reviewed the triage vital signs and the nursing notes.   HISTORY  Chief Complaint Ankle Pain    HPI Alex [name] is a 41 y.o. male with a bolus of chronic medical conditions presents to the emergency department with 10 out of 10 left ankle pain 2 days. Patient states injury occurred while he was working. Patient states the pain is worse with ambulation and any movement of the ankle. In addition patient admits to multiple abrasions on the lower extremity secondary to itching following eating peanuts. Patient has a known peanut allergy.   Past Medical History:  Diagnosis Date  . Alcohol abuse   . Anxiety   . Cocaine abuse 05/27/2016  . Depression     Patient Active Problem List   Diagnosis Date Noted  . Substance induced mood disorder (HCC) 08/11/2016  . ADHD 08/11/2016  . Alcohol use disorder, moderate, dependence (HCC) 06/28/2016  . Alcohol withdrawal (HCC) 06/28/2016  . Cocaine use disorder, moderate, dependence (HCC) 06/28/2016  . Unspecified Depressive disorder 06/28/2016  . Tobacco use disorder 06/28/2016  . Noncompliance 06/26/2016    History reviewed. No pertinent surgical history.  Prior to Admission medications   Medication Sig Start Date End Date Taking? Authorizing Provider  acetaminophen (TYLENOL) 500 MG tablet Take 2 tablets (1,000 mg total) by mouth every 6 (six) hours as needed for mild pain. 07/21/16   Jimmy Footman, MD  atomoxetine (STRATTERA) 40 MG capsule Take 1 capsule (40 mg total) by mouth daily. 06/29/16   Jimmy Footman, MD  citalopram (CELEXA) 20 MG tablet Take 1 tablet (20 mg total) by mouth daily. 06/30/16   Jimmy Footman, MD  ibuprofen (ADVIL,MOTRIN) 200 MG tablet Take 3 tablets (600 mg total) by mouth every 6 (six) hours as needed for moderate pain.  07/21/16   Jimmy Footman, MD  omeprazole (PRILOSEC) 20 MG capsule Take 20 mg by mouth 2 (two) times daily before a meal.    Historical Provider, MD  traZODone (DESYREL) 150 MG tablet Take by mouth at bedtime.    Historical Provider, MD    Allergies Peanut butter flavor  No family history on file.  Social History Social History  Substance Use Topics  . Smoking status: Current Every Day Smoker    Packs/day: 1.00  . Smokeless tobacco: Never Used  . Alcohol use 6.0 oz/week    10 Cans of beer per week    Review of Systems Constitutional: No fever/chills Eyes: No visual changes. ENT: No sore throat. Cardiovascular: Denies chest pain. Respiratory: Denies shortness of breath. Gastrointestinal: No abdominal pain.  No nausea, no vomiting.  No diarrhea.  No constipation. Genitourinary: Negative for dysuria. Musculoskeletal: Negative for back pain.Positive for left leg and ankle pain. Skin: Negative for rash. Neurological: Negative for headaches, focal weakness or numbness.  10-point ROS otherwise negative.  ____________________________________________   PHYSICAL EXAM:  VITAL SIGNS: ED Triage Vitals  Enc Vitals Group     BP 11/09/16 2321 97/74     Pulse Rate 11/09/16 2321 85     Resp 11/09/16 2321 18     Temp 11/09/16 2321 97.4 F (36.3 C)     Temp Source 11/09/16 2321 Oral     SpO2 11/09/16 2321 98 %     Weight 11/09/16 2321 200 lb (90.7 kg)     Height 11/09/16 2321 5\' 6"  (1.676  m)     Head Circumference --      Peak Flow --      Pain Score 11/09/16 2322 9     Pain Loc --      Pain Edu? --      Excl. in GC? --     Constitutional: Alert and oriented. Well appearing and in no acute distress. Eyes: Conjunctivae are normal. PERRL. EOMI. Head: Atraumatic. Mouth/Throat: Mucous membranes are moist. Neck: No stridor. Cardiovascular: Normal rate, regular rhythm. Good peripheral circulation. Grossly normal heart sounds. Respiratory: Normal respiratory effort.   No retractions. Lungs CTAB. Gastrointestinal: Soft and nontender. No distention.  Musculoskeletal: No lower extremity tenderness nor edema. No gross deformities of extremities. Neurologic:  Normal speech and language. No gross focal neurologic deficits are appreciated.  Skin:  Abrasion noted dorsal aspect of both feet with surrounding erythema on the left extending up to mid calf. Psychiatric: Mood and affect are normal. Speech and behavior are normal.    RADIOLOGY I, [place] F[place], personally viewed and evaluated these images (plain radiographs) as part of my medical decision making, as well as reviewing the written report by the radiologist.  Dg Tibia/fibula Left  Result Date: 11/10/2016 CLINICAL DATA:  Pain EXAM: LEFT TIBIA AND FIBULA - 2 VIEW COMPARISON:  Left foot radiographs from 01/06/2014 FINDINGS: Mild soft tissue swelling about the left leg. Well corticated ossific densities and soft tissue calcifications are again seen off the tip of the fibula likely related to old remote trauma. These are present on the 2015 comparison foot radiographs. Soft tissue swelling about the malleoli. No acute fracture of the tibia nor fibula. Ankle and knee joints appear congruent. IMPRESSION: No acute osseous abnormality of the left tibia nor fibula. Soft tissue swelling of the left leg and about the malleoli. Electronically Signed   By: Tollie Ethavid  Kwon M.D.   On: 11/10/2016 00:30   Dg Ankle Complete Left  Result Date: 11/10/2016 CLINICAL DATA:  Pain after ankle injury. Leg swelling and discoloration. EXAM: LEFT ANKLE COMPLETE - 3+ VIEW COMPARISON:  None. FINDINGS: Soft tissue swelling about the malleoli. The ankle mortise is congruent. Osteoarthritic spurring is noted off the tibial plafond and dorsum of the talus. Well corticated ossifications project off tip of the fibula likely related to old remote trauma. Calcified densities along the lateral aspect of the ankle may reflect calcific tendinopathy. No  definite avulsion is seen. Prominent plantar calcaneal spurring. No significant ankle joint effusion. There is mild soft tissue induration of the visualized leg. IMPRESSION: Soft tissue swelling without acute fracture or dislocation of the left ankle. Electronically Signed   By: Tollie Ethavid  Kwon M.D.   On: 11/10/2016 00:26    ____________________________________________     Procedures    ____________________________________________   INITIAL IMPRESSION / ASSESSMENT AND PLAN / ED COURSE  Pertinent labs & imaging results that were available during my care of the patient were reviewed by me and considered in my medical decision making (see chart for details).  Patient states that he sustained the abrasions to his feet secondary to scratching after eating peanuts. The patient states that he has a known allergy however he continues to eat them.      ____________________________________________  FINAL CLINICAL IMPRESSION(S) / ED DIAGNOSES  Final diagnoses:  Left leg cellulitis  Sprain of anterior talofibular ligament of left ankle, initial encounter     MEDICATIONS GIVEN DURING THIS VISIT:  Medications  ketorolac (TORADOL) injection 60 mg (not administered)  cephALEXin (KEFLEX) capsule  500 mg (not administered)     NEW OUTPATIENT MEDICATIONS STARTED DURING THIS VISIT:  New Prescriptions   No medications on file    Modified Medications   No medications on file    Discontinued Medications   No medications on file     Note:  This document was prepared using Dragon voice recognition software and may include unintentional dictation errors.    Darci Current, MD 11/10/16 [phone]

## 2016-11-10 NOTE — ED Notes (Signed)
Patient given a sandwich. Will let pain medication kick in and then we will apply splint. Patient agreeable to plan.

## 2016-11-19 ENCOUNTER — Encounter: Payer: Self-pay | Admitting: *Deleted

## 2016-11-19 ENCOUNTER — Emergency Department
Admission: EM | Admit: 2016-11-19 | Discharge: 2016-11-19 | Disposition: A | Discharge status: Home / Self Care | Payer: Medicaid Other | Attending: Emergency Medicine | Admitting: Emergency Medicine

## 2016-11-19 DIAGNOSIS — Z4689 Encounter for fitting and adjustment of other specified devices: Secondary | ICD-10-CM | POA: Diagnosis present

## 2016-11-19 DIAGNOSIS — Z79899 Other long term (current) drug therapy: Secondary | ICD-10-CM | POA: Diagnosis not present

## 2016-11-19 DIAGNOSIS — F909 Attention-deficit hyperactivity disorder, unspecified type: Secondary | ICD-10-CM | POA: Diagnosis not present

## 2016-11-19 DIAGNOSIS — F17200 Nicotine dependence, unspecified, uncomplicated: Secondary | ICD-10-CM | POA: Insufficient documentation

## 2016-11-19 DIAGNOSIS — Z4789 Encounter for other orthopedic aftercare: Secondary | ICD-10-CM

## 2016-11-19 NOTE — Discharge Instructions (Signed)
Keep the splint clean and dry, if you have numbness or tingling in your fingers loosen the Ace wrap. If that does not work seek medical attention. Make sure that you return to see her surgeons in the next day or 2 or as scheduled. If you  have any signs of infection including fever or increased pain or redness return to the emergency room. Please be sure to use paramedics only for significant life-threatening disease, usually having a splint checked in the emergency room is not enough to warrant an ambulance ride however we are here and so are they if needed for emergencies.

## 2016-11-19 NOTE — ED Provider Notes (Signed)
[facility] Emergency Department Provider Note  ____________________________________________   I have reviewed the triage vital signs and the nursing notes.   HISTORY  Chief Complaint Arm Pain    HPI Alex [name] is a 41 y.o. male presents today complaining of wanting me to evaluate his splint. Surgery on a broken fifth phalanx 2 days ago. He has no fever or other complaints did not fall but he is worried that the splint doesn't look right. He has no pain radiating does not have any tingling or numbness. Patient's very well-known to [place] frequently here. Marland Kitchen  He wants to make sure the pins are okay from the surgery.   Past Medical History:  Diagnosis Date  . Alcohol abuse   . Anxiety   . Cocaine abuse 05/27/2016  . Depression     Patient Active Problem List   Diagnosis Date Noted  . Substance induced mood disorder (HCC) 08/11/2016  . ADHD 08/11/2016  . Alcohol use disorder, moderate, dependence (HCC) 06/28/2016  . Alcohol withdrawal (HCC) 06/28/2016  . Cocaine use disorder, moderate, dependence (HCC) 06/28/2016  . Unspecified Depressive disorder 06/28/2016  . Tobacco use disorder 06/28/2016  . Noncompliance 06/26/2016    No past surgical history on file.  Prior to Admission medications   Medication Sig Start Date End Date Taking? Authorizing Provider  acetaminophen (TYLENOL) 500 MG tablet Take 2 tablets (1,000 mg total) by mouth every 6 (six) hours as needed for mild pain. 07/21/16   Jimmy Footman, MD  atomoxetine (STRATTERA) 40 MG capsule Take 1 capsule (40 mg total) by mouth daily. 06/29/16   Jimmy Footman, MD  citalopram (CELEXA) 20 MG tablet Take 1 tablet (20 mg total) by mouth daily. 06/30/16   Jimmy Footman, MD  ibuprofen (ADVIL,MOTRIN) 200 MG tablet Take 3 tablets (600 mg total) by mouth every 6 (six) hours as needed for moderate pain. 07/21/16   Jimmy Footman, MD  ketorolac (TORADOL) 10 MG  tablet Take 1 tablet (10 mg total) by mouth every 6 (six) hours as needed. 11/10/16   Darci Current, MD  omeprazole (PRILOSEC) 20 MG capsule Take 20 mg by mouth 2 (two) times daily before a meal.    Historical Provider, MD  sulfamethoxazole-trimethoprim (BACTRIM DS,SEPTRA DS) 800-160 MG tablet Take 1 tablet by mouth 2 (two) times daily. 11/10/16   Darci Current, MD  traZODone (DESYREL) 150 MG tablet Take by mouth at bedtime.    Historical Provider, MD    Allergies Peanut butter flavor  No family history on file.  Social History Social History  Substance Use Topics  . Smoking status: Current Every Day Smoker    Packs/day: 1.00  . Smokeless tobacco: Never Used  . Alcohol use 6.0 oz/week    10 Cans of beer per week    Review of Systems Constitutional: No fever/chills Eyes: No visual changes. ENT: No sore throat. No stiff neck no neck pain Cardiovascular: Denies chest pain. Respiratory: Denies shortness of breath. Gastrointestinal:   no vomiting.  No diarrhea.  No constipation. Genitourinary: Negative for dysuria. Musculoskeletal: Negative lower extremity swelling Skin: Negative for rash. Neurological: Negative for severe headaches, focal weakness or numbness. 10-point ROS otherwise negative.  ____________________________________________   PHYSICAL EXAM:  VITAL SIGNS: ED Triage Vitals  Enc Vitals Group     BP 11/19/16 0027 (!) 130/94     Pulse Rate 11/19/16 0027 (!) 116     Resp 11/19/16 0027 20     Temp 11/19/16 0027 98 F (  36.7 C)     Temp Source 11/19/16 0027 Oral     SpO2 11/19/16 0027 95 %     Weight 11/19/16 0028 200 lb (90.7 kg)     Height 11/19/16 0028 5\' 5"  (1.651 m)     Head Circumference --      Peak Flow --      Pain Score 11/19/16 0028 10     Pain Loc --      Pain Edu? --      Excl. in GC? --     Constitutional: Alert and oriented. Well appearing and in no acute distress. Eyes: Conjunctivae are normal. PERRL. EOMI. Head:  Atraumatic. Musculoskeletal: The splint appears to be taken off by the patient and put back on by the patient in a way that does not seem to be consistent with medical application of a splint. However, his fingers are intact in terms of Refill which is brisk, sensation is intact for range of motion. I did take down the splint and evaluated the wound site. This pins are in place there is no evidence of infection is not red is not hot to touch etc. Neurologic:  Normal speech and language. No gross focal neurologic deficits are appreciated.  Skin:  Skin is warm, dry and intact. No rash noted. Psychiatric: Mood and affect are normal. Speech and behavior are normal.  ____________________________________________   LABS (all labs ordered are listed, but only abnormal results are displayed)  Labs Reviewed - No data to display ____________________________________________  EKG   ____________________________________________  RADIOLOGY  I reviewed any imaging ordered by me or triage that were performed during my shift and, if possible, patient and/or family made aware of any abnormal findings. ____________________________________________   PROCEDURES  Procedure(s) performed: None  Procedures  Critical Care performed: None  ____________________________________________   INITIAL IMPRESSION / ASSESSMENT AND PLAN / ED COURSE  Pertinent labs & imaging results that were available during my care of the patient were reviewed by me and considered in my medical decision making (see chart for details).  Patient apparently took an embolus in the middle the night so I could look at the splint. We took his splint off because he apparently messed up somehow and we are replacing it with a sugar tong splint which appears to be what he had on initially. He is neurovascular intact with no evidence of surgical complication at this time. Patient is now requesting that we pay for a ride home for him because  he has no other way to get home. Return precautions and follow-up given and understood    ____________________________________________   FINAL CLINICAL IMPRESSION(S) / ED DIAGNOSES  Final diagnoses:  None      This chart was dictated using voice recognition software.  Despite best efforts to proofread,  errors can occur which can change meaning.      Jeanmarie PlantJames A Fallynn Gravett, MD 11/19/16 [phone]

## 2016-11-19 NOTE — ED Triage Notes (Signed)
Pt brought in via ems.  Pt has a cast on right arm.  Pt states pins in fingers/hand are hurting tonight.  Pt states he fell today and now hand hurts worse. etoh use today.

## 2016-11-19 NOTE — ED Notes (Signed)
Pt returned to lobby; taken to room 17
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
## 2016-11-26 ENCOUNTER — Encounter: Payer: Self-pay | Admitting: Emergency Medicine

## 2016-11-26 ENCOUNTER — Emergency Department
Admission: EM | Admit: 2016-11-26 | Discharge: 2016-11-27 | Payer: Medicaid Other | Attending: Emergency Medicine | Admitting: Emergency Medicine

## 2016-11-26 ENCOUNTER — Emergency Department: Payer: Medicaid Other

## 2016-11-26 ENCOUNTER — Emergency Department
Admission: EM | Admit: 2016-11-26 | Discharge: 2016-11-26 | Disposition: A | Discharge status: Home / Self Care | Payer: Medicaid Other | Attending: Emergency Medicine | Admitting: Emergency Medicine

## 2016-11-26 DIAGNOSIS — S6991XA Unspecified injury of right wrist, hand and finger(s), initial encounter: Secondary | ICD-10-CM

## 2016-11-26 DIAGNOSIS — M7989 Other specified soft tissue disorders: Secondary | ICD-10-CM | POA: Diagnosis not present

## 2016-11-26 DIAGNOSIS — F17200 Nicotine dependence, unspecified, uncomplicated: Secondary | ICD-10-CM | POA: Insufficient documentation

## 2016-11-26 DIAGNOSIS — W458XXA Other foreign body or object entering through skin, initial encounter: Secondary | ICD-10-CM | POA: Insufficient documentation

## 2016-11-26 DIAGNOSIS — Y9389 Activity, other specified: Secondary | ICD-10-CM | POA: Insufficient documentation

## 2016-11-26 DIAGNOSIS — Z79899 Other long term (current) drug therapy: Secondary | ICD-10-CM | POA: Insufficient documentation

## 2016-11-26 DIAGNOSIS — F1010 Alcohol abuse, uncomplicated: Secondary | ICD-10-CM

## 2016-11-26 DIAGNOSIS — F909 Attention-deficit hyperactivity disorder, unspecified type: Secondary | ICD-10-CM | POA: Insufficient documentation

## 2016-11-26 DIAGNOSIS — Y999 Unspecified external cause status: Secondary | ICD-10-CM | POA: Diagnosis not present

## 2016-11-26 DIAGNOSIS — Y929 Unspecified place or not applicable: Secondary | ICD-10-CM | POA: Insufficient documentation

## 2016-11-26 DIAGNOSIS — Z4801 Encounter for change or removal of surgical wound dressing: Secondary | ICD-10-CM | POA: Insufficient documentation

## 2016-11-26 DIAGNOSIS — Z4889 Encounter for other specified surgical aftercare: Secondary | ICD-10-CM

## 2016-11-26 DIAGNOSIS — R45851 Suicidal ideations: Secondary | ICD-10-CM

## 2016-11-26 MED ORDER — CEPHALEXIN 500 MG PO CAPS: 500.0000 mg | ORAL_CAPSULE | Freq: Three times a day (TID) | ORAL | 0 refills | Status: DC

## 2016-11-26 MED ORDER — CEPHALEXIN 500 MG PO CAPS
500.0000 mg | ORAL_CAPSULE | Freq: Once | ORAL | Status: AC
  Administered 2016-11-26: 500 mg via ORAL
  Filled 2016-11-26: qty 1

## 2016-11-26 NOTE — Discharge Instructions (Signed)
1. Take antibiotic as prescribed (Keflex 500 mg 3 times daily 7 days). 2. Clean and dry. 3. Return to the ER for worsening symptoms, persistent vomiting, fever, increased redness/swelling, purulent discharge or other concerns.

## 2016-11-26 NOTE — ED Triage Notes (Addendum)
Patient ambulatory to triage with steady gait, without difficulty or distress noted, brought in by EMS; pt reports needing his right hand checked for postop infection & pain; pt with cast in place; fingers W&D, good movem/sensation
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
## 2016-11-26 NOTE — ED Provider Notes (Signed)
[facility] Emergency Department Provider Note   ____________________________________________   First MD Initiated Contact with Patient 11/26/16 [phone]     (approximate)  I have reviewed the triage vital signs and the nursing notes.   HISTORY  Chief Complaint Hand Pain    HPI Alex [name] is a 41 y.o. male brought to the ED from home via EMS with a chief complaint of wound check. Patient had right fifth digit surgery at [facility] on 2/20 and wants his hand checked for postoperative infection. States the "pin wants to come out". Unsure when his postsurgical follow-up visit is scheduled. Denies fever, chills, chest pain, shortness of breath, abdominal pain, nausea, vomiting, diarrhea. Nothing makes his pain better. Movement makes his pain worse.   Past Medical History:  Diagnosis Date  . Alcohol abuse   . Anxiety   . Cocaine abuse 05/27/2016  . Depression     Patient Active Problem List   Diagnosis Date Noted  . Substance induced mood disorder (HCC) 08/11/2016  . ADHD 08/11/2016  . Alcohol use disorder, moderate, dependence (HCC) 06/28/2016  . Alcohol withdrawal (HCC) 06/28/2016  . Cocaine use disorder, moderate, dependence (HCC) 06/28/2016  . Unspecified Depressive disorder 06/28/2016  . Tobacco use disorder 06/28/2016  . Noncompliance 06/26/2016    History reviewed. No pertinent surgical history.  Prior to Admission medications   Medication Sig Start Date End Date Taking? Authorizing Provider  acetaminophen (TYLENOL) 500 MG tablet Take 2 tablets (1,000 mg total) by mouth every 6 (six) hours as needed for mild pain. 07/21/16   Jimmy Footman, MD  atomoxetine (STRATTERA) 40 MG capsule Take 1 capsule (40 mg total) by mouth daily. 06/29/16   Jimmy Footman, MD  cephALEXin (KEFLEX) 500 MG capsule Take 1 capsule (500 mg total) by mouth 3 (three) times daily. 11/26/16   Irean Hong, MD  citalopram (CELEXA) 20 MG tablet Take 1 tablet  (20 mg total) by mouth daily. 06/30/16   Jimmy Footman, MD  ibuprofen (ADVIL,MOTRIN) 200 MG tablet Take 3 tablets (600 mg total) by mouth every 6 (six) hours as needed for moderate pain. 07/21/16   Jimmy Footman, MD  ketorolac (TORADOL) 10 MG tablet Take 1 tablet (10 mg total) by mouth every 6 (six) hours as needed. 11/10/16   Darci Current, MD  omeprazole (PRILOSEC) 20 MG capsule Take 20 mg by mouth 2 (two) times daily before a meal.    Historical Provider, MD  sulfamethoxazole-trimethoprim (BACTRIM DS,SEPTRA DS) 800-160 MG tablet Take 1 tablet by mouth 2 (two) times daily. 11/10/16   Darci Current, MD  traZODone (DESYREL) 150 MG tablet Take by mouth at bedtime.    Historical Provider, MD    Allergies Peanut butter flavor  No family history on file.  Social History Social History  Substance Use Topics  . Smoking status: Current Every Day Smoker    Packs/day: 1.00  . Smokeless tobacco: Never Used  . Alcohol use 6.0 oz/week    10 Cans of beer per week    Review of Systems Constitutional: No fever/chills. Eyes: No visual changes. ENT: No sore throat. Cardiovascular: Denies chest pain. Respiratory: Denies shortness of breath. Gastrointestinal: No abdominal pain.  No nausea, no vomiting.  No diarrhea.  No constipation. Genitourinary: Negative for dysuria. Musculoskeletal: Positive for right fifth digit pain and swelling. Negative for back pain. Skin: Negative for rash. Neurological: Negative for headaches, focal weakness or numbness.  10-point ROS otherwise negative.  ____________________________________________   PHYSICAL EXAM:  VITAL SIGNS: ED Triage Vitals [11/26/16 0111]  Enc Vitals Group     BP 129/88     Pulse Rate 97     Resp 18     Temp 97.7 F (36.5 C)     Temp Source Oral     SpO2 99 %     Weight 200 lb (90.7 kg)     Height 5\' 6"  (1.676 m)     Head Circumference      Peak Flow      Pain Score 10     Pain Loc      Pain Edu?       Excl. in GC?     Constitutional: Alert and oriented. Well appearing and in no acute distress. Eyes: Conjunctivae are normal. PERRL. EOMI. Head: Atraumatic. Nose: No congestion/rhinnorhea. Mouth/Throat: Mucous membranes are moist.  Oropharynx non-erythematous. Neck: No stridor.   Cardiovascular: Normal rate, regular rhythm. Grossly normal heart sounds.  Good peripheral circulation. Respiratory: Normal respiratory effort.  No retractions. Lungs CTAB. Gastrointestinal: Soft and nontender. No distention. No abdominal bruits. No CVA tenderness. Musculoskeletal: Splint removed to examine right fifth digit which has a pin in place. There is mild swelling and erythema without warmth or fluctuance. There is no purulent discharge. 2+ radial pulse. Brisk, less than 5 second capillary refill. Neurologic:  Normal speech and language. No gross focal neurologic deficits are appreciated. No gait instability. Skin:  Skin is warm, dry and intact. No rash noted. Psychiatric: Mood and affect are normal. Speech and behavior are normal.  ____________________________________________   LABS (all labs ordered are listed, but only abnormal results are displayed)  Labs Reviewed - No data to display ____________________________________________  EKG  None ____________________________________________  RADIOLOGY  None ____________________________________________   PROCEDURES  Procedure(s) performed: None  Procedures  Critical Care performed: No  ____________________________________________   INITIAL IMPRESSION / ASSESSMENT AND PLAN / ED COURSE  Pertinent labs & imaging results that were available during my care of the patient were reviewed by me and considered in my medical decision making (see chart for details).  41 year old male who requests wound check status post right fifth digit surgery with pin. Patient was asked several times not to touch the pin or post operative site with his dirty  left hand. Given mild swelling and erythema, will place patient on Keflex. He was strongly encouraged to follow up with his surgeon closely. Strict return precautions given. Patient verbalizes understanding and agrees with plan of care.      ____________________________________________   FINAL CLINICAL IMPRESSION(S) / ED DIAGNOSES  Final diagnoses:  Encounter for post surgical wound check      NEW MEDICATIONS STARTED DURING THIS VISIT:  New Prescriptions   CEPHALEXIN (KEFLEX) 500 MG CAPSULE    Take 1 capsule (500 mg total) by mouth 3 (three) times daily.     Note:  This document was prepared using Dragon voice recognition software and may include unintentional dictation errors.    Irean HongJade J Aunika Kirsten, MD 11/26/16 [phone]

## 2016-11-27 ENCOUNTER — Inpatient Hospital Stay
Admission: AD | Admit: 2016-11-27 | Discharge: 2016-11-29 | DRG: 885 | Disposition: A | Discharge status: Home / Self Care | Payer: No Typology Code available for payment source | Source: Ambulatory Visit | Attending: Psychiatry | Admitting: Psychiatry

## 2016-11-27 DIAGNOSIS — K219 Gastro-esophageal reflux disease without esophagitis: Secondary | ICD-10-CM | POA: Diagnosis present

## 2016-11-27 DIAGNOSIS — F1410 Cocaine abuse, uncomplicated: Secondary | ICD-10-CM

## 2016-11-27 DIAGNOSIS — F10239 Alcohol dependence with withdrawal, unspecified: Secondary | ICD-10-CM | POA: Diagnosis present

## 2016-11-27 DIAGNOSIS — F1029 Alcohol dependence with unspecified alcohol-induced disorder: Secondary | ICD-10-CM | POA: Diagnosis present

## 2016-11-27 DIAGNOSIS — S62622A Displaced fracture of medial phalanx of right middle finger, initial encounter for closed fracture: Secondary | ICD-10-CM | POA: Diagnosis present

## 2016-11-27 DIAGNOSIS — R4584 Anhedonia: Secondary | ICD-10-CM | POA: Diagnosis present

## 2016-11-27 DIAGNOSIS — F909 Attention-deficit hyperactivity disorder, unspecified type: Secondary | ICD-10-CM | POA: Diagnosis present

## 2016-11-27 DIAGNOSIS — S6991XA Unspecified injury of right wrist, hand and finger(s), initial encounter: Secondary | ICD-10-CM | POA: Diagnosis not present

## 2016-11-27 DIAGNOSIS — F329 Major depressive disorder, single episode, unspecified: Secondary | ICD-10-CM | POA: Diagnosis present

## 2016-11-27 DIAGNOSIS — F332 Major depressive disorder, recurrent severe without psychotic features: Principal | ICD-10-CM | POA: Diagnosis present

## 2016-11-27 DIAGNOSIS — F419 Anxiety disorder, unspecified: Secondary | ICD-10-CM | POA: Diagnosis present

## 2016-11-27 DIAGNOSIS — Z91018 Allergy to other foods: Secondary | ICD-10-CM | POA: Diagnosis not present

## 2016-11-27 DIAGNOSIS — Z79899 Other long term (current) drug therapy: Secondary | ICD-10-CM | POA: Diagnosis not present

## 2016-11-27 DIAGNOSIS — F17200 Nicotine dependence, unspecified, uncomplicated: Secondary | ICD-10-CM | POA: Diagnosis present

## 2016-11-27 DIAGNOSIS — R45851 Suicidal ideations: Secondary | ICD-10-CM | POA: Diagnosis present

## 2016-11-27 DIAGNOSIS — F1420 Cocaine dependence, uncomplicated: Secondary | ICD-10-CM | POA: Diagnosis present

## 2016-11-27 DIAGNOSIS — F17210 Nicotine dependence, cigarettes, uncomplicated: Secondary | ICD-10-CM | POA: Diagnosis present

## 2016-11-27 DIAGNOSIS — F10939 Alcohol use, unspecified with withdrawal, unspecified: Secondary | ICD-10-CM | POA: Diagnosis present

## 2016-11-27 DIAGNOSIS — F32A Depression, unspecified: Secondary | ICD-10-CM | POA: Diagnosis present

## 2016-11-27 LAB — COMPREHENSIVE METABOLIC PANEL
ALT: 16 U/L — AB (ref 17–63)
AST: 27 U/L (ref 15–41)
Albumin: 4.9 g/dL (ref 3.5–5.0)
Alkaline Phosphatase: 94 U/L (ref 38–126)
Anion gap: 12 (ref 5–15)
BILIRUBIN TOTAL: 0.9 mg/dL (ref 0.3–1.2)
BUN: 10 mg/dL (ref 6–20)
CALCIUM: 9.1 mg/dL (ref 8.9–10.3)
CO2: 25 mmol/L (ref 22–32)
CREATININE: 0.85 mg/dL (ref 0.61–1.24)
Chloride: 98 mmol/L — ABNORMAL LOW (ref 101–111)
GFR calc Af Amer: >60 mL/min (ref >60)
Glucose, Bld: 75 mg/dL (ref 65–99)
Potassium: 4.3 mmol/L (ref 3.5–5.1)
Sodium: 135 mmol/L (ref 135–145)
TOTAL PROTEIN: 8.3 g/dL — AB (ref 6.5–8.1)

## 2016-11-27 LAB — URINE DRUG SCREEN, QUALITATIVE (ARMC ONLY)
Amphetamines, Ur Screen: NOT DETECTED
BARBITURATES, UR SCREEN: NOT DETECTED
Benzodiazepine, Ur Scrn: NOT DETECTED
CANNABINOID 50 NG, UR ~~LOC~~: NOT DETECTED
COCAINE METABOLITE, UR ~~LOC~~: POSITIVE — AB
MDMA (Ecstasy)Ur Screen: NOT DETECTED
Methadone Scn, Ur: NOT DETECTED
OPIATE, UR SCREEN: NOT DETECTED
Phencyclidine (PCP) Ur S: NOT DETECTED
Tricyclic, Ur Screen: NOT DETECTED

## 2016-11-27 LAB — CBC
HEMATOCRIT: 49.5 % (ref 40.0–52.0)
Hemoglobin: 17.3 g/dL (ref 13.0–18.0)
MCH: 33.3 pg (ref 26.0–34.0)
MCHC: 35 g/dL (ref 32.0–36.0)
MCV: 95.1 fL (ref 80.0–100.0)
Platelets: 211 10*3/uL (ref 150–440)
RBC: 5.2 MIL/uL (ref 4.40–5.90)
RDW: 14.5 % (ref 11.5–14.5)
WBC: 13.3 10*3/uL — ABNORMAL HIGH (ref 3.8–10.6)

## 2016-11-27 LAB — SALICYLATE LEVEL: Salicylate Lvl: <7 mg/dL (ref 2.8–30.0)

## 2016-11-27 LAB — FOLATE: FOLATE: 42 ng/mL (ref >5.9)

## 2016-11-27 LAB — ETHANOL: ALCOHOL ETHYL (B): 215 mg/dL — AB (ref <5)

## 2016-11-27 LAB — ACETAMINOPHEN LEVEL: Acetaminophen (Tylenol), Serum: <10 ug/mL — ABNORMAL LOW (ref 10–30)

## 2016-11-27 MED ORDER — CITALOPRAM HYDROBROMIDE 20 MG PO TABS: 20.0000 mg | ORAL_TABLET | Freq: Every day | ORAL | Status: DC

## 2016-11-27 MED ORDER — ONDANSETRON 4 MG PO TBDP
4.0000 mg | ORAL_TABLET | Freq: Four times a day (QID) | ORAL | Status: DC | PRN
  Filled 2016-11-27: qty 1

## 2016-11-27 MED ORDER — IBUPROFEN 600 MG PO TABS: 600.0000 mg | ORAL_TABLET | Freq: Four times a day (QID) | ORAL | Status: DC | PRN

## 2016-11-27 MED ORDER — ACETAMINOPHEN 500 MG PO TABS: 1000.0000 mg | ORAL_TABLET | Freq: Four times a day (QID) | ORAL | Status: DC | PRN

## 2016-11-27 MED ORDER — TRAZODONE HCL 50 MG PO TABS: 150.0000 mg | ORAL_TABLET | Freq: Every day | ORAL | Status: DC

## 2016-11-27 MED ORDER — LORAZEPAM 1 MG PO TABS: 1.0000 mg | ORAL_TABLET | Freq: Four times a day (QID) | ORAL | Status: DC | PRN

## 2016-11-27 MED ORDER — THIAMINE HCL 100 MG/ML IJ SOLN
100.0000 mg | Freq: Once | INTRAMUSCULAR | Status: DC
  Filled 2016-11-27: qty 2

## 2016-11-27 MED ORDER — PANTOPRAZOLE SODIUM 40 MG PO TBEC
40.0000 mg | DELAYED_RELEASE_TABLET | Freq: Every day | ORAL | Status: DC
  Administered 2016-11-27 – 2016-11-29 (×3): 40 mg via ORAL
  Filled 2016-11-27 (×3): qty 1

## 2016-11-27 MED ORDER — PANTOPRAZOLE SODIUM 40 MG PO TBEC: 40.0000 mg | DELAYED_RELEASE_TABLET | Freq: Every day | ORAL | Status: DC

## 2016-11-27 MED ORDER — HYDROXYZINE HCL 25 MG PO TABS
25.0000 mg | ORAL_TABLET | Freq: Four times a day (QID) | ORAL | Status: DC | PRN
  Administered 2016-11-27: 25 mg via ORAL
  Filled 2016-11-27: qty 1

## 2016-11-27 MED ORDER — CEPHALEXIN 500 MG PO CAPS
500.0000 mg | ORAL_CAPSULE | Freq: Three times a day (TID) | ORAL | Status: DC
  Administered 2016-11-27: 500 mg via ORAL
  Filled 2016-11-27: qty 1

## 2016-11-27 MED ORDER — MAGNESIUM HYDROXIDE 400 MG/5ML PO SUSP: 30.0000 mL | Freq: Every day | ORAL | Status: DC | PRN

## 2016-11-27 MED ORDER — OCUVITE-LUTEIN PO CAPS
1.0000 | ORAL_CAPSULE | Freq: Every day | ORAL | Status: DC
  Administered 2016-11-27 – 2016-11-29 (×3): 1 via ORAL
  Filled 2016-11-27 (×3): qty 1

## 2016-11-27 MED ORDER — TRAMADOL HCL 50 MG PO TABS
50.0000 mg | ORAL_TABLET | Freq: Once | ORAL | Status: AC
  Administered 2016-11-27: 50 mg via ORAL
  Filled 2016-11-27: qty 1

## 2016-11-27 MED ORDER — ATOMOXETINE HCL 10 MG PO CAPS
40.0000 mg | ORAL_CAPSULE | Freq: Every day | ORAL | Status: DC
  Administered 2016-11-27 – 2016-11-29 (×3): 40 mg via ORAL
  Filled 2016-11-27: qty 1
  Filled 2016-11-27 (×2): qty 4

## 2016-11-27 MED ORDER — ALUM & MAG HYDROXIDE-SIMETH 200-200-20 MG/5ML PO SUSP: 30.0000 mL | ORAL | Status: DC | PRN

## 2016-11-27 MED ORDER — VENLAFAXINE HCL ER 75 MG PO CP24
75.0000 mg | ORAL_CAPSULE | Freq: Every day | ORAL | Status: DC
  Administered 2016-11-28 – 2016-11-29 (×2): 75 mg via ORAL
  Filled 2016-11-27 (×2): qty 1

## 2016-11-27 MED ORDER — NICOTINE 14 MG/24HR TD PT24
14.0000 mg | MEDICATED_PATCH | Freq: Every day | TRANSDERMAL | Status: DC
  Administered 2016-11-27 – 2016-11-29 (×3): 14 mg via TRANSDERMAL
  Filled 2016-11-27 (×3): qty 1

## 2016-11-27 MED ORDER — ATOMOXETINE HCL 40 MG PO CAPS: 40.0000 mg | ORAL_CAPSULE | Freq: Every day | ORAL | Status: DC

## 2016-11-27 MED ORDER — ADULT MULTIVITAMIN W/MINERALS CH
1.0000 | ORAL_TABLET | Freq: Every day | ORAL | Status: DC
  Administered 2016-11-27 – 2016-11-29 (×3): 1 via ORAL
  Filled 2016-11-27 (×3): qty 1

## 2016-11-27 MED ORDER — LOPERAMIDE HCL 2 MG PO CAPS: 2.0000 mg | ORAL_CAPSULE | ORAL | Status: DC | PRN

## 2016-11-27 MED ORDER — ACETAMINOPHEN 325 MG PO TABS
650.0000 mg | ORAL_TABLET | Freq: Four times a day (QID) | ORAL | Status: DC | PRN
Start: 2016-11-27 — End: 2016-11-29
  Administered 2016-11-27: 650 mg via ORAL
  Filled 2016-11-27: qty 2

## 2016-11-27 MED ORDER — CEPHALEXIN 500 MG PO CAPS
500.0000 mg | ORAL_CAPSULE | Freq: Four times a day (QID) | ORAL | Status: DC
  Administered 2016-11-27 – 2016-11-29 (×9): 500 mg via ORAL
  Filled 2016-11-27 (×9): qty 1

## 2016-11-27 MED ORDER — TRAZODONE HCL 50 MG PO TABS
150.0000 mg | ORAL_TABLET | Freq: Every day | ORAL | Status: DC
  Administered 2016-11-27 – 2016-11-28 (×2): 150 mg via ORAL
  Filled 2016-11-27 (×2): qty 1

## 2016-11-27 MED ORDER — VITAMIN B-1 100 MG PO TABS
100.0000 mg | ORAL_TABLET | Freq: Every day | ORAL | Status: DC
  Administered 2016-11-28 – 2016-11-29 (×2): 100 mg via ORAL
  Filled 2016-11-27 (×2): qty 1
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
## 2016-11-27 NOTE — Progress Notes (Signed)
Patient seen by  Dr. Joice LoftsPoggi  For his  finger
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
## 2016-11-27 NOTE — ED Provider Notes (Addendum)
  Clinical Course as of Nov 28 622  Sat Nov 27, 2016  0617 Reviewed telepsych note.  Recommended inpatient psychiatric treatment and feels patient meets criteria for IVC.  Ordered meds as per written recommendations.  [CF]    Clinical Course User Index [CF] Loleta Roseory Makella Buckingham, MD       ----------------------------------------- 5:33 AM on 11/27/2016 -----------------------------------------   Blood pressure 133/81, pulse 90, temperature 98.5 F (36.9 C), temperature source Oral, resp. rate 18, SpO2 98 %.  The patient had no acute events since last update.  Calm and cooperative at this time.  Telepsych consult is pending.        Loleta Roseory Sejal Cofield, MD 11/27/16 [phone]
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
## 2016-11-27 NOTE — Progress Notes (Signed)
Patient pleasant and cooperative during admission assessment. Patient denies SI/HI,AVH and withdrawals symptoms   at this time.  Patient informed of fall risk status, fall risk assessed "low" at this time. Patient oriented to unit/staff/room. Patient denies any questions/concerns at this time. Patient safe on unit with Q15 minute checks for safety.
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
## 2016-11-27 NOTE — ED Notes (Signed)
DR Henderson CloudHorvath, [facility]

## 2016-11-27 NOTE — [facility] Group Notes (Signed)
[facility] LCSW Group Therapy  11/27/2016 2:40 PM  Type of Therapy:  Group Therapy  Participation Level:  Patient did not attend group. CSW invited patient to group.   Summary of Progress/Problems: Communications: Patients identify how individuals communicate with one another appropriately and inappropriately. Patients will be guided to discuss their thoughts, feelings, and behaviors related to barriers when communicating. The group will process together ways to execute positive and appropriate communications.   Raheel Kunkle G. Garnette CzechSampson MSW, LCSWA 11/27/2016, 2:41 PM

## 2016-11-27 NOTE — BH Assessment (Signed)
[facility]. [facility] Dr[facility] Daleen Boavi.  Attending Physician will be Dr. Ardyth HarpsHernandez.   Patient [facility] Lillette BoxerGwen F.   Intake Paper Work has been signed and placed on patient chart.  ER staff is aware of the admission [facility](Tracey, ER Sect.; Dr. Pershing ProudSchaevitz, ER MD; Aundra MilletMegan, Patient's Nurse & Nedra HaiLee, Patient Access).

## 2016-11-27 NOTE — ED Notes (Addendum)
Pt states to this nurse "I have intentions of harming myself." Pt denies intentions to harm others. MD and charge RN  notified of pt's statements.
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
## 2016-11-27 NOTE — [facility] Group Notes (Signed)
[facility] Group Notes:  (Nursing/MHT/Case Management/Adjunct)  Date:  11/27/2016  Time:  11:03 PM  Type of Therapy:  Psychoeducational Skills  Participation Level:  Active  Participation Quality:  Appropriate  Affect:  Appropriate  Cognitive:  Appropriate  Insight:  Appropriate  Engagement in Group:  Engaged  Modes of Intervention:  Discussion, Socialization and Support  Summary of Progress/Problems:  Alex [name] 11/27/2016, 11:03 PM

## 2016-11-27 NOTE — Tx Team (Signed)
Initial Treatment Plan 11/27/2016 11:23 AM Alex [name] [id number]    PATIENT STRESSORS: Marital or family conflict Medication change or noncompliance Substance abuse   PATIENT STRENGTHS: Average or above average intelligence Capable of independent living Communication skills   PATIENT IDENTIFIED PROBLEMS: Alcohol/drug  Abuse 3/3 2018  Depression 3/3/ 2018                   DISCHARGE CRITERIA:  Ability to meet basic life and health needs Adequate post-discharge living arrangements Withdrawal symptoms are absent or subacute and managed without 24-hour nursing intervention  PRELIMINARY DISCHARGE PLAN: Attend aftercare/continuing care group Return to previous living arrangement  PATIENT/FAMILY INVOLVEMENT: This treatment plan has been presented to and reviewed with the patient, Alex [name], and/or family member,   The patient and family have been given the opportunity to ask questions and make suggestions.  Leonarda SalonGigi George Shiane Wenberg, RN 11/27/2016, 11:23 AM

## 2016-11-28 LAB — VITAMIN B12: Vitamin B-12: 374 pg/mL (ref 180–914)

## 2016-11-28 MED ORDER — TRAMADOL HCL 50 MG PO TABS
50.0000 mg | ORAL_TABLET | Freq: Two times a day (BID) | ORAL | Status: DC
  Administered 2016-11-28 – 2016-11-29 (×3): 50 mg via ORAL
  Filled 2016-11-28 (×3): qty 1

## 2016-11-28 MED ORDER — IBUPROFEN 600 MG PO TABS: 600.0000 mg | ORAL_TABLET | Freq: Four times a day (QID) | ORAL | Status: DC | PRN

## 2016-11-28 NOTE — [facility] Group Notes (Signed)
[facility] LCSW Group Therapy  11/28/2016 2:09 PM  Type of Therapy:  Group Therapy  Participation Level:  None  Participation Quality:  Inattentive  Affect:  Irritable  Cognitive:  Alert  Insight:  None  Engagement in Therapy:  None  Modes of Intervention:  Activity, Discussion, Education, Problem-solving, Reality Testing and Support  Summary of Progress/Problems: Coping Skills: Patients defined and discussed healthy coping skills. Patients identified healthy coping skills they would like to try during hospitalization and after discharge. CSW offered insight to varying coping skills that may have been new to patients such as practicing mindfulness. Patient arrived late to group. CSW attempted to engage patient in the group discussion. Patient became irritable stating "I'll be fine if I could get a new damn nurse!" CSW provided support to patient. Patient remain silent for the remainder of group.   Muath Hallam G. Garnette CzechSampson MSW, LCSWA 11/28/2016, 2:13 PM

## 2016-11-28 NOTE — [facility] Suicide Risk Assessment (Signed)
[facility] INPATIENT:  Family/Significant Other Suicide Prevention Education  Suicide Prevention Education:  Patient Refusal for Family/Significant Other Suicide Prevention Education: The patient Alex [name] has refused to provide written consent for family/significant other to be provided Family/Significant Other Suicide Prevention Education during admission and/or prior to discharge.  Physician notified.  Delrick Dehart G. Garnette CzechSampson MSW, LCSWA 11/28/2016, 3:14 PM
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
## 2016-11-28 NOTE — Progress Notes (Addendum)
[facility] MD Progress Note  11/28/2016 9:13 AM Alex [name]  MRN:  [id number]   Subjective:   The patient reports that he slept fairly well last night and nursing reported over 6 hours of sleep. He reports feeling a little better today but is struggling with some mild withdrawal symptoms including diarrhea and some mild tremors. He has not gotten any Ativan. He denies any psychosis or DTs. The patient is still having some ongoing pain in the fifth digit of his right hand. He says his mood is "a little better" and he does not feel like the alcohol helped and interacting with his brother. He continues to remain a little angry at his brother and time spent discussing anger management. He denies any current active suicidal thoughts but has had some in intermittent passive suicidal thoughts yesterday evening. He denies any auditory or visual hallucinations. No paranoid thoughts or delusions.  He did attend an AA last night. At this time, he is not sure if he wants to return to any residential substance abuse treatment. Time spent discussing the negative consequences of substance use on health.  Supportive psychotherapy provided and Times spent discussing management. Times spent discussing the "stop, think, and listen" technique and ways to help improve coping skills when feeling anxious or in distress. He was also encouraged to use some mindfulness skills when feeling anxious.  Past Psychiatric History The patient has been hospitalized on inpatient psychiatry in the past. His last hospitalization was at [facility] in October 2017. He had a history of an overdose at the age of 43. He also went to residential substance abuse treatment at [facility] in the past. He has primarily been on Celexa as an outpatient as well as Strattera. The patient says he gets his psychotropic medications from his PCP, Dr. Quillian Quince.   Past Medical History GERD Right hand surgery and placement He denies any history of  any prior TBI or seizures   Family Psychiatric History: The patient reports that his father is an alcoholic   Social History:  The patient says he was born and raised in [place] by his mother as his father died at a very young age. He denies any history of any physical or sexual abuse. He has a 12th grade education. He is currently single and has never married. He says he is not in a relationship. He does not have any children and lives with his brother and his mother. 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  Substance abuse history: The patient has been drinking alcohol heavily on and off for over 15 years. He currently drinks two 40 ounce beers per night 6-7 days per week. He also uses cocaine 3 or 4 times per week. He denies any IV drug use. He denies any marijuana, opiate or stimulant use. He does smoke one pack of cigarettes per day and has been smoking since his teens.  Legal History The patient reports being arrested multiple times in the past for drug charges. He denies any current pending charges.    Principal Problem: Major Depression Diagnosis:   Patient Active Problem List   Diagnosis Date Noted  . Major depressive disorder, recurrent episode, severe (HCC) [F33.2] 11/27/2016  . Cocaine abuse [F14.10]   . Substance induced mood disorder (HCC) [F19.94] 08/11/2016  . ADHD [F90.9] 08/11/2016  . Alcohol dependence with unspecified alcohol-induced disorder (HCC) [F10.29] 06/28/2016  . Alcohol withdrawal (HCC) [F10.239] 06/28/2016  .  Cocaine use disorder, moderate, dependence (HCC) [F14.20] 06/28/2016  . Unspecified Depressive disorder [F32.9] 06/28/2016  . Tobacco use disorder [F17.200] 06/28/2016  . Noncompliance [Z91.19] 06/26/2016   Total Time spent with patient: 20 minutes   Past Medical History:  Past Medical History:  Diagnosis Date  . Alcohol abuse   . Anxiety   . Cocaine abuse 05/27/2016  . Depression      Past Surgical History:  Procedure Laterality Date  . FINGER SURGERY      Social History:  History  Alcohol Use  . 6.0 oz/week  . 10 Cans of beer per week     History  Drug Use  . Types: Marijuana, Cocaine    Social History   Social History  . Marital status: Single    Spouse name: N/A  . Number of children: N/A  . Years of education: N/A   Social History Main Topics  . Smoking status: Current Every Day Smoker    Packs/day: 1.00  . Smokeless tobacco: Never Used  . Alcohol use 6.0 oz/week    10 Cans of beer per week  . Drug use: Yes    Types: Marijuana, Cocaine  . Sexual activity: Yes    Birth control/ protection: None   Other Topics Concern  . None   Social History Narrative  . None      Sleep: Good  Appetite:  Good  Current Medications: Current Facility-Administered Medications  Medication Dose Route Frequency Provider Last Rate Last Dose  . acetaminophen (TYLENOL) tablet 650 mg  650 mg Oral Q6H PRN Alex Ridgel, MD   650 mg at 11/27/16 1835  . alum & mag hydroxide-simeth (MAALOX/MYLANTA) 200-200-20 MG/5ML suspension 30 mL  30 mL Oral Q4H PRN Alex Ridgel, MD      . atomoxetine (STRATTERA) capsule 40 mg  40 mg Oral Daily Alex Ridgel, MD   40 mg at 11/28/16 0826  . cephALEXin (KEFLEX) capsule 500 mg  500 mg Oral Q6H Alex Ridgel, MD   500 mg at 11/28/16 0647  . hydrOXYzine (ATARAX/VISTARIL) tablet 25 mg  25 mg Oral Q6H PRN Alex Ridgel, MD   25 mg at 11/27/16 1835  . ibuprofen (ADVIL,MOTRIN) tablet 600 mg  600 mg Oral Q6H PRN Alex Ridgel, MD      . loperamide (IMODIUM) capsule 2-4 mg  2-4 mg Oral PRN Alex Ridgel, MD      . LORazepam (ATIVAN) tablet 1 mg  1 mg Oral Q6H PRN Alex Ridgel, MD      . magnesium hydroxide (MILK OF MAGNESIA) suspension 30 mL  30 mL Oral Daily PRN Alex Ridgel, MD      . multivitamin with minerals tablet 1 tablet  1 tablet Oral Daily Alex Ridgel, MD   1 tablet at 11/28/16 [phone]  . multivitamin-lutein  (OCUVITE-LUTEIN) capsule 1 capsule  1 capsule Oral Daily Alex Ridgel, MD   1 capsule at 11/28/16 0824  . nicotine (NICODERM CQ - dosed in mg/24 hours) patch 14 mg  14 mg Transdermal Daily Alex Ridgel, MD   14 mg at 11/28/16 0824  . ondansetron (ZOFRAN-ODT) disintegrating tablet 4 mg  4 mg Oral Q6H PRN Alex Ridgel, MD      . pantoprazole (PROTONIX) EC tablet 40 mg  40 mg Oral Daily Alex Ridgel, MD   40 mg at 11/28/16 0824  . thiamine (B-1) injection 100 mg  100 mg Intramuscular Once Alex Ridgel,  MD      . thiamine (VITAMIN B-1) tablet 100 mg  100 mg Oral Daily Alex Ridgel, MD   100 mg at 11/28/16 0824  . traMADol (ULTRAM) tablet 50 mg  50 mg Oral Q12H Alex Ridgel, MD   50 mg at 11/28/16 0826  . traZODone (DESYREL) tablet 150 mg  150 mg Oral QHS Alex Ridgel, MD   150 mg at 11/27/16 2227  . venlafaxine XR (EFFEXOR-XR) 24 hr capsule 75 mg  75 mg Oral Q breakfast Alex Ridgel, MD   75 mg at 11/28/16 2956    Lab Results:  Results for orders placed or performed during the hospital encounter of 11/27/16 (from the past 48 hour(s))  Folate     Status: None   Collection Time: 11/27/16  2:47 PM  Result Value Ref Range   Folate 42.0 >5.9 ng/mL    Blood Alcohol level:  Lab Results  Component Value Date   ETH 215 (H) 11/27/2016   ETH 292 (H) 08/10/2016    Metabolic Disorder Labs: No results found for: HGBA1C, MPG No results found for: PROLACTIN No results found for: CHOL, TRIG, HDL, CHOLHDL, VLDL, LDLCALC  Physical Findings: CIWA:  CIWA-Ar Total: 4  Musculoskeletal: Strength & Muscle Tone: within normal limits Gait & Station: normal Patient leans: N/A  Psychiatric Specialty Exam: Physical Exam  Review of Systems  Constitutional: Negative.   HENT: Negative.   Eyes: Negative.   Respiratory: Negative.   Cardiovascular: Negative.   Gastrointestinal: Negative.   Genitourinary: Negative.   Musculoskeletal:       The patient does have pain in the fifth digit of his right  hand. The patient is sticking out of the fifth digit and there does appear to be some inflammation and swelling.  Skin: Negative.   Neurological: Negative.   Endo/Heme/Allergies: Negative.     Blood pressure 116/72, pulse 70, temperature 97.7 F (36.5 C), temperature source Oral, resp. rate 18, height 5\' 2"  (1.575 m), weight 81.6 kg (180 lb), SpO2 99 %.Body mass index is 32.92 kg/m.  General Appearance: Casual  Eye Contact:  Good  Speech:  Clear and Coherent and Normal Rate  Volume:  Normal  Mood:  "I feel better"  Affect:  Incongruent; cheerful  Thought Process:  Coherent, Goal Directed and Linear  Orientation:  Full (Time, Place, and Person)  Thought Content:  Logical  Suicidal Thoughts:  Yes.  without intent/plan  Homicidal Thoughts:  No  Memory:  Immediate;   Fair Recent;   Fair Remote;   Fair  Judgement:  Impaired  Insight:  Fair  Psychomotor Activity:  Normal  Concentration:  Concentration: Fair and Attention Span: Fair  Recall:  [employer] of Knowledge:  Good  Language:  Good  Akathisia:  No  Handed:  Right  AIMS (if indicated):     Assets:  Communication Skills Physical Health  ADL's:  Intact  Cognition:  WNL  Sleep:  Number of Hours: 6.45     Treatment Plan Summary:  Major depressive disorder, recurrent, moderate to severe without psychotic features ADHD Alcohol use disorder, severe Cocaine use disorder, severe Tobacco use disorder   Mr. Cuttino is a 40 year old single Caucasian male with prior diagnosis of recurrent depression and alcohol and cocaine use disorders. He was admitted to inpatient psychiatry after endorsing suicidal thoughts in the emergency room. He denies any specific plan. The patient will be admitted to inpatient psychiatry for medication management, safety and stabilization.  Major depressive  disorder, recurrent, moderate to severe without psychotic features: The patient was started on Effexor XR 75 mg p.o. Daily for anxiety depression.  He also has trazodone 150 mg p.o.nightly for insomnia.  He has failed multiple antidepressants in the past and did not want torestart the Celexa. He also has hydroxyzine p.r.n. For anxiety.  ADHD: The patient will not be placed on a stimulant drug especially due to cocaine use. He will continue on Straterra  Cocaine use disorder, severe, Alcohol use disorder, severe: The patient will be started on Ativan per CIWA as well as being given multivitamin, thiamine and folic acid for alcohol detox. The patient has been to residential substance abuse treatment in the past but would benefit from going again. We'll try to encourage the patient to enter a meaningful recovery program at the time of discharge for substance abuse treatment.  Nicotine Use Disorder: The patient the patient will be offered a nicotine patch. Surgery of right hand with pin placement: The patient has a pin extruding from the fifth digit of the right hand. Orthopedics has been consulted to evaluate. X-ray in the emergency room did not show any new fractures. Will give Keflex 500 mg by mouth 4 times a day per Dr. Joice LoftsPoggi from Orthopedics.  GERD: The patient was restarted on omeprazole 40 mg by mouth daily  Disposition: Will try to encourage the patient to enter a meaningful recovery program at the time of discharge for substance abuse treatment. He will need psychotropic medication management. At this time, he does have a stable living situation with his brother but there is significant conflict with his brother.   Daily contact with patient to assess and evaluate symptoms and progress in treatment and Medication management    Levora AngelKAPUR,Tyshika Baldridge KAMAL, MD 11/28/2016, 9:13 AM

## 2016-11-28 NOTE — [facility] Group Notes (Signed)
[facility] Group Notes:  (Nursing/MHT/Case Management/Adjunct)  Date:  11/28/2016  Time:  10:44 PM  Type of Therapy:  Evening Wrap-up Group  Participation Level:  Active  Participation Quality:  Appropriate and Attentive  Affect:  Appropriate  Cognitive:  Alert and Appropriate  Insight:  Appropriate and Good  Engagement in Group:  Engaged  Modes of Intervention:  Discussion  Summary of Progress/Problems:  Tomasita MorrowChelsea Nanta Brody Kump 11/28/2016, 10:44 PM
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
## 2016-11-29 DIAGNOSIS — F329 Major depressive disorder, single episode, unspecified: Secondary | ICD-10-CM

## 2016-11-29 MED ORDER — CEPHALEXIN 500 MG PO CAPS: 500.0000 mg | ORAL_CAPSULE | Freq: Four times a day (QID) | ORAL | 0 refills | Status: AC

## 2016-11-29 MED ORDER — VALPROIC ACID 250 MG PO CAPS: 750.0000 mg | ORAL_CAPSULE | Freq: Every day | ORAL | 0 refills | Status: DC
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
## 2016-11-29 NOTE — Progress Notes (Signed)
  [facility] :  Will you be returning to the same living situation after discharge:  Yes,    At discharge, do you have transportation home?: No., Pt provided taxi home as no buses or family available to [facility] Do you have the ability to pay for your medications: Yes,   insurance  Release of information consent forms completed and in the chart;  Patient's signature needed at discharge.  Patient to Follow up at: Follow-up Information    [facility]. Go on 12/01/2016.   Why:  2:30pm, Please arrive on time and if for some reason you are unable to make this appointment please reschedule as soon as possible. Contact information: [address] [place] [id number] [phone]           Next level of care provider has access to [facility] Link:no  Safety Planning and Suicide Prevention discussed: Yes,     Have you used any form of tobacco in the last 30 days? (Cigarettes, Smokeless Tobacco, Cigars, and/or Pipes): Yes  Has patient been referred to the Quitline?: Patient refused referral  Patient has been referred for addiction treatment: Yes  Glennon MacSara P Valinda Fedie, MSW, LCSW 11/29/2016, 1:19 PM
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
## 2016-11-29 NOTE — Plan of Care (Signed)
Problem: Pain Managment: Goal: General experience of comfort will improve Outcome: Progressing Encouraged to report pain and ask for interventions appropriately.
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
## 2016-11-29 NOTE — Tx Team (Signed)
Interdisciplinary Treatment and Diagnostic Plan Update  11/29/2016 Time of Session: 10:30am Smiley HousemanBrian J Tokarski MRN: [id number]  Principal Diagnosis: Depressive disorder  Secondary Diagnoses: Principal Problem:   Unspecified Depressive disorder Active Problems:   Alcohol dependence with unspecified alcohol-induced disorder (HCC)   Alcohol withdrawal (HCC)   Cocaine use disorder, moderate, dependence (HCC)   Tobacco use disorder   ADHD   Cocaine abuse   Current Medications:  Current Facility-Administered Medications  Medication Dose Route Frequency Provider Last Rate Last Dose  . acetaminophen (TYLENOL) tablet 650 mg  650 mg Oral Q6H PRN Darliss RidgelAarti K Kapur, MD   650 mg at 11/27/16 1835  . alum & mag hydroxide-simeth (MAALOX/MYLANTA) 200-200-20 MG/5ML suspension 30 mL  30 mL Oral Q4H PRN Darliss RidgelAarti K Kapur, MD      . atomoxetine (STRATTERA) capsule 40 mg  40 mg Oral Daily Darliss RidgelAarti K Kapur, MD   40 mg at 11/29/16 0845  . cephALEXin (KEFLEX) capsule 500 mg  500 mg Oral Q6H Darliss RidgelAarti K Kapur, MD   500 mg at 11/29/16 1234  . hydrOXYzine (ATARAX/VISTARIL) tablet 25 mg  25 mg Oral Q6H PRN Darliss RidgelAarti K Kapur, MD   25 mg at 11/27/16 1835  . ibuprofen (ADVIL,MOTRIN) tablet 600 mg  600 mg Oral Q6H PRN Darliss RidgelAarti K Kapur, MD      . loperamide (IMODIUM) capsule 2-4 mg  2-4 mg Oral PRN Darliss RidgelAarti K Kapur, MD      . LORazepam (ATIVAN) tablet 1 mg  1 mg Oral Q6H PRN Darliss RidgelAarti K Kapur, MD      . magnesium hydroxide (MILK OF MAGNESIA) suspension 30 mL  30 mL Oral Daily PRN Darliss RidgelAarti K Kapur, MD      . multivitamin with minerals tablet 1 tablet  1 tablet Oral Daily Darliss RidgelAarti K Kapur, MD   1 tablet at 11/29/16 0846  . multivitamin-lutein (OCUVITE-LUTEIN) capsule 1 capsule  1 capsule Oral Daily Darliss RidgelAarti K Kapur, MD   1 capsule at 11/29/16 0845  . nicotine (NICODERM CQ - dosed in mg/24 hours) patch 14 mg  14 mg Transdermal Daily Darliss RidgelAarti K Kapur, MD   14 mg at 11/29/16 0846  . ondansetron (ZOFRAN-ODT) disintegrating tablet 4 mg  4 mg Oral Q6H PRN Darliss RidgelAarti K Kapur,  MD      . pantoprazole (PROTONIX) EC tablet 40 mg  40 mg Oral Daily Darliss RidgelAarti K Kapur, MD   40 mg at 11/29/16 0859  . thiamine (B-1) injection 100 mg  100 mg Intramuscular Once Darliss RidgelAarti K Kapur, MD      . thiamine (VITAMIN B-1) tablet 100 mg  100 mg Oral Daily Darliss RidgelAarti K Kapur, MD   100 mg at 11/29/16 0845  . traMADol (ULTRAM) tablet 50 mg  50 mg Oral Q12H Darliss RidgelAarti K Kapur, MD   50 mg at 11/29/16 0859  . traZODone (DESYREL) tablet 150 mg  150 mg Oral QHS Darliss RidgelAarti K Kapur, MD   150 mg at 11/28/16 2211  . venlafaxine XR (EFFEXOR-XR) 24 hr capsule 75 mg  75 mg Oral Q breakfast Darliss RidgelAarti K Kapur, MD   75 mg at 11/29/16 0845   PTA Medications: Prescriptions Prior to Admission  Medication Sig Dispense Refill Last Dose  . acetaminophen (TYLENOL) 500 MG tablet Take 2 tablets (1,000 mg total) by mouth every 6 (six) hours as needed for mild pain. (Patient not taking: Reported on 11/27/2016) 30 tablet 0 Not Taking at Unknown time  . atomoxetine (STRATTERA) 40 MG capsule Take 1 capsule (40 mg total) by mouth  daily. (Patient not taking: Reported on 11/27/2016) 30 capsule 0 Not Taking at Unknown time  . cephALEXin (KEFLEX) 500 MG capsule Take 1 capsule (500 mg total) by mouth 3 (three) times daily. 21 capsule 0   . citalopram (CELEXA) 20 MG tablet Take 1 tablet (20 mg total) by mouth daily. (Patient not taking: Reported on 11/27/2016) 30 tablet 0 Not Taking at Unknown time  . ibuprofen (ADVIL,MOTRIN) 200 MG tablet Take 3 tablets (600 mg total) by mouth every 6 (six) hours as needed for moderate pain. (Patient not taking: Reported on 11/27/2016)   Not Taking at Unknown time  . ketorolac (TORADOL) 10 MG tablet Take 1 tablet (10 mg total) by mouth every 6 (six) hours as needed. 20 tablet 0 Past Month at Unknown time  . omeprazole (PRILOSEC) 20 MG capsule Take 20 mg by mouth 2 (two) times daily before a meal.   Not Taking at Unknown time  . oxyCODONE (OXY IR/ROXICODONE) 5 MG immediate release tablet Take 1-2 tablets by mouth every 3 (three)  hours as needed.  0 Past Week at Unknown time  . sulfamethoxazole-trimethoprim (BACTRIM DS,SEPTRA DS) 800-160 MG tablet Take 1 tablet by mouth 2 (two) times daily. (Patient not taking: Reported on 11/27/2016) 20 tablet 0 Completed Course at Unknown time  . traZODone (DESYREL) 150 MG tablet Take by mouth at bedtime.   Not Taking at Unknown time    Patient Stressors: Marital or family conflict Medication change or noncompliance Substance abuse  Patient Strengths: Average or above average intelligence Capable of independent living Communication skills  Treatment Modalities: Medication Management, Group therapy, Case management,  1 to 1 session with clinician, Psychoeducation, Recreational therapy.   Physician Treatment Plan for Primary Diagnosis: Depressive disorder Long Term Goal(s): Improvement in symptoms so as ready for discharge Improvement in symptoms so as ready for discharge   Short Term Goals: Ability to verbalize feelings will improve Ability to disclose and discuss suicidal ideas Ability to demonstrate self-control will improve Ability to identify and develop effective coping behaviors will improve Compliance with prescribed medications will improve Ability to identify triggers associated with substance abuse/mental health issues will improve Ability to identify changes in lifestyle to reduce recurrence of condition will improve Ability to disclose and discuss suicidal ideas Ability to identify and develop effective coping behaviors will improve Ability to maintain clinical measurements within normal limits will improve Compliance with prescribed medications will improve Ability to identify triggers associated with substance abuse/mental health issues will improve  Medication Management: Evaluate patient's response, side effects, and tolerance of medication regimen.  Therapeutic Interventions: 1 to 1 sessions, Unit Group sessions and Medication administration.  Evaluation  of Outcomes: Progressing  Physician Treatment Plan for Secondary Diagnosis: Principal Problem:   Unspecified Depressive disorder Active Problems:   Alcohol dependence with unspecified alcohol-induced disorder (HCC)   Alcohol withdrawal (HCC)   Cocaine use disorder, moderate, dependence (HCC)   Tobacco use disorder   ADHD   Cocaine abuse  Long Term Goal(s): Improvement in symptoms so as ready for discharge Improvement in symptoms so as ready for discharge   Short Term Goals: Ability to verbalize feelings will improve Ability to disclose and discuss suicidal ideas Ability to demonstrate self-control will improve Ability to identify and develop effective coping behaviors will improve Compliance with prescribed medications will improve Ability to identify triggers associated with substance abuse/mental health issues will improve Ability to identify changes in lifestyle to reduce recurrence of condition will improve Ability to disclose and discuss suicidal  ideas Ability to identify and develop effective coping behaviors will improve Ability to maintain clinical measurements within normal limits will improve Compliance with prescribed medications will improve Ability to identify triggers associated with substance abuse/mental health issues will improve     Medication Management: Evaluate patient's response, side effects, and tolerance of medication regimen.  Therapeutic Interventions: 1 to 1 sessions, Unit Group sessions and Medication administration.  Evaluation of Outcomes: Progressing   RN Treatment Plan for Primary Diagnosis: Depressive disorder Long Term Goal(s): Knowledge of disease and therapeutic regimen to maintain health will improve  Short Term Goals: Ability to verbalize frustration and anger appropriately will improve, Ability to demonstrate self-control, Ability to verbalize feelings will improve and Ability to identify and develop effective coping behaviors will  improve  Medication Management: RN will administer medications as ordered by provider, will assess and evaluate patient's response and provide education to patient for prescribed medication. RN will report any adverse and/or side effects to prescribing provider.  Therapeutic Interventions: 1 on 1 counseling sessions, Psychoeducation, Medication administration, Evaluate responses to treatment, Monitor vital signs and CBGs as ordered, Perform/monitor CIWA, COWS, AIMS and Fall Risk screenings as ordered, Perform wound care treatments as ordered.  Evaluation of Outcomes: Progressing   LCSW Treatment Plan for Primary Diagnosis: Depressive disorder Long Term Goal(s): Safe transition to appropriate next level of care at discharge, Engage patient in therapeutic group addressing interpersonal concerns.  Short Term Goals: Engage patient in aftercare planning with referrals and resources, Increase emotional regulation, Identify triggers associated with mental health/substance abuse issues and Increase skills for wellness and recovery  Therapeutic Interventions: Assess for all discharge needs, 1 to 1 time with Social worker, Explore available resources and support systems, Assess for adequacy in community support network, Educate family and significant other(s) on suicide prevention, Complete Psychosocial Assessment, Interpersonal group therapy.  Evaluation of Outcomes: Progressing   Progress in Treatment: Attending groups: Yes. Participating in groups: Yes. Taking medication as prescribed: Yes. Toleration medication: Yes. Family/Significant other contact made: No, will contact:  Pt declined family contact Patient understands diagnosis: Yes. Discussing patient identified problems/goals with staff: Yes. Medical problems stabilized or resolved: Yes. Denies suicidal/homicidal ideation: Yes. Issues/concerns per patient self-inventory: No. Other:    New problem(s) identified: No, Describe:     New  Short Term/Long Term Goal(s):  Discharge Plan or Barriers: discharge home with mother and follow up with [facility]  Reason for Continuation of Hospitalization: None, D/C today  Estimated Length of Stay: 0 days  Attendees: Patient:Alex [name] 11/29/2016 1:15 PM  Physician: Ardyth Harps 11/29/2016 1:15 PM  Nursing: Nile Riggs, RN 11/29/2016 1:15 PM  RN Care Manager: 11/29/2016 1:15 PM  Social Worker: Jake Shark, LCSW 11/29/2016 1:15 PM  Recreational Therapist:  11/29/2016 1:15 PM  Other:  11/29/2016 1:15 PM  Other:  11/29/2016 1:15 PM  Other: 11/29/2016 1:15 PM    Scribe for Treatment Team: Cleda Daub Celester Morgan, LCSW 11/29/2016 1:15 PM

## 2016-11-29 NOTE — [facility] Suicide Risk Assessment (Signed)
[facility]   Principal Problem: <principal problem not specified> Discharge Diagnoses:  Patient Active Problem List   Diagnosis Date Noted  . Severe episode of recurrent major depressive disorder, without psychotic features (HCC) [F33.2] 11/27/2016  . Cocaine abuse [F14.10]   . Substance induced mood disorder (HCC) [F19.94] 08/11/2016  . ADHD [F90.9] 08/11/2016  . Alcohol dependence with unspecified alcohol-induced disorder (HCC) [F10.29] 06/28/2016  . Alcohol withdrawal (HCC) [F10.239] 06/28/2016  . Cocaine use disorder, moderate, dependence (HCC) [F14.20] 06/28/2016  . Unspecified Depressive disorder [F32.9] 06/28/2016  . Tobacco use disorder [F17.200] 06/28/2016  . Noncompliance [Z91.19] 06/26/2016    T  Psychiatric Specialty Exam: ROS  Blood pressure (!) 141/88, pulse 66, temperature 97.8 F (36.6 C), temperature source Oral, resp. rate 18, height 5\' 2"  (1.575 m), weight 81.6 kg (180 lb), SpO2 99 %.Body mass index is 32.92 kg/m.                                                       Mental Status Per Nursing Assessment::   On Admission:  NA  Demographic Factors:  Male and Caucasian  Loss Factors: Legal issues  Historical Factors: Prior suicide attempts and Impulsivity  Risk Reduction Factors:   Sense of responsibility to family, Living with another person, especially a relative and Positive social support  No access to guns  Continued Clinical Symptoms:  Alcohol/Substance Abuse/Dependencies More than one psychiatric diagnosis Previous Psychiatric Diagnoses and Treatments  Cognitive Features That Contribute To Risk:  Closed-mindedness    Suicide Risk:  Minimal: No identifiable suicidal ideation.  Patients presenting with no risk factors but with morbid ruminations; may be classified as minimal risk based on the severity of the depressive symptoms    Alex [name],  [name], MD 11/29/2016, 10:02 AM

## 2016-12-08 ENCOUNTER — Emergency Department
Admission: EM | Admit: 2016-12-08 | Discharge: 2016-12-08 | Disposition: A | Discharge status: Home / Self Care | Payer: Medicaid Other | Attending: Emergency Medicine | Admitting: Emergency Medicine

## 2016-12-08 DIAGNOSIS — F1990 Other psychoactive substance use, unspecified, uncomplicated: Secondary | ICD-10-CM | POA: Insufficient documentation

## 2016-12-08 DIAGNOSIS — F17200 Nicotine dependence, unspecified, uncomplicated: Secondary | ICD-10-CM | POA: Insufficient documentation

## 2016-12-08 DIAGNOSIS — F329 Major depressive disorder, single episode, unspecified: Secondary | ICD-10-CM | POA: Insufficient documentation

## 2016-12-08 DIAGNOSIS — Z79899 Other long term (current) drug therapy: Secondary | ICD-10-CM | POA: Insufficient documentation

## 2016-12-08 DIAGNOSIS — F909 Attention-deficit hyperactivity disorder, unspecified type: Secondary | ICD-10-CM | POA: Insufficient documentation

## 2016-12-08 DIAGNOSIS — R45851 Suicidal ideations: Secondary | ICD-10-CM

## 2016-12-08 LAB — URINALYSIS, COMPLETE (UACMP) WITH MICROSCOPIC
BACTERIA UA: NONE SEEN
Bilirubin Urine: NEGATIVE
Glucose, UA: NEGATIVE mg/dL
Hgb urine dipstick: NEGATIVE
Ketones, ur: NEGATIVE mg/dL
Leukocytes, UA: NEGATIVE
Nitrite: NEGATIVE
PROTEIN: NEGATIVE mg/dL
Specific Gravity, Urine: 1.013 (ref 1.005–1.030)
Squamous Epithelial / LPF: NONE SEEN
pH: 5 (ref 5.0–8.0)

## 2016-12-08 LAB — URINE DRUG SCREEN, QUALITATIVE (ARMC ONLY)
AMPHETAMINES, UR SCREEN: NOT DETECTED
Barbiturates, Ur Screen: NOT DETECTED
Benzodiazepine, Ur Scrn: NOT DETECTED
Cannabinoid 50 Ng, Ur ~~LOC~~: POSITIVE — AB
Cocaine Metabolite,Ur ~~LOC~~: POSITIVE — AB
MDMA (ECSTASY) UR SCREEN: NOT DETECTED
METHADONE SCREEN, URINE: NOT DETECTED
Opiate, Ur Screen: NOT DETECTED
Phencyclidine (PCP) Ur S: NOT DETECTED
Tricyclic, Ur Screen: NOT DETECTED

## 2016-12-08 LAB — CBC
HCT: 46.7 % (ref 40.0–52.0)
HEMOGLOBIN: 16.4 g/dL (ref 13.0–18.0)
MCH: 33.4 pg (ref 26.0–34.0)
MCHC: 35.1 g/dL (ref 32.0–36.0)
MCV: 95.3 fL (ref 80.0–100.0)
Platelets: 217 10*3/uL (ref 150–440)
RBC: 4.9 MIL/uL (ref 4.40–5.90)
RDW: 14.6 % — ABNORMAL HIGH (ref 11.5–14.5)
WBC: 8 10*3/uL (ref 3.8–10.6)

## 2016-12-08 LAB — COMPREHENSIVE METABOLIC PANEL
ALT: 16 U/L — ABNORMAL LOW (ref 17–63)
AST: 22 U/L (ref 15–41)
Albumin: 4.3 g/dL (ref 3.5–5.0)
Alkaline Phosphatase: 90 U/L (ref 38–126)
Anion gap: 9 (ref 5–15)
BUN: 14 mg/dL (ref 6–20)
CO2: 23 mmol/L (ref 22–32)
CREATININE: 0.79 mg/dL (ref 0.61–1.24)
Calcium: 8.5 mg/dL — ABNORMAL LOW (ref 8.9–10.3)
Chloride: 104 mmol/L (ref 101–111)
GFR calc non Af Amer: >60 mL/min (ref >60)
Glucose, Bld: 113 mg/dL — ABNORMAL HIGH (ref 65–99)
Potassium: 3.7 mmol/L (ref 3.5–5.1)
SODIUM: 136 mmol/L (ref 135–145)
Total Bilirubin: 0.3 mg/dL (ref 0.3–1.2)
Total Protein: 8 g/dL (ref 6.5–8.1)

## 2016-12-08 LAB — ETHANOL: Alcohol, Ethyl (B): 138 mg/dL — ABNORMAL HIGH (ref <5)

## 2016-12-08 NOTE — ED Notes (Signed)
Pt sleeping comfortably at this time.

## 2016-12-08 NOTE — ED Provider Notes (Signed)
[facility] Emergency Department Provider Note   ____________________________________________   First MD Initiated Contact with Patient 12/08/16 [phone]     (approximate)  I have reviewed the triage vital signs and the nursing notes.   HISTORY  Chief Complaint Suicidal    HPI Alex [name] is a 41 y.o. male who comes into the hospital today feeling suicidal. The patient reports that he is going to jump [place]. He is unable to cope with a lot of stuff going on. He reports that he doesn't feel safe where he is by himself. He reports that he only drank one beer today and only did low by cocaine. He states it was not much. He cannot afford the medication he was sent home on when he was previously here. He reports that his Medicaid does not cover it. The patient does not name of his medications. He is here today for evaluation.   Past Medical History:  Diagnosis Date  . Alcohol abuse   . Anxiety   . Cocaine abuse 05/27/2016  . Depression     Patient Active Problem List   Diagnosis Date Noted  . Cocaine abuse   . ADHD 08/11/2016  . Alcohol dependence with unspecified alcohol-induced disorder (HCC) 06/28/2016  . Alcohol withdrawal (HCC) 06/28/2016  . Cocaine use disorder, moderate, dependence (HCC) 06/28/2016  . Unspecified Depressive disorder 06/28/2016  . Tobacco use disorder 06/28/2016    Past Surgical History:  Procedure Laterality Date  . FINGER SURGERY      Prior to Admission medications   Medication Sig Start Date End Date Taking? Authorizing Provider  atomoxetine (STRATTERA) 40 MG capsule Take 1 capsule (40 mg total) by mouth daily. Patient not taking: Reported on 11/27/2016 06/29/16   Jimmy Footman, MD  omeprazole (PRILOSEC) 20 MG capsule Take 20 mg by mouth 2 (two) times daily before a meal.    Historical Provider, MD  traZODone (DESYREL) 150 MG tablet Take by mouth at bedtime.    Historical Provider, MD    valproic acid (DEPAKENE) 250 MG capsule Take 3 capsules (750 mg total) by mouth at bedtime. 11/29/16   Jimmy Footman, MD    Allergies Peanut butter flavor  No family history on file.  Social History Social History  Substance Use Topics  . Smoking status: Current Every Day Smoker    Packs/day: 1.00  . Smokeless tobacco: Never Used  . Alcohol use 6.0 oz/week    10 Cans of beer per week    Review of Systems Constitutional: No fever/chills Eyes: No visual changes. ENT: No sore throat. Cardiovascular: Denies chest pain. Respiratory: Denies shortness of breath. Gastrointestinal: No abdominal pain.  No nausea, no vomiting.  No diarrhea.  No constipation. Genitourinary: Negative for dysuria. Musculoskeletal: Negative for back pain. Skin: Negative for rash. Neurological: Negative for headaches, focal weakness or numbness. Psych: Suicidal  10-point ROS otherwise negative.  ____________________________________________   PHYSICAL EXAM:  VITAL SIGNS: ED Triage Vitals  Enc Vitals Group     BP 12/08/16 0101 128/69     Pulse Rate 12/08/16 0101 (!) 111     Resp 12/08/16 0101 20     Temp 12/08/16 0101 98.3 F (36.8 C)     Temp Source 12/08/16 0101 Oral     SpO2 12/08/16 0101 93 %     Weight 12/08/16 0059 190 lb (86.2 kg)     Height 12/08/16 0059 5\' 5"  (1.651 m)     Head Circumference --  Peak Flow --      Pain Score 12/08/16 0059 0     Pain Loc --      Pain Edu? --      Excl. in GC? --     Constitutional: Alert and oriented. Well appearing and in mild distress. Eyes: Conjunctivae are normal. PERRL. EOMI. Head: Atraumatic. Nose: No congestion/rhinnorhea. Mouth/Throat: Mucous membranes are moist.  Oropharynx non-erythematous. Cardiovascular: Normal rate, regular rhythm. Grossly normal heart sounds.  Good peripheral circulation. Respiratory: Normal respiratory effort.  No retractions. Lungs CTAB. Gastrointestinal: Soft and nontender. No distention. No  abdominal bruits. No CVA tenderness. Musculoskeletal: No lower extremity tenderness nor edema.   Neurologic:  Normal speech and language.  Skin:  Skin is warm, dry and intact.  Psychiatric: Mood and affect are normal.   ____________________________________________   LABS (all labs ordered are listed, but only abnormal results are displayed)  Labs Reviewed  CBC - Abnormal; Notable for the following:       Result Value   RDW 14.6 (*)    All other components within normal limits  COMPREHENSIVE METABOLIC PANEL - Abnormal; Notable for the following:    Glucose, Bld 113 (*)    Calcium 8.5 (*)    ALT 16 (*)    All other components within normal limits  ETHANOL - Abnormal; Notable for the following:    Alcohol, Ethyl (B) 138 (*)    All other components within normal limits  URINALYSIS, COMPLETE (UACMP) WITH MICROSCOPIC - Abnormal; Notable for the following:    Color, Urine YELLOW (*)    APPearance CLEAR (*)    All other components within normal limits  URINE DRUG SCREEN, QUALITATIVE ([facility] ONLY) - Abnormal; Notable for the following:    Cocaine Metabolite,Ur [place] POSITIVE (*)    Cannabinoid 50 Ng, Ur [place] POSITIVE (*)    All other components within normal limits   ____________________________________________  EKG  none ____________________________________________  RADIOLOGY  none ____________________________________________   PROCEDURES  Procedure(s) performed: None  Procedures  Critical Care performed: No  ____________________________________________   INITIAL IMPRESSION / ASSESSMENT AND PLAN / ED COURSE  Pertinent labs & imaging results that were available during my care of the patient were reviewed by me and considered in my medical decision making (see chart for details).  This is a 41 year old who comes into the hospital today with suicidal ideation. We will have the patient seen by psych for further evaluation. He does not have any acute distress at this  time.      ____________________________________________   FINAL CLINICAL IMPRESSION(S) / ED DIAGNOSES  Final diagnoses:  Suicidal ideation      NEW MEDICATIONS STARTED DURING THIS VISIT:  New Prescriptions   No medications on file     Note:  This document was prepared using Dragon voice recognition software and may include unintentional dictation errors.    Rebecka ApleyAllison P Logyn Kendrick, MD 12/08/16 [phone]

## 2016-12-08 NOTE — ED Notes (Signed)
Pt sleeping. Breakfast tray placed at bedside 

## 2016-12-08 NOTE — ED Notes (Signed)
BEHAVIORAL HEALTH ROUNDING Patient sleeping: Yes.   Patient alert and oriented: eyes closed  Appears to be asleep Behavior appropriate: Yes.  ; If no, describe:  Nutrition and fluids offered: Yes  Toileting and hygiene offered: sleeping Sitter present: q 15 minute observations and security monitoring Law enforcement present: yes  ODS 

## 2016-12-08 NOTE — ED Notes (Signed)
Patient would like to speak to somebody because "I'm tired and lots of bad things keep happening." Patient admits to drinking a 40 ounce beer.

## 2016-12-08 NOTE — ED Notes (Signed)
Patient observed lying in bed with eyes closed  Even, unlabored respirations observed   NAD pt appears to be sleeping  I will continue to monitor along with every 15 minute visual observations and ongoing security monitoring    

## 2016-12-08 NOTE — Discharge Instructions (Signed)
Please return immediately if condition worsens. Please contact her primary physician or the physician you were given for referral. If you have any specialist physicians involved in her treatment and plan please also contact them. Thank you for using [facility]. ° °

## 2016-12-08 NOTE — ED Notes (Signed)
Pt eating breakfast. When pt was ask if he wanted to take a shower pt stated "I'm good". Pt was informed if he didn't get a shower this morning he may not get one today and again pt stated "I'm good". Pt given sprite

## 2016-12-08 NOTE — ED Notes (Signed)

## 2016-12-08 NOTE — ED Notes (Signed)
BEHAVIORAL HEALTH ROUNDING Patient sleeping: No. Patient alert and oriented: yes Behavior appropriate: Yes.  ; If no, describe:  Nutrition and fluids offered: yes Toileting and hygiene offered: Yes  Sitter present: q15 minute observations and security  monitoring Law enforcement present: Yes  ODS  

## 2016-12-08 NOTE — BH Assessment (Signed)
Assessment Note  Alex [name] is an 41 y.o. male who presents to the ER due to having thoughts of ending his life. Patient reported he had a plan, when asked for the plan he stated he didn't know. Patient further reports of been stressed and not "able to take it."  When asked the stressors, the patient stated "I can't say, it's just so much."  During the interview, the patient was calm, cooperative and pleasant.n  He admits to abusing alcohol and cannabis.  Diagnosis: Depression and substance use  Past Medical History:  Past Medical History:  Diagnosis Date  . Alcohol abuse   . Anxiety   . Cocaine abuse 05/27/2016  . Depression     Past Surgical History:  Procedure Laterality Date  . FINGER SURGERY      Family History: No family history on file.  Social History:  reports that he has been smoking.  He has been smoking about 1.00 pack per day. He has never used smokeless tobacco. He reports that he drinks about 6.0 oz of alcohol per week . He reports that he uses drugs, including Marijuana and Cocaine.  Additional Social History:  Alcohol / Drug Use Pain Medications: See PTA Prescriptions: See PTA History of alcohol / drug use?: Yes Longest period of sobriety (when/how long): Unable to quantify Negative Consequences of Use:  (n/a) Withdrawal Symptoms:  (n/a) Substance #1 Name of Substance 1: Alcohol Substance #2 Name of Substance 2: Cannabis  CIWA: CIWA-Ar BP: 111/78 Pulse Rate: 78 COWS:    Allergies:  Allergies  Allergen Reactions  . Peanut Butter Flavor     Home Medications:  (Not in a hospital admission)  OB/GYN Status:  No LMP for male patient.  General Assessment Data Location of Assessment: [facility] ED TTS Assessment: In system Is this a Tele or Face-to-Face Assessment?: Face-to-Face Is this an Initial Assessment or a Re-assessment for this encounter?: Initial Assessment Marital status: Long term relationship Maiden name: n/a Is patient pregnant?:  No Pregnancy Status: No Living Arrangements: Parent Can pt return to current living arrangement?: Yes Admission Status: Voluntary Is patient capable of signing voluntary admission?: Yes Insurance type: Medicaid  Medical Screening Exam [facility] Walk-in ONLY) Medical Exam completed: Yes  Crisis Care Plan Living Arrangements: Parent Legal Guardian: Other: (Self) Name of Psychiatrist: [facility] Name of Therapist: [facility]  Education Status Is patient currently in school?: No Current Grade: n/a Highest grade of school patient has completed: n/a Name of school: n/a Contact person: n/a  Risk to self with the past 6 months Suicidal Ideation: No-Not Currently/Within Last 6 Months Has patient been a risk to self within the past 6 months prior to admission? : No Suicidal Intent: No Has patient had any suicidal intent within the past 6 months prior to admission? : No Is patient at risk for suicide?: No Suicidal Plan?: No Has patient had any suicidal plan within the past 6 months prior to admission? : No Access to Means: No What has been your use of drugs/alcohol within the last 12 months?: Alcohol and Cannabis Previous Attempts/Gestures: No How many times?: 0 Other Self Harm Risks: Active Addiction  Triggers for Past Attempts: None known Intentional Self Injurious Behavior: None Family Suicide History: No Recent stressful life event(s): Other (Comment) Persecutory voices/beliefs?: No Depression: Yes Depression Symptoms: Isolating, Feeling angry/irritable, Guilt Substance abuse history and/or treatment for substance abuse?: Yes Suicide prevention information given to non-admitted patients: Not applicable  Risk to Others within the past 6 months Homicidal Ideation:  No Does patient have any lifetime risk of violence toward others beyond the six months prior to admission? : No Thoughts of Harm to Others: No Current Homicidal Intent: No Current Homicidal Plan: No Access to Homicidal Means:  No Identified Victim: Reports of none History of harm to others?: No Assessment of Violence: None Noted Violent Behavior Description: Reports of none Does patient have access to weapons?: No Criminal Charges Pending?: No Does patient have a court date: No Is patient on probation?: No  Psychosis Hallucinations: None noted Delusions: None noted  Mental Status Report Appearance/Hygiene: Unremarkable, In scrubs Eye Contact: Fair Motor Activity: Freedom of movement, Unremarkable Speech: Soft, Logical/coherent Level of Consciousness: Alert Mood: Depressed, Anxious, Sad Affect: Appropriate to circumstance, Sad Anxiety Level: Minimal Thought Processes: Coherent, Relevant Judgement: Unimpaired Orientation: Person, Place, Time, Situation, Appropriate for developmental age Obsessive Compulsive Thoughts/Behaviors: Minimal  Cognitive Functioning Concentration: Normal Memory: Recent Intact, Remote Intact IQ: Average Insight: Fair Impulse Control: Fair Appetite: Fair Weight Loss: 0 Weight Gain: 0 Sleep: No Change Total Hours of Sleep: 8 Vegetative Symptoms: None  ADLScreening [facility] Assessment Services) Patient's cognitive ability adequate to safely complete daily activities?: Yes Patient able to express need for assistance with ADLs?: Yes Independently performs ADLs?: Yes (appropriate for developmental age)  Prior Inpatient Therapy Prior Inpatient Therapy: Yes Prior Therapy Dates: 11/2016, 06/2016, 05/2016 & 07/2011 [facility] Reason for Treatment: Depression and substance use  Prior Outpatient Therapy Prior Outpatient Therapy: Yes Prior Therapy Dates: Current Prior Therapy Facilty/Provider(s): [facility] Reason for Treatment: Depression and Substance Use Does patient have an ACCT team?: No Does patient have Intensive In-House Services?  : No Does patient have [facility] services? : No Does patient have P4CC services?: No  ADL Screening (condition  at time of admission) Patient's cognitive ability adequate to safely complete daily activities?: Yes Is the patient deaf or have difficulty hearing?: No Does the patient have difficulty seeing, even when wearing glasses/contacts?: No Does the patient have difficulty concentrating, remembering, or making decisions?: No Patient able to express need for assistance with ADLs?: Yes Does the patient have difficulty dressing or bathing?: No Independently performs ADLs?: Yes (appropriate for developmental age) Does the patient have difficulty walking or climbing stairs?: No Weakness of Legs: None Weakness of Arms/Hands: None  Home Assistive Devices/Equipment Home Assistive Devices/Equipment: None  Therapy Consults (therapy consults require a physician order) PT Evaluation Needed: No OT Evalulation Needed: No SLP Evaluation Needed: No Abuse/Neglect Assessment (Assessment to be complete while patient is alone) Physical Abuse: Denies Verbal Abuse: Denies Sexual Abuse: Denies Exploitation of patient/patient's resources: Denies Self-Neglect: Denies Values / Beliefs Cultural Requests During Hospitalization: None Spiritual Requests During Hospitalization: None Consults Spiritual Care Consult Needed: No Social Work Consult Needed: No      Additional Information 1:1 In Past 12 Months?: No CIRT Risk: No Elopement Risk: No Does patient have medical clearance?: Yes  Child/Adolescent Assessment Running Away Risk: Denies (Patient is an adult)  Disposition:  Disposition Initial Assessment Completed for this Encounter: Yes Disposition of Patient: Other dispositions (ER MD Ordered Psych Consult)  On Site Evaluation by:   Reviewed with Physician:    Lilyan Gilfordalvin J. Javarie Crisp MS, LCAS, LPC, NCC, CCSI Therapeutic Triage Specialist 12/08/2016 4:27 PM

## 2016-12-08 NOTE — ED Notes (Signed)
Pt given lunch tray.

## 2016-12-08 NOTE — ED Triage Notes (Signed)
Pt here for suicidal ideation [place] [facility] [place]

## 2016-12-08 NOTE — ED Notes (Signed)
Awake - ambulatory to the bR with a steady gait  TTS consultant has been in to see him   Pt shown breakfast tray at by his sink  Assess ment completed  Pt aware that he is voluntarily awaiting psych consult

## 2016-12-08 NOTE — ED Notes (Signed)
ED  Is the patient under IVC or is there intent for IVC: No Is the patient medically cleared: Yes.   Is there vacancy in the ED BHU: Yes.   Is the population mix appropriate for patient: Yes.   Is the patient awaiting placement in inpatient or outpatient setting: No. Has the patient had a psychiatric consult:   Consult pending  Survey of unit performed for contraband, proper placement and condition of furniture, tampering with fixtures in bathroom, shower, and each patient room: Yes.  ; Findings:  APPEARANCE/BEHAVIOR Calm and cooperative NEURO ASSESSMENT Orientation: oriented x4  Denies pain Hallucinations: No.None noted (Hallucinations) Speech: Normal Gait: normal RESPIRATORY ASSESSMENT even unlabored respirations  CARDIOVASCULAR ASSESSMENT Pulses equal  regular rate  Skin warm and dry GASTROINTESTINAL ASSESSMENT no GI complaint EXTREMITIES Full ROM PLAN OF CARE Provide calm/safe environment. Vital signs assessed twice daily. ED BHU Assessment once each 12-hour shift. Collaborate with TTS daily or as condition indicates. Assure the ED provider has rounded once each shift. Provide and encourage hygiene. Provide redirection as needed. Assess for escalating behavior; address immediately and inform ED provider.  Assess family dynamic and appropriateness for visitation as needed: Yes.  ; If necessary, describe findings:  Educate the patient/family about BHU procedures/visitation: Yes.  ; If necessary, describe findings:

## 2016-12-08 NOTE — ED Notes (Signed)
Unable to obtain Psych assessment at this time due to pt non-compliant. MD made aware of SOC delay as well.

## 2016-12-08 NOTE — ED Provider Notes (Signed)
-----------------------------------------   1:24 PM on 12/08/2016 -----------------------------------------   Blood pressure 111/69, pulse 97, temperature 98.3 F (36.8 C), temperature source Oral, resp. rate 18, height 5\' 5"  (1.651 m), weight 190 lb (86.2 kg), SpO2 95 %.  Assuming care from Dr. Zenda AlpersWebster.  In short, Alex [name] is a 41 y.o. male with a chief complaint of Suicidal .  Refer to the original H&P for additional details.  The current plan of care is to follow the patient to a psychiatric evaluation note he is here on a voluntary basis. Patient asked me to evaluate him and states he does not feel suicidal and no longer wants to stay here in emergency department. He does have a history of substance abuse and states that he couldn't afford the medication that was prescribed for him on his last visit here to the emergency department. He was provided with a discount card per the pharmacy and advised to continue with all of his other medications. The obvious suggestion was to stop using the cocaine and marijuana and tried to purchase the medication. He was advised he can always return here again if he becomes suicidal, homicidal or having any persistent hallucinations        Jennye MoccasinBrian S Cecille Mcclusky, MD 12/08/16 1325

## 2016-12-08 NOTE — ED Notes (Signed)
Pt cut light out and returned to bed.

## 2016-12-08 NOTE — Consult Note (Signed)
Patient was discharged from the emergency room at his own request prior to my being able to see the patient for consult.

## 2016-12-10 ENCOUNTER — Encounter: Payer: Self-pay | Admitting: *Deleted

## 2016-12-10 DIAGNOSIS — F1910 Other psychoactive substance abuse, uncomplicated: Secondary | ICD-10-CM | POA: Diagnosis not present

## 2016-12-10 DIAGNOSIS — F909 Attention-deficit hyperactivity disorder, unspecified type: Secondary | ICD-10-CM | POA: Diagnosis not present

## 2016-12-10 DIAGNOSIS — Z79899 Other long term (current) drug therapy: Secondary | ICD-10-CM | POA: Diagnosis not present

## 2016-12-10 DIAGNOSIS — R45851 Suicidal ideations: Secondary | ICD-10-CM | POA: Diagnosis present

## 2016-12-10 DIAGNOSIS — F17210 Nicotine dependence, cigarettes, uncomplicated: Secondary | ICD-10-CM | POA: Diagnosis not present

## 2016-12-10 NOTE — ED Triage Notes (Signed)
Pt makes repeated statements that he wants to kill himself. Pt admits to ETOH use today, denies illegal drug use. [place] [facility] and is in handcuffs, presently. Pt presents w/ several shallow, self-inflicted cuts with a butcher knife to LFA and L lateral calf. Bleeding is controlled at this time. Pt is presently IVC'd at this time.

## 2016-12-11 ENCOUNTER — Emergency Department
Admission: EM | Admit: 2016-12-11 | Discharge: 2016-12-11 | Payer: Medicaid Other | Attending: Student in an Organized Health Care Education/Training Program | Admitting: Student in an Organized Health Care Education/Training Program

## 2016-12-11 DIAGNOSIS — R45851 Suicidal ideations: Secondary | ICD-10-CM

## 2016-12-11 DIAGNOSIS — F1920 Other psychoactive substance dependence, uncomplicated: Secondary | ICD-10-CM

## 2016-12-11 DIAGNOSIS — F1910 Other psychoactive substance abuse, uncomplicated: Secondary | ICD-10-CM

## 2016-12-11 LAB — CBC
HCT: 44.8 % (ref 40.0–52.0)
HEMOGLOBIN: 15.8 g/dL (ref 13.0–18.0)
MCH: 33.7 pg (ref 26.0–34.0)
MCHC: 35.2 g/dL (ref 32.0–36.0)
MCV: 95.8 fL (ref 80.0–100.0)
Platelets: 199 10*3/uL (ref 150–440)
RBC: 4.67 MIL/uL (ref 4.40–5.90)
RDW: 14.3 % (ref 11.5–14.5)
WBC: 7.6 10*3/uL (ref 3.8–10.6)

## 2016-12-11 LAB — ACETAMINOPHEN LEVEL: Acetaminophen (Tylenol), Serum: <10 ug/mL — ABNORMAL LOW (ref 10–30)

## 2016-12-11 LAB — COMPREHENSIVE METABOLIC PANEL
ALK PHOS: 93 U/L (ref 38–126)
ALT: 19 U/L (ref 17–63)
ANION GAP: 8 (ref 5–15)
AST: 25 U/L (ref 15–41)
Albumin: 4.5 g/dL (ref 3.5–5.0)
BUN: 11 mg/dL (ref 6–20)
CO2: 25 mmol/L (ref 22–32)
CREATININE: 0.82 mg/dL (ref 0.61–1.24)
Calcium: 8.6 mg/dL — ABNORMAL LOW (ref 8.9–10.3)
Chloride: 106 mmol/L (ref 101–111)
Glucose, Bld: 98 mg/dL (ref 65–99)
Potassium: 3.9 mmol/L (ref 3.5–5.1)
Sodium: 139 mmol/L (ref 135–145)
Total Bilirubin: 0.4 mg/dL (ref 0.3–1.2)
Total Protein: 8.2 g/dL — ABNORMAL HIGH (ref 6.5–8.1)

## 2016-12-11 LAB — URINE DRUG SCREEN, QUALITATIVE (ARMC ONLY)
Amphetamines, Ur Screen: NOT DETECTED
Barbiturates, Ur Screen: NOT DETECTED
Benzodiazepine, Ur Scrn: NOT DETECTED
CANNABINOID 50 NG, UR ~~LOC~~: NOT DETECTED
COCAINE METABOLITE, UR ~~LOC~~: POSITIVE — AB
MDMA (ECSTASY) UR SCREEN: NOT DETECTED
Methadone Scn, Ur: NOT DETECTED
Opiate, Ur Screen: NOT DETECTED
PHENCYCLIDINE (PCP) UR S: NOT DETECTED
Tricyclic, Ur Screen: NOT DETECTED

## 2016-12-11 LAB — ETHANOL: Alcohol, Ethyl (B): 283 mg/dL — ABNORMAL HIGH (ref <5)

## 2016-12-11 LAB — SALICYLATE LEVEL: Salicylate Lvl: <7 mg/dL (ref 2.8–30.0)

## 2016-12-11 MED ORDER — TRAZODONE HCL 50 MG PO TABS: 150.0000 mg | ORAL_TABLET | Freq: Every day | ORAL | Status: DC

## 2016-12-11 MED ORDER — CITALOPRAM HYDROBROMIDE 20 MG PO TABS
40.0000 mg | ORAL_TABLET | Freq: Every day | ORAL | Status: DC
  Administered 2016-12-11: 40 mg via ORAL
  Filled 2016-12-11: qty 2

## 2016-12-11 MED ORDER — PANTOPRAZOLE SODIUM 40 MG PO TBEC
40.0000 mg | DELAYED_RELEASE_TABLET | Freq: Every day | ORAL | Status: DC
  Administered 2016-12-11: 40 mg via ORAL
  Filled 2016-12-11: qty 1

## 2016-12-11 NOTE — ED Notes (Signed)
Referral information for Psychiatric Hospitalization faxed to;      [facility] [phone]),      [facility] [phone]),    [facility] [phone]),    [facility] [phone])   [facility]  [phone]),    [facility] [phone]),

## 2016-12-11 NOTE — ED Notes (Signed)
Pt's mother, [phone], called at pt's request to verify meds

## 2016-12-11 NOTE — BH Assessment (Signed)
Assessment Note  Alex [name] is an 40 y.o. male presenting to the ED  with alcohol intoxication and suicidal ideation with plans to use a butcher knife that he is used to make multiple self inflicted cuts to bilateral arms and left leg. Patient states he needs help and that if discharged he would jump from the building.  Pt reports seeking assistance with finding somewhere to stay.  Diagnosis: Alcohol abuse  Past Medical History:  Past Medical History:  Diagnosis Date  . Alcohol abuse   . Anxiety   . Cocaine abuse 05/27/2016  . Depression     Past Surgical History:  Procedure Laterality Date  . FINGER SURGERY      Family History: History reviewed. No pertinent family history.  Social History:  reports that he has been smoking Cigarettes.  He has been smoking about 1.00 pack per day. He has never used smokeless tobacco. He reports that he drinks about 25.2 oz of alcohol per week . He reports that he uses drugs, including Marijuana and Cocaine.  Additional Social History:  Alcohol / Drug Use Pain Medications: See PTA Prescriptions: See PTA Over the Counter: See PTA History of alcohol / drug use?: Yes  CIWA: CIWA-Ar BP: 116/79 Pulse Rate: 84 COWS:    Allergies:  Allergies  Allergen Reactions  . Peanut Butter Flavor     Home Medications:  (Not in a hospital admission)  OB/GYN Status:  No LMP for male patient.  General Assessment Data Location of Assessment: [facility] ED TTS Assessment: In system Is this a Tele or Face-to-Face Assessment?: Face-to-Face Is this an Initial Assessment or a Re-assessment for this encounter?: Initial Assessment Marital status: Long term relationship Maiden name: n/a Is patient pregnant?: No Pregnancy Status: No Living Arrangements: Parent Can pt return to current living arrangement?: Yes Admission Status: Involuntary Is patient capable of signing voluntary admission?: Yes Referral Source: Self/Family/Friend Insurance type: Medicaid      Crisis Care Plan Living Arrangements: Parent Legal Guardian: Other: (self) Name of Psychiatrist: [facility] Name of Therapist: [facility]  Education Status Is patient currently in school?: No Current Grade: n/a Highest grade of school patient has completed: n/a Name of school: n/a Contact person: n/a  Risk to self with the past 6 months Suicidal Ideation: Yes-Currently Present Has patient been a risk to self within the past 6 months prior to admission? : No Suicidal Intent: No Has patient had any suicidal intent within the past 6 months prior to admission? : No Is patient at risk for suicide?: No Has patient had any suicidal plan within the past 6 months prior to admission? : No Access to Means: Yes Specify Access to Suicidal Means: Pt has acess to bridge What has been your use of drugs/alcohol within the last 12 months?: alcohol, cocaine and marijuana Previous Attempts/Gestures: Yes How many times?: 1 Other Self Harm Risks: active addiction Triggers for Past Attempts: None known Intentional Self Injurious Behavior: Cutting Comment - Self Injurious Behavior: Pt has superficial cuts on forearm and calves Family Suicide History: No Recent stressful life event(s): Other (Comment) (addiction) Persecutory voices/beliefs?: Yes Depression: Yes Depression Symptoms: Loss of interest in usual pleasures, Feeling worthless/self pity, Isolating Substance abuse history and/or treatment for substance abuse?: Yes Suicide prevention information given to non-admitted patients: Not applicable  Risk to Others within the past 6 months Homicidal Ideation: No Does patient have any lifetime risk of violence toward others beyond the six months prior to admission? : No Thoughts of Harm  to Others: No Current Homicidal Intent: No Current Homicidal Plan: No Access to Homicidal Means: No Identified Victim: None identified History of harm to others?: No Assessment of Violence: None  Noted Violent Behavior Description: None identified Does patient have access to weapons?: No Criminal Charges Pending?: No Does patient have a court date: No Is patient on probation?: No  Psychosis Hallucinations: None noted Delusions: None noted  Mental Status Report Appearance/Hygiene: Unremarkable, In scrubs Eye Contact: Good Motor Activity: Freedom of movement Speech: Soft, Logical/coherent Level of Consciousness: Alert Mood: Depressed Affect: Appropriate to circumstance, Sad Anxiety Level: Minimal Thought Processes: Relevant Judgement: Partial Orientation: Person, Place, Time, Situation, Appropriate for developmental age Obsessive Compulsive Thoughts/Behaviors: Minimal  Cognitive Functioning Concentration: Good Memory: Recent Intact, Remote Intact IQ: Average Insight: Poor Impulse Control: Poor Appetite: Good Weight Loss: 0 Weight Gain: 0 Sleep: No Change Vegetative Symptoms: None  ADLScreening [facility] Assessment Services) Patient's cognitive ability adequate to safely complete daily activities?: Yes Patient able to express need for assistance with ADLs?: Yes Independently performs ADLs?: Yes (appropriate for developmental age)  Prior Inpatient Therapy Prior Inpatient Therapy: Yes Prior Therapy Dates: 11/2016, 06/2016, 05/2016 & 07/2011 [facility] Reason for Treatment: Depression and substance use  Prior Outpatient Therapy Prior Outpatient Therapy: Yes Prior Therapy Dates: Current Prior Therapy Facilty/Provider(s): [facility] Reason for Treatment: Depression and Substance Use Does patient have an ACCT team?: No Does patient have Intensive In-House Services?  : No Does patient have [facility] services? : No Does patient have P4CC services?: No  ADL Screening (condition at time of admission) Patient's cognitive ability adequate to safely complete daily activities?: Yes Patient able to express need for assistance with ADLs?:  Yes Independently performs ADLs?: Yes (appropriate for developmental age)       Abuse/Neglect Assessment (Assessment to be complete while patient is alone) Physical Abuse: Denies Verbal Abuse: Denies Sexual Abuse: Denies Self-Neglect: Denies Values / Beliefs Cultural Requests During Hospitalization: None Spiritual Requests During Hospitalization: None Consults Spiritual Care Consult Needed: No Social Work Consult Needed: No Merchant navy officerAdvance Directives (For Healthcare) Does Patient Have a Medical Advance Directive?: No    Additional Information 1:1 In Past 12 Months?: No CIRT Risk: No Elopement Risk: No Does patient have medical clearance?: Yes     Disposition:  Disposition Initial Assessment Completed for this Encounter: Yes Disposition of Patient: Other dispositions (ER MD Ordered Psych Consult) Other disposition(s): Other (Comment) ([facility]  On Site Evaluation by:   Reviewed with Physician:    Neftaly Swiss C Kataya Guimont 12/11/2016 3:06 AM

## 2016-12-11 NOTE — ED Notes (Signed)
Received report form ED quad RN, patient transferred to room 4. Patient alert and oriented and cooperative. Nurse will continue to monitor.

## 2016-12-11 NOTE — ED Provider Notes (Signed)
[facility] Emergency Department Provider Note    First MD Initiated Contact with Patient 12/11/16 0010  `   (approximate)  I have reviewed the triage vital signs and the nursing notes.   HISTORY  Chief Complaint Suicidal    HPI Alex [name] is a 41 y.o. male 1 and his facility presents with acute alcohol intoxication and suicidal ideation with plan to consult with a butcher knife that he is used to make multiple self inflicted cuts to bilateral arms and left leg. Patient with a history of cocaine abuse but denies any other illicit substances other than alcohol today. States that he feels "worthless" and "has no reason to live"   Past Medical History:  Diagnosis Date  . Alcohol abuse   . Anxiety   . Cocaine abuse 05/27/2016  . Depression    History reviewed. No pertinent family history. Past Surgical History:  Procedure Laterality Date  . FINGER SURGERY     Patient Active Problem List   Diagnosis Date Noted  . Cocaine abuse   . ADHD 08/11/2016  . Alcohol dependence with unspecified alcohol-induced disorder (HCC) 06/28/2016  . Alcohol withdrawal (HCC) 06/28/2016  . Cocaine use disorder, moderate, dependence (HCC) 06/28/2016  . Unspecified Depressive disorder 06/28/2016  . Tobacco use disorder 06/28/2016      Prior to Admission medications   Medication Sig Start Date End Date Taking? Authorizing Provider  atomoxetine (STRATTERA) 40 MG capsule Take 1 capsule (40 mg total) by mouth daily. Patient not taking: Reported on 12/08/2016 06/29/16   Jimmy Footman, MD  citalopram (CELEXA) 20 MG tablet Take 20 mg by mouth daily.    Historical Provider, MD  ketorolac (TORADOL) 10 MG tablet Take 10 mg by mouth every 6 (six) hours as needed.    Historical Provider, MD  Multiple Vitamin (MULTIVITAMIN) tablet Take 1 tablet by mouth daily.    Historical Provider, MD  omeprazole (PRILOSEC) 20 MG capsule Take 20 mg by mouth 2 (two) times daily  before a meal.    Historical Provider, MD  ondansetron (ZOFRAN) 4 MG tablet Take 4 mg by mouth every 8 (eight) hours as needed for nausea or vomiting.    Historical Provider, MD  oxyCODONE (OXY IR/ROXICODONE) 5 MG immediate release tablet Take 5 mg by mouth every 4 (four) hours as needed for severe pain.    Historical Provider, MD  senna (SENOKOT) 8.6 MG TABS tablet Take 1 tablet by mouth daily as needed for mild constipation.    Historical Provider, MD  traZODone (DESYREL) 150 MG tablet Take by mouth at bedtime.    Historical Provider, MD  valproic acid (DEPAKENE) 250 MG capsule Take 3 capsules (750 mg total) by mouth at bedtime. Patient not taking: Reported on 12/08/2016 11/29/16   Jimmy Footman, MD    Allergies Peanut butter flavor    Social History Social History  Substance Use Topics  . Smoking status: Current Every Day Smoker    Packs/day: 1.00    Types: Cigarettes  . Smokeless tobacco: Never Used  . Alcohol use 25.2 oz/week    42 Cans of beer per week     Comment: pt admits to drinking 6+ beers a day    Review of Systems Patient denies headaches, rhinorrhea, blurry vision, numbness, shortness of breath, chest pain, edema, cough, abdominal pain, nausea, vomiting, diarrhea, dysuria, fevers, rashes or hallucinations unless otherwise stated above in HPI. ____________________________________________   PHYSICAL EXAM:  VITAL SIGNS: Vitals:   12/10/16 2312  BP: 116/79  Pulse: 84  Resp: 18  Temp: 98.7 F (37.1 C)    Constitutional: Alert, disheveled, smells of alcohol, in no acute distress. Eyes: Conjunctivae are normal. PERRL. EOMI. Head: Atraumatic. Nose: No congestion/rhinnorhea. Mouth/Throat: Mucous membranes are moist.  Oropharynx non-erythematous. Neck: No stridor. Painless ROM. No cervical spine tenderness to palpation Hematological/Lymphatic/Immunilogical: No cervical lymphadenopathy. Cardiovascular: Normal rate, regular rhythm. Grossly normal heart  sounds.  Good peripheral circulation. Respiratory: Normal respiratory effort.  No retractions. Lungs CTAB. Gastrointestinal: Soft and nontender. No distention. No abdominal bruits. No CVA tenderness. Musculoskeletal: No lower extremity tenderness nor edema.  No joint effusions. Neurologic:  Normal speech and language. No gross focal neurologic deficits are appreciated. No gait instability. Skin:  Multiple supericial scratch like abrasions to right forearm and one abrasion to lateral left leg roughly 3cm in length.  Hemostatic, appear old Psychiatric: intoxicated, "depressed"  ____________________________________________   LABS (all labs ordered are listed, but only abnormal results are displayed)  Results for orders placed or performed during the hospital encounter of 12/11/16 (from the past 24 hour(s))  Comprehensive metabolic panel     Status: Abnormal   Collection Time: 12/10/16 11:28 PM  Result Value Ref Range   Sodium 139 135 - 145 mmol/L   Potassium 3.9 3.5 - 5.1 mmol/L   Chloride 106 101 - 111 mmol/L   CO2 25 22 - 32 mmol/L   Glucose, Bld 98 65 - 99 mg/dL   BUN 11 6 - 20 mg/dL   Creatinine, Ser 0.860.82 0.61 - 1.24 mg/dL   Calcium 8.6 (L) 8.9 - 10.3 mg/dL   Total Protein 8.2 (H) 6.5 - 8.1 g/dL   Albumin 4.5 3.5 - 5.0 g/dL   AST 25 15 - 41 U/L   ALT 19 17 - 63 U/L   Alkaline Phosphatase 93 38 - 126 U/L   Total Bilirubin 0.4 0.3 - 1.2 mg/dL   GFR calc non Af Amer >60 >60 mL/min   GFR calc Af Amer >60 >60 mL/min   Anion gap 8 5 - 15  Ethanol     Status: Abnormal   Collection Time: 12/10/16 11:28 PM  Result Value Ref Range   Alcohol, Ethyl (B) 283 (H) <5 mg/dL  Salicylate level     Status: None   Collection Time: 12/10/16 11:28 PM  Result Value Ref Range   Salicylate Lvl <7.0 2.8 - 30.0 mg/dL  Acetaminophen level     Status: Abnormal   Collection Time: 12/10/16 11:28 PM  Result Value Ref Range   Acetaminophen (Tylenol), Serum <10 (L) 10 - 30 ug/mL  cbc     Status: None    Collection Time: 12/10/16 11:28 PM  Result Value Ref Range   WBC 7.6 3.8 - 10.6 K/uL   RBC 4.67 4.40 - 5.90 MIL/uL   Hemoglobin 15.8 13.0 - 18.0 g/dL   HCT 57.844.8 46.940.0 - 62.952.0 %   MCV 95.8 80.0 - 100.0 fL   MCH 33.7 26.0 - 34.0 pg   MCHC 35.2 32.0 - 36.0 g/dL   RDW 52.814.3 41.311.5 - 24.414.5 %   Platelets 199 150 - 440 K/uL  Urine Drug Screen, Qualitative     Status: Abnormal   Collection Time: 12/10/16 11:28 PM  Result Value Ref Range   Tricyclic, Ur Screen NONE DETECTED NONE DETECTED   Amphetamines, Ur Screen NONE DETECTED NONE DETECTED   MDMA (Ecstasy)Ur Screen NONE DETECTED NONE DETECTED   Cocaine Metabolite,Ur [place] POSITIVE (A) NONE DETECTED   Opiate, Ur  Screen NONE DETECTED NONE DETECTED   Phencyclidine (PCP) Ur S NONE DETECTED NONE DETECTED   Cannabinoid 50 Ng, Ur [place] NONE DETECTED NONE DETECTED   Barbiturates, Ur Screen NONE DETECTED NONE DETECTED   Benzodiazepine, Ur Scrn NONE DETECTED NONE DETECTED   Methadone Scn, Ur NONE DETECTED NONE DETECTED   ____________________________________________  EKG____________________________________________  RADIOLOGY   ____________________________________________   PROCEDURES  Procedure(s) performed:  Procedures    Critical Care performed: no ____________________________________________   INITIAL IMPRESSION / ASSESSMENT AND PLAN / ED COURSE  Pertinent labs & imaging results that were available during my care of the patient were reviewed by me and considered in my medical decision making (see chart for details).  DDX: Psychosis, delirium, medication effect, noncompliance, polysubstance abuse, Si, Hi, depression   Alex [name] is a 41 y.o. who presents to the ED with for evaluation of SI and substance abuse.  Patient is AFVSS in ED. Exam as above. Given current presentation have considered the above differential.   Patient has psych history of depression and cutting behavior with polysubstance abuse.  Laboratory testing was ordered to  evaluation for underlying electrolyte derangement or signs of underlying organic pathology to explain today's presentation.  Based on history and physical and laboratory evaluation, it appears that the patient's presentation is 2/2 underlying psychiatric disorder and will require further evaluation and management by inpatient psychiatry.  Patient was made an IVC due to polysubstance abuse and SI.  Disposition pending psychiatric evaluation.      ____________________________________________   FINAL CLINICAL IMPRESSION(S) / ED DIAGNOSES  Final diagnoses:  Suicidal ideation  Polysubstance abuse  Addiction to drug [facility])      NEW MEDICATIONS STARTED DURING THIS VISIT:  New Prescriptions   No medications on file     Note:  This document was prepared using Dragon voice recognition software and may include unintentional dictation errors.    Willy Eddy, MD 12/11/16 [phone]

## 2016-12-11 NOTE — ED Notes (Signed)
Patient took po medications, he is calm and cooperative, remains with suicidal ideation, states if He leaves He will try to kill himself, patient with q 15 minute checks and camera surveillance in progress, Patient with referrals sent out to different hospitals at this time, awaiting bed.

## 2016-12-11 NOTE — ED Notes (Signed)
telepsych, Dr Sharlene MottsMillan Sanchez, called for pt info, with pt att, this RN to bedside twice to wake pt

## 2016-12-11 NOTE — ED Notes (Signed)
Patient ate 100% of lunch, patient without complaints at this time, q 15 minute checks and camera surveillance in progress.

## 2016-12-11 NOTE — ED Notes (Signed)
Patient up to bathroom, no signs of distress, will continue to monitor. No behavioral issues noted.

## 2016-12-11 NOTE — ED Notes (Signed)
Nurse called [facility] to [facility], reported to [facility], Patient dressed and left with law enforcement, Patient calm, and all belongings with patient.

## 2016-12-11 NOTE — ED Notes (Signed)
Pt given fluids and meal tray

## 2016-12-11 NOTE — ED Notes (Addendum)
Patient has been accepted [facility].  Patient assigned to room TBD Accepting physician is Dr. Roselyn Reefreque .  Call report to [phone].  Representative was [place].  ER Staff is aware of it [facility] Dr. Alphonzo LemmingsMcShane, ER MD & Toniann FailWendy Patient's Nurse)

## 2016-12-25 DIAGNOSIS — F17210 Nicotine dependence, cigarettes, uncomplicated: Secondary | ICD-10-CM | POA: Diagnosis not present

## 2016-12-25 DIAGNOSIS — R45851 Suicidal ideations: Secondary | ICD-10-CM | POA: Diagnosis present

## 2016-12-25 DIAGNOSIS — F329 Major depressive disorder, single episode, unspecified: Secondary | ICD-10-CM | POA: Insufficient documentation

## 2016-12-25 DIAGNOSIS — F1010 Alcohol abuse, uncomplicated: Secondary | ICD-10-CM | POA: Insufficient documentation

## 2016-12-25 DIAGNOSIS — F909 Attention-deficit hyperactivity disorder, unspecified type: Secondary | ICD-10-CM | POA: Diagnosis not present

## 2016-12-25 DIAGNOSIS — Z79899 Other long term (current) drug therapy: Secondary | ICD-10-CM | POA: Diagnosis not present

## 2016-12-25 LAB — URINE DRUG SCREEN, QUALITATIVE (ARMC ONLY)
AMPHETAMINES, UR SCREEN: NOT DETECTED
BARBITURATES, UR SCREEN: NOT DETECTED
BENZODIAZEPINE, UR SCRN: NOT DETECTED
Cannabinoid 50 Ng, Ur ~~LOC~~: POSITIVE — AB
Cocaine Metabolite,Ur ~~LOC~~: POSITIVE — AB
MDMA (Ecstasy)Ur Screen: NOT DETECTED
METHADONE SCREEN, URINE: NOT DETECTED
Opiate, Ur Screen: NOT DETECTED
Phencyclidine (PCP) Ur S: NOT DETECTED
TRICYCLIC, UR SCREEN: NOT DETECTED

## 2016-12-25 LAB — COMPREHENSIVE METABOLIC PANEL
ALBUMIN: 4.3 g/dL (ref 3.5–5.0)
ALT: 25 U/L (ref 17–63)
ANION GAP: 9 (ref 5–15)
AST: 27 U/L (ref 15–41)
Alkaline Phosphatase: 80 U/L (ref 38–126)
BILIRUBIN TOTAL: 0.6 mg/dL (ref 0.3–1.2)
BUN: 14 mg/dL (ref 6–20)
CO2: 24 mmol/L (ref 22–32)
Calcium: 9 mg/dL (ref 8.9–10.3)
Chloride: 109 mmol/L (ref 101–111)
Creatinine, Ser: 0.84 mg/dL (ref 0.61–1.24)
GFR calc non Af Amer: >60 mL/min (ref >60)
GLUCOSE: 79 mg/dL (ref 65–99)
POTASSIUM: 3.7 mmol/L (ref 3.5–5.1)
Sodium: 142 mmol/L (ref 135–145)
TOTAL PROTEIN: 7.4 g/dL (ref 6.5–8.1)

## 2016-12-25 LAB — ACETAMINOPHEN LEVEL: Acetaminophen (Tylenol), Serum: <10 ug/mL — ABNORMAL LOW (ref 10–30)

## 2016-12-25 LAB — CBC
HEMATOCRIT: 42.9 % (ref 40.0–52.0)
Hemoglobin: 14.7 g/dL (ref 13.0–18.0)
MCH: 32.8 pg (ref 26.0–34.0)
MCHC: 34.4 g/dL (ref 32.0–36.0)
MCV: 95.4 fL (ref 80.0–100.0)
PLATELETS: 204 10*3/uL (ref 150–440)
RBC: 4.49 MIL/uL (ref 4.40–5.90)
RDW: 14.8 % — AB (ref 11.5–14.5)
WBC: 10 10*3/uL (ref 3.8–10.6)

## 2016-12-25 LAB — ETHANOL: ALCOHOL ETHYL (B): 149 mg/dL — AB (ref <5)

## 2016-12-25 LAB — SALICYLATE LEVEL: Salicylate Lvl: <7 mg/dL (ref 2.8–30.0)

## 2016-12-25 NOTE — ED Triage Notes (Addendum)
Patient to ED via [facility] under IVC.  Patient states he is depressed and he doesn't want to hurt someone else. Officer states patient told them if they didn't bring him to the hospital he was going to jump off the bridge.

## 2016-12-26 ENCOUNTER — Emergency Department
Admission: EM | Admit: 2016-12-26 | Discharge: 2016-12-26 | Disposition: A | Discharge status: Home / Self Care | Payer: Medicaid Other | Attending: Emergency Medicine | Admitting: Emergency Medicine

## 2016-12-26 DIAGNOSIS — F329 Major depressive disorder, single episode, unspecified: Secondary | ICD-10-CM

## 2016-12-26 DIAGNOSIS — F1010 Alcohol abuse, uncomplicated: Secondary | ICD-10-CM

## 2016-12-26 DIAGNOSIS — F32A Depression, unspecified: Secondary | ICD-10-CM

## 2016-12-26 MED ORDER — LORAZEPAM 1 MG PO TABS: 1.0000 mg | ORAL_TABLET | Freq: Four times a day (QID) | ORAL | Status: DC | PRN

## 2016-12-26 NOTE — ED Notes (Signed)
Patient having SOC assessment. Breakfast tray left at bedside.

## 2016-12-26 NOTE — Progress Notes (Signed)
LCSW provided cab voucher to patient and [facility] nurse. No buses running and no family/friends. Patient reports he is not suicidal or homicidal and will follow up his doctor.  [employer] LCSW [phone]

## 2016-12-26 NOTE — Discharge Instructions (Signed)
Follow up with your provider or see [facility] (information for [facility] on other sheet) Take your medications. Return for any problems.

## 2016-12-26 NOTE — ED Notes (Signed)
Pt given warm blanket and fluids upon request; pt requesting admission to [facility] "I wanna go down there for a while I trust those people"

## 2016-12-26 NOTE — ED Notes (Signed)
Report was received from Nell Range., RN; Pt. Verbalizes no complaints or distress; verbalizes having S.I; as he told the [facility]; that if they did not bring him to the ER; he would jump of the bridge."; denies having Hi; with stating, "I don't want to hurt anyone.". Continue to monitor with 15 min. Monitoring.

## 2016-12-26 NOTE — ED Notes (Signed)

## 2016-12-26 NOTE — ED Provider Notes (Signed)
[facility] Emergency Department Provider Note  ____________________________________________   I have reviewed the triage vital signs and the nursing notes.   HISTORY  Chief Complaint Psychiatric Evaluation   History limited by: Not Limited   HPI Alex [name] is a 41 y.o. male who presents to the emergency department today because of concerns for suicidal ideation. Patient is well-known to the emergency department. States he has been feeling depressed and suicidal recently. He states he had thoughts of jumping off of a bridge. Patient has not followed up with any therapist or psychiatrist since his last emergency department visit which was roughly 2 weeks ago. Patient denies any medical complaints.   Past Medical History:  Diagnosis Date  . Alcohol abuse   . Anxiety   . Cocaine abuse 05/27/2016  . Depression     Patient Active Problem List   Diagnosis Date Noted  . Cocaine abuse   . ADHD 08/11/2016  . Alcohol dependence with unspecified alcohol-induced disorder (HCC) 06/28/2016  . Alcohol withdrawal (HCC) 06/28/2016  . Cocaine use disorder, moderate, dependence (HCC) 06/28/2016  . Unspecified Depressive disorder 06/28/2016  . Tobacco use disorder 06/28/2016    Past Surgical History:  Procedure Laterality Date  . FINGER SURGERY      Prior to Admission medications   Medication Sig Start Date End Date Taking? Authorizing Provider  atomoxetine (STRATTERA) 40 MG capsule Take 1 capsule (40 mg total) by mouth daily. Patient not taking: Reported on 12/08/2016 06/29/16   Jimmy Footman, MD  citalopram (CELEXA) 20 MG tablet Take 40 mg by mouth daily.     Historical Provider, MD  ketorolac (TORADOL) 10 MG tablet Take 10 mg by mouth every 6 (six) hours as needed.    Historical Provider, MD  Multiple Vitamin (MULTIVITAMIN) tablet Take 1 tablet by mouth daily.    Historical Provider, MD  omeprazole (PRILOSEC) 20 MG capsule Take 20 mg by mouth 2  (two) times daily before a meal.    Historical Provider, MD  ondansetron (ZOFRAN) 4 MG tablet Take 4 mg by mouth every 8 (eight) hours as needed for nausea or vomiting.    Historical Provider, MD  oxyCODONE (OXY IR/ROXICODONE) 5 MG immediate release tablet Take 5 mg by mouth every 4 (four) hours as needed for severe pain.    Historical Provider, MD  senna (SENOKOT) 8.6 MG TABS tablet Take 1 tablet by mouth daily as needed for mild constipation.    Historical Provider, MD  traZODone (DESYREL) 150 MG tablet Take by mouth at bedtime.    Historical Provider, MD  valproic acid (DEPAKENE) 250 MG capsule Take 3 capsules (750 mg total) by mouth at bedtime. Patient not taking: Reported on 12/08/2016 11/29/16   Jimmy Footman, MD    Allergies Peanut butter flavor  No family history on file.  Social History Social History  Substance Use Topics  . Smoking status: Current Every Day Smoker    Packs/day: 1.00    Types: Cigarettes  . Smokeless tobacco: Never Used  . Alcohol use 25.2 oz/week    42 Cans of beer per week     Comment: pt admits to drinking 6+ beers a day    Review of Systems  Constitutional: Negative for fever. Cardiovascular: Negative for chest pain. Respiratory: Negative for shortness of breath. Gastrointestinal: Negative for abdominal pain, vomiting and diarrhea. Genitourinary: Negative for dysuria. Musculoskeletal: Negative for back pain. Skin: Negative for rash. Neurological: Negative for headaches, focal weakness or numbness. Psychiatric: Positive for depression/SI  10-point ROS otherwise negative.  ____________________________________________   PHYSICAL EXAM:  VITAL SIGNS: ED Triage Vitals [12/25/16 2137]  Enc Vitals Group     BP 97/69     Pulse Rate 92     Resp (!) 22     Temp 97.9 F (36.6 C)     Temp Source Oral     SpO2 96 %   Constitutional: Alert and oriented. Well appearing and in no distress. Eyes: Conjunctivae are normal. Normal extraocular  movements. ENT   Head: Normocephalic and atraumatic.   Nose: No congestion/rhinnorhea.   Mouth/Throat: Mucous membranes are moist.   Neck: No stridor. Hematological/Lymphatic/Immunilogical: No cervical lymphadenopathy. Cardiovascular: Normal rate, regular rhythm.  No murmurs, rubs, or gallops.  Respiratory: Normal respiratory effort without tachypnea nor retractions. Breath sounds are clear and equal bilaterally. No wheezes/rales/rhonchi. Gastrointestinal: Soft and non tender. No rebound. No guarding.  Genitourinary: Deferred Musculoskeletal: Normal range of motion in all extremities. No lower extremity edema. Neurologic:  Normal speech and language. No gross focal neurologic deficits are appreciated.  Skin:  Skin is warm, dry and intact. No rash noted. Psychiatric: Slightly depressed.  ____________________________________________    LABS (pertinent positives/negatives)  Labs Reviewed  ETHANOL - Abnormal; Notable for the following:       Result Value   Alcohol, Ethyl (B) 149 (*)    All other components within normal limits  ACETAMINOPHEN LEVEL - Abnormal; Notable for the following:    Acetaminophen (Tylenol), Serum <10 (*)    All other components within normal limits  CBC - Abnormal; Notable for the following:    RDW 14.8 (*)    All other components within normal limits  URINE DRUG SCREEN, QUALITATIVE ([facility] ONLY) - Abnormal; Notable for the following:    Cocaine Metabolite,Ur [place] POSITIVE (*)    Cannabinoid 50 Ng, Ur  POSITIVE (*)    All other components within normal limits  COMPREHENSIVE METABOLIC PANEL  SALICYLATE LEVEL   ____________________________________________   EKG  None  ____________________________________________    RADIOLOGY  None  ____________________________________________   PROCEDURES  Procedures  ____________________________________________   INITIAL IMPRESSION / ASSESSMENT AND PLAN / ED COURSE  Pertinent labs & imaging  results that were available during my care of the patient were reviewed by me and considered in my medical decision making (see chart for details).  Patient presented to the emergency department today because of concern for depression and suicidal ideation. Under IVC. Will have patient be seen by psychiatry.   ____________________________________________   FINAL CLINICAL IMPRESSION(S) / ED DIAGNOSES  Final diagnoses:  Depression, unspecified depression type     Note: This dictation was prepared with Dragon dictation. Any transcriptional errors that result from this process are unintentional     Phineas Semen, MD 12/26/16 [phone]

## 2016-12-26 NOTE — ED Notes (Addendum)
Pt transferred to [facility] without incident; pt is A&O x 4, calm and cooperative  Pt two bags of belongings stored in locker room
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
## 2016-12-26 NOTE — ED Notes (Signed)
Patient to be discharged. He denies SI or HI. Discharge instructions reviewed with patient, he verbalizes understanding. Patient will be transported home by cab. Cab voucher arranged by SW and RN called company. Patient received all personal belongings.

## 2016-12-30 ENCOUNTER — Emergency Department
Admission: EM | Admit: 2016-12-30 | Discharge: 2016-12-30 | Disposition: A | Discharge status: Home / Self Care | Payer: Medicaid Other | Attending: Emergency Medicine | Admitting: Emergency Medicine

## 2016-12-30 ENCOUNTER — Encounter: Payer: Self-pay | Admitting: Emergency Medicine

## 2016-12-30 DIAGNOSIS — R45851 Suicidal ideations: Secondary | ICD-10-CM | POA: Diagnosis present

## 2016-12-30 DIAGNOSIS — F17210 Nicotine dependence, cigarettes, uncomplicated: Secondary | ICD-10-CM | POA: Insufficient documentation

## 2016-12-30 DIAGNOSIS — F1994 Other psychoactive substance use, unspecified with psychoactive substance-induced mood disorder: Secondary | ICD-10-CM | POA: Diagnosis not present

## 2016-12-30 DIAGNOSIS — F1410 Cocaine abuse, uncomplicated: Secondary | ICD-10-CM | POA: Diagnosis present

## 2016-12-30 DIAGNOSIS — F329 Major depressive disorder, single episode, unspecified: Secondary | ICD-10-CM | POA: Diagnosis not present

## 2016-12-30 DIAGNOSIS — F32A Depression, unspecified: Secondary | ICD-10-CM

## 2016-12-30 DIAGNOSIS — Z79899 Other long term (current) drug therapy: Secondary | ICD-10-CM | POA: Diagnosis not present

## 2016-12-30 DIAGNOSIS — F1029 Alcohol dependence with unspecified alcohol-induced disorder: Secondary | ICD-10-CM | POA: Diagnosis present

## 2016-12-30 DIAGNOSIS — F909 Attention-deficit hyperactivity disorder, unspecified type: Secondary | ICD-10-CM | POA: Insufficient documentation

## 2016-12-30 DIAGNOSIS — F1910 Other psychoactive substance abuse, uncomplicated: Secondary | ICD-10-CM | POA: Diagnosis not present

## 2016-12-30 DIAGNOSIS — F331 Major depressive disorder, recurrent, moderate: Secondary | ICD-10-CM

## 2016-12-30 LAB — COMPREHENSIVE METABOLIC PANEL
ALBUMIN: 4.2 g/dL (ref 3.5–5.0)
ALT: 20 U/L (ref 17–63)
AST: 27 U/L (ref 15–41)
Alkaline Phosphatase: 82 U/L (ref 38–126)
Anion gap: 9 (ref 5–15)
BUN: 11 mg/dL (ref 6–20)
CO2: 23 mmol/L (ref 22–32)
Calcium: 8.7 mg/dL — ABNORMAL LOW (ref 8.9–10.3)
Chloride: 108 mmol/L (ref 101–111)
Creatinine, Ser: 1.12 mg/dL (ref 0.61–1.24)
GFR calc Af Amer: >60 mL/min (ref >60)
GFR calc non Af Amer: >60 mL/min (ref >60)
GLUCOSE: 90 mg/dL (ref 65–99)
POTASSIUM: 3.8 mmol/L (ref 3.5–5.1)
Sodium: 140 mmol/L (ref 135–145)
TOTAL PROTEIN: 7.7 g/dL (ref 6.5–8.1)
Total Bilirubin: 0.7 mg/dL (ref 0.3–1.2)

## 2016-12-30 LAB — CBC
HEMATOCRIT: 43.1 % (ref 40.0–52.0)
Hemoglobin: 15.1 g/dL (ref 13.0–18.0)
MCH: 33.5 pg (ref 26.0–34.0)
MCHC: 34.9 g/dL (ref 32.0–36.0)
MCV: 95.9 fL (ref 80.0–100.0)
PLATELETS: 177 10*3/uL (ref 150–440)
RBC: 4.5 MIL/uL (ref 4.40–5.90)
RDW: 14.4 % (ref 11.5–14.5)
WBC: 6 10*3/uL (ref 3.8–10.6)

## 2016-12-30 LAB — SALICYLATE LEVEL: Salicylate Lvl: <7 mg/dL (ref 2.8–30.0)

## 2016-12-30 LAB — URINE DRUG SCREEN, QUALITATIVE (ARMC ONLY)
AMPHETAMINES, UR SCREEN: NOT DETECTED
BENZODIAZEPINE, UR SCRN: NOT DETECTED
Barbiturates, Ur Screen: NOT DETECTED
Cannabinoid 50 Ng, Ur ~~LOC~~: POSITIVE — AB
Cocaine Metabolite,Ur ~~LOC~~: POSITIVE — AB
MDMA (Ecstasy)Ur Screen: NOT DETECTED
METHADONE SCREEN, URINE: NOT DETECTED
Opiate, Ur Screen: NOT DETECTED
Phencyclidine (PCP) Ur S: NOT DETECTED
Tricyclic, Ur Screen: NOT DETECTED

## 2016-12-30 LAB — ACETAMINOPHEN LEVEL

## 2016-12-30 LAB — ETHANOL: ALCOHOL ETHYL (B): 198 mg/dL — AB (ref <5)

## 2016-12-30 MED ORDER — CITALOPRAM HYDROBROMIDE 20 MG PO TABS: 40.0000 mg | ORAL_TABLET | Freq: Every day | ORAL | 1 refills | Status: DC

## 2016-12-30 MED ORDER — VALPROIC ACID 250 MG PO CAPS: 750.0000 mg | ORAL_CAPSULE | Freq: Every day | ORAL | 1 refills | Status: DC

## 2016-12-30 MED ORDER — TRAZODONE HCL 150 MG PO TABS: 150.0000 mg | ORAL_TABLET | Freq: Every day | ORAL | 1 refills | Status: DC

## 2016-12-30 NOTE — Discharge Instructions (Signed)
Please follow up with your PMD and psychiatrist as needed.  Return to the ED for any concerns.

## 2016-12-30 NOTE — ED Triage Notes (Signed)
Pt arrived to ed from home by EMS with c/o of N/V and SI thoughts of "jumping off a bridge." Pt sts "I just don't feel good anymore, I just don't care, I just want to jump off of a bridge." Pt admits to ETOH, cocaine and mariajuana. Pt SI at this time.

## 2016-12-30 NOTE — ED Notes (Signed)
Pt stating he is ready to be discharged. Pt will be given cab voucher by TTS.   Maintained on 15 minute checks and observation by security camera for safety.

## 2016-12-30 NOTE — Consult Note (Signed)
[place] Psychiatry Consult   Reason for Consult:  Consult for 41 year old man with a history of mood symptoms and substance abuse came back to the emergency room by EMS yesterday saying he was suicidal Referring Physician:  Rifenbark Patient Identification: Alex [name] MRN:  [id number] Principal Diagnosis: Substance induced mood disorder [facility]) Diagnosis:   Patient Active Problem List   Diagnosis Date Noted  . Substance induced mood disorder ([place]) [F19.94] 12/30/2016  . 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 Cocaine abuse [F14.10]   . ADHD [F90.9] 08/11/2016  . Alcohol dependence with unspecified alcohol-induced disorder ([place]) [F10.29] 06/28/2016  . Alcohol withdrawal ([place]) [F10.239] 06/28/2016  . Cocaine use disorder, moderate, dependence ([place]) [F14.20] 06/28/2016  . Unspecified Depressive disorder [F32.9] 06/28/2016  . Tobacco use disorder [F17.200] 06/28/2016    Total Time spent with patient: 1 hour  Subjective:   Alex [name] is a 41 y.o. male patient admitted with "I'm just feeling sick".  HPI:  Patient interviewed chart reviewed. Patient well known from several prior encounters. 41 year old man with a history of substance abuse had himself brought over here by EMS yesterday after calling them and telling them he was suicidal. Patient said his mood is been feeling bad and worse for the last couple of days. He feels like he doesn't really know what to do with himself and feels hopeless. Sleep as usual is erratic. Self-care is poor. He says he ran out of his Strattera but still has been taking his Celexa and seizure medicine. Patient claims to be drinking "not much" and using cocaine "very little" but his blood alcohol level was over 100 on presentation and he has a drug screen as usual positive for cocaine and marijuana. Patient is not stating an acute intention or wish to die but rather has chronic vague thoughts about jumping off a bridge which she  has not followed up with. He admits that he has not followed up with any outpatient treatment since his last hospital stay.  Social history[place] Says he's been doing a little bit of work recently but not consistently. I believe he was on probation in the past I'm not sure if that is still in effect.  Medical history: Right now he is reporting feeling sick and he looks to me like he might have a viral infection. Otherwise doesn't have much in the way of significant medical problems except for a history of seizures which are probably alcohol withdrawal in nature.  Substance abuse history: Long-standing alcohol and drug abuse mostly with cocaine and marijuana on top of alcohol. Insight has remained poor. Efforts to engage in appropriate treatment have been poor. Never really stays sober even after inpatient stays.  Past Psychiatric History: Patient has presented to the emergency room multiple times in the past almost always under identical circumstances. We have admitted him down stairs more than once. His typical course once he gets into the hospital is to quickly resolve his acute symptoms. He will be cooperative but doesn't seem to be very engaged in any kind of improvement process and he has not followed up with any outpatient treatment or taking care of himself outside the hospital. I think the closest he has come to hurting himself was to once actually climb [place] but he is otherwise as far as I know not made a serious suicide attempts no history of violence.  Risk to Self: Suicidal Ideation: Yes-Currently Present Suicidal Intent: No Is patient at risk for suicide?: No Suicidal Plan?: No Access to Means: Yes Specify Access to Suicidal Means: Access to bridge What has been your use of drugs/alcohol within the last 12 months?: alcohol, cocaine, cannabis How many times?: 2 Other Self Harm Risks: active  addiction Triggers for Past Attempts: Unknown Intentional Self Injurious Behavior: Cutting Comment - Self Injurious Behavior: Hx of superfical cuts  Risk to Others: Homicidal Ideation: No Thoughts of Harm to Others: No Current Homicidal Intent: No Current Homicidal Plan: No Access to Homicidal Means: No History of harm to others?: No Assessment of Violence: None Noted Does patient have access to weapons?: No Criminal Charges Pending?: No Does patient have a court date: No Prior Inpatient Therapy: Prior Inpatient Therapy: Yes Prior Therapy Dates: 11/2016, 06/2016, 05/2016 & 07/2011 Prior Therapy Facilty/Provider(s): [facility], Ellin Mayhew, [facility] Reason for Treatment: Depression and substance use Prior Outpatient Therapy: Prior Outpatient Therapy: Yes Prior Therapy Dates: unsure Prior Therapy Facilty/Provider(s): [facility] Reason for Treatment: Depression and Substance Use Does patient have an ACCT team?: No Does patient have Intensive In-House Services?  : No Does patient have [facility] services? : No Does patient have P4CC services?: No  Past Medical History:  Past Medical History:  Diagnosis Date  . Alcohol abuse   . Anxiety   . Cocaine abuse 05/27/2016  . Depression     Past Surgical History:  Procedure Laterality Date  . FINGER SURGERY     Family History: History reviewed. No pertinent family history. Family Psychiatric  History: Positive for anxiety and depression Social History:  History  Alcohol Use  . 25.2 oz/week  . 79 Cans of beer per week    Comment: pt admits to drinking 6+ beers a day     History  Drug Use  . Types: Marijuana, Cocaine    Social History   Social History  . Marital status: Single    Spouse name: N/A  . Number of children: N/A  . Years of education: N/A   Social History Main Topics  . Smoking status: Current Every Day Smoker    Packs/day: 1.00    Types: Cigarettes  . Smokeless tobacco: Never Used  . Alcohol use 25.2  oz/week    42 Cans of beer per week     Comment: pt admits to drinking 6+ beers a day  . Drug use: Yes    Types: Marijuana, Cocaine  . Sexual activity: Yes    Birth control/ protection: None   Other Topics Concern  . None   Social History Narrative  . None   Additional Social History:    Allergies:   Allergies  Allergen Reactions  . Peanut Butter Flavor     Labs:  Results for orders placed or performed during the hospital encounter of 12/30/16 (from the past 48 hour(s))  Comprehensive metabolic panel     Status: Abnormal   Collection Time: 12/30/16  1:14 AM  Result Value Ref Range   Sodium 140 135 - 145 mmol/L   Potassium 3.8 3.5 - 5.1 mmol/L   Chloride 108 101 - 111 mmol/L   CO2 23 22 - 32 mmol/L   Glucose, Bld 90 65 - 99 mg/dL   BUN 11 6 - 20 mg/dL   Creatinine, Ser 1.12 0.61 - 1.24 mg/dL   Calcium 8.7 (L) 8.9 - 10.3 mg/dL   Total Protein 7.7 6.5 - 8.1 g/dL   Albumin 4.2 3.5 -  5.0 g/dL   AST 27 15 - 41 U/L   ALT 20 17 - 63 U/L   Alkaline Phosphatase 82 38 - 126 U/L   Total Bilirubin 0.7 0.3 - 1.2 mg/dL   GFR calc non Af Amer >60 >60 mL/min   GFR calc Af Amer >60 >60 mL/min    Comment: (NOTE) The eGFR has been calculated using the CKD EPI equation. This calculation has not been validated in all clinical situations. eGFR's persistently <60 mL/min signify possible Chronic Kidney Disease.    Anion gap 9 5 - 15  Ethanol     Status: Abnormal   Collection Time: 12/30/16  1:14 AM  Result Value Ref Range   Alcohol, Ethyl (B) 198 (H) <5 mg/dL    Comment:        LOWEST DETECTABLE LIMIT FOR SERUM ALCOHOL IS 5 mg/dL FOR MEDICAL PURPOSES ONLY   Salicylate level     Status: None   Collection Time: 12/30/16  1:14 AM  Result Value Ref Range   Salicylate Lvl <1.5 2.8 - 30.0 mg/dL  Acetaminophen level     Status: Abnormal   Collection Time: 12/30/16  1:14 AM  Result Value Ref Range   Acetaminophen (Tylenol), Serum <10 (L) 10 - 30 ug/mL    Comment:         THERAPEUTIC CONCENTRATIONS VARY SIGNIFICANTLY. A RANGE OF 10-30 ug/mL MAY BE AN EFFECTIVE CONCENTRATION FOR MANY PATIENTS. HOWEVER, SOME ARE BEST TREATED AT CONCENTRATIONS OUTSIDE THIS RANGE. ACETAMINOPHEN CONCENTRATIONS >150 ug/mL AT 4 HOURS AFTER INGESTION AND >50 ug/mL AT 12 HOURS AFTER INGESTION ARE OFTEN ASSOCIATED WITH TOXIC REACTIONS.   cbc     Status: None   Collection Time: 12/30/16  1:14 AM  Result Value Ref Range   WBC 6.0 3.8 - 10.6 K/uL   RBC 4.50 4.40 - 5.90 MIL/uL   Hemoglobin 15.1 13.0 - 18.0 g/dL   HCT 43.1 40.0 - 52.0 %   MCV 95.9 80.0 - 100.0 fL   MCH 33.5 26.0 - 34.0 pg   MCHC 34.9 32.0 - 36.0 g/dL   RDW 14.4 11.5 - 14.5 %   Platelets 177 150 - 440 K/uL  Urine Drug Screen, Qualitative     Status: Abnormal   Collection Time: 12/30/16  1:14 AM  Result Value Ref Range   Tricyclic, Ur Screen NONE DETECTED NONE DETECTED   Amphetamines, Ur Screen NONE DETECTED NONE DETECTED   MDMA (Ecstasy)Ur Screen NONE DETECTED NONE DETECTED   Cocaine Metabolite,Ur [place] POSITIVE (A) NONE DETECTED   Opiate, Ur Screen NONE DETECTED NONE DETECTED   Phencyclidine (PCP) Ur S NONE DETECTED NONE DETECTED   Cannabinoid 50 Ng, Ur [place] POSITIVE (A) NONE DETECTED   Barbiturates, Ur Screen NONE DETECTED NONE DETECTED   Benzodiazepine, Ur Scrn NONE DETECTED NONE DETECTED   Methadone Scn, Ur NONE DETECTED NONE DETECTED    Comment: (NOTE) 726  Tricyclics, urine               Cutoff 1000 ng/mL 200  Amphetamines, urine             Cutoff 1000 ng/mL 300  MDMA (Ecstasy), urine           Cutoff 500 ng/mL 400  Cocaine Metabolite, urine       Cutoff 300 ng/mL 500  Opiate, urine                   Cutoff 300 ng/mL 600  Phencyclidine (PCP), urine  Cutoff 25 ng/mL 700  Cannabinoid, urine              Cutoff 50 ng/mL 800  Barbiturates, urine             Cutoff 200 ng/mL 900  Benzodiazepine, urine           Cutoff 200 ng/mL 1000 Methadone, urine                Cutoff 300 ng/mL 1100 1200 The  urine drug screen provides only a preliminary, unconfirmed 1300 analytical test result and should not be used for non-medical 1400 purposes. Clinical consideration and professional judgment should 1500 be applied to any positive drug screen result due to possible 1600 interfering substances. A more specific alternate chemical method 1700 must be used in order to obtain a confirmed analytical result.  1800 Gas chromato graphy / mass spectrometry (GC/MS) is the preferred 1900 confirmatory method.     No current facility-administered medications for this encounter.    Current Outpatient Prescriptions  Medication Sig Dispense Refill  . ketorolac (TORADOL) 10 MG tablet Take 10 mg by mouth every 6 (six) hours as needed.    . Multiple Vitamin (MULTIVITAMIN) tablet Take 1 tablet by mouth daily.    Marland Kitchen omeprazole (PRILOSEC) 20 MG capsule Take 20 mg by mouth 2 (two) times daily before a meal.    . ondansetron (ZOFRAN) 4 MG tablet Take 4 mg by mouth every 8 (eight) hours as needed for nausea or vomiting.    Marland Kitchen oxyCODONE (OXY IR/ROXICODONE) 5 MG immediate release tablet Take 5 mg by mouth every 4 (four) hours as needed for severe pain.    Marland Kitchen senna (SENOKOT) 8.6 MG TABS tablet Take 1 tablet by mouth daily as needed for mild constipation.    . traZODone (DESYREL) 150 MG tablet Take by mouth at bedtime.    . valproic acid (DEPAKENE) 250 MG capsule Take 3 capsules (750 mg total) by mouth at bedtime. 90 capsule 0  . citalopram (CELEXA) 20 MG tablet Take 40 mg by mouth daily.       Musculoskeletal: Strength & Muscle Tone: within normal limits Gait & Station: normal Patient leans: N/A  Psychiatric Specialty Exam: Physical Exam  Nursing note and vitals reviewed. Constitutional: He appears well-developed and well-nourished.  HENT:  Head: Normocephalic and atraumatic.  Eyes: Conjunctivae are normal. Pupils are equal, round, and reactive to light.  Neck: Normal range of motion.  Cardiovascular: Regular  rhythm and normal heart sounds.   Respiratory: Effort normal. No respiratory distress.  GI: Soft.  Musculoskeletal: Normal range of motion.  Neurological: He is alert.  Skin: Skin is warm and dry.  Psychiatric: His affect is blunt. His speech is delayed. He is slowed. Thought content is not paranoid. Cognition and memory are normal. He expresses impulsivity. He exhibits a depressed mood. He expresses no homicidal and no suicidal ideation.    Review of Systems  Constitutional: Negative.   HENT: Negative.   Eyes: Negative.   Respiratory: Negative.   Cardiovascular: Negative.   Gastrointestinal: Negative.   Musculoskeletal: Negative.   Skin: Negative.   Neurological: Negative.   Psychiatric/Behavioral: Positive for depression, memory loss, substance abuse and suicidal ideas. Negative for hallucinations. The patient is nervous/anxious and has insomnia.     Blood pressure 122/66, pulse 95, temperature 97.8 F (36.6 C), temperature source Oral, resp. rate 20, height 5' 6"  (1.676 m), weight 86.2 kg (190 lb), SpO2 93 %.Body mass index is 30.67 kg/m.  General Appearance: Disheveled  Eye Contact:  Fair  Speech:  Slow  Volume:  Decreased  Mood:  Dysphoric  Affect:  Constricted  Thought Process:  Goal Directed  Orientation:  Full (Time, Place, and Person)  Thought Content:  Logical  Suicidal Thoughts:  Yes.  without intent/plan  Homicidal Thoughts:  No  Memory:  Immediate;   Fair Recent;   Fair Remote;   Fair  Judgement:  Fair  Insight:  Fair  Psychomotor Activity:  Decreased  Concentration:  Concentration: Fair  Recall:  [employer] of Knowledge:  Fair  Language:  Fair  Akathisia:  No  Handed:  Right  AIMS (if indicated):     Assets:  Housing Resilience Social Support  ADL's:  Intact  Cognition:  Impaired,  Mild  Sleep:        Treatment Plan Summary: Medication management and Plan This is a 41 year old man with a history of substance abuse and mood instability. Came into  the hospital intoxicated with a positive drug screen talking as usual about jumping off a bridge. Patient did not actually do anything to hurt himself. Does not actually have any intention or wish to kill himself right now. Patient has chronic poor functioning and has not been compliant with outpatient treatment and is not likely to benefit from inpatient treatment. I have discontinued his involuntary commitment and recommend that he be released once he is up and stabilized and sobered up and that he be re-referred to [facility] for outpatient treatment. I will try to renew his medications as they were written previously before discharge. Patient agrees to the plan.  Disposition: Patient does not meet criteria for psychiatric inpatient admission. Supportive therapy provided about ongoing stressors.  Alethia Berthold, MD 12/30/2016 12:30 PM

## 2016-12-30 NOTE — ED Notes (Signed)
Pt discharged home with cab voucher provided by TTS.  Pt denies SI. Accepting of disposition.  Belongings given to pt upon discharge.

## 2016-12-30 NOTE — ED Provider Notes (Signed)
[facility] Emergency Department Provider Note  ____________________________________________   First MD Initiated Contact with Patient 12/30/16 [phone]     (approximate)  I have reviewed the triage vital signs and the nursing notes.   HISTORY  Chief Complaint Suicidal and Nausea  Level 5 caveat:  history limited by acute intoxication  HPI Alex [name] is a 41 y.o. male with a history of psychiatric illness and substance abuse who presents by EMS with complaints of nausea, vomiting, and suicidal ideation.  He states he wants to jump off a bridge.  He was seen here about 4 days ago for essentially the same symptoms.  He admits to alcohol, cocaine, and marijuana use.  He states his symptoms are severe and he wants to die.  Nothing makes the symptoms better nor worse.  He is currently sleeping, really appears intoxicated, and is in no acute distress.   Past Medical History:  Diagnosis Date  . Alcohol abuse   . Anxiety   . Cocaine abuse 05/27/2016  . Depression     Patient Active Problem List   Diagnosis Date Noted  . Cocaine abuse   . ADHD 08/11/2016  . Alcohol dependence with unspecified alcohol-induced disorder (HCC) 06/28/2016  . Alcohol withdrawal (HCC) 06/28/2016  . Cocaine use disorder, moderate, dependence (HCC) 06/28/2016  . Unspecified Depressive disorder 06/28/2016  . Tobacco use disorder 06/28/2016    Past Surgical History:  Procedure Laterality Date  . FINGER SURGERY      Prior to Admission medications   Medication Sig Start Date End Date Taking? Authorizing Provider  ketorolac (TORADOL) 10 MG tablet Take 10 mg by mouth every 6 (six) hours as needed.   Yes Historical Provider, MD  Multiple Vitamin (MULTIVITAMIN) tablet Take 1 tablet by mouth daily.   Yes Historical Provider, MD  omeprazole (PRILOSEC) 20 MG capsule Take 20 mg by mouth 2 (two) times daily before a meal.   Yes Historical Provider, MD  ondansetron (ZOFRAN) 4 MG tablet  Take 4 mg by mouth every 8 (eight) hours as needed for nausea or vomiting.   Yes Historical Provider, MD  oxyCODONE (OXY IR/ROXICODONE) 5 MG immediate release tablet Take 5 mg by mouth every 4 (four) hours as needed for severe pain.   Yes Historical Provider, MD  senna (SENOKOT) 8.6 MG TABS tablet Take 1 tablet by mouth daily as needed for mild constipation.   Yes Historical Provider, MD  traZODone (DESYREL) 150 MG tablet Take by mouth at bedtime.   Yes Historical Provider, MD  valproic acid (DEPAKENE) 250 MG capsule Take 3 capsules (750 mg total) by mouth at bedtime. 11/29/16  Yes Jimmy Footman, MD  citalopram (CELEXA) 20 MG tablet Take 40 mg by mouth daily.     Historical Provider, MD    Allergies Peanut butter flavor  History reviewed. No pertinent family history.  Social History Social History  Substance Use Topics  . Smoking status: Current Every Day Smoker    Packs/day: 1.00    Types: Cigarettes  . Smokeless tobacco: Never Used  . Alcohol use 25.2 oz/week    42 Cans of beer per week     Comment: pt admits to drinking 6+ beers a day    Review of Systems L5 caveat: History and review of systems are limited by the patient's acute intoxication and lack of cooperation  ____________________________________________   PHYSICAL EXAM:  VITAL SIGNS: ED Triage Vitals  Enc Vitals Group     BP 12/30/16 0128 121/66  Pulse Rate 12/30/16 0128 (!) 101     Resp 12/30/16 0128 16     Temp 12/30/16 0128 98 F (36.7 C)     Temp Source 12/30/16 0128 Oral     SpO2 12/30/16 0113 99 %     Weight 12/30/16 0113 190 lb (86.2 kg)     Height 12/30/16 0113  (1.676 m)     Head Circumference --      Peak Flow --      Pain Score --      Pain Loc --      Pain Edu? --      Excl. in GC? --     Constitutional: Alert, Somnolent, intoxicated, no acute distress Eyes: Conjunctivae are normal. PERRL. EOMI. Head: Atraumatic. Nose: No congestion/rhinnorhea. Mouth/Throat: Mucous  membranes are moist. Neck: No stridor.  No meningeal signs.   Cardiovascular: Normal rate, regular rhythm. Good peripheral circulation. Grossly normal heart sounds. Respiratory: Normal respiratory effort.  No retractions. Lungs CTAB. Gastrointestinal: Soft and nontender. No distention.  Musculoskeletal: No lower extremity tenderness nor edema. No gross deformities of extremities. Neurologic:  Slurred speech and language. No gross focal neurologic deficits are appreciated.  Skin:  Skin is warm, dry and intact. No rash noted. Psychiatric: Endorses polysubstance abuse and SI  ____________________________________________   LABS (all labs ordered are listed, but only abnormal results are displayed)  Labs Reviewed  COMPREHENSIVE METABOLIC PANEL - Abnormal; Notable for the following:       Result Value   Calcium 8.7 (*)    All other components within normal limits  ETHANOL - Abnormal; Notable for the following:    Alcohol, Ethyl (B) 198 (*)    All other components within normal limits  ACETAMINOPHEN LEVEL - Abnormal; Notable for the following:    Acetaminophen (Tylenol), Serum <10 (*)    All other components within normal limits  URINE DRUG SCREEN, QUALITATIVE ([facility] ONLY) - Abnormal; Notable for the following:    Cocaine Metabolite,Ur [place] POSITIVE (*)    Cannabinoid 50 Ng, Ur [place] POSITIVE (*)    All other components within normal limits  SALICYLATE LEVEL  CBC   ____________________________________________  EKG  None - EKG not ordered by ED physician ____________________________________________  RADIOLOGY   No results found.  ____________________________________________   PROCEDURES  Critical Care performed: No   Procedure(s) performed:   Procedures   ____________________________________________   INITIAL IMPRESSION / ASSESSMENT AND PLAN / ED COURSE  Pertinent labs & imaging results that were available during my care of the patient were reviewed by me and  considered in my medical decision making (see chart for details).  The patient is acutely intoxicated with multiple substances and endorsing suicidal ideation.  No acute medical issues at this time.  Involuntary commitment, TTS and psychiatric consults.      ____________________________________________  FINAL CLINICAL IMPRESSION(S) / ED DIAGNOSES  Final diagnoses:  Suicidal ideation  Depression, unspecified depression type  Polysubstance abuse     MEDICATIONS GIVEN DURING THIS VISIT:  Medications - No data to display   NEW OUTPATIENT MEDICATIONS STARTED DURING THIS VISIT:  New Prescriptions   No medications on file    Modified Medications   No medications on file    Discontinued Medications   No medications on file     Note:  This document was prepared using Dragon voice recognition software and may include unintentional dictation errors.    Loleta Rose, MD 12/30/16 [phone]

## 2016-12-30 NOTE — ED Notes (Signed)
Pt endorses SI. Denies HI and AVH. Pt stated he is not feeling well (nauseous, cough). MD made aware.   Pt to be discharge home this afternoon.   Maintained on 15 minute checks and observation by security camera for safety.

## 2016-12-30 NOTE — ED Notes (Signed)
Patient asleep in room. No noted distress or abnormal behavior. Will continue 15 minute checks and observation by security cameras for safety. 

## 2016-12-30 NOTE — BH Assessment (Signed)
Assessment Note  Alex [name] is an 41 y.o. male presenting to the ED with complaints of nausea/vomiting and suicidal thoughts of jumping off a bridge.  Pt has had similar presentations to the ED for the same, with the last visit on 12/26/2016.  Pt does not follow thru with outpatient recommendations.  Evaluation limited due to patient's intoxications.  BAC 198 and UDS positive for cocaine and cannabis.  Diagnosis: Polysubstance Abuse.  Past Medical History:  Past Medical History:  Diagnosis Date  . Alcohol abuse   . Anxiety   . Cocaine abuse 05/27/2016  . Depression     Past Surgical History:  Procedure Laterality Date  . FINGER SURGERY      Family History: History reviewed. No pertinent family history.  Social History:  reports that he has been smoking Cigarettes.  He has been smoking about 1.00 pack per day. He has never used smokeless tobacco. He reports that he drinks about 25.2 oz of alcohol per week . He reports that he uses drugs, including Marijuana and Cocaine.  Additional Social History:  Alcohol / Drug Use Pain Medications: See PTA Prescriptions: See PTA Over the Counter: See PTA Negative Consequences of Use: Personal relationships Withdrawal Symptoms: Agitation, Irritability, Nausea / Vomiting Substance #1 Name of Substance 1: ETOH Substance #2 Name of Substance 2: Cannabis Substance #3 Name of Substance 3: cocaine  CIWA: CIWA-Ar BP: 121/66 Pulse Rate: (!) 101 COWS:    Allergies:  Allergies  Allergen Reactions  . Peanut Butter Flavor     Home Medications:  (Not in a hospital admission)  OB/GYN Status:  No LMP for male patient.  General Assessment Data Location of Assessment: [facility] ED TTS Assessment: In system Is this a Tele or Face-to-Face Assessment?: Face-to-Face Is this an Initial Assessment or a Re-assessment for this encounter?: Initial Assessment Marital status: Single Maiden name: na Is patient pregnant?: No Pregnancy Status: No Living  Arrangements: Parent Can pt return to current living arrangement?: Yes Admission Status: Voluntary Is patient capable of signing voluntary admission?: Yes Referral Source: Self/Family/Friend Insurance type: Medicaid     Crisis Care Plan Living Arrangements: Parent Legal Guardian: Other: (self) Name of Psychiatrist: none Name of Therapist: none  Education Status Is patient currently in school?: No Current Grade: na Highest grade of school patient has completed: na Name of school: na Contact person: na  Risk to self with the past 6 months Suicidal Ideation: Yes-Currently Present Has patient been a risk to self within the past 6 months prior to admission? : Yes Suicidal Intent: No Has patient had any suicidal intent within the past 6 months prior to admission? : Yes Is patient at risk for suicide?: No Suicidal Plan?: No Has patient had any suicidal plan within the past 6 months prior to admission? : Yes Access to Means: Yes Specify Access to Suicidal Means: Access to bridge What has been your use of drugs/alcohol within the last 12 months?: alcohol, cocaine, cannabis Previous Attempts/Gestures: Yes How many times?: 2 Other Self Harm Risks: active addiction Triggers for Past Attempts: Unknown Intentional Self Injurious Behavior: Cutting Comment - Self Injurious Behavior: Hx of superfical cuts  Family Suicide History: No Recent stressful life event(s): Conflict (Comment) Persecutory voices/beliefs?: No Depression: Yes Depression Symptoms: Loss of interest in usual pleasures, Feeling worthless/self pity, Feeling angry/irritable Substance abuse history and/or treatment for substance abuse?: Yes Suicide prevention information given to non-admitted patients: Not applicable  Risk to Others within the past 6 months Homicidal Ideation: No Does  patient have any lifetime risk of violence toward others beyond the six months prior to admission? : No Thoughts of Harm to Others:  No Current Homicidal Intent: No Current Homicidal Plan: No Access to Homicidal Means: No History of harm to others?: No Assessment of Violence: None Noted Does patient have access to weapons?: No Criminal Charges Pending?: No Does patient have a court date: No Is patient on probation?: Yes  Psychosis Hallucinations: None noted Delusions: None noted  Mental Status Report Appearance/Hygiene: Unremarkable, In scrubs Eye Contact: Poor Motor Activity: Freedom of movement Speech: Logical/coherent Level of Consciousness: Drowsy Mood: Depressed Affect: Appropriate to circumstance Anxiety Level: None Thought Processes: Relevant Judgement: Impaired Orientation: Person, Place, Time, Situation Obsessive Compulsive Thoughts/Behaviors: None  Cognitive Functioning Concentration: Good Memory: Recent Intact, Remote Intact IQ: Average Insight: Poor Impulse Control: Poor Appetite: Good Sleep: Decreased Vegetative Symptoms: None  ADLScreening [facility] Assessment Services) Patient's cognitive ability adequate to safely complete daily activities?: Yes Patient able to express need for assistance with ADLs?: Yes Independently performs ADLs?: Yes (appropriate for developmental age)  Prior Inpatient Therapy Prior Inpatient Therapy: Yes Prior Therapy Dates: 11/2016, 06/2016, 05/2016 & 07/2011 Prior Therapy Facilty/Provider(s): [facility], [facility] Coder, [facility] Reason for Treatment: Depression and substance use  Prior Outpatient Therapy Prior Outpatient Therapy: Yes Prior Therapy Dates: unsure Prior Therapy Facilty/Provider(s): [facility] Reason for Treatment: Depression and Substance Use Does patient have an ACCT team?: No Does patient have Intensive In-House Services?  : No Does patient have [facility] services? : No Does patient have P4CC services?: No  ADL Screening (condition at time of admission) Patient's cognitive ability adequate to safely complete daily activities?:  Yes Patient able to express need for assistance with ADLs?: Yes Independently performs ADLs?: Yes (appropriate for developmental age)       Abuse/Neglect Assessment (Assessment to be complete while patient is alone) Physical Abuse: Denies Verbal Abuse: Denies Sexual Abuse: Denies Exploitation of patient/patient's resources: Denies Self-Neglect: Denies Values / Beliefs Cultural Requests During Hospitalization: None Spiritual Requests During Hospitalization: None Consults Spiritual Care Consult Needed: No Social Work Consult Needed: No      Additional Information 1:1 In Past 12 Months?: No CIRT Risk: No Elopement Risk: No Does patient have medical clearance?: Yes     Disposition:  Disposition Initial Assessment Completed for this Encounter: Yes Disposition of Patient: Other dispositions Other disposition(s): Other (Comment) (Pending Pych Md consult)  On Site Evaluation by:   Reviewed with Physician:    [place] 12/30/2016 4:53 AM

## 2017-01-15 ENCOUNTER — Encounter: Payer: Self-pay | Admitting: Emergency Medicine

## 2017-01-15 ENCOUNTER — Emergency Department
Admission: EM | Admit: 2017-01-15 | Discharge: 2017-01-17 | Disposition: A | Discharge status: Home / Self Care | Payer: Medicaid Other | Attending: Emergency Medicine | Admitting: Emergency Medicine

## 2017-01-15 DIAGNOSIS — F331 Major depressive disorder, recurrent, moderate: Secondary | ICD-10-CM | POA: Diagnosis present

## 2017-01-15 DIAGNOSIS — F329 Major depressive disorder, single episode, unspecified: Secondary | ICD-10-CM | POA: Diagnosis not present

## 2017-01-15 DIAGNOSIS — F1994 Other psychoactive substance use, unspecified with psychoactive substance-induced mood disorder: Secondary | ICD-10-CM | POA: Diagnosis not present

## 2017-01-15 DIAGNOSIS — F909 Attention-deficit hyperactivity disorder, unspecified type: Secondary | ICD-10-CM | POA: Insufficient documentation

## 2017-01-15 DIAGNOSIS — Z79899 Other long term (current) drug therapy: Secondary | ICD-10-CM | POA: Diagnosis not present

## 2017-01-15 DIAGNOSIS — R45851 Suicidal ideations: Secondary | ICD-10-CM | POA: Diagnosis present

## 2017-01-15 DIAGNOSIS — F17210 Nicotine dependence, cigarettes, uncomplicated: Secondary | ICD-10-CM | POA: Insufficient documentation

## 2017-01-15 DIAGNOSIS — F1410 Cocaine abuse, uncomplicated: Secondary | ICD-10-CM | POA: Diagnosis present

## 2017-01-15 DIAGNOSIS — F1029 Alcohol dependence with unspecified alcohol-induced disorder: Secondary | ICD-10-CM | POA: Diagnosis present

## 2017-01-15 LAB — CBC
HCT: 47.8 % (ref 40.0–52.0)
HEMOGLOBIN: 16.5 g/dL (ref 13.0–18.0)
MCH: 33 pg (ref 26.0–34.0)
MCHC: 34.6 g/dL (ref 32.0–36.0)
MCV: 95.4 fL (ref 80.0–100.0)
PLATELETS: 202 10*3/uL (ref 150–440)
RBC: 5.01 MIL/uL (ref 4.40–5.90)
RDW: 14.4 % (ref 11.5–14.5)
WBC: 10.8 10*3/uL — AB (ref 3.8–10.6)

## 2017-01-15 LAB — COMPREHENSIVE METABOLIC PANEL
ALT: 15 U/L — AB (ref 17–63)
AST: 26 U/L (ref 15–41)
Albumin: 4.6 g/dL (ref 3.5–5.0)
Alkaline Phosphatase: 95 U/L (ref 38–126)
Anion gap: 13 (ref 5–15)
BUN: 10 mg/dL (ref 6–20)
CHLORIDE: 96 mmol/L — AB (ref 101–111)
CO2: 22 mmol/L (ref 22–32)
CREATININE: 0.67 mg/dL (ref 0.61–1.24)
Calcium: 8.9 mg/dL (ref 8.9–10.3)
GFR calc non Af Amer: >60 mL/min (ref >60)
Glucose, Bld: 87 mg/dL (ref 65–99)
POTASSIUM: 3.6 mmol/L (ref 3.5–5.1)
SODIUM: 131 mmol/L — AB (ref 135–145)
Total Bilirubin: 0.8 mg/dL (ref 0.3–1.2)
Total Protein: 8.2 g/dL — ABNORMAL HIGH (ref 6.5–8.1)

## 2017-01-15 LAB — URINE DRUG SCREEN, QUALITATIVE (ARMC ONLY)
Amphetamines, Ur Screen: NOT DETECTED
BARBITURATES, UR SCREEN: NOT DETECTED
BENZODIAZEPINE, UR SCRN: NOT DETECTED
Cannabinoid 50 Ng, Ur ~~LOC~~: POSITIVE — AB
Cocaine Metabolite,Ur ~~LOC~~: POSITIVE — AB
MDMA (Ecstasy)Ur Screen: NOT DETECTED
Methadone Scn, Ur: NOT DETECTED
Opiate, Ur Screen: NOT DETECTED
PHENCYCLIDINE (PCP) UR S: NOT DETECTED
TRICYCLIC, UR SCREEN: NOT DETECTED

## 2017-01-15 LAB — ACETAMINOPHEN LEVEL

## 2017-01-15 LAB — SALICYLATE LEVEL

## 2017-01-15 LAB — ETHANOL: ALCOHOL ETHYL (B): 212 mg/dL — AB (ref <5)

## 2017-01-15 NOTE — ED Provider Notes (Signed)
[facility] Emergency Department Provider Note       Time seen: ----------------------------------------- 10:47 PM on 01/15/2017 -----------------------------------------     I have reviewed the triage vital signs and the nursing notes.   HISTORY   Chief Complaint Suicidal    HPI Alex [name] is a 41 y.o. male who presents to the ED for involuntary commitment. Patient is brought in by [facility] police department. Ofc. states the patient had to be talked down off the bridge and he was going to jump off the bridge and committed suicide. Patient admits to alcohol and cocaine today. Patient states every time he comes in here he does not get any help any she is ready to die.   Past Medical History:  Diagnosis Date  . Alcohol abuse   . Anxiety   . Cocaine abuse 05/27/2016  . Depression     Patient Active Problem List   Diagnosis Date Noted  . Substance induced mood disorder (HCC) 12/30/2016  . Moderate recurrent major depression (HCC) 12/30/2016  . Cocaine abuse   . ADHD 08/11/2016  . Alcohol dependence with unspecified alcohol-induced disorder (HCC) 06/28/2016  . Alcohol withdrawal (HCC) 06/28/2016  . Cocaine use disorder, moderate, dependence (HCC) 06/28/2016  . Unspecified Depressive disorder 06/28/2016  . Tobacco use disorder 06/28/2016    Past Surgical History:  Procedure Laterality Date  . FINGER SURGERY      Allergies Peanut butter flavor  Social History Social History  Substance Use Topics  . Smoking status: Current Every Day Smoker    Packs/day: 1.00    Types: Cigarettes  . Smokeless tobacco: Never Used  . Alcohol use 25.2 oz/week    42 Cans of beer per week     Comment: pt admits to drinking 6+ beers a day    Review of Systems Constitutional: Negative for fever. Cardiovascular: Negative for chest pain. Respiratory: Negative for shortness of breath. Gastrointestinal: Negative for abdominal pain, vomiting and  diarrhea. Neurological: Negative for headaches, focal weakness or numbness. Psychiatric: Positive for suicidal ideation, substance abuse  10-point ROS otherwise negative.  ____________________________________________   PHYSICAL EXAM:  VITAL SIGNS: ED Triage Vitals  Enc Vitals Group     BP 01/15/17 2234 (!) 127/93     Pulse Rate 01/15/17 2234 95     Resp 01/15/17 2234 18     Temp 01/15/17 2234 98.5 F (36.9 C)     Temp Source 01/15/17 2234 Oral     SpO2 01/15/17 2234 95 %     Weight 01/15/17 2232 190 lb (86.2 kg)     Height 01/15/17 2232  (1.676 m)     Head Circumference --      Peak Flow --      Pain Score 01/15/17 2232 0     Pain Loc --      Pain Edu? --      Excl. in GC? --     Constitutional: Alert and oriented. Well appearing and in no distress. Eyes: Conjunctivae are normal. PERRL. Normal extraocular movements. ENT   Head: Normocephalic and atraumatic.   Nose: No congestion/rhinnorhea.   Mouth/Throat: Mucous membranes are moist.   Neck: No stridor. Cardiovascular: Normal rate, regular rhythm. No murmurs, rubs, or gallops. Respiratory: Normal respiratory effort without tachypnea nor retractions. Breath sounds are clear and equal bilaterally. No wheezes/rales/rhonchi. Gastrointestinal: Soft and nontender. Normal bowel sounds Musculoskeletal: Nontender with normal range of motion in extremities. No lower extremity tenderness nor edema. Neurologic:  Normal speech  and language. No gross focal neurologic deficits are appreciated.  Skin:  Skin is warm, dry and intact. No rash noted. Psychiatric: Mood and affect are normal. Speech and behavior are normal.  ____________________________________________  ED COURSE:  Pertinent labs & imaging results that were available during my care of the patient were reviewed by me and considered in my medical decision making (see chart for details). Patient presents for Suicidal ideation under involuntary commitment, we  will assess with labs and imaging as indicated.   Procedures ____________________________________________   LABS (pertinent positives/negatives)  Labs Reviewed  COMPREHENSIVE METABOLIC PANEL  ETHANOL  SALICYLATE LEVEL  ACETAMINOPHEN LEVEL  CBC  URINE DRUG SCREEN, QUALITATIVE ([facility] ONLY)   ____________________________________________  FINAL ASSESSMENT AND PLAN  Suicidal ideation  Plan: Patient's labs were dictated above. Patient had presented for suicidal ideation, he is medically stable for psychiatric evaluation and disposition.   Emily Filbert, MD   Note: This note was generated in part or whole with voice recognition software. Voice recognition is usually quite accurate but there are transcription errors that can and very often do occur. I apologize for any typographical errors that were not detected and corrected.     Emily Filbert, MD 01/15/17 2249

## 2017-01-15 NOTE — ED Notes (Signed)
Dr. Mayford Knife at pt bedside at this time

## 2017-01-15 NOTE — ED Notes (Signed)
SOC at bedside. 

## 2017-01-15 NOTE — ED Triage Notes (Signed)
Pt brought in by [facility]; IVC papers just arrived; officer says pt had to be talked down off the bridge, was going to jump in attempt to commit suicide; pt admits to alcohol and cocaine today; pt says every time he comes here he doesn't get any help and he's just ready to die; pt laughing and talking with officers in triage;

## 2017-01-15 NOTE — ED Notes (Signed)

## 2017-01-15 NOTE — ED Notes (Signed)
Patient given food bag and ginger ale

## 2017-01-15 NOTE — ED Notes (Signed)
[facility] provider called me for patient update prior to [facility] consult

## 2017-01-15 NOTE — ED Notes (Signed)
Patient dressed out by this RN, patient placed his phone and cigarettes/lighter into his patient belonging bag

## 2017-01-16 MED ORDER — CITALOPRAM HYDROBROMIDE 20 MG PO TABS
40.0000 mg | ORAL_TABLET | Freq: Every day | ORAL | Status: DC
  Administered 2017-01-16 – 2017-01-17 (×2): 40 mg via ORAL
  Filled 2017-01-16 (×2): qty 2

## 2017-01-16 MED ORDER — LORAZEPAM 2 MG PO TABS
2.0000 mg | ORAL_TABLET | Freq: Four times a day (QID) | ORAL | Status: DC | PRN
  Administered 2017-01-16: 2 mg via ORAL
  Filled 2017-01-16: qty 1

## 2017-01-16 MED ORDER — VALPROIC ACID 250 MG PO CAPS
750.0000 mg | ORAL_CAPSULE | Freq: Every day | ORAL | Status: DC
  Administered 2017-01-16: 750 mg via ORAL
  Filled 2017-01-16 (×3): qty 3

## 2017-01-16 MED ORDER — TRAZODONE HCL 50 MG PO TABS
150.0000 mg | ORAL_TABLET | Freq: Every day | ORAL | Status: DC
  Administered 2017-01-16: 150 mg via ORAL
  Filled 2017-01-16: qty 1

## 2017-01-16 MED ORDER — NICOTINE 21 MG/24HR TD PT24
21.0000 mg | MEDICATED_PATCH | Freq: Every day | TRANSDERMAL | Status: DC
  Administered 2017-01-16 – 2017-01-17 (×2): 21 mg via TRANSDERMAL
  Filled 2017-01-16 (×2): qty 1

## 2017-01-16 MED ORDER — PANTOPRAZOLE SODIUM 40 MG PO TBEC
40.0000 mg | DELAYED_RELEASE_TABLET | Freq: Every day | ORAL | Status: DC
  Administered 2017-01-16 – 2017-01-17 (×2): 40 mg via ORAL
  Filled 2017-01-16 (×2): qty 1

## 2017-01-16 MED ORDER — SENNA 8.6 MG PO TABS
1.0000 | ORAL_TABLET | Freq: Every day | ORAL | Status: DC | PRN
  Filled 2017-01-16: qty 1

## 2017-01-16 NOTE — ED Notes (Signed)
Patient insisting he needs his "seizure" medication. Medical records and medication history reviewed by RN and MD. No documentation of any seizure disorder. SOC recommends medication management should be deferred to inpatient primary team.  Ativan ordered as PRN for any agitation or ETOH withdrawal symptons.

## 2017-01-16 NOTE — ED Notes (Signed)
Snack given.

## 2017-01-16 NOTE — ED Notes (Signed)
Patient resting quietly in room. No noted distress or abnormal behaviors noted. Will continue 15 minute checks and observation by security camera for safety. 

## 2017-01-16 NOTE — ED Notes (Signed)

## 2017-01-16 NOTE — ED Notes (Signed)
Patient received meal tray 

## 2017-01-16 NOTE — BH Assessment (Signed)
Assessment Note  Alex [name] is an 41 y.o. male, with a history of depression, polysubstance use disorder and bipolar disorder, presenting to the ED after being found by the police attempting to jump off a bridge.  Pt has had multiple presentations to the ED for the same.  Pt also presents to the ED intoxicated.  Pt BAC 212 and UDS + for cocaine and cannabis.  Pt states "I'm tired and can't go on.  I need help".  Pt reports he sees a psychiatrist at [facility] but does not follow up on a regular basis.  Diagnosis: Depressive Disorder  Past Medical History:  Past Medical History:  Diagnosis Date  . Alcohol abuse   . Anxiety   . Cocaine abuse 05/27/2016  . Depression     Past Surgical History:  Procedure Laterality Date  . FINGER SURGERY      Family History: History reviewed. No pertinent family history.  Social History:  reports that he has been smoking Cigarettes.  He has been smoking about 1.00 pack per day. He has never used smokeless tobacco. He reports that he drinks about 25.2 oz of alcohol per week . He reports that he uses drugs, including Marijuana and Cocaine.  Additional Social History:  Alcohol / Drug Use Pain Medications: See PTA Prescriptions: See PTA Over the Counter: See PTA History of alcohol / drug use?: Yes Longest period of sobriety (when/how long): can't remember Negative Consequences of Use: Financial, Legal, Personal relationships Withdrawal Symptoms: Agitation, Irritability  CIWA: CIWA-Ar BP: (!) 127/93 Pulse Rate: 95 COWS:    Allergies:  Allergies  Allergen Reactions  . Peanut Butter Flavor     Home Medications:  (Not in a hospital admission)  OB/GYN Status:  No LMP for male patient.  General Assessment Data Location of Assessment: [facility] ED TTS Assessment: In system Is this a Tele or Face-to-Face Assessment?: Face-to-Face Is this an Initial Assessment or a Re-assessment for this encounter?: Initial Assessment Marital status: Single Maiden  name: na Is patient pregnant?: No Pregnancy Status: No Living Arrangements: Parent Can pt return to current living arrangement?: Yes Admission Status: Involuntary Is patient capable of signing voluntary admission?: Yes Referral Source: Other Surveyor, quantity) Insurance type: Medicaid  Medical Screening Exam [facility] Walk-in ONLY) Medical Exam completed: Yes  Crisis Care Plan Living Arrangements: Parent Legal Guardian: Other: (self) Name of Psychiatrist: none Name of Therapist: none  Education Status Is patient currently in school?: No Current Grade: na Highest grade of school patient has completed: na Name of school: na Contact person: na  Risk to self with the past 6 months Suicidal Ideation: Yes-Currently Present Has patient been a risk to self within the past 6 months prior to admission? : Yes Suicidal Intent: Yes-Currently Present Has patient had any suicidal intent within the past 6 months prior to admission? : Yes Is patient at risk for suicide?: Yes Suicidal Plan?: Yes-Currently Present Has patient had any suicidal plan within the past 6 months prior to admission? : Yes Specify Current Suicidal Plan: Jump off bridge Access to Means: Yes Specify Access to Suicidal Means: access to bridge What has been your use of drugs/alcohol within the last 12 months?: alcohol, cocaine, marijuana Previous Attempts/Gestures: Yes How many times?: 4 Other Self Harm Risks: active addiction Triggers for Past Attempts: Unknown Intentional Self Injurious Behavior: None Family Suicide History: No Recent stressful life event(s): Financial Problems Persecutory voices/beliefs?: No Depression: Yes Depression Symptoms: Despondent, Loss of interest in usual pleasures, Feeling worthless/self pity Substance abuse  history and/or treatment for substance abuse?: Yes Suicide prevention information given to non-admitted patients: Not applicable  Risk to Others within the past 6 months Homicidal Ideation:  No Does patient have any lifetime risk of violence toward others beyond the six months prior to admission? : No Thoughts of Harm to Others: No Current Homicidal Intent: No Current Homicidal Plan: No Access to Homicidal Means: No Identified Victim: None identified History of harm to others?: No Assessment of Violence: None Noted Does patient have access to weapons?: No Criminal Charges Pending?: No Does patient have a court date: No Is patient on probation?: Yes  Psychosis Hallucinations: None noted Delusions: None noted  Mental Status Report Appearance/Hygiene: Unremarkable, In scrubs Eye Contact: Fair Motor Activity: Freedom of movement Speech: Logical/coherent Level of Consciousness: Drowsy Mood: Depressed Affect: Appropriate to circumstance Anxiety Level: None Thought Processes: Relevant Judgement: Impaired Orientation: Person, Place, Time, Situation Obsessive Compulsive Thoughts/Behaviors: None  Cognitive Functioning Concentration: Normal Memory: Recent Intact, Remote Intact IQ: Average Insight: Poor Impulse Control: Poor Appetite: Good Weight Loss: 0 Weight Gain: 0 Sleep: No Change Total Hours of Sleep: 8 Vegetative Symptoms: None  ADLScreening [facility] Assessment Services) Patient's cognitive ability adequate to safely complete daily activities?: Yes Patient able to express need for assistance with ADLs?: Yes Independently performs ADLs?: Yes (appropriate for developmental age)  Prior Inpatient Therapy Prior Inpatient Therapy: Yes Prior Therapy Dates: 11/2016, 06/2016, 05/2016 & 07/2011 Prior Therapy Facilty/Provider(s): [facility], [facility], [facility] Reason for Treatment: Depression and substance use  Prior Outpatient Therapy Prior Outpatient Therapy: Yes Prior Therapy Dates: unsure Prior Therapy Facilty/Provider(s): [facility] Reason for Treatment: Depression and Substance Use Does patient have an ACCT team?: No Does patient have Intensive  In-House Services?  : No Does patient have [facility] services? : No Does patient have P4CC services?: No  ADL Screening (condition at time of admission) Patient's cognitive ability adequate to safely complete daily activities?: Yes Patient able to express need for assistance with ADLs?: Yes Independently performs ADLs?: Yes (appropriate for developmental age)       Abuse/Neglect Assessment (Assessment to be complete while patient is alone) Physical Abuse: Denies Verbal Abuse: Denies Sexual Abuse: Denies Exploitation of patient/patient's resources: Denies Self-Neglect: Denies Values / Beliefs Cultural Requests During Hospitalization: None Spiritual Requests During Hospitalization: None Consults Spiritual Care Consult Needed: No Social Work Consult Needed: No Merchant navy officer (For Healthcare) Does Patient Have a Medical Advance Directive?: No Would patient like information on creating a medical advance directive?: No - Patient declined    Additional Information 1:1 In Past 12 Months?: No CIRT Risk: No Elopement Risk: No Does patient have medical clearance?: Yes     Disposition:  Disposition Initial Assessment Completed for this Encounter: Yes Disposition of Patient: Inpatient treatment program Type of inpatient treatment program: Adult Other disposition(s): Other (Comment) (Pending Pych Md consult)  On Site Evaluation by:   Reviewed with Physician:    Graylin Sperling C Sheletha Bow 01/16/2017 1:01 AM

## 2017-01-16 NOTE — ED Notes (Signed)

## 2017-01-16 NOTE — ED Provider Notes (Signed)
-----------------------------------------   4:43 AM on 01/16/2017 -----------------------------------------   Blood pressure 117/66, pulse 77, temperature 98.4 F (36.9 C), temperature source Oral, resp. rate 20, height  (1.676 m), weight 190 lb (86.2 kg), SpO2 96 %.  The patient had no acute events since last update.  Calm and cooperative at this time.  Disposition is pending Psychiatry/Behavioral Medicine team recommendations.     Arnaldo Natal, MD 01/16/17 [phone]

## 2017-01-16 NOTE — ED Notes (Signed)

## 2017-01-16 NOTE — ED Notes (Signed)
Patient asleep in room. No noted distress or abnormal behavior. Will continue 15 minute checks and observation by security cameras for safety. 

## 2017-01-16 NOTE — ED Notes (Signed)
Breakfast tray left at bedside. 

## 2017-01-16 NOTE — BH Assessment (Signed)
Referral information for Psychiatric Hospitalization faxed to;    [facility] [phone]),   [facility] [phone]),    [facility] [phone]),    [address] [phone]),    [facility] [phone])   [facility] [phone]),    [facility] [phone]),    [facility] [phone]),   [facility].

## 2017-01-16 NOTE — ED Notes (Signed)
SOC recommended inpatient tx for this patient

## 2017-01-16 NOTE — ED Notes (Signed)
Report was received from Alex [name]., RN; Pt. Verbalizes  complaints of having Depression; distress; and verbalizes having S.I.; with a plan to jump off of a bridge; denies having Hi. Continue to monitor with 15 min. Monitoring.

## 2017-01-16 NOTE — ED Notes (Signed)
Maintained on 15 minute checks and observation by security camera for safety. 

## 2017-01-16 NOTE — ED Notes (Signed)
Patient awake, alert, and oriented. Cooperative with nursing assessment. Mood is sad with congruent affect. Patient states he has "lots of problems" and his medications are not working. He feels he needs to be in the hospital.  Patient received meal tray. He was given supplies for shower. Maintained on 15 minute checks and observation by security camera for safety.

## 2017-01-16 NOTE — ED Notes (Signed)

## 2017-01-17 DIAGNOSIS — F1994 Other psychoactive substance use, unspecified with psychoactive substance-induced mood disorder: Secondary | ICD-10-CM | POA: Diagnosis not present

## 2017-01-17 NOTE — BH Assessment (Signed)
Writer received phone call from patient's [facility] Coordinator Laureen Abrahams [phone]) requesting an update. Writer informed her he would have to call her back.  Writer contacted [employer] provider line ([phone]) to confirm the Care Coordinator was an employee with them.   Writer followed up with [facility], updated her on patient's current plan. Coordinator states, the patient hasn't followed up with outpatient treatment since his last hospitalization. He went to one appointment and didn't follow up with it.

## 2017-01-17 NOTE — ED Notes (Signed)
Pt getting agitated, wanting to leave because he is afraid he will miss the bus.  Pt reassured the MD is working on his discharge. Maintained on 15 minute checks and observation by security camera for safety.

## 2017-01-17 NOTE — ED Notes (Signed)
Pt to be discharged home.  RN spoke with SW to obtain a bus ticket for patient to return home.  RN will follow up. Pt accepting.

## 2017-01-17 NOTE — ED Provider Notes (Signed)
-----------------------------------------   6:42 AM on 01/17/2017 -----------------------------------------   Blood pressure 121/87, pulse 81, temperature 97.8 F (36.6 C), temperature source Oral, resp. rate 18, height  (1.676 m), weight 190 lb (86.2 kg), SpO2 96 %.  The patient had no acute events since last update.  Calm and cooperative at this time.  Disposition is pending Psychiatry/Behavioral Medicine team recommendations.     Irean Hong, MD 01/17/17 [phone]

## 2017-01-17 NOTE — ED Notes (Signed)
Pt pleasant and talkative with this Clinical research associate. Pt denies SI currently. Denies HI and AVH. Pt stated he is under financial stress and had to borrow money to buy one of his prescriptions. Still lives with his mother.  No needs or concerns at this time. Maintained on 15 minute checks and observation by security camera for safety.

## 2017-01-17 NOTE — ED Provider Notes (Signed)
Cleared for d/c by Dr. Toni Amend of psychiatry   Jene Every, MD 01/17/17 1235

## 2017-01-17 NOTE — ED Notes (Signed)
Patient asleep in room. No noted distress or abnormal behavior. Will continue 15 minute checks and observation by security cameras for safety. 

## 2017-01-17 NOTE — ED Notes (Signed)
Pt dressed for discharge. Pt given bus passes and discharge paperwork. Pt discharged to lobby.

## 2017-01-17 NOTE — ED Notes (Signed)
Breakfast tray set in room. 

## 2017-01-17 NOTE — Consult Note (Signed)
[facility] [facility] Consult   Reason for Consult:  Consult for 41 year old man with a history of alcohol and drug abuse who came to the hospital after threatening to kill himself Referring Physician:  Corky Downs Patient Identification: Alex [name] MRN:  [id number] Principal Diagnosis: Substance induced mood disorder [facility]) Diagnosis:   Patient Active Problem List   Diagnosis Date Noted  . Substance induced mood disorder ([place]) [F19.94] 12/30/2016  . Moderate recurrent major depression ([place]) [F33.1] 12/30/2016  . Cocaine abuse [F14.10]   . ADHD [F90.9] 08/11/2016  . Alcohol dependence with unspecified alcohol-induced disorder ([place]) [F10.29] 06/28/2016  . Alcohol withdrawal ([place]) [F10.239] 06/28/2016  . Cocaine use disorder, moderate, dependence ([place]) [F14.20] 06/28/2016  . Unspecified Depressive disorder [F32.9] 06/28/2016  . Tobacco use disorder [F17.200] 06/28/2016    Total Time spent with patient: 1 hour  Subjective:   Alex [name] is a 41 y.o. male patient admitted with "I just been real worried.".  HPI:  Patient interviewed chart reviewed. Patient was brought into the emergency room earlier in the weekend. He had been apparently once again threatening to jump off a bridge and a Curator he knows had assisted him in coming to the hospital instead. Patient was intoxicated at the time. He admits that he continues to drink and abuse cocaine regularly. He says he worries all the time about his financial problems and his personal problems. Today however he has sobered up and says he has no thoughts at all about killing himself or wanting to die. Patient is not having any psychosis or hallucinations. He says that he has been compliant with prescription medicine outside the hospital. He is particularly fixated on his Strattera. He says that it cost him $500 which is a big reason he is so financially stressed otherwise.  Medical history: Other than his chronic substance  abuse no major other medical problems ongoing.  Social history: Lives with his mother and brother out in [place]. Rarely manages to work regularly. Little support outside his immediate family.  Substance abuse history: Long history of alcohol abuse and cocaine abuse as well as regular use or abuse of marijuana. Substance abuse is pretty much always involved with his acute presentation to the hospital. He has been referred many times to [facility] and has been only partially compliant with outpatient treatment but does not tend to maintain any outpatient compliance with substance abuse treatment for long. Delirium tremens  Past Psychiatric History: Patient has had multiple presentations to the emergency room almost identical to this one. Usually while intoxicated he will threaten to jump off of a bridge near his home. He somehow will always be intercepted by police officers before he does anything to harm himself. Patient has been prescribed medicines for depression and for ADHD and for anxiety. None of this seems to of had a major impact on changing his behavior.  Risk to Self: Suicidal Ideation: Yes-Currently Present Suicidal Intent: Yes-Currently Present Is patient at risk for suicide?: Yes Suicidal Plan?: Yes-Currently Present Specify Current Suicidal Plan: Jump off bridge Access to Means: Yes Specify Access to Suicidal Means: access to bridge What has been your use of drugs/alcohol within the last 12 months?: alcohol, cocaine, marijuana How many times?: 4 Other Self Harm Risks: active addiction Triggers for Past Attempts: Unknown Intentional Self Injurious Behavior: None Risk to Others: Homicidal Ideation: No Thoughts of Harm to Others: No Current Homicidal Intent: No Current Homicidal Plan: No Access to Homicidal Means: No Identified Victim: None identified  History of harm to others?: No Assessment of Violence: None Noted Does patient have access to weapons?: No Criminal Charges  Pending?: No Does patient have a court date: No Prior Inpatient Therapy: Prior Inpatient Therapy: Yes Prior Therapy Dates: 11/2016, 06/2016, 05/2016 & 07/2011 Prior Therapy Facilty/Provider(s): [facility], Ellin Mayhew, [facility] Reason for Treatment: Depression and substance use Prior Outpatient Therapy: Prior Outpatient Therapy: Yes Prior Therapy Dates: unsure Prior Therapy Facilty/Provider(s): [facility] Reason for Treatment: Depression and Substance Use Does patient have an ACCT team?: No Does patient have Intensive In-House Services?  : No Does patient have [facility] services? : No Does patient have P4CC services?: No  Past Medical History:  Past Medical History:  Diagnosis Date  . Alcohol abuse   . Anxiety   . Cocaine abuse 05/27/2016  . Depression     Past Surgical History:  Procedure Laterality Date  . FINGER SURGERY     Family History: History reviewed. No pertinent family history. Family Psychiatric  History: Positive for alcohol abuse Social History:  History  Alcohol Use  . 25.2 oz/week  . 53 Cans of beer per week    Comment: pt admits to drinking 6+ beers a day     History  Drug Use  . Types: Marijuana, Cocaine    Comment: cocaine today    Social History   Social History  . Marital status: Single    Spouse name: N/A  . Number of children: N/A  . Years of education: N/A   Social History Main Topics  . Smoking status: Current Every Day Smoker    Packs/day: 1.00    Types: Cigarettes  . Smokeless tobacco: Never Used  . Alcohol use 25.2 oz/week    42 Cans of beer per week     Comment: pt admits to drinking 6+ beers a day  . Drug use: Yes    Types: Marijuana, Cocaine     Comment: cocaine today  . Sexual activity: Yes    Birth control/ protection: None   Other Topics Concern  . None   Social History Narrative  . None   Additional Social History:    Allergies:   Allergies  Allergen Reactions  . Peanut Butter Flavor     Labs:   Results for orders placed or performed during the hospital encounter of 01/15/17 (from the past 48 hour(s))  Comprehensive metabolic panel     Status: Abnormal   Collection Time: 01/15/17 10:39 PM  Result Value Ref Range   Sodium 131 (L) 135 - 145 mmol/L   Potassium 3.6 3.5 - 5.1 mmol/L   Chloride 96 (L) 101 - 111 mmol/L   CO2 22 22 - 32 mmol/L   Glucose, Bld 87 65 - 99 mg/dL   BUN 10 6 - 20 mg/dL   Creatinine, Ser 0.67 0.61 - 1.24 mg/dL   Calcium 8.9 8.9 - 10.3 mg/dL   Total Protein 8.2 (H) 6.5 - 8.1 g/dL   Albumin 4.6 3.5 - 5.0 g/dL   AST 26 15 - 41 U/L   ALT 15 (L) 17 - 63 U/L   Alkaline Phosphatase 95 38 - 126 U/L   Total Bilirubin 0.8 0.3 - 1.2 mg/dL   GFR calc non Af Amer >60 >60 mL/min   GFR calc Af Amer >60 >60 mL/min    Comment: (NOTE) The eGFR has been calculated using the CKD EPI equation. This calculation has not been validated in all clinical situations. eGFR's persistently <60 mL/min signify possible Chronic Kidney  Disease.    Anion gap 13 5 - 15  Ethanol     Status: Abnormal   Collection Time: 01/15/17 10:39 PM  Result Value Ref Range   Alcohol, Ethyl (B) 212 (H) <5 mg/dL    Comment:        LOWEST DETECTABLE LIMIT FOR SERUM ALCOHOL IS 5 mg/dL FOR MEDICAL PURPOSES ONLY   Salicylate level     Status: None   Collection Time: 01/15/17 10:39 PM  Result Value Ref Range   Salicylate Lvl <7.1 2.8 - 30.0 mg/dL  Acetaminophen level     Status: Abnormal   Collection Time: 01/15/17 10:39 PM  Result Value Ref Range   Acetaminophen (Tylenol), Serum <10 (L) 10 - 30 ug/mL    Comment:        THERAPEUTIC CONCENTRATIONS VARY SIGNIFICANTLY. A RANGE OF 10-30 ug/mL MAY BE AN EFFECTIVE CONCENTRATION FOR MANY PATIENTS. HOWEVER, SOME ARE BEST TREATED AT CONCENTRATIONS OUTSIDE THIS RANGE. ACETAMINOPHEN CONCENTRATIONS >150 ug/mL AT 4 HOURS AFTER INGESTION AND >50 ug/mL AT 12 HOURS AFTER INGESTION ARE OFTEN ASSOCIATED WITH TOXIC REACTIONS.   cbc     Status: Abnormal    Collection Time: 01/15/17 10:39 PM  Result Value Ref Range   WBC 10.8 (H) 3.8 - 10.6 K/uL   RBC 5.01 4.40 - 5.90 MIL/uL   Hemoglobin 16.5 13.0 - 18.0 g/dL   HCT 47.8 40.0 - 52.0 %   MCV 95.4 80.0 - 100.0 fL   MCH 33.0 26.0 - 34.0 pg   MCHC 34.6 32.0 - 36.0 g/dL   RDW 14.4 11.5 - 14.5 %   Platelets 202 150 - 440 K/uL  Urine Drug Screen, Qualitative     Status: Abnormal   Collection Time: 01/15/17 10:39 PM  Result Value Ref Range   Tricyclic, Ur Screen NONE DETECTED NONE DETECTED   Amphetamines, Ur Screen NONE DETECTED NONE DETECTED   MDMA (Ecstasy)Ur Screen NONE DETECTED NONE DETECTED   Cocaine Metabolite,Ur [place] POSITIVE (A) NONE DETECTED   Opiate, Ur Screen NONE DETECTED NONE DETECTED   Phencyclidine (PCP) Ur S NONE DETECTED NONE DETECTED   Cannabinoid 50 Ng, Ur  POSITIVE (A) NONE DETECTED   Barbiturates, Ur Screen NONE DETECTED NONE DETECTED   Benzodiazepine, Ur Scrn NONE DETECTED NONE DETECTED   Methadone Scn, Ur NONE DETECTED NONE DETECTED    Comment: (NOTE) 062  Tricyclics, urine               Cutoff 1000 ng/mL 200  Amphetamines, urine             Cutoff 1000 ng/mL 300  MDMA (Ecstasy), urine           Cutoff 500 ng/mL 400  Cocaine Metabolite, urine       Cutoff 300 ng/mL 500  Opiate, urine                   Cutoff 300 ng/mL 600  Phencyclidine (PCP), urine      Cutoff 25 ng/mL 700  Cannabinoid, urine              Cutoff 50 ng/mL 800  Barbiturates, urine             Cutoff 200 ng/mL 900  Benzodiazepine, urine           Cutoff 200 ng/mL 1000 Methadone, urine                Cutoff 300 ng/mL 1100 1200 The urine drug screen provides only a preliminary,  unconfirmed 1300 analytical test result and should not be used for non-medical 1400 purposes. Clinical consideration and professional judgment should 1500 be applied to any positive drug screen result due to possible 1600 interfering substances. A more specific alternate chemical method 1700 must be used in order to obtain a  confirmed analytical result.  1800 Gas chromato graphy / mass spectrometry (GC/MS) is the preferred 1900 confirmatory method.     Current Facility-Administered Medications  Medication Dose Route Frequency Provider Last Rate Last Dose  . citalopram (CELEXA) tablet 40 mg  40 mg Oral Daily Carrie Mew, MD   40 mg at 01/17/17 0915  . LORazepam (ATIVAN) tablet 2 mg  2 mg Oral Q6H PRN Nena Polio, MD   2 mg at 01/16/17 1941  . nicotine (NICODERM CQ - dosed in mg/24 hours) patch 21 mg  21 mg Transdermal Daily Carrie Mew, MD   21 mg at 01/17/17 0915  . pantoprazole (PROTONIX) EC tablet 40 mg  40 mg Oral Daily Carrie Mew, MD   40 mg at 01/17/17 0915  . senna (SENOKOT) tablet 8.6 mg  1 tablet Oral Daily PRN Carrie Mew, MD      . traZODone (DESYREL) tablet 150 mg  150 mg Oral QHS Carrie Mew, MD   150 mg at 01/16/17 2147  . valproic acid (DEPAKENE) 250 MG capsule 750 mg  750 mg Oral QHS Carrie Mew, MD   750 mg at 01/16/17 2147   Current Outpatient Prescriptions  Medication Sig Dispense Refill  . citalopram (CELEXA) 20 MG tablet Take 2 tablets (40 mg total) by mouth daily. 30 tablet 1  . ketorolac (TORADOL) 10 MG tablet Take 10 mg by mouth every 6 (six) hours as needed.    . Multiple Vitamin (MULTIVITAMIN) tablet Take 1 tablet by mouth daily.    Marland Kitchen omeprazole (PRILOSEC) 20 MG capsule Take 20 mg by mouth 2 (two) times daily before a meal.    . ondansetron (ZOFRAN) 4 MG tablet Take 4 mg by mouth every 8 (eight) hours as needed for nausea or vomiting.    Marland Kitchen oxyCODONE (OXY IR/ROXICODONE) 5 MG immediate release tablet Take 5 mg by mouth every 4 (four) hours as needed for severe pain.    Marland Kitchen senna (SENOKOT) 8.6 MG TABS tablet Take 1 tablet by mouth daily as needed for mild constipation.    . traZODone (DESYREL) 150 MG tablet Take 1 tablet (150 mg total) by mouth at bedtime. 30 tablet 1  . valproic acid (DEPAKENE) 250 MG capsule Take 3 capsules (750 mg total) by mouth at  bedtime. 90 capsule 1    Musculoskeletal: Strength & Muscle Tone: within normal limits Gait & Station: normal Patient leans: N/A  Psychiatric Specialty Exam: Physical Exam  Nursing note and vitals reviewed. Constitutional: He appears well-developed and well-nourished.  HENT:  Head: Normocephalic and atraumatic.  Eyes: Conjunctivae are normal. Pupils are equal, round, and reactive to light.  Neck: Normal range of motion.  Cardiovascular: Regular rhythm and normal heart sounds.   Respiratory: Effort normal. No respiratory distress.  GI: Soft.  Musculoskeletal: Normal range of motion.  Neurological: He is alert.  Skin: Skin is warm and dry.  Psychiatric: He has a normal mood and affect. His speech is normal and behavior is normal. Thought content is not paranoid. Cognition and memory are normal. He expresses impulsivity. He expresses no homicidal and no suicidal ideation.    Review of Systems  Constitutional: Negative.   HENT: Negative.   Eyes:  Negative.   Respiratory: Negative.   Cardiovascular: Negative.   Gastrointestinal: Negative.   Musculoskeletal: Negative.   Skin: Negative.   Neurological: Negative.   Psychiatric/Behavioral: Positive for substance abuse. Negative for depression, hallucinations, memory loss and suicidal ideas. The patient is not nervous/anxious and does not have insomnia.     Blood pressure 121/87, pulse 81, temperature 97.8 F (36.6 C), temperature source Oral, resp. rate 18, height 5' 6"  (1.676 m), weight 86.2 kg (190 lb), SpO2 96 %.Body mass index is 30.67 kg/m.  General Appearance: Casual  Eye Contact:  Fair  Speech:  Clear and Coherent  Volume:  Normal  Mood:  Anxious  Affect:  Congruent  Thought Process:  Goal Directed  Orientation:  Full (Time, Place, and Person)  Thought Content:  Rumination  Suicidal Thoughts:  No  Homicidal Thoughts:  No  Memory:  Immediate;   Good Recent;   Fair Remote;   Fair  Judgement:  Fair  Insight:  Fair   Psychomotor Activity:  Normal  Concentration:  Concentration: Fair  Recall:  [employer] of Knowledge:  Fair  Language:  Fair  Akathisia:  No  Handed:  Right  AIMS (if indicated):     Assets:  Communication Skills Desire for Improvement Housing Physical Health  ADL's:  Intact  Cognition:  Impaired,  Mild  Sleep:        Treatment Plan Summary: Plan 41 year old man with mood problems and substance abuse problems. He has had plenty of time to sober up in the emergency room and is completely denying any suicidal or homicidal ideation. Experience with this patient has shown [place] that this is his baseline and there is unlikely to be any further improvement even if I did admit him to the hospital. Not likely to benefit from that. Mainly he needs to get off the alcohol and cocaine and stay off of it. This was reemphasized to the patient. He states some degree of understanding. He will be released from the emergency room and is to continue following up with [facility]. Discontinue commitment. Case reviewed with emergency room doctor in nursing.  Disposition: Patient does not meet criteria for psychiatric inpatient admission. Supportive therapy provided about ongoing stressors.  Alethia Berthold, MD 01/17/2017 12:28 PM

## 2017-01-17 NOTE — ED Notes (Signed)
Pt upset because SW will not allow him to have a cab voucher. RN explained a bus ticket would be provided.  Pt unhappy but cooperative. Will continue 15 minute checks and observation by security cameras for safety.

## 2017-01-17 NOTE — ED Notes (Signed)
Pt discharged to lobby. Pt given bus passes. All belongings returned to pt upon leaving the [facility].

## 2017-01-17 NOTE — ED Notes (Signed)
Pt compliant with medications. Pt in dayroom speaking with Engineer, materials. Pt is cooperative. Maintained on 15 minute checks and observation by security camera for safety.

## 2017-05-10 ENCOUNTER — Other Ambulatory Visit: Payer: Self-pay | Admitting: Psychiatry

## 2017-05-24 ENCOUNTER — Emergency Department: Payer: Medicaid Other

## 2017-05-24 ENCOUNTER — Emergency Department
Admission: EM | Admit: 2017-05-24 | Discharge: 2017-05-24 | Disposition: A | Discharge status: Home / Self Care | Payer: Medicaid Other | Attending: Emergency Medicine | Admitting: Emergency Medicine

## 2017-05-24 DIAGNOSIS — Y939 Activity, unspecified: Secondary | ICD-10-CM | POA: Insufficient documentation

## 2017-05-24 DIAGNOSIS — F1410 Cocaine abuse, uncomplicated: Secondary | ICD-10-CM | POA: Diagnosis not present

## 2017-05-24 DIAGNOSIS — Y929 Unspecified place or not applicable: Secondary | ICD-10-CM | POA: Insufficient documentation

## 2017-05-24 DIAGNOSIS — F17210 Nicotine dependence, cigarettes, uncomplicated: Secondary | ICD-10-CM | POA: Diagnosis not present

## 2017-05-24 DIAGNOSIS — R45851 Suicidal ideations: Secondary | ICD-10-CM | POA: Diagnosis not present

## 2017-05-24 DIAGNOSIS — F10120 Alcohol abuse with intoxication, uncomplicated: Secondary | ICD-10-CM | POA: Diagnosis present

## 2017-05-24 DIAGNOSIS — Z79899 Other long term (current) drug therapy: Secondary | ICD-10-CM | POA: Diagnosis not present

## 2017-05-24 DIAGNOSIS — F1210 Cannabis abuse, uncomplicated: Secondary | ICD-10-CM | POA: Insufficient documentation

## 2017-05-24 DIAGNOSIS — Y999 Unspecified external cause status: Secondary | ICD-10-CM | POA: Diagnosis not present

## 2017-05-24 DIAGNOSIS — F1994 Other psychoactive substance use, unspecified with psychoactive substance-induced mood disorder: Secondary | ICD-10-CM | POA: Diagnosis present

## 2017-05-24 DIAGNOSIS — S0990XA Unspecified injury of head, initial encounter: Secondary | ICD-10-CM | POA: Insufficient documentation

## 2017-05-24 DIAGNOSIS — F1010 Alcohol abuse, uncomplicated: Secondary | ICD-10-CM

## 2017-05-24 DIAGNOSIS — F1029 Alcohol dependence with unspecified alcohol-induced disorder: Secondary | ICD-10-CM | POA: Diagnosis present

## 2017-05-24 DIAGNOSIS — W19XXXA Unspecified fall, initial encounter: Secondary | ICD-10-CM | POA: Diagnosis not present

## 2017-05-24 DIAGNOSIS — F331 Major depressive disorder, recurrent, moderate: Secondary | ICD-10-CM | POA: Diagnosis present

## 2017-05-24 DIAGNOSIS — F10920 Alcohol use, unspecified with intoxication, uncomplicated: Secondary | ICD-10-CM

## 2017-05-24 LAB — URINALYSIS, COMPLETE (UACMP) WITH MICROSCOPIC
BACTERIA UA: NONE SEEN
BILIRUBIN URINE: NEGATIVE
Glucose, UA: NEGATIVE mg/dL
Hgb urine dipstick: NEGATIVE
Ketones, ur: NEGATIVE mg/dL
Leukocytes, UA: NEGATIVE
NITRITE: NEGATIVE
Protein, ur: NEGATIVE mg/dL
SPECIFIC GRAVITY, URINE: 1.006 (ref 1.005–1.030)
SQUAMOUS EPITHELIAL / LPF: NONE SEEN
pH: 5 (ref 5.0–8.0)

## 2017-05-24 LAB — URINE DRUG SCREEN, QUALITATIVE (ARMC ONLY)
AMPHETAMINES, UR SCREEN: NOT DETECTED
Barbiturates, Ur Screen: NOT DETECTED
Benzodiazepine, Ur Scrn: NOT DETECTED
Cannabinoid 50 Ng, Ur ~~LOC~~: POSITIVE — AB
Cocaine Metabolite,Ur ~~LOC~~: POSITIVE — AB
MDMA (Ecstasy)Ur Screen: NOT DETECTED
METHADONE SCREEN, URINE: NOT DETECTED
Opiate, Ur Screen: NOT DETECTED
Phencyclidine (PCP) Ur S: NOT DETECTED
TRICYCLIC, UR SCREEN: NOT DETECTED

## 2017-05-24 LAB — CBC
HCT: 47 % (ref 40.0–52.0)
HEMOGLOBIN: 16.3 g/dL (ref 13.0–18.0)
MCH: 34 pg (ref 26.0–34.0)
MCHC: 34.6 g/dL (ref 32.0–36.0)
MCV: 98.4 fL (ref 80.0–100.0)
Platelets: 169 10*3/uL (ref 150–440)
RBC: 4.78 MIL/uL (ref 4.40–5.90)
RDW: 15.2 % — ABNORMAL HIGH (ref 11.5–14.5)
WBC: 7.7 10*3/uL (ref 3.8–10.6)

## 2017-05-24 LAB — COMPREHENSIVE METABOLIC PANEL
ALBUMIN: 4.6 g/dL (ref 3.5–5.0)
ALK PHOS: 83 U/L (ref 38–126)
ALT: 18 U/L (ref 17–63)
AST: 28 U/L (ref 15–41)
Anion gap: 13 (ref 5–15)
BUN: 11 mg/dL (ref 6–20)
CALCIUM: 9 mg/dL (ref 8.9–10.3)
CO2: 23 mmol/L (ref 22–32)
CREATININE: 0.7 mg/dL (ref 0.61–1.24)
Chloride: 102 mmol/L (ref 101–111)
GFR calc Af Amer: >60 mL/min (ref >60)
GFR calc non Af Amer: >60 mL/min (ref >60)
GLUCOSE: 85 mg/dL (ref 65–99)
Potassium: 4 mmol/L (ref 3.5–5.1)
SODIUM: 138 mmol/L (ref 135–145)
Total Bilirubin: 0.6 mg/dL (ref 0.3–1.2)
Total Protein: 8.1 g/dL (ref 6.5–8.1)

## 2017-05-24 LAB — VALPROIC ACID LEVEL: VALPROIC ACID LVL: 14 ug/mL — AB (ref 50.0–100.0)

## 2017-05-24 LAB — ETHANOL: Alcohol, Ethyl (B): 229 mg/dL — ABNORMAL HIGH (ref <5)

## 2017-05-24 MED ORDER — CITALOPRAM HYDROBROMIDE 20 MG PO TABS
40.0000 mg | ORAL_TABLET | Freq: Every day | ORAL | Status: DC
  Administered 2017-05-24: 40 mg via ORAL
  Filled 2017-05-24: qty 2

## 2017-05-24 MED ORDER — VALPROIC ACID 250 MG PO CAPS
750.0000 mg | ORAL_CAPSULE | Freq: Every day | ORAL | Status: DC
  Filled 2017-05-24: qty 3

## 2017-05-24 MED ORDER — SODIUM CHLORIDE 0.9 % IV BOLUS (SEPSIS): 1000.0000 mL | Freq: Once | INTRAVENOUS | Status: DC

## 2017-05-24 MED ORDER — VITAMIN B-1 100 MG PO TABS
100.0000 mg | ORAL_TABLET | Freq: Every day | ORAL | Status: DC
  Administered 2017-05-24: 100 mg via ORAL
  Filled 2017-05-24: qty 1

## 2017-05-24 MED ORDER — THIAMINE HCL 100 MG/ML IJ SOLN: Freq: Once | INTRAVENOUS | Status: DC

## 2017-05-24 MED ORDER — LORAZEPAM 2 MG PO TABS
0.0000 mg | ORAL_TABLET | Freq: Four times a day (QID) | ORAL | Status: DC
  Administered 2017-05-24: 1 mg via ORAL
  Filled 2017-05-24: qty 1

## 2017-05-24 MED ORDER — PANTOPRAZOLE SODIUM 40 MG PO TBEC
40.0000 mg | DELAYED_RELEASE_TABLET | Freq: Every day | ORAL | Status: DC
Start: 2017-05-24 — End: 2017-05-24
  Administered 2017-05-24: 40 mg via ORAL
  Filled 2017-05-24: qty 1

## 2017-05-24 NOTE — ED Notes (Addendum)
Patient AAOx3.  States "I'm ready to see the doctor and get the hell out of here".  Discussed with patient that he is waiting to see psychiatry and patient states he knows that he was placed on IVC hold last night.  Patient states he does not want to see psychiatry.  Denies SI/ HI

## 2017-05-24 NOTE — ED Notes (Signed)
Pt went to Ct accompanied by Roberts Gaudy and officer.

## 2017-05-24 NOTE — ED Triage Notes (Signed)
Pt

## 2017-05-24 NOTE — Consult Note (Signed)
[place] [facility] Consult   Reason for Consult:  Consult for 41 year old man with a history of substance abuse and mood symptoms brought in under IVC because of suicidal threats Referring Physician:  Jimmye Norman Patient Identification: Alex [name] MRN:  [id number] Principal Diagnosis: Substance induced mood disorder [facility]) Diagnosis:   Patient Active Problem List   Diagnosis Date Noted  . Substance induced mood disorder ([place]) [F19.94] 12/30/2016  . Moderate recurrent major depression ([place]) [F33.1] 12/30/2016  . Cocaine abuse [F14.10]   . ADHD [F90.9] 08/11/2016  . Alcohol dependence with unspecified alcohol-induced disorder ([place]) [F10.29] 06/28/2016  . Alcohol withdrawal ([place]) [F10.239] 06/28/2016  . Cocaine use disorder, moderate, dependence ([place]) [F14.20] 06/28/2016  . Unspecified Depressive disorder [F32.9] 06/28/2016  . Tobacco use disorder [F17.200] 06/28/2016    Total Time spent with patient: 1 hour  Subjective:   Alex [name] is a 41 y.o. male patient admitted with "I got drunk yesterday".  HPI:  Patient interviewed chart reviewed. Patient known to me from many previous encounters. This is a 41 year old man with a long history of substance abuse and mood disorder. He was brought in under IVC by law enforcement who report that he was on a bridge in his local community threatening to jump off. This is his typical presentation to the hospital. Patient says that he and his brother were arguing yesterday because his brother was angry at him for getting drunk. Patient went out to the bridge and admits that he was making threats to jump off but says he actually had no intention of doing it. He didn't actually make any attempt to kill himself and has no wish to die. He says prior to yesterday he thinks he's been doing pretty good. He has cut down dramatically on the amount and frequency of his drinking and cut down on his cocaine use. He says yesterday though he was hanging out  with some people he doesn't normally hang around with any ended up drinking a lot and using cocaine. Patient states that he is following up with [facility]. He is mostly staying on his medicine although he cannot afford the prescribed Strattera. Doesn't report any specific new stresses. In fact he's been getting a little bit more work and feeling better about himself overall.  Social history: [place]. He does not get disability and has minimal income and no insurance. He and his brother have a pretty good relationship although they fuss a lot when the patient's behavior gets out of control.  Medical history: When he is not actively drinking heavily he is in reasonably good health no other active major ongoing medical problems right now.  Substance abuse history: Well-established long history of abuse of alcohol and cocaine. Multiple presentations to the hospital intoxicated. No history of full DTs. He used to be very avoidant about engaging in treatment but it looks like [facility] may have actually gotten through to him recently.  Past Psychiatric History: Patient has a history of multiple presentations to the emergency room almost always in identically the same condition. One difference is that this time he is feeling better already this morning and is denying any current suicidal thought. He has a history of making suicidal threats but has never come seriously close to killing himself in the past. He has responded reasonably well to antidepressants when he is not drinking and says that he thought the Strattera was helpful in that it is  unfortunate that he absolutely cannot afford it. No other history of psychosis.  Risk to Self: Suicidal Ideation: Yes-Currently Present Suicidal Intent: Yes-Currently Present Is patient at risk for suicide?: Yes Suicidal Plan?: Yes-Currently Present Specify Current Suicidal Plan: jump off of  bridge Access to Means: Yes Specify Access to Suicidal Means: can get to bridge to jump What has been your use of drugs/alcohol within the last 12 months?: alcohol and cocaine use How many times?: 5 Other Self Harm Risks: none Triggers for Past Attempts: Family contact Intentional Self Injurious Behavior: None Risk to Others: Homicidal Ideation: No Thoughts of Harm to Others: No Current Homicidal Intent: No Current Homicidal Plan: No Access to Homicidal Means: No Identified Victim: none History of harm to others?: No Assessment of Violence: None Noted Violent Behavior Description: none Does patient have access to weapons?: Yes (Comment) Criminal Charges Pending?: No Does patient have a court date: No Prior Inpatient Therapy: Prior Inpatient Therapy: Yes Prior Therapy Dates: 11/2016, 06/2016, 05/2016 & 07/2011 Prior Therapy Facilty/Provider(s): [facility], [facility] Reason for Treatment: SI and SA Prior Outpatient Therapy: Prior Outpatient Therapy: No Prior Therapy Dates: NA Prior Therapy Facilty/Provider(s): NA Reason for Treatment: NA Does patient have an ACCT team?: No Does patient have Intensive In-House Services?  : No Does patient have [facility] services? : No Does patient have P4CC services?: No  Past Medical History:  Past Medical History:  Diagnosis Date  . Alcohol abuse   . Anxiety   . Cocaine abuse 05/27/2016  . Depression     Past Surgical History:  Procedure Laterality Date  . FINGER SURGERY     Family History: No family history on file. Family Psychiatric  History: Positive for substance abuse Social History:  History  Alcohol Use  . 25.2 oz/week  . 19 Cans of beer per week    Comment: pt admits to drinking 6+ beers a day     History  Drug Use  . Types: Marijuana, Cocaine    Comment: cocaine today    Social History   Social History  . Marital status: Single    Spouse name: N/A  . Number of children: N/A  . Years of education: N/A   Social  History Main Topics  . Smoking status: Current Every Day Smoker    Packs/day: 1.00    Types: Cigarettes  . Smokeless tobacco: Never Used  . Alcohol use 25.2 oz/week    42 Cans of beer per week     Comment: pt admits to drinking 6+ beers a day  . Drug use: Yes    Types: Marijuana, Cocaine     Comment: cocaine today  . Sexual activity: Yes    Birth control/ protection: None   Other Topics Concern  . Not on file   Social History Narrative  . No narrative on file   Additional Social History:    Allergies:   Allergies  Allergen Reactions  . Peanut Butter Flavor     Labs:  Results for orders placed or performed during the hospital encounter of 05/24/17 (from the past 48 hour(s))  CBC     Status: Abnormal   Collection Time: 05/24/17  1:21 AM  Result Value Ref Range   WBC 7.7 3.8 - 10.6 K/uL   RBC 4.78 4.40 - 5.90 MIL/uL   Hemoglobin 16.3 13.0 - 18.0 g/dL   HCT 47.0 40.0 - 52.0 %   MCV 98.4 80.0 - 100.0 fL   MCH 34.0 26.0 - 34.0 pg  MCHC 34.6 32.0 - 36.0 g/dL   RDW 15.2 (H) 11.5 - 14.5 %   Platelets 169 150 - 440 K/uL  Comprehensive metabolic panel     Status: None   Collection Time: 05/24/17  1:21 AM  Result Value Ref Range   Sodium 138 135 - 145 mmol/L   Potassium 4.0 3.5 - 5.1 mmol/L   Chloride 102 101 - 111 mmol/L   CO2 23 22 - 32 mmol/L   Glucose, Bld 85 65 - 99 mg/dL   BUN 11 6 - 20 mg/dL   Creatinine, Ser 0.70 0.61 - 1.24 mg/dL   Calcium 9.0 8.9 - 10.3 mg/dL   Total Protein 8.1 6.5 - 8.1 g/dL   Albumin 4.6 3.5 - 5.0 g/dL   AST 28 15 - 41 U/L   ALT 18 17 - 63 U/L   Alkaline Phosphatase 83 38 - 126 U/L   Total Bilirubin 0.6 0.3 - 1.2 mg/dL   GFR calc non Af Amer >60 >60 mL/min   GFR calc Af Amer >60 >60 mL/min    Comment: (NOTE) The eGFR has been calculated using the CKD EPI equation. This calculation has not been validated in all clinical situations. eGFR's persistently <60 mL/min signify possible Chronic Kidney Disease.    Anion gap 13 5 - 15   Ethanol     Status: Abnormal   Collection Time: 05/24/17  1:21 AM  Result Value Ref Range   Alcohol, Ethyl (B) 229 (H) <5 mg/dL    Comment:        LOWEST DETECTABLE LIMIT FOR SERUM ALCOHOL IS 5 mg/dL FOR MEDICAL PURPOSES ONLY   Urinalysis, Complete w Microscopic     Status: Abnormal   Collection Time: 05/24/17  1:21 AM  Result Value Ref Range   Color, Urine YELLOW (A) YELLOW   APPearance CLEAR (A) CLEAR   Specific Gravity, Urine 1.006 1.005 - 1.030   pH 5.0 5.0 - 8.0   Glucose, UA NEGATIVE NEGATIVE mg/dL   Hgb urine dipstick NEGATIVE NEGATIVE   Bilirubin Urine NEGATIVE NEGATIVE   Ketones, ur NEGATIVE NEGATIVE mg/dL   Protein, ur NEGATIVE NEGATIVE mg/dL   Nitrite NEGATIVE NEGATIVE   Leukocytes, UA NEGATIVE NEGATIVE   RBC / HPF 0-5 0 - 5 RBC/hpf   WBC, UA 0-5 0 - 5 WBC/hpf   Bacteria, UA NONE SEEN NONE SEEN   Squamous Epithelial / LPF NONE SEEN NONE SEEN  Valproic acid level     Status: Abnormal   Collection Time: 05/24/17  1:21 AM  Result Value Ref Range   Valproic Acid Lvl 14 (L) 50.0 - 100.0 ug/mL  Urine Drug Screen, Qualitative ([facility] only)     Status: Abnormal   Collection Time: 05/24/17  1:45 AM  Result Value Ref Range   Tricyclic, Ur Screen NONE DETECTED NONE DETECTED   Amphetamines, Ur Screen NONE DETECTED NONE DETECTED   MDMA (Ecstasy)Ur Screen NONE DETECTED NONE DETECTED   Cocaine Metabolite,Ur [place] POSITIVE (A) NONE DETECTED   Opiate, Ur Screen NONE DETECTED NONE DETECTED   Phencyclidine (PCP) Ur S NONE DETECTED NONE DETECTED   Cannabinoid 50 Ng, Ur [place] POSITIVE (A) NONE DETECTED   Barbiturates, Ur Screen NONE DETECTED NONE DETECTED   Benzodiazepine, Ur Scrn NONE DETECTED NONE DETECTED   Methadone Scn, Ur NONE DETECTED NONE DETECTED    Comment: (NOTE) 287  Tricyclics, urine               Cutoff 1000 ng/mL 200  Amphetamines, urine  Cutoff 1000 ng/mL 300  MDMA (Ecstasy), urine           Cutoff 500 ng/mL 400  Cocaine Metabolite, urine       Cutoff 300  ng/mL 500  Opiate, urine                   Cutoff 300 ng/mL 600  Phencyclidine (PCP), urine      Cutoff 25 ng/mL 700  Cannabinoid, urine              Cutoff 50 ng/mL 800  Barbiturates, urine             Cutoff 200 ng/mL 900  Benzodiazepine, urine           Cutoff 200 ng/mL 1000 Methadone, urine                Cutoff 300 ng/mL 1100 1200 The urine drug screen provides only a preliminary, unconfirmed 1300 analytical test result and should not be used for non-medical 1400 purposes. Clinical consideration and professional judgment should 1500 be applied to any positive drug screen result due to possible 1600 interfering substances. A more specific alternate chemical method 1700 must be used in order to obtain a confirmed analytical result.  1800 Gas chromato graphy / mass spectrometry (GC/MS) is the preferred 1900 confirmatory method.     Current Facility-Administered Medications  Medication Dose Route Frequency Provider Last Rate Last Dose  . citalopram (CELEXA) tablet 40 mg  40 mg Oral Daily Paulette Blanch, MD   40 mg at 05/24/17 1015  . LORazepam (ATIVAN) tablet 0-4 mg  0-4 mg Oral Q6H Paulette Blanch, MD   1 mg at 05/24/17 0321  . pantoprazole (PROTONIX) EC tablet 40 mg  40 mg Oral Daily Paulette Blanch, MD   40 mg at 05/24/17 1015  . sodium chloride 0.9 % 1,000 mL with thiamine 562 mg, folic acid 1 mg, multivitamins adult 10 mL infusion   Intravenous Once Sung, Jade J, MD      . sodium chloride 0.9 % bolus 1,000 mL  1,000 mL Intravenous Once Paulette Blanch, MD      . thiamine (VITAMIN B-1) tablet 100 mg  100 mg Oral Daily Paulette Blanch, MD   100 mg at 05/24/17 1015  . valproic acid (DEPAKENE) 250 MG capsule 750 mg  750 mg Oral QHS Paulette Blanch, MD       Current Outpatient Prescriptions  Medication Sig Dispense Refill  . citalopram (CELEXA) 20 MG tablet Take 2 tablets (40 mg total) by mouth daily. 30 tablet 1  . Multiple Vitamin (MULTIVITAMIN) tablet Take 1 tablet by mouth daily.    Marland Kitchen  omeprazole (PRILOSEC) 20 MG capsule Take 20 mg by mouth 2 (two) times daily before a meal.    . traZODone (DESYREL) 150 MG tablet Take 1 tablet (150 mg total) by mouth at bedtime. 30 tablet 1  . valproic acid (DEPAKENE) 250 MG capsule Take 3 capsules (750 mg total) by mouth at bedtime. 90 capsule 1  . ondansetron (ZOFRAN) 4 MG tablet Take 4 mg by mouth every 8 (eight) hours as needed for nausea or vomiting.      Musculoskeletal: Strength & Muscle Tone: within normal limits Gait & Station: normal Patient leans: N/A  Psychiatric Specialty Exam: Physical Exam  Nursing note and vitals reviewed. Constitutional: He appears well-developed and well-nourished.  HENT:  Head: Normocephalic and atraumatic.  Eyes: Pupils are equal, round, and reactive to  light. Conjunctivae are normal.  Neck: Normal range of motion.  Cardiovascular: Regular rhythm and normal heart sounds.   Respiratory: Effort normal. No respiratory distress.  GI: Soft.  Musculoskeletal: Normal range of motion.  Neurological: He is alert.  Skin: Skin is warm and dry.  Psychiatric: He has a normal mood and affect. His speech is normal and behavior is normal. Thought content normal. Thought content is not paranoid. He expresses impulsivity. He expresses no homicidal and no suicidal ideation. He exhibits abnormal recent memory.    Review of Systems  Constitutional: Negative.   HENT: Negative.   Eyes: Negative.   Respiratory: Negative.   Cardiovascular: Negative.   Gastrointestinal: Negative.   Musculoskeletal: Negative.   Skin: Negative.   Neurological: Negative.   Psychiatric/Behavioral: Positive for memory loss and substance abuse. Negative for depression, hallucinations and suicidal ideas. The patient is nervous/anxious. The patient does not have insomnia.     Blood pressure 110/63, pulse 90, temperature 98.5 F (36.9 C), temperature source Oral, resp. rate 19, height 5' 6"  (1.676 m), weight 86.2 kg (190 lb), SpO2 95  %.Body mass index is 30.67 kg/m.  General Appearance: Disheveled  Eye Contact:  Fair  Speech:  Slow  Volume:  Decreased  Mood:  Euthymic  Affect:  Congruent  Thought Process:  Goal Directed  Orientation:  Full (Time, Place, and Person)  Thought Content:  Logical  Suicidal Thoughts:  No  Homicidal Thoughts:  No  Memory:  Immediate;   Good Recent;   Poor Remote;   Fair  Judgement:  Fair  Insight:  Fair  Psychomotor Activity:  Decreased  Concentration:  Concentration: Fair  Recall:  Poor  Fund of Knowledge:  Fair  Language:  Fair  Akathisia:  No  Handed:  Right  AIMS (if indicated):     Assets:  Desire for Improvement Housing Physical Health Social Support  ADL's:  Intact  Cognition:  Impaired,  Mild  Sleep:        Treatment Plan Summary: Plan 41 year old man with a history of substance abuse and mood disorder who came in after making suicidal threats to police. He is no longer intoxicated now and is sheepish and a little embarrassed about what happened. He completely denies any wish to die. He is able to articulate positive things in his life and plans for the future. He has some pride about how he had been doing pretty well recently and staying off of substances. Patient given a lot of praise and supportive counseling around his efforts to stay sober. Encouraged to continue his medicine and his mental health treatment. Patient does not meet commitment criteria and can be released from the emergency room. Case reviewed with TTS and emergency room physician. IVC discontinued.  Disposition: No evidence of imminent risk to self or others at present.   Patient does not meet criteria for psychiatric inpatient admission. Supportive therapy provided about ongoing stressors.  Alethia Berthold, MD 05/24/2017 10:43 AM

## 2017-05-24 NOTE — ED Notes (Addendum)
Phone call placed to patient's legal guardian which is his mother, Edmon Crape. Ms. Kerney Elbe states she doesn't have a ride to come and get the patient, but would like for him to be placed on ATomoxetine 40mg  daily. Ms. Kerney Elbe also stated that the patient could get a ride home on the $1 bus and walk home from the bus stop. Dr. Mayford Knife informed.

## 2017-05-24 NOTE — ED Notes (Signed)
Pt returned from CT °

## 2017-05-24 NOTE — ED Notes (Signed)
Patient assigned to appropriate care area. Patient oriented to unit/care area: Informed that, for their safety, care areas are designed for safety and monitored by security cameras at all times; and visiting hours explained to patient. Patient verbalizes understanding, and verbal contract for safety obtained. 

## 2017-05-24 NOTE — ED Notes (Signed)
Sherilyn Cooter RN called the lab to confirm that they could add on the requested lab work to the sample taken earlier.

## 2017-05-24 NOTE — ED Provider Notes (Signed)
[facility] Emergency Department Provider Note   ____________________________________________   First MD Initiated Contact with Patient 05/24/17 0143     (approximate)  I have reviewed the triage vital signs and the nursing notes.   HISTORY  Chief Complaint Suicidal    HPI Alex [name] is a 41 y.o. male brought to the ED by police under IVC for suicidal ideation. Patient has a history of substance of do some mood disorder, depression, alcohol dependency, cocaine abuse who threatened to jump off the bridge.similar symptoms previously. Denies active HI/AH/VH. States he fell yesterday, striking his head. Denies LOC.Denies recent fever, chills, chest pain, shortness of breath, abdominal pain, nausea, vomiting, dizziness.   Past Medical History:  Diagnosis Date  . Alcohol abuse   . Anxiety   . Cocaine abuse 05/27/2016  . Depression     Patient Active Problem List   Diagnosis Date Noted  . Substance induced mood disorder (HCC) 12/30/2016  . Moderate recurrent major depression (HCC) 12/30/2016  . Cocaine abuse   . ADHD 08/11/2016  . Alcohol dependence with unspecified alcohol-induced disorder (HCC) 06/28/2016  . Alcohol withdrawal (HCC) 06/28/2016  . Cocaine use disorder, moderate, dependence (HCC) 06/28/2016  . Unspecified Depressive disorder 06/28/2016  . Tobacco use disorder 06/28/2016    Past Surgical History:  Procedure Laterality Date  . FINGER SURGERY      Prior to Admission medications   Medication Sig Start Date End Date Taking? Authorizing Provider  citalopram (CELEXA) 20 MG tablet Take 2 tablets (40 mg total) by mouth daily. 12/30/16   Clapacs, Jackquline Denmark, MD  ketorolac (TORADOL) 10 MG tablet Take 10 mg by mouth every 6 (six) hours as needed.    [provider]  Multiple Vitamin (MULTIVITAMIN) tablet Take 1 tablet by mouth daily.    [provider]  omeprazole (PRILOSEC) 20 MG capsule Take 20 mg by mouth 2 (two) times  daily before a meal.    [provider]  ondansetron (ZOFRAN) 4 MG tablet Take 4 mg by mouth every 8 (eight) hours as needed for nausea or vomiting.    [provider]  oxyCODONE (OXY IR/ROXICODONE) 5 MG immediate release tablet Take 5 mg by mouth every 4 (four) hours as needed for severe pain.    [provider]  senna (SENOKOT) 8.6 MG TABS tablet Take 1 tablet by mouth daily as needed for mild constipation.    [provider]  traZODone (DESYREL) 150 MG tablet Take 1 tablet (150 mg total) by mouth at bedtime. 12/30/16   Clapacs, Jackquline Denmark, MD  valproic acid (DEPAKENE) 250 MG capsule Take 3 capsules (750 mg total) by mouth at bedtime. 12/30/16   Clapacs, Jackquline Denmark, MD    Allergies Peanut butter flavor  No family history on file.  Social History Social History  Substance Use Topics  . Smoking status: Current Every Day Smoker    Packs/day: 1.00    Types: Cigarettes  . Smokeless tobacco: Never Used  . Alcohol use 25.2 oz/week    42 Cans of beer per week     Comment: pt admits to drinking 6+ beers a day    Review of Systems  Constitutional: No fever/chills. Eyes: No visual changes. ENT: No sore throat. Cardiovascular: Denies chest pain. Respiratory: Denies shortness of breath. Gastrointestinal: No abdominal pain.  No nausea, no vomiting.  No diarrhea.  No constipation. Genitourinary: Negative for dysuria. Musculoskeletal: Negative for back pain. Skin: Negative for rash. Neurological: positive for minor  head injury yesterday. Negative for headaches, focal weakness or numbness. Psychiatric:positive for depression with SI.  ____________________________________________   PHYSICAL EXAM:  VITAL SIGNS: ED Triage Vitals [05/24/17 0119]  Enc Vitals Group     BP 122/74     Pulse Rate 84     Resp 20     Temp 98.6 F (37 C)     Temp Source Oral     SpO2 95 %     Weight 190 lb (86.2 kg)     Height 5\' 6"  (1.676 m)     Head Circumference      Peak  Flow      Pain Score      Pain Loc      Pain Edu?      Excl. in GC?     Constitutional: Alert and oriented. Disheveledappearing and in no acute distress. Intoxicated. Eyes: Conjunctivae are normal. PERRL. EOMI. Head: Atraumatic. Nose: No congestion/rhinnorhea. Mouth/Throat: Mucous membranes are moist.  Oropharynx non-erythematous. Neck: No stridor.  No cervical spine tenderness to palpation. Cardiovascular: Normal rate, regular rhythm. Grossly normal heart sounds.  Good peripheral circulation. Respiratory: Normal respiratory effort.  No retractions. Lungs CTAB. Gastrointestinal: Soft and nontender. No distention. No abdominal bruits. No CVA tenderness. Musculoskeletal: No lower extremity tenderness nor edema.  No joint effusions. Neurologic:  Intoxicated. Normal speech and language. No gross focal neurologic deficits are appreciated. No gait instability. Skin:  Skin is warm, dry and intact. No rash noted. Psychiatric: Mood and affect are normal. Speech and behavior are normal.  ____________________________________________   LABS (all labs ordered are listed, but only abnormal results are displayed)  Labs Reviewed  CBC - Abnormal; Notable for the following:       Result Value   RDW 15.2 (*)    All other components within normal limits  ETHANOL - Abnormal; Notable for the following:    Alcohol, Ethyl (B) 229 (*)    All other components within normal limits  URINALYSIS, COMPLETE (UACMP) WITH MICROSCOPIC - Abnormal; Notable for the following:    Color, Urine YELLOW (*)    APPearance CLEAR (*)    All other components within normal limits  URINE DRUG SCREEN, QUALITATIVE ([facility] ONLY) - Abnormal; Notable for the following:    Cocaine Metabolite,Ur [place] POSITIVE (*)    Cannabinoid 50 Ng, Ur [place] POSITIVE (*)    All other components within normal limits  COMPREHENSIVE METABOLIC PANEL    ____________________________________________  EKG  none ____________________________________________  RADIOLOGY  No results found.  ____________________________________________   PROCEDURES  Procedure(s) performed: None  Procedures  Critical Care performed: No  ____________________________________________   INITIAL IMPRESSION / ASSESSMENT AND PLAN / ED COURSE  Pertinent labs & imaging results that were available during my care of the patient were reviewed by me and considered in my medical decision making (see chart for details).  41 year old male well-known to the ED for alcohol intoxication, polysubstance abuse brought in under IVC for suicidality. Will initiate IV fluids, banana bag, placed on CIWA protocol. Will obtain CT head given patient's recent fall with minor head injury. Maintain IVC pending psychiatry consult in the morning.  Clinical Course as of May 24 724  Tue May 24, 2017  5188 Valproic acid level subtherapeutic. Patient's home meds ordered.  [JS]    Clinical Course User Index [JS] Irean Hong, MD     ____________________________________________   FINAL CLINICAL IMPRESSION(S) / ED DIAGNOSES  Final diagnoses:  Alcohol abuse  Moderate episode of recurrent major  depressive disorder (HCC)  Alcoholic intoxication without complication (HCC)  Cocaine abuse  Marijuana abuse      NEW MEDICATIONS STARTED DURING THIS VISIT:  New Prescriptions   No medications on file     Note:  This document was prepared using Dragon voice recognition software and may include unintentional dictation errors.    Irean Hong, MD 05/24/17 [id number]

## 2017-05-24 NOTE — ED Notes (Signed)
Taxi called for ride. Dispatch said it would be one hour.

## 2017-05-24 NOTE — ED Notes (Signed)
Pt dressed in behavioral scrubs by this RN. Pt's belongings include: 1 red t-shirt, 1 pair of shorts, 1 pair of boxer briefs, 1 pair of socks, 1 pair of sandals, and 1 cell phone. Belongings bagged and labeled per ED protocol.

## 2017-05-24 NOTE — ED Notes (Signed)
Cab has arrived.

## 2017-05-24 NOTE — BH Assessment (Signed)
Assessment Note  Alex [name] is an 41 y.o. male. The patient came in after threatening to jump off of a bridge.  The patient has presented at least 3 times in the past year of threatening to jump off of a bridge.  The patient also came in intoxicated.  He could not give an amount that he has been drinking other than that it has been "all day".  His BAC level was 229 at admission..  The patient denies drinking on a daily basis.  However, the patient has had his BAC level checked at least 5 times in the past year,  in the ED with ranges of 138-292.  The patient also has a history of abusing cocaine.  His UDS is positive for Cocaine and Cannabis.  When assessed the patient still expressed wanting to jump off a bridge.  He describes his primary stressor as his mother, who he lives with.  The patient has been referred to counselors and psychiatrist in the past.  He reported he is not going to see a psychiatrist and he has not followed up with counselors.  The patient denies HI and psychosis.  Diagnosis: Major Depressive Disorder  Past Medical History:  Past Medical History:  Diagnosis Date  . Alcohol abuse   . Anxiety   . Cocaine abuse 05/27/2016  . Depression     Past Surgical History:  Procedure Laterality Date  . FINGER SURGERY      Family History: No family history on file.  Social History:  reports that he has been smoking Cigarettes.  He has been smoking about 1.00 pack per day. He has never used smokeless tobacco. He reports that he drinks about 25.2 oz of alcohol per week . He reports that he uses drugs, including Marijuana and Cocaine.  Additional Social History:  Alcohol / Drug Use Pain Medications: See PTA Prescriptions: See PTA Over the Counter: See PTA History of alcohol / drug use?: Yes Longest period of sobriety (when/how long): unknown Substance #1 Name of Substance 1: Alcohol Substance #2 Name of Substance 2: Cocaine  CIWA: CIWA-Ar BP: 122/74 Pulse Rate:  84 Nausea and Vomiting: no nausea and no vomiting Tactile Disturbances: very mild itching, pins and needles, burning or numbness Tremor: no tremor Auditory Disturbances: not present Paroxysmal Sweats: barely perceptible sweating, palms moist Visual Disturbances: not present Anxiety: no anxiety, at ease Headache, Fullness in Head: moderate Agitation: normal activity Orientation and Clouding of Sensorium: oriented and can do serial additions CIWA-Ar Total: 5 COWS:    Allergies:  Allergies  Allergen Reactions  . Peanut Butter Flavor     Home Medications:  (Not in a hospital admission)  OB/GYN Status:  No LMP for male patient.  General Assessment Data Location of Assessment: [facility] ED TTS Assessment: In system Is this a Tele or Face-to-Face Assessment?: Face-to-Face Is this an Initial Assessment or a Re-assessment for this encounter?: Initial Assessment Marital status: Single Living Arrangements: Parent Can pt return to current living arrangement?: Yes Admission Status: Involuntary Is patient capable of signing voluntary admission?: No Referral Source: Self/Family/Friend Insurance type: None     Crisis Care Plan Living Arrangements: Parent Legal Guardian: Other: (self) Name of Psychiatrist: none Name of Therapist: none  Education Status Is patient currently in school?: No Current Grade: NA Highest grade of school patient has completed: 11th Name of school: NA Contact person: NA  Risk to self with the past 6 months Suicidal Ideation: Yes-Currently Present Has patient been a risk to self  within the past 6 months prior to admission? : Yes Suicidal Intent: Yes-Currently Present Has patient had any suicidal intent within the past 6 months prior to admission? : Yes Is patient at risk for suicide?: Yes Suicidal Plan?: Yes-Currently Present Has patient had any suicidal plan within the past 6 months prior to admission? : Yes Specify Current Suicidal Plan: jump off of  bridge Access to Means: Yes Specify Access to Suicidal Means: can get to bridge to jump What has been your use of drugs/alcohol within the last 12 months?: alcohol and cocaine use Previous Attempts/Gestures: Yes How many times?: 5 Other Self Harm Risks: none Triggers for Past Attempts: Family contact Intentional Self Injurious Behavior: None Family Suicide History: Unknown Recent stressful life event(s): Conflict (Comment) (conflict with mother) Persecutory voices/beliefs?: No Depression: Yes Depression Symptoms: Insomnia, Feeling worthless/self pity Substance abuse history and/or treatment for substance abuse?: Yes Suicide prevention information given to non-admitted patients: Not applicable  Risk to Others within the past 6 months Homicidal Ideation: No Does patient have any lifetime risk of violence toward others beyond the six months prior to admission? : No Thoughts of Harm to Others: No Current Homicidal Intent: No Current Homicidal Plan: No Access to Homicidal Means: No Identified Victim: none History of harm to others?: No Assessment of Violence: None Noted Violent Behavior Description: none Does patient have access to weapons?: Yes (Comment) Criminal Charges Pending?: No Does patient have a court date: No Is patient on probation?: Yes  Psychosis Hallucinations: None noted Delusions: None noted  Mental Status Report Appearance/Hygiene: Unremarkable, In scrubs Eye Contact: Fair Motor Activity: Unremarkable, Freedom of movement Speech: Logical/coherent Level of Consciousness: Alert Mood: Pleasant Affect: Sad Anxiety Level: Minimal Thought Processes: Coherent, Relevant Judgement: Impaired Orientation: Person, Place, Time, Situation, Appropriate for developmental age Obsessive Compulsive Thoughts/Behaviors: None  Cognitive Functioning Concentration: Normal Memory: Recent Intact, Remote Intact IQ: Average Insight: Poor Impulse Control: Poor Appetite:  Good Weight Loss: 0 Weight Gain: 0 Sleep: Decreased Total Hours of Sleep: 5 Vegetative Symptoms: None  ADLScreening [facility] Assessment Services) Patient's cognitive ability adequate to safely complete daily activities?: Yes Patient able to express need for assistance with ADLs?: Yes Independently performs ADLs?: Yes (appropriate for developmental age)  Prior Inpatient Therapy Prior Inpatient Therapy: Yes Prior Therapy Dates: 11/2016, 06/2016, 05/2016 & 07/2011 Prior Therapy Facilty/Provider(s): [facility], [facility] Reason for Treatment: SI and SA  Prior Outpatient Therapy Prior Outpatient Therapy: No Prior Therapy Dates: NA Prior Therapy Facilty/Provider(s): NA Reason for Treatment: NA Does patient have an ACCT team?: No Does patient have Intensive In-House Services?  : No Does patient have [facility] services? : No Does patient have P4CC services?: No  ADL Screening (condition at time of admission) Patient's cognitive ability adequate to safely complete daily activities?: Yes Is the patient deaf or have difficulty hearing?: No Does the patient have difficulty seeing, even when wearing glasses/contacts?: No Does the patient have difficulty concentrating, remembering, or making decisions?: No Patient able to express need for assistance with ADLs?: Yes Does the patient have difficulty dressing or bathing?: No Independently performs ADLs?: Yes (appropriate for developmental age) Does the patient have difficulty walking or climbing stairs?: No Weakness of Legs: None Weakness of Arms/Hands: None  Home Assistive Devices/Equipment Home Assistive Devices/Equipment: None  Therapy Consults (therapy consults require a physician order) PT Evaluation Needed: No OT Evalulation Needed: No SLP Evaluation Needed: No Abuse/Neglect Assessment (Assessment to be complete while patient is alone) Physical Abuse: Denies Verbal Abuse: Denies Sexual Abuse: Denies Exploitation  of patient/patient's  resources: Denies Values / Beliefs Cultural Requests During Hospitalization: None Spiritual Requests During Hospitalization: None Consults Spiritual Care Consult Needed: No Social Work Consult Needed: No      Additional Information 1:1 In Past 12 Months?: No CIRT Risk: No Elopement Risk: No Does patient have medical clearance?: Yes     Disposition:  Disposition Initial Assessment Completed for this Encounter: Yes Disposition of Patient: Referred to (psychiatrist)  On Site Evaluation by:   Reviewed with Physician:    Ottis Stain 05/24/2017 4:23 AM

## 2017-05-25 ENCOUNTER — Other Ambulatory Visit: Payer: Self-pay | Admitting: Psychiatry

## 2017-05-29 ENCOUNTER — Emergency Department: Payer: Medicaid Other

## 2017-05-29 ENCOUNTER — Emergency Department (EMERGENCY_DEPARTMENT_HOSPITAL)
Admission: EM | Admit: 2017-05-29 | Discharge: 2017-05-30 | Disposition: A | Discharge status: Home / Self Care | Payer: Medicaid Other | Source: Home / Self Care | Attending: Emergency Medicine | Admitting: Emergency Medicine

## 2017-05-29 ENCOUNTER — Emergency Department
Admission: EM | Admit: 2017-05-29 | Discharge: 2017-05-29 | Disposition: A | Discharge status: Home / Self Care | Payer: Medicaid Other | Attending: Emergency Medicine | Admitting: Emergency Medicine

## 2017-05-29 ENCOUNTER — Encounter: Payer: Self-pay | Admitting: Emergency Medicine

## 2017-05-29 DIAGNOSIS — Y999 Unspecified external cause status: Secondary | ICD-10-CM | POA: Diagnosis not present

## 2017-05-29 DIAGNOSIS — F1994 Other psychoactive substance use, unspecified with psychoactive substance-induced mood disorder: Secondary | ICD-10-CM | POA: Diagnosis not present

## 2017-05-29 DIAGNOSIS — Y92009 Unspecified place in unspecified non-institutional (private) residence as the place of occurrence of the external cause: Secondary | ICD-10-CM | POA: Diagnosis not present

## 2017-05-29 DIAGNOSIS — F10929 Alcohol use, unspecified with intoxication, unspecified: Secondary | ICD-10-CM | POA: Diagnosis not present

## 2017-05-29 DIAGNOSIS — Y939 Activity, unspecified: Secondary | ICD-10-CM | POA: Diagnosis not present

## 2017-05-29 DIAGNOSIS — F1029 Alcohol dependence with unspecified alcohol-induced disorder: Secondary | ICD-10-CM | POA: Diagnosis present

## 2017-05-29 DIAGNOSIS — W010XXA Fall on same level from slipping, tripping and stumbling without subsequent striking against object, initial encounter: Secondary | ICD-10-CM | POA: Diagnosis not present

## 2017-05-29 DIAGNOSIS — Z91010 Allergy to peanuts: Secondary | ICD-10-CM | POA: Diagnosis not present

## 2017-05-29 DIAGNOSIS — F329 Major depressive disorder, single episode, unspecified: Secondary | ICD-10-CM

## 2017-05-29 DIAGNOSIS — Z79899 Other long term (current) drug therapy: Secondary | ICD-10-CM | POA: Diagnosis not present

## 2017-05-29 DIAGNOSIS — F17210 Nicotine dependence, cigarettes, uncomplicated: Secondary | ICD-10-CM | POA: Diagnosis not present

## 2017-05-29 DIAGNOSIS — S0083XA Contusion of other part of head, initial encounter: Secondary | ICD-10-CM | POA: Diagnosis not present

## 2017-05-29 DIAGNOSIS — F1410 Cocaine abuse, uncomplicated: Secondary | ICD-10-CM | POA: Diagnosis present

## 2017-05-29 DIAGNOSIS — Z9114 Patient's other noncompliance with medication regimen: Secondary | ICD-10-CM | POA: Diagnosis not present

## 2017-05-29 DIAGNOSIS — R569 Unspecified convulsions: Secondary | ICD-10-CM

## 2017-05-29 DIAGNOSIS — F32A Depression, unspecified: Secondary | ICD-10-CM

## 2017-05-29 LAB — CBC
HEMATOCRIT: 45.1 % (ref 40.0–52.0)
Hemoglobin: 15.9 g/dL (ref 13.0–18.0)
MCH: 34.5 pg — AB (ref 26.0–34.0)
MCHC: 35.3 g/dL (ref 32.0–36.0)
MCV: 97.7 fL (ref 80.0–100.0)
Platelets: 186 10*3/uL (ref 150–440)
RBC: 4.62 MIL/uL (ref 4.40–5.90)
RDW: 14.7 % — ABNORMAL HIGH (ref 11.5–14.5)
WBC: 8.6 10*3/uL (ref 3.8–10.6)

## 2017-05-29 LAB — BASIC METABOLIC PANEL
Anion gap: 12 (ref 5–15)
BUN: 9 mg/dL (ref 6–20)
CHLORIDE: 103 mmol/L (ref 101–111)
CO2: 22 mmol/L (ref 22–32)
Calcium: 8.7 mg/dL — ABNORMAL LOW (ref 8.9–10.3)
Creatinine, Ser: 0.67 mg/dL (ref 0.61–1.24)
GFR calc non Af Amer: >60 mL/min (ref >60)
Glucose, Bld: 85 mg/dL (ref 65–99)
POTASSIUM: 3.7 mmol/L (ref 3.5–5.1)
SODIUM: 137 mmol/L (ref 135–145)

## 2017-05-29 LAB — VALPROIC ACID LEVEL: Valproic Acid Lvl: <10 ug/mL — ABNORMAL LOW (ref 50.0–100.0)

## 2017-05-29 LAB — ETHANOL: Alcohol, Ethyl (B): 244 mg/dL — ABNORMAL HIGH (ref <5)

## 2017-05-29 MED ORDER — SODIUM CHLORIDE 0.9 % IV SOLN
1000.0000 mg | INTRAVENOUS | Status: AC
  Administered 2017-05-29: 1000 mg via INTRAVENOUS
  Filled 2017-05-29: qty 10

## 2017-05-29 MED ORDER — NICOTINE 21 MG/24HR TD PT24
21.0000 mg | MEDICATED_PATCH | Freq: Once | TRANSDERMAL | Status: DC
  Administered 2017-05-29: 21 mg via TRANSDERMAL
  Filled 2017-05-29: qty 1

## 2017-05-29 MED ORDER — VALPROIC ACID 250 MG PO CAPS
750.0000 mg | ORAL_CAPSULE | Freq: Every day | ORAL | Status: DC
  Administered 2017-05-29: 750 mg via ORAL
  Filled 2017-05-29: qty 3

## 2017-05-29 MED ORDER — CITALOPRAM HYDROBROMIDE 20 MG PO TABS
20.0000 mg | ORAL_TABLET | Freq: Every day | ORAL | Status: DC
  Administered 2017-05-29 – 2017-05-30 (×2): 20 mg via ORAL
  Filled 2017-05-29 (×2): qty 1

## 2017-05-29 MED ORDER — CLONAZEPAM 0.5 MG PO TABS
0.5000 mg | ORAL_TABLET | Freq: Two times a day (BID) | ORAL | Status: DC
  Administered 2017-05-29 – 2017-05-30 (×3): 0.5 mg via ORAL
  Filled 2017-05-29 (×3): qty 1

## 2017-05-29 MED ORDER — TRAZODONE HCL 100 MG PO TABS
100.0000 mg | ORAL_TABLET | Freq: Every day | ORAL | Status: DC
  Administered 2017-05-29: 100 mg via ORAL
  Filled 2017-05-29: qty 1

## 2017-05-29 MED ORDER — VALPROIC ACID 250 MG PO CAPS: 750.0000 mg | ORAL_CAPSULE | Freq: Every day | ORAL | 1 refills | Status: DC

## 2017-05-29 MED ORDER — LIDOCAINE-EPINEPHRINE 2 %-1:100000 IJ SOLN
INTRAMUSCULAR | Status: AC
  Filled 2017-05-29: qty 1.7

## 2017-05-29 MED ORDER — VALPROIC ACID 250 MG PO CAPS
750.0000 mg | ORAL_CAPSULE | Freq: Once | ORAL | Status: AC
  Administered 2017-05-29: 750 mg via ORAL
  Filled 2017-05-29: qty 3

## 2017-05-29 NOTE — ED Notes (Signed)
Given lunch tray.

## 2017-05-29 NOTE — ED Notes (Signed)
Pt. Asking for male staff to come into room.  Pt. Told that this nurse would be his nurse for his stay in the ED.

## 2017-05-29 NOTE — ED Triage Notes (Signed)
Patient brought in by ems from home. Patient's mother found him on the floor. Patient has a history of seizures. Per ems patient had seizure type activity in route.

## 2017-05-29 NOTE — ED Triage Notes (Signed)
Patient states "I'd rather just die and get out of everyone else's way".  Patient states he is going to kill himself on the [place] out there".  Denies HI.  Patient just discharged from hospital 30 minutes ago.

## 2017-05-29 NOTE — BH Assessment (Signed)
Assessment Note  Alex [name] is an 41 y.o. male who presents to the ER voicing SI. Patient was recently discharged from the ER this morning (05/29/2017) due to similar presentation. Per the report of the patient, when he was discharged he was walking home to "[facility]" and "it was a long walk, so I decided to turn back around and come back here [facility])." When writer asked him, "you came back because you didn't want to walk?" He replied, "Naw naw, nothing like that. I had the thought of walking in traffic. So I decided I needed to come back and get checked out..."  During the interview, the patient was calm, cooperative and pleasant. He was able to provide appropriate answers to the questions. He denies history of aggression and violence.  Patient denies HI and AV/H. He initially denied SI but after having the conversation about walking home and returning back to the ER, he started voicing SI.    Diagnosis: Alcohol & Depression  Past Medical History:  Past Medical History:  Diagnosis Date  . Alcohol abuse   . Anxiety   . Cocaine abuse 05/27/2016  . Depression     Past Surgical History:  Procedure Laterality Date  . FINGER SURGERY      Family History: No family history on file.  Social History:  reports that he has been smoking Cigarettes.  He has been smoking about 1.00 pack per day. He has never used smokeless tobacco. He reports that he drinks about 25.2 oz of alcohol per week . He reports that he uses drugs, including Marijuana and Cocaine.  Additional Social History:  Alcohol / Drug Use Pain Medications: See PTA Prescriptions: See PTA Over the Counter: See PTA History of alcohol / drug use?: Yes Longest period of sobriety (when/how long): Unable to quantify Negative Consequences of Use: Personal relationships, Work / School Withdrawal Symptoms:  (n/a) Substance #1 Name of Substance 1: Alcohol 1 - Age of First Use: Unable to quantify 1 - Amount (size/oz): Unable to  quantify 1 - Frequency: Daily 1 - Duration: Unable to quantify 1 - Last Use / Amount: Unable to quantify Substance #2 Name of Substance 2: Cocaine  CIWA: CIWA-Ar BP: 123/89 Pulse Rate: 70 COWS:    Allergies:  Allergies  Allergen Reactions  . Peanut Butter Flavor     Home Medications:  (Not in a hospital admission)  OB/GYN Status:  No LMP for male patient.  General Assessment Data Assessment unable to be completed: Yes Location of Assessment: [facility] ED TTS Assessment: In system Is this a Tele or Face-to-Face Assessment?: Face-to-Face Is this an Initial Assessment or a Re-assessment for this encounter?: Initial Assessment Marital status: Single Maiden name: n/a Is patient pregnant?: No Living Arrangements: Parent Can pt return to current living arrangement?: Yes Admission Status: Involuntary Is patient capable of signing voluntary admission?: No Referral Source: Self/Family/Friend Insurance type: None  Medical Screening Exam [facility] Walk-in ONLY) Medical Exam completed: Yes  Crisis Care Plan Living Arrangements: Parent Legal Guardian: Other: (None) Name of Psychiatrist: Reports of none Name of Therapist: Reports of none  Education Status Is patient currently in school?: No Current Grade: n/a Highest grade of school patient has completed: 11th Name of school: n/a Contact person: n/a  Risk to self with the past 6 months Suicidal Ideation: No-Not Currently/Within Last 6 Months Has patient been a risk to self within the past 6 months prior to admission? : No Suicidal Intent: No Has patient had any suicidal intent  within the past 6 months prior to admission? : No Is patient at risk for suicide?: No Suicidal Plan?: No-Not Currently/Within Last 6 Months Has patient had any suicidal plan within the past 6 months prior to admission? : Yes Specify Current Suicidal Plan: Jump of bridge or walk into traffic Access to Means: Yes Specify Access to Suicidal Means: Bridges  and traffic What has been your use of drugs/alcohol within the last 12 months?: Alcohol & Cocaine Previous Attempts/Gestures: Yes How many times?: 5 Other Self Harm Risks: None Triggers for Past Attempts: Family contact Intentional Self Injurious Behavior: None Family Suicide History: No Recent stressful life event(s): Other (Comment), Conflict (Comment) Persecutory voices/beliefs?: No Depression: Yes Depression Symptoms: Guilt, Fatigue, Feeling worthless/self pity Substance abuse history and/or treatment for substance abuse?: Yes Suicide prevention information given to non-admitted patients: Not applicable  Risk to Others within the past 6 months Homicidal Ideation: No Does patient have any lifetime risk of violence toward others beyond the six months prior to admission? : No Thoughts of Harm to Others: No Current Homicidal Intent: No Current Homicidal Plan: No Access to Homicidal Means: No Identified Victim: Reports of none History of harm to others?: No Assessment of Violence: None Noted Violent Behavior Description: Reports of none Does patient have access to weapons?: No Does patient have a court date: No Is patient on probation?: No  Psychosis Hallucinations: None noted Delusions: None noted  Mental Status Report Appearance/Hygiene: Unremarkable, In scrubs Eye Contact: Poor Motor Activity: Freedom of movement, Unremarkable Speech: Logical/coherent, Unremarkable Level of Consciousness: Alert Mood: Depressed, Helpless, Sad, Pleasant Affect: Appropriate to circumstance, Depressed, Sad Anxiety Level: Minimal Thought Processes: Coherent, Relevant Judgement: Unimpaired Orientation: Person, Place, Time, Situation, Appropriate for developmental age Obsessive Compulsive Thoughts/Behaviors: Minimal  Cognitive Functioning Concentration: Normal Memory: Recent Intact, Remote Intact IQ: Average Insight: Fair Impulse Control: Fair Appetite: Fair Weight Loss: 0 Weight  Gain: 0 Sleep: No Change Total Hours of Sleep: 7 Vegetative Symptoms: None  ADLScreening [facility] Assessment Services) Patient's cognitive ability adequate to safely complete daily activities?: Yes Patient able to express need for assistance with ADLs?: Yes Independently performs ADLs?: Yes (appropriate for developmental age)  Prior Inpatient Therapy Prior Inpatient Therapy: Yes Prior Therapy Dates: 11/2016, 06/2016, 05/2016 & 07/2011 Prior Therapy Facilty/Provider(s): [facility], [facility] Reason for Treatment: SI and SA  Prior Outpatient Therapy Prior Outpatient Therapy: No Prior Therapy Dates: Reports of none Prior Therapy Facilty/Provider(s): Reports of none Reason for Treatment: Reports of none Does patient have an ACCT team?: No Does patient have Intensive In-House Services?  : No Does patient have [facility] services? : No Does patient have P4CC services?: No  ADL Screening (condition at time of admission) Patient's cognitive ability adequate to safely complete daily activities?: Yes Is the patient deaf or have difficulty hearing?: No Does the patient have difficulty seeing, even when wearing glasses/contacts?: No Does the patient have difficulty concentrating, remembering, or making decisions?: No Patient able to express need for assistance with ADLs?: Yes Does the patient have difficulty dressing or bathing?: No Independently performs ADLs?: Yes (appropriate for developmental age) Does the patient have difficulty walking or climbing stairs?: No Weakness of Legs: None Weakness of Arms/Hands: None  Home Assistive Devices/Equipment Home Assistive Devices/Equipment: None  Therapy Consults (therapy consults require a physician order) PT Evaluation Needed: No OT Evalulation Needed: No SLP Evaluation Needed: No Abuse/Neglect Assessment (Assessment to be complete while patient is alone) Physical Abuse: Denies Verbal Abuse: Denies Sexual Abuse: Denies Exploitation of  patient/patient's resources:  Denies Self-Neglect: Denies Values / Beliefs Cultural Requests During Hospitalization: None Spiritual Requests During Hospitalization: None Consults Spiritual Care Consult Needed: No Social Work Consult Needed: No Merchant navy officerAdvance Directives (For Healthcare) Does Patient Have a Medical Advance Directive?: No Would patient like information on creating a medical advance directive?: No - Patient declined    Additional Information 1:1 In Past 12 Months?: No CIRT Risk: No Elopement Risk: No Does patient have medical clearance?: Yes  Child/Adolescent Assessment Running Away Risk: Denies (Patient is an adult)  Disposition:  Disposition Initial Assessment Completed for this Encounter: Yes Disposition of Patient: Other dispositions (ER MD Ordered Psych Consult (SOC))  On Site Evaluation by:   Reviewed with Physician:    Lilyan Gilfordalvin J. Nasir Bright MS, LCAS, LPC, NCC, CCSI Therapeutic Triage Specialist 05/29/2017 2:32 PM

## 2017-05-29 NOTE — ED Notes (Signed)
Pt awaiting telepsych assessment

## 2017-05-29 NOTE — ED Notes (Signed)
Introduced self to pt, who was calm and cooperative. Pt did not answer questions about SI directly ("I don't know what I'm thinking.") but he did contract for safety. He denied HI and AVH. Pt was oriented to unit and urged to bring questions and concerns to staff. Will continue to monitor for needs/safety.

## 2017-05-29 NOTE — ED Provider Notes (Signed)
[facility] Emergency Department Provider Note   ____________________________________________   I have reviewed the triage vital signs and the nursing notes.   HISTORY  Chief Complaint Mental Health Problem   History limited by: Not Limited   HPI Alex [name] is a 41 y.o. male who presents to the emergency department today because of concern for suicidal thoughts and depression. The patient had been discharged from the emergency department less than one hour ago after a visit for an apparent seizure. He did not discuss any depression with the previous physician simply stating that it all started when he was walking out of the building. He did not feel that he could "do it" today. The patient does have a history of ER visits for psychiatric issues and suicidal ideation in the past, most recent evaluation by psychiatry 5 days ago. In terms of medical complaints only complaining of a slight headache at this time.    Past Medical History:  Diagnosis Date  . Alcohol abuse   . Anxiety   . Cocaine abuse 05/27/2016  . Depression     Patient Active Problem List   Diagnosis Date Noted  . Substance induced mood disorder (HCC) 12/30/2016  . Moderate recurrent major depression (HCC) 12/30/2016  . Cocaine abuse   . ADHD 08/11/2016  . Alcohol dependence with unspecified alcohol-induced disorder (HCC) 06/28/2016  . Alcohol withdrawal (HCC) 06/28/2016  . Cocaine use disorder, moderate, dependence (HCC) 06/28/2016  . Unspecified Depressive disorder 06/28/2016  . Tobacco use disorder 06/28/2016    Past Surgical History:  Procedure Laterality Date  . FINGER SURGERY      Prior to Admission medications   Medication Sig Start Date End Date Taking? Authorizing Provider  citalopram (CELEXA) 20 MG tablet Take 2 tablets (40 mg total) by mouth daily. 12/30/16   Clapacs, Jackquline DenmarkJohn T, MD  Multiple Vitamin (MULTIVITAMIN) tablet Take 1 tablet by mouth daily.    [provider]  omeprazole (PRILOSEC) 20 MG capsule Take 20 mg by mouth 2 (two) times daily before a meal.    [provider]  ondansetron (ZOFRAN) 4 MG tablet Take 4 mg by mouth every 8 (eight) hours as needed for nausea or vomiting.    [provider]  traZODone (DESYREL) 150 MG tablet Take 1 tablet (150 mg total) by mouth at bedtime. 12/30/16   Clapacs, Jackquline DenmarkJohn T, MD  valproic acid (DEPAKENE) 250 MG capsule Take 3 capsules (750 mg total) by mouth at bedtime. 05/29/17   [name], Cory, MD    Allergies Peanut butter flavor  No family history on file.  Social History Social History  Substance Use Topics  . Smoking status: Current Every Day Smoker    Packs/day: 1.00    Types: Cigarettes  . Smokeless tobacco: Never Used  . Alcohol use 25.2 oz/week    42 Cans of beer per week     Comment: pt admits to drinking 6+ beers a day    Review of Systems Constitutional: No fever/chills Eyes: No visual changes. ENT: No sore throat. Cardiovascular: Denies chest pain. Respiratory: Denies shortness of breath. Gastrointestinal: No abdominal pain.  No nausea, no vomiting.  No diarrhea.   Genitourinary: Negative for dysuria. Musculoskeletal: Negative for back pain. Skin: Negative for rash. Neurological: Positive for headache.   ____________________________________________   PHYSICAL EXAM:  VITAL SIGNS: ED Triage Vitals  Enc Vitals Group     BP 05/29/17 0735 123/89     Pulse Rate 05/29/17 0735 70  Resp 05/29/17 0735 16     Temp 05/29/17 0735 97.9 F (36.6 C)     Temp Source 05/29/17 0735 Oral     SpO2 05/29/17 0735 95 %     Weight 05/29/17 0734 190 lb (86.2 kg)     Height 05/29/17 0734 5\' 6"  (1.676 m)   Constitutional: Alert and oriented. Well appearing and in no distress. Eyes: Conjunctivae are normal.  ENT   Head: Normocephalic and atraumatic.   Nose: No congestion/rhinnorhea.   Mouth/Throat: Mucous membranes are moist.   Neck: No  stridor. Hematological/Lymphatic/Immunilogical: No cervical lymphadenopathy. Cardiovascular: Normal rate, regular rhythm.  No murmurs, rubs, or gallops. Respiratory: Normal respiratory effort without tachypnea nor retractions. Breath sounds are clear and equal bilaterally. No wheezes/rales/rhonchi. Gastrointestinal: Soft and non tender. No rebound. No guarding.  Genitourinary: Deferred Musculoskeletal: Normal range of motion in all extremities. No lower extremity edema. Neurologic:  Normal speech and language. No gross focal neurologic deficits are appreciated.  Skin:  Skin is warm, dry and intact. No rash noted. Psychiatric: Mood and affect are normal. Speech and behavior are normal. Patient exhibits appropriate insight and judgment.  ____________________________________________    LABS (pertinent positives/negatives)  Labs Reviewed - No data to display  ____________________________________________   EKG  None  ____________________________________________    RADIOLOGY  None  ____________________________________________   PROCEDURES  Procedures  ____________________________________________   INITIAL IMPRESSION / ASSESSMENT AND PLAN / ED COURSE  Pertinent labs & imaging results that were available during my care of the patient were reviewed by me and considered in my medical decision making (see chart for details).  Patient presented with concerns for depression and suicidal ideation. The patient was seen by specialist on call who recommended inpatient admission.  ____________________________________________   FINAL CLINICAL IMPRESSION(S) / ED DIAGNOSES  Depression  Note: This dictation was prepared with Dragon dictation. Any transcriptional errors that result from this process are unintentional     Phineas Semen, MD 05/29/17 1454

## 2017-05-29 NOTE — ED Notes (Signed)
Pt. Finished meal tray and drink.

## 2017-05-29 NOTE — ED Notes (Signed)
Report was received from Colin BroachKaren S., RN; Pt. Verbalizes  complaints orf having Depression;distress of having seizures and no transportation;  Verbalizes having S.I.; with no plan; denies having Hi. Continue to monitor with 15 min. Monitoring.

## 2017-05-29 NOTE — ED Notes (Signed)
Patient received PM snack. 

## 2017-05-29 NOTE — Discharge Instructions (Signed)
You were evaluated tonight for seizure-like activity.  This may be because she has not been taking your seizure medicine.  We gave you a dose of IV medication as well as your regular medication by mouth.  The rest of your results were normal except for your alcohol intoxication.  Please note that not taking her medications and taking drugs or alcohol can lead to seizures.  Take all of your regular medications and follow up with your primary care doctor on Tuesday.

## 2017-05-29 NOTE — ED Provider Notes (Signed)
[facility] Emergency Department Provider Note  ____________________________________________   First MD Initiated Contact with Patient 05/29/17 0023     (approximate)  I have reviewed the triage vital signs and the nursing notes.   HISTORY  Chief Complaint Seizures  Level 5 caveat:  history/ROS limited by active psychosis / mental illness / altered mental status   HPI Alex HousemanBrian J [name] is a 41 y.o. male well-known to the emergency department for psychiatric visits as well as alcohol and cocaine abuse.  He was last in the emergency department for psychiatric evaluation about 5 days ago.  He was brought in by EMS tonight after a possible seizure.  Reportedly his mother found him on the floor at home.  He has a contusion and abrasion to his forehead.  He states that he does not remember anything that happened and that he just woke up in the emergency department. his behavior is somewhat inappropriate particularly with the male staff but this is baseline for him.  He is in no acute distress.  At this time he denies any new symptoms, states that his abdomen always hurts and that it currently hurts.  He denies chest pain and shortness of breath.  He states that he often forgets to take his Depakote at night and that he has not had any in 3 or 4 days.  His medicines are present at bedside and they correspond with the medical record suggesting that he only takes Depakote, 750 mg each evening.  Of note he was reportedly having some seizure-like activity for EMS but they drips of normal saline and his eye and his corneal reflex was intact and he stopped his seizure behavior when this test was performed.   Past Medical History:  Diagnosis Date  . Alcohol abuse   . Anxiety   . Cocaine abuse 05/27/2016  . Depression     Patient Active Problem List   Diagnosis Date Noted  . Substance induced mood disorder (HCC) 12/30/2016  . Moderate recurrent major depression (HCC)  12/30/2016  . Cocaine abuse   . ADHD 08/11/2016  . Alcohol dependence with unspecified alcohol-induced disorder (HCC) 06/28/2016  . Alcohol withdrawal (HCC) 06/28/2016  . Cocaine use disorder, moderate, dependence (HCC) 06/28/2016  . Unspecified Depressive disorder 06/28/2016  . Tobacco use disorder 06/28/2016    Past Surgical History:  Procedure Laterality Date  . FINGER SURGERY      Prior to Admission medications   Medication Sig Start Date End Date Taking? Authorizing Provider  citalopram (CELEXA) 20 MG tablet Take 2 tablets (40 mg total) by mouth daily. 12/30/16   Clapacs, Jackquline DenmarkJohn T, MD  Multiple Vitamin (MULTIVITAMIN) tablet Take 1 tablet by mouth daily.    [provider]  omeprazole (PRILOSEC) 20 MG capsule Take 20 mg by mouth 2 (two) times daily before a meal.    [provider]  ondansetron (ZOFRAN) 4 MG tablet Take 4 mg by mouth every 8 (eight) hours as needed for nausea or vomiting.    [provider]  traZODone (DESYREL) 150 MG tablet Take 1 tablet (150 mg total) by mouth at bedtime. 12/30/16   Clapacs, Jackquline DenmarkJohn T, MD  valproic acid (DEPAKENE) 250 MG capsule Take 3 capsules (750 mg total) by mouth at bedtime. 05/29/17   Alex RoseForbach, [name], MD    Allergies Peanut butter flavor  No family history on file.  Social History Social History  Substance Use Topics  . Smoking status: Current Every Day Smoker  Packs/day: 1.00    Types: Cigarettes  . Smokeless tobacco: Never Used  . Alcohol use 25.2 oz/week    42 Cans of beer per week     Comment: pt admits to drinking 6+ beers a day    Review of Systems Constitutional: No fever/chills Eyes: No visual changes. ENT: No sore throat. Cardiovascular: Denies chest pain. Respiratory: Denies shortness of breath. Gastrointestinal: No abdominal pain.  No nausea, no vomiting.  No diarrhea.  No constipation. Genitourinary: Negative for dysuria. Musculoskeletal: Negative for neck pain.  Negative for back  pain. Integumentary: Negative for rash. abrasion and contusion to forehead Neurological: Negative for headaches, focal weakness or numbness. possible seizure-like activity   ____________________________________________   PHYSICAL EXAM:  VITAL SIGNS: ED Triage Vitals  Enc Vitals Group     BP 05/29/17 0005 129/79     Pulse Rate 05/29/17 0005 90     Resp 05/29/17 0005 18     Temp 05/29/17 0009 98.3 F (36.8 C)     Temp Source 05/29/17 0009 Oral     SpO2 05/29/17 0005 96 %     Weight 05/29/17 0005 86.2 kg (190 lb)     Height 05/29/17 0005 1.676 m (5\' 6" )     Head Circumference --      Peak Flow --      Pain Score --      Pain Loc --      Pain Edu? --      Excl. in GC? --     Constitutional: Alert and oriented. disheveled but generally Well appearing and in no acute distress. Eyes: Conjunctivae are normal. PERRL. EOMI. Head: small hematoma, abrasion, and contusion to forehead.  No injury to repair Nose: No congestion/rhinnorhea. Mouth/Throat: Mucous membranes are moist. Neck: No stridor.  No meningeal signs.  No cervical spine tenderness to palpation. Cardiovascular: Normal rate, regular rhythm. Good peripheral circulation. Grossly normal heart sounds. Respiratory: Normal respiratory effort.  No retractions. Lungs CTAB. Gastrointestinal: Soft with mild diffuse tenderness throughout the abdomen Musculoskeletal: No lower extremity tenderness nor edema. No gross deformities of extremities. Neurologic:  Normal speech and language. No gross focal neurologic deficits are appreciated.  Skin:  Skin is warm, dry and intact. No rash noted. Psychiatric: Mood and affect are at baseline for the patient and not grossly abnormal  ____________________________________________   LABS (all labs ordered are listed, but only abnormal results are displayed)  Labs Reviewed  BASIC METABOLIC PANEL - Abnormal; Notable for the following:       Result Value   Calcium 8.7 (*)    All other  components within normal limits  CBC - Abnormal; Notable for the following:    MCH 34.5 (*)    RDW 14.7 (*)    All other components within normal limits  VALPROIC ACID LEVEL - Abnormal; Notable for the following:    Valproic Acid Lvl <10 (*)    All other components within normal limits  ETHANOL - Abnormal; Notable for the following:    Alcohol, Ethyl (B) 244 (*)    All other components within normal limits   ____________________________________________  EKG  ED ECG REPORT I, Katryn Plummer, the attending physician, personally viewed and interpreted this ECG.  Date: 05/29/2017 EKG Time: 00:04 Rate: 84 Rhythm: normal sinus rhythm QRS Axis: LAD Intervals: normal ST/T Wave abnormalities: Non-specific ST segment / T-wave changes, but no evidence of acute ischemia. Narrative Interpretation: no evidence of acute ischemia    ____________________________________________  RADIOLOGY   Ct  Head Wo Contrast  Result Date: 05/29/2017 CLINICAL DATA:  Altered mental status.  Possible postictal. EXAM: CT HEAD WITHOUT CONTRAST TECHNIQUE: Contiguous axial images were obtained from the base of the skull through the vertex without intravenous contrast. COMPARISON:  05/24/2017 FINDINGS: Brain: There is no intracranial hemorrhage, mass or evidence of acute infarction. There is no extra-axial fluid collection. Gray matter and white matter appear normal. Cerebral volume is normal for age. Brainstem and posterior fossa are unremarkable. The CSF spaces appear normal. Vascular: No hyperdense vessel or unexpected calcification. Skull: Normal. Negative for fracture or focal lesion. Sinuses/Orbits: No acute finding. Other: None. IMPRESSION: Normal brain Electronically Signed   By: Ellery Plunk M.D.   On: 05/29/2017 01:36    ____________________________________________   PROCEDURES  Critical Care performed: No   Procedure(s) performed:    Procedures   ____________________________________________   INITIAL IMPRESSION / ASSESSMENT AND PLAN / ED COURSE  Pertinent labs & imaging results that were available during my care of the patient were reviewed by me and considered in my medical decision making (see chart for details).  the patient is well-known to the emergency department.  He was having seizure-like activity but is unclear if it was voluntary or not given that some normal saline in his eye by EMS resulted in an appropriate corneal reflex and a discontinuation of the seizure-like activity.  However he is known to be noncompliant with his Depakote.  I abdomen it on a Depakote level and I will give him 1 g of Keppra for seizure prophylaxis.  Given his obvious head injury and the difficulty in getting the full story, I will obtain a CT scan of his head to make sure he does not have an acute intracranial injury or other intracranial abnormality. I anticipate he will be discharged home with outpatient follow-up.   Clinical Course as of May 30 843  Sun May 29, 2017  0145 Reassuring radiology report with no acute findings CT Head Wo Contrast [CF]  0223 Undetectable valproic acid level and elevated ETOH level.  Ordering home dose of Depakene.  [CF]  [phone] The patient has been sleeping all night.  He should be clinically sober at this time.  We will start the process of awakening him and finding him a ride home.  [CF]    Clinical Course User Index [CF] Alex Rose, MD    ____________________________________________  FINAL CLINICAL IMPRESSION(S) / ED DIAGNOSES  Final diagnoses:  Seizure-like activity (HCC)  Contusion of forehead, initial encounter  Non compliance w medication regimen  Alcoholic intoxication with complication (HCC)     MEDICATIONS GIVEN DURING THIS VISIT:  Medications  levETIRAcetam (KEPPRA) 1,000 mg in sodium chloride 0.9 % 100 mL IVPB (0 mg Intravenous Stopped 05/29/17 0229)  valproic acid  (DEPAKENE) 250 MG capsule 750 mg (750 mg Oral Given 05/29/17 0315)     NEW OUTPATIENT MEDICATIONS STARTED DURING THIS VISIT:  Discharge Medication List as of 05/29/2017  6:37 AM      Discharge Medication List as of 05/29/2017  6:37 AM    CONTINUE these medications which have CHANGED   Details  valproic acid (DEPAKENE) 250 MG capsule Take 3 capsules (750 mg total) by mouth at bedtime., Starting Sun 05/29/2017, Print        Discharge Medication List as of 05/29/2017  6:37 AM       Note:  This document was prepared using Dragon voice recognition software and may include unintentional dictation errors.    Alex Rose, MD  05/29/17 0844  

## 2017-05-29 NOTE — ED Notes (Signed)
Pt. Asked and was given meal tray with drink.

## 2017-05-29 NOTE — ED Notes (Signed)
Pt. States he has not taken his medication in the past 3-4 days.  When patient was asked why he has not taken his medications, Pt. Stated "sometimes I forget".

## 2017-05-29 NOTE — ED Notes (Signed)
Pt. Returned from CT.

## 2017-05-29 NOTE — ED Notes (Signed)
Called pharmacy for Keppra, Pharmacy stated they are mixing up medication, stated will send when available.

## 2017-05-29 NOTE — ED Notes (Signed)
EMS report pt. Had seizure like activity in route to ED.

## 2017-05-29 NOTE — ED Notes (Addendum)
[facility]. Pt went with all of belongings.

## 2017-05-29 NOTE — ED Notes (Signed)
Pt. Going home by self.  Will call for ride.

## 2017-05-29 NOTE — ED Notes (Signed)
Pt states that he felt fine when he left ER this AM. When he was walking home, he felt urge to walk into traffic. Did not attempt, came back to hospital.

## 2017-05-29 NOTE — ED Notes (Signed)

## 2017-05-30 DIAGNOSIS — F1994 Other psychoactive substance use, unspecified with psychoactive substance-induced mood disorder: Secondary | ICD-10-CM | POA: Diagnosis not present

## 2017-05-30 NOTE — ED Notes (Signed)
Patient taking a shower.

## 2017-05-30 NOTE — Consult Note (Signed)
[facility] Face-to-Face Psychiatry Consult   Reason for Consult:  Consult for 41 year old man with a history of alcohol abuse cocaine abuse and depression who came to the hospital twice in a row this weekend. Now requesting discharge. Referring Physician:  Archie Balboa Patient Identification: Alex [name] MRN:  [id number] Principal Diagnosis: Substance induced mood disorder [facility]) Diagnosis:   Patient Active Problem List   Diagnosis Date Noted  . Substance induced mood disorder ([place]) [F19.94] 12/30/2016  . Moderate recurrent major depression ([place]) [F33.1] 12/30/2016  . Cocaine abuse [F14.10]   . ADHD [F90.9] 08/11/2016  . Alcohol dependence with unspecified alcohol-induced disorder ([place]) [F10.29] 06/28/2016  . Alcohol withdrawal ([place]) [F10.239] 06/28/2016  . Cocaine use disorder, moderate, dependence ([place]) [F14.20] 06/28/2016  . Unspecified Depressive disorder [F32.9] 06/28/2016  . Tobacco use disorder [F17.200] 06/28/2016    Total Time spent with patient: 1 hour  Subjective:   Alex [name] is a 41 y.o. male patient admitted with "I'm ready to go now".  HPI:  Patient interviewed chart reviewed. Patient initially came to the emergency room very early in the morning of Sunday. It was reported that they found him having a seizure at home and EMS reported more possible seizures on his way over here. Patient was stabilized and evaluated and eventually discharged around 7:00 in the morning yesterday but returned to the hospital only 30 minutes later saying that he was having thoughts of walking out into traffic. Patient now tells me that he has no suicidal thoughts and never did have any suicidal thoughts that he only came back to the hospital because he realized what a long walk it would be in the heat to get back to his home. Patient admits that he has continued to drink intermittently. He claims that he was drinking "just a little bit" before coming to the hospital and tends to minimize it and says that  he's been using "only a little cocaine" but he did have a blood alcohol level over 240 and even then was having seizures. He admits that he has been partially noncompliant with his outpatient seizure medicine. Patient denies to me that he's been feeling particularly depressed. He denies any suicidal or homicidal thoughts at all. Denies any wish to die. Denies any psychotic symptoms. He is irritable and a little agitated which is his normal baseline once he sobers up. Very impatient but that is also his normal baseline.  Social history: Patient lives out in [place] with his mother. Works only intermittently.  Medical history: History of seizures although he is prescribed antiseizure medicine most of them seem to be related to alcohol abuse and cocaine abuse.  Substance abuse history: Long-standing history of alcohol abuse and cocaine abuse. Every time he gets intoxicated it seems to make his mood out of control and he winds up coming into the hospital.  Past Psychiatric History: Multiple visits to the emergency room and several hospitalizations. Has been on citalopram in the past unclear how helpful it is been. Rarely participates in appropriate outpatient treatment. Has been very resistant to follow-up with the providers at [facility]. Frequently will threaten to kill himself by jumping off a bridge while intoxicated.  Risk to Self: Suicidal Ideation: No-Not Currently/Within Last 6 Months Suicidal Intent: No Is patient at risk for suicide?: No Suicidal Plan?: No-Not Currently/Within Last 6 Months Specify Current Suicidal Plan: Jump of bridge or walk into traffic Access to Means: Yes Specify Access to Suicidal Means: Bridges and traffic What has been your use  of drugs/alcohol within the last 12 months?: Alcohol & Cocaine How many times?: 5 Other Self Harm Risks: None Triggers for Past Attempts: Family contact Intentional Self Injurious Behavior: None Risk to Others: Homicidal Ideation: No Thoughts  of Harm to Others: No Current Homicidal Intent: No Current Homicidal Plan: No Access to Homicidal Means: No Identified Victim: Reports of none History of harm to others?: No Assessment of Violence: None Noted Violent Behavior Description: Reports of none Does patient have access to weapons?: No Does patient have a court date: No Prior Inpatient Therapy: Prior Inpatient Therapy: Yes Prior Therapy Dates: 11/2016, 06/2016, 05/2016 & 07/2011 Prior Therapy Facilty/Provider(s): [facility], [facility] Reason for Treatment: SI and SA Prior Outpatient Therapy: Prior Outpatient Therapy: No Prior Therapy Dates: Reports of none Prior Therapy Facilty/Provider(s): Reports of none Reason for Treatment: Reports of none Does patient have an ACCT team?: No Does patient have Intensive In-House Services?  : No Does patient have [facility] services? : No Does patient have P4CC services?: No  Past Medical History:  Past Medical History:  Diagnosis Date  . Alcohol abuse   . Anxiety   . Cocaine abuse 05/27/2016  . Depression     Past Surgical History:  Procedure Laterality Date  . FINGER SURGERY     Family History: No family history on file. Family Psychiatric  History: Substance abuse Social History:  History  Alcohol Use  . 25.2 oz/week  . 2 Cans of beer per week    Comment: pt admits to drinking 6+ beers a day     History  Drug Use  . Types: Marijuana, Cocaine    Comment: cocaine today    Social History   Social History  . Marital status: Single    Spouse name: N/A  . Number of children: N/A  . Years of education: N/A   Social History Main Topics  . Smoking status: Current Every Day Smoker    Packs/day: 1.00    Types: Cigarettes  . Smokeless tobacco: Never Used  . Alcohol use 25.2 oz/week    42 Cans of beer per week     Comment: pt admits to drinking 6+ beers a day  . Drug use: Yes    Types: Marijuana, Cocaine     Comment: cocaine today  . Sexual activity: Not Asked   Other  Topics Concern  . None   Social History Narrative  . None   Additional Social History:    Allergies:   Allergies  Allergen Reactions  . Peanut Butter Flavor     Labs:  Results for orders placed or performed during the hospital encounter of 05/29/17 (from the past 48 hour(s))  Basic metabolic panel - if new onset seizures     Status: Abnormal   Collection Time: 05/29/17 12:10 AM  Result Value Ref Range   Sodium 137 135 - 145 mmol/L   Potassium 3.7 3.5 - 5.1 mmol/L   Chloride 103 101 - 111 mmol/L   CO2 22 22 - 32 mmol/L   Glucose, Bld 85 65 - 99 mg/dL   BUN 9 6 - 20 mg/dL   Creatinine, Ser 0.67 0.61 - 1.24 mg/dL   Calcium 8.7 (L) 8.9 - 10.3 mg/dL   GFR calc non Af Amer >60 >60 mL/min   GFR calc Af Amer >60 >60 mL/min    Comment: (NOTE) The eGFR has been calculated using the CKD EPI equation. This calculation has not been validated in all clinical situations. eGFR's persistently <60 mL/min signify  possible Chronic Kidney Disease.    Anion gap 12 5 - 15  CBC - if new onset seizures     Status: Abnormal   Collection Time: 05/29/17 12:10 AM  Result Value Ref Range   WBC 8.6 3.8 - 10.6 K/uL   RBC 4.62 4.40 - 5.90 MIL/uL   Hemoglobin 15.9 13.0 - 18.0 g/dL   HCT 45.1 40.0 - 52.0 %   MCV 97.7 80.0 - 100.0 fL   MCH 34.5 (H) 26.0 - 34.0 pg   MCHC 35.3 32.0 - 36.0 g/dL   RDW 14.7 (H) 11.5 - 14.5 %   Platelets 186 150 - 440 K/uL  Valproic acid level     Status: Abnormal   Collection Time: 05/29/17 12:10 AM  Result Value Ref Range   Valproic Acid Lvl <10 (L) 50.0 - 100.0 ug/mL    Comment: RESULT CONFIRMED BY MANUAL DILUTION/RWW  Ethanol     Status: Abnormal   Collection Time: 05/29/17 12:10 AM  Result Value Ref Range   Alcohol, Ethyl (B) 244 (H) <5 mg/dL    Comment:        LOWEST DETECTABLE LIMIT FOR SERUM ALCOHOL IS 5 mg/dL FOR MEDICAL PURPOSES ONLY     Current Facility-Administered Medications  Medication Dose Route Frequency Provider Last Rate Last Dose  .  citalopram (CELEXA) tablet 20 mg  20 mg Oral Daily Nance Pear, MD   20 mg at 05/30/17 0950  . clonazePAM (KLONOPIN) tablet 0.5 mg  0.5 mg Oral BID Nance Pear, MD   0.5 mg at 05/30/17 0950  . nicotine (NICODERM CQ - dosed in mg/24 hours) patch 21 mg  21 mg Transdermal Once Carrie Mew, MD   21 mg at 05/29/17 2151  . traZODone (DESYREL) tablet 100 mg  100 mg Oral QHS Nance Pear, MD   100 mg at 05/29/17 2152  . valproic acid (DEPAKENE) 250 MG capsule 750 mg  750 mg Oral QHS Nance Pear, MD   750 mg at 05/29/17 2152   Current Outpatient Prescriptions  Medication Sig Dispense Refill  . citalopram (CELEXA) 20 MG tablet Take 2 tablets (40 mg total) by mouth daily. 30 tablet 1  . Multiple Vitamin (MULTIVITAMIN) tablet Take 1 tablet by mouth daily.    Marland Kitchen omeprazole (PRILOSEC) 20 MG capsule Take 20 mg by mouth 2 (two) times daily before a meal.    . traZODone (DESYREL) 150 MG tablet Take 1 tablet (150 mg total) by mouth at bedtime. 30 tablet 1  . valproic acid (DEPAKENE) 250 MG capsule Take 3 capsules (750 mg total) by mouth at bedtime. 90 capsule 1  . ondansetron (ZOFRAN) 4 MG tablet Take 4 mg by mouth every 8 (eight) hours as needed for nausea or vomiting.      Musculoskeletal: Strength & Muscle Tone: within normal limits Gait & Station: normal Patient leans: N/A  Psychiatric Specialty Exam: Physical Exam  Nursing note and vitals reviewed. Constitutional: He appears well-developed and well-nourished.  HENT:  Head: Normocephalic and atraumatic.  Eyes: Pupils are equal, round, and reactive to light. Conjunctivae are normal.  Neck: Normal range of motion.  Cardiovascular: Regular rhythm and normal heart sounds.   Respiratory: Effort normal. No respiratory distress.  GI: Soft.  Musculoskeletal: Normal range of motion.  Neurological: He is alert.  Skin: Skin is warm and dry.  Psychiatric: He has a normal mood and affect. His speech is rapid and/or pressured. He is  agitated. Thought content is not paranoid and not delusional. Cognition  and memory are normal. He expresses impulsivity. He expresses no homicidal and no suicidal ideation.    Review of Systems  Constitutional: Negative.   HENT: Negative.   Eyes: Negative.   Respiratory: Negative.   Cardiovascular: Negative.   Gastrointestinal: Negative.   Musculoskeletal: Negative.   Skin: Negative.   Neurological: Positive for seizures.  Psychiatric/Behavioral: Positive for substance abuse. Negative for depression, hallucinations, memory loss and suicidal ideas. The patient is not nervous/anxious and does not have insomnia.     Blood pressure (!) 142/99, pulse 72, temperature (!) 97.5 F (36.4 C), temperature source Oral, resp. rate 20, height 5' 6"  (1.676 m), weight 190 lb (86.2 kg), SpO2 100 %.Body mass index is 30.67 kg/m.  General Appearance: Casual  Eye Contact:  Good  Speech:  Clear and Coherent  Volume:  Increased  Mood:  Euphoric  Affect:  Congruent  Thought Process:  Goal Directed  Orientation:  Full (Time, Place, and Person)  Thought Content:  Tangential  Suicidal Thoughts:  No  Homicidal Thoughts:  No  Memory:  Immediate;   Fair Recent;   Fair Remote;   Fair  Judgement:  Fair  Insight:  Fair  Psychomotor Activity:  Restlessness  Concentration:  Concentration: Good  Recall:  [place] of Knowledge:  Fair  Language:  Fair  Akathisia:  No  Handed:  Right  AIMS (if indicated):     Assets:  Communication Skills Desire for Improvement Housing Physical Health Resilience Social Support  ADL's:  Intact  Cognition:  Impaired,  Mild  Sleep:        Treatment Plan Summary: Plan 42 year old man who was discharged from the hospital and immediately came back talking about wanting to walk out into traffic. He had not had any suicidal thoughts before that. He very typically report suicidal thoughts and knows that they can be used manipulatively. He is very clearly stating now that he  only wanted to come back to the hospital because he didn't have a ride yesterday. Patient completely denies any suicidal thoughts at all now. Says he is looking forward to getting home and getting back to trying to find work. Appears to be lucid and not psychotic. As usual counseled him about how medicines are only partially helpful if he continues to drink. Strongly encouraged follow-up with local mental health through [facility]. Discontinue IVC. Case reviewed with emergency room physician. He can be discharged home.  Disposition: No evidence of imminent risk to self or others at present.   Patient does not meet criteria for psychiatric inpatient admission. Supportive therapy provided about ongoing stressors.  Alethia Berthold, MD 05/30/2017 12:43 PM

## 2017-05-30 NOTE — Discharge Instructions (Signed)
Please seek medical attention and help for any thoughts about wanting to harm yourself, harm others, any concerning change in behavior, severe depression, inappropriate drug use or any other new or concerning symptoms. ° °

## 2017-05-30 NOTE — ED Provider Notes (Signed)
-----------------------------------------   11:50 AM on 05/30/2017 -----------------------------------------  Dr. Toni Amendlapacs with psychiatry has evaluated the patient.feels he is safe for discharge and outpatient follow-up.   Phineas SemenGoodman, Caroll Weinheimer, MD 05/30/17 1150

## 2017-05-30 NOTE — ED Notes (Signed)
Patient is up and cursing and talking loud, " call the doctor, my friend can pick me up, I need to get out of here" I need to see that doctor" Patient encouraged to calm down, nurse did give him He medication that was ordered, Patient took without hesitation. Patient is pacing at times, will continue to monitor, patient denies si/hi or avh. Q 15 minute checks and camera surveillance in progress for safety.

## 2017-05-30 NOTE — ED Provider Notes (Signed)
-----------------------------------------   6:06 AM on 05/30/2017 -----------------------------------------   Blood pressure 136/83, pulse 69, temperature (!) 97.5 F (36.4 C), temperature source Oral, resp. rate 16, height 5\' 6"  (1.676 m), weight 86.2 kg (190 lb), SpO2 99 %.  The patient had no acute events since last update.  Sleeping at this time.  Disposition is pending Psychiatry/Behavioral Medicine team recommendations.     Irean HongSung, Zaccheaus Storlie J, MD 05/30/17 [phone]

## 2017-05-30 NOTE — ED Notes (Signed)
Patient alert and oriented, voices understanding of discharge instructions, all belongings given back to patient. Patient called for transportation home.

## 2017-05-30 NOTE — ED Notes (Signed)
Patient ate 100% of lunch and beverage.  

## 2017-05-30 NOTE — ED Notes (Signed)
Referral information for Placement have been faxed to;      [facility] ([phone]),    [name] [phone]),    [name] [phone]),    [facility] [phone]),    Alex [name] [phone])

## 2017-06-07 ENCOUNTER — Encounter: Payer: Self-pay | Admitting: *Deleted

## 2017-06-07 ENCOUNTER — Emergency Department: Payer: Medicaid Other

## 2017-06-07 ENCOUNTER — Emergency Department
Admission: EM | Admit: 2017-06-07 | Discharge: 2017-06-07 | Disposition: A | Discharge status: Home / Self Care | Payer: Medicaid Other | Attending: Emergency Medicine | Admitting: Emergency Medicine

## 2017-06-07 DIAGNOSIS — F1910 Other psychoactive substance abuse, uncomplicated: Secondary | ICD-10-CM

## 2017-06-07 DIAGNOSIS — F17210 Nicotine dependence, cigarettes, uncomplicated: Secondary | ICD-10-CM | POA: Insufficient documentation

## 2017-06-07 DIAGNOSIS — Z79899 Other long term (current) drug therapy: Secondary | ICD-10-CM | POA: Insufficient documentation

## 2017-06-07 DIAGNOSIS — F1410 Cocaine abuse, uncomplicated: Secondary | ICD-10-CM | POA: Diagnosis not present

## 2017-06-07 DIAGNOSIS — F69 Unspecified disorder of adult personality and behavior: Secondary | ICD-10-CM | POA: Insufficient documentation

## 2017-06-07 DIAGNOSIS — R45850 Homicidal ideations: Secondary | ICD-10-CM | POA: Diagnosis not present

## 2017-06-07 DIAGNOSIS — F1994 Other psychoactive substance use, unspecified with psychoactive substance-induced mood disorder: Secondary | ICD-10-CM | POA: Diagnosis present

## 2017-06-07 DIAGNOSIS — F1420 Cocaine dependence, uncomplicated: Secondary | ICD-10-CM | POA: Diagnosis present

## 2017-06-07 DIAGNOSIS — R45851 Suicidal ideations: Secondary | ICD-10-CM | POA: Insufficient documentation

## 2017-06-07 DIAGNOSIS — F10129 Alcohol abuse with intoxication, unspecified: Secondary | ICD-10-CM | POA: Insufficient documentation

## 2017-06-07 DIAGNOSIS — F10920 Alcohol use, unspecified with intoxication, uncomplicated: Secondary | ICD-10-CM

## 2017-06-07 DIAGNOSIS — F1029 Alcohol dependence with unspecified alcohol-induced disorder: Secondary | ICD-10-CM | POA: Diagnosis present

## 2017-06-07 DIAGNOSIS — Z91010 Allergy to peanuts: Secondary | ICD-10-CM | POA: Insufficient documentation

## 2017-06-07 LAB — CBC
HCT: 44.5 % (ref 40.0–52.0)
HEMOGLOBIN: 15.7 g/dL (ref 13.0–18.0)
MCH: 35 pg — ABNORMAL HIGH (ref 26.0–34.0)
MCHC: 35.4 g/dL (ref 32.0–36.0)
MCV: 98.9 fL (ref 80.0–100.0)
Platelets: 172 10*3/uL (ref 150–440)
RBC: 4.5 MIL/uL (ref 4.40–5.90)
RDW: 14.4 % (ref 11.5–14.5)
WBC: 7.4 10*3/uL (ref 3.8–10.6)

## 2017-06-07 LAB — COMPREHENSIVE METABOLIC PANEL
ALT: 24 U/L (ref 17–63)
ANION GAP: 11 (ref 5–15)
AST: 33 U/L (ref 15–41)
Albumin: 4.2 g/dL (ref 3.5–5.0)
Alkaline Phosphatase: 81 U/L (ref 38–126)
BILIRUBIN TOTAL: 0.4 mg/dL (ref 0.3–1.2)
BUN: 9 mg/dL (ref 6–20)
CO2: 23 mmol/L (ref 22–32)
Calcium: 8.9 mg/dL (ref 8.9–10.3)
Chloride: 105 mmol/L (ref 101–111)
Creatinine, Ser: 0.93 mg/dL (ref 0.61–1.24)
Glucose, Bld: 98 mg/dL (ref 65–99)
POTASSIUM: 4 mmol/L (ref 3.5–5.1)
Sodium: 139 mmol/L (ref 135–145)
TOTAL PROTEIN: 7.6 g/dL (ref 6.5–8.1)

## 2017-06-07 LAB — URINE DRUG SCREEN, QUALITATIVE (ARMC ONLY)
AMPHETAMINES, UR SCREEN: NOT DETECTED
BARBITURATES, UR SCREEN: NOT DETECTED
BENZODIAZEPINE, UR SCRN: NOT DETECTED
Cannabinoid 50 Ng, Ur ~~LOC~~: POSITIVE — AB
Cocaine Metabolite,Ur ~~LOC~~: POSITIVE — AB
MDMA (Ecstasy)Ur Screen: NOT DETECTED
METHADONE SCREEN, URINE: NOT DETECTED
Opiate, Ur Screen: NOT DETECTED
Phencyclidine (PCP) Ur S: NOT DETECTED
TRICYCLIC, UR SCREEN: NOT DETECTED

## 2017-06-07 LAB — ACETAMINOPHEN LEVEL

## 2017-06-07 LAB — SALICYLATE LEVEL

## 2017-06-07 LAB — ETHANOL: Alcohol, Ethyl (B): 248 mg/dL — ABNORMAL HIGH (ref <5)

## 2017-06-07 LAB — TROPONIN I

## 2017-06-07 NOTE — BH Assessment (Signed)
Assessment Note  Alex [name] is an 41 y.o. male presenting to the ED, under IVC, for intoxication and suicidal ideations with thoughts of jumping off a bridge.  Patient has had numerous presentations to the ED for the same.  Patient has a history of alcohol and drug abuse.  His BAC upon arrival was 248.  His UDS was positive for cocaine and marijuana.  Patient reports ongoing conflict with his mother and states that he needs to find his own place to stay.  Pt currently denies SI/HI while in the ED.  Diagnosis: Alcohol Abuse  Past Medical History:  Past Medical History:  Diagnosis Date  . Alcohol abuse   . Anxiety   . Cocaine abuse 05/27/2016  . Depression     Past Surgical History:  Procedure Laterality Date  . FINGER SURGERY      Family History: No family history on file.  Social History:  reports that he has been smoking Cigarettes.  He has been smoking about 1.00 pack per day. He has never used smokeless tobacco. He reports that he drinks about 25.2 oz of alcohol per week . He reports that he uses drugs, including Marijuana and Cocaine.  Additional Social History:  Alcohol / Drug Use Pain Medications: See PTA Prescriptions: See PTA Over the Counter: See PTA Substance #1 Name of Substance 1: ETOH  CIWA: CIWA-Ar BP: 111/76 Pulse Rate: 84 COWS:    Allergies:  Allergies  Allergen Reactions  . Peanut Butter Flavor     Home Medications:  (Not in a hospital admission)  OB/GYN Status:  No LMP for male patient.  General Assessment Data Location of Assessment: [facility] ED TTS Assessment: In system Is this a Tele or Face-to-Face Assessment?: Face-to-Face Is this an Initial Assessment or a Re-assessment for this encounter?: Initial Assessment Marital status: Single Maiden name: n/a Is patient pregnant?: No Living Arrangements: Parent Can pt return to current living arrangement?: Yes Admission Status: Involuntary Is patient capable of signing voluntary admission?:  No Referral Source: Self/Family/Friend Insurance type: Medicaid     Crisis Care Plan Living Arrangements: Parent Legal Guardian: Mother Alex [name], mother) Name of Psychiatrist: Reports of none Name of Therapist: Reports of none  Education Status Is patient currently in school?: No Current Grade: n/a Highest grade of school patient has completed: 11th Name of school: n/a Contact person: n/a  Risk to self with the past 6 months Suicidal Ideation: Yes-Currently Present Has patient been a risk to self within the past 6 months prior to admission? : No Suicidal Intent: No Has patient had any suicidal intent within the past 6 months prior to admission? : No Is patient at risk for suicide?: No Suicidal Plan?: No Has patient had any suicidal plan within the past 6 months prior to admission? : Yes Access to Means: Yes Specify Access to Suicidal Means: Pt can get to bridge to jump What has been your use of drugs/alcohol within the last 12 months?: alcohol and cocaine Previous Attempts/Gestures: Yes How many times?: 5 Other Self Harm Risks: none Triggers for Past Attempts: Family contact Intentional Self Injurious Behavior: None Family Suicide History: No Recent stressful life event(s): Conflict (Comment) (conflict with mother) Persecutory voices/beliefs?: No Depression: Yes Depression Symptoms: Feeling worthless/self pity, Feeling angry/irritable, Loss of interest in usual pleasures Substance abuse history and/or treatment for substance abuse?: Yes Suicide prevention information given to non-admitted patients: Not applicable  Risk to Others within the past 6 months Homicidal Ideation: No Does patient have any lifetime  risk of violence toward others beyond the six months prior to admission? : No Thoughts of Harm to Others: No Current Homicidal Intent: No Current Homicidal Plan: No Access to Homicidal Means: No Identified Victim: None identified History of harm to  others?: No Assessment of Violence: None Noted Does patient have access to weapons?: No Criminal Charges Pending?: No Does patient have a court date: No Is patient on probation?: Yes  Psychosis Hallucinations: None noted Delusions: None noted  Mental Status Report Appearance/Hygiene: Unremarkable, In scrubs Eye Contact: Good Motor Activity: Freedom of movement Speech: Logical/coherent, Unremarkable Level of Consciousness: Alert Mood: Irritable Affect: Appropriate to circumstance, Depressed, Sad Anxiety Level: Minimal Thought Processes: Relevant Judgement: Partial Orientation: Person, Place, Time, Situation, Appropriate for developmental age Obsessive Compulsive Thoughts/Behaviors: None  Cognitive Functioning Concentration: Normal Memory: Recent Intact, Remote Intact IQ: Average Insight: Fair Impulse Control: Fair Appetite: Good Weight Loss: 0 Weight Gain: 0 Sleep: No Change Total Hours of Sleep: 7  ADLScreening [facility] Assessment Services) Patient's cognitive ability adequate to safely complete daily activities?: Yes Patient able to express need for assistance with ADLs?: Yes Independently performs ADLs?: Yes (appropriate for developmental age)  Prior Inpatient Therapy Prior Inpatient Therapy: Yes Prior Therapy Dates: 11/2016, 06/2016, 05/2016 & 07/2011 Prior Therapy Facilty/Provider(s): [facility], [facility] Reason for Treatment: SI and SA  Prior Outpatient Therapy Prior Outpatient Therapy: No Prior Therapy Dates: Reports of none Prior Therapy Facilty/Provider(s): Reports of none Reason for Treatment: Reports of none Does patient have an ACCT team?: No Does patient have Intensive In-House Services?  : No Does patient have [facility] services? : No Does patient have P4CC services?: No  ADL Screening (condition at time of admission) Patient's cognitive ability adequate to safely complete daily activities?: Yes Patient able to express need for assistance with ADLs?:  Yes Independently performs ADLs?: Yes (appropriate for developmental age)       Abuse/Neglect Assessment (Assessment to be complete while patient is alone) Physical Abuse: Denies Verbal Abuse: Denies Sexual Abuse: Denies Exploitation of patient/patient's resources: Denies Self-Neglect: Denies Values / Beliefs Cultural Requests During Hospitalization: None Spiritual Requests During Hospitalization: None Consults Spiritual Care Consult Needed: No Social Work Consult Needed: No Merchant navy officerAdvance Directives (For Healthcare) Does Patient Have a Medical Advance Directive?: No    Additional Information 1:1 In Past 12 Months?: No CIRT Risk: No Elopement Risk: No Does patient have medical clearance?: Yes     Disposition:  Disposition Initial Assessment Completed for this Encounter: Yes Disposition of Patient: Other dispositions (ER MD Ordered Psych Consult (SOC)) Other disposition(s): Other (Comment) (Pending Psych MD consult)  On Site Evaluation by:   Reviewed with Physician:    Artist Beachoxana C Andrej Spagnoli 06/07/2017 7:12 AM

## 2017-06-07 NOTE — Consult Note (Signed)
[facility] Face-to-Face Psychiatry Consult   Reason for Consult:  Consult for 41 year old man with a history of substance abuse and mood disorder who came to the emergency room after threatening suicide. Referring Physician:  Jimmye Norman Patient Identification: Alex [name] MRN:  [id number] Principal Diagnosis: Substance induced mood disorder [facility]) Diagnosis:   Patient Active Problem List   Diagnosis Date Noted  . Substance induced mood disorder ([place]) [F19.94] 12/30/2016  . Moderate recurrent major depression ([place]) [F33.1] 12/30/2016  . Cocaine abuse [F14.10]   . ADHD [F90.9] 08/11/2016  . Alcohol dependence with unspecified alcohol-induced disorder ([place]) [F10.29] 06/28/2016  . Alcohol withdrawal ([place]) [F10.239] 06/28/2016  . Cocaine use disorder, moderate, dependence ([place]) [F14.20] 06/28/2016  . Unspecified Depressive disorder [F32.9] 06/28/2016  . Tobacco use disorder [F17.200] 06/28/2016    Total Time spent with patient: 1 hour  Subjective:   Alex [name] is a 41 y.o. male patient admitted with "I just got a lot on my mind".  HPI:  Patient interviewed chart reviewed. 41 year old man well known to the emergency room brought in intoxicated with statements of suicidal thought. On evaluation today the patient had sobered up. He said he got depressed yesterday worrying about all the problems in his life. He was also fighting and arguing with some friends. He reported suicidal ideation but did not act on it. Patient claimed to me that he is drinking "hardly any" but his blood alcohol level as usual is over 200. Admits to still using cocaine as well although he minimizes that. Has not been compliant with his medicine nor has he been going to [facility] for outpatient treatment.  Social history: [place] mother and brother. Doesn't work very regularly. Minimal support.  Medical history: No significant medical problems outside of his substance abuse  Substance abuse history:  Long-standing abuse of alcohol and cocaine as well as marijuana which drives a lot of his problems.  Past Psychiatric History: Multiple visits to the emergency room under very similar circumstances usually intoxicated threatening suicide. Patient has a history of some self injury in the past. No clear chronic psychosis. Has been prescribed medication multiple times and is minimally compliant.  Risk to Self: Suicidal Ideation: Yes-Currently Present Suicidal Intent: No Is patient at risk for suicide?: No Suicidal Plan?: No Access to Means: Yes Specify Access to Suicidal Means: Pt can get to bridge to jump What has been your use of drugs/alcohol within the last 12 months?: alcohol and cocaine How many times?: 5 Other Self Harm Risks: none Triggers for Past Attempts: Family contact Intentional Self Injurious Behavior: None Risk to Others: Homicidal Ideation: No Thoughts of Harm to Others: No Current Homicidal Intent: No Current Homicidal Plan: No Access to Homicidal Means: No Identified Victim: None identified History of harm to others?: No Assessment of Violence: None Noted Does patient have access to weapons?: No Criminal Charges Pending?: No Does patient have a court date: No Prior Inpatient Therapy: Prior Inpatient Therapy: Yes Prior Therapy Dates: 11/2016, 06/2016, 05/2016 & 07/2011 Prior Therapy Facilty/Provider(s): [facility], [facility] Reason for Treatment: SI and SA Prior Outpatient Therapy: Prior Outpatient Therapy: No Prior Therapy Dates: Reports of none Prior Therapy Facilty/Provider(s): Reports of none Reason for Treatment: Reports of none Does patient have an ACCT team?: No Does patient have Intensive In-House Services?  : No Does patient have [facility] services? : No Does patient have P4CC services?: No  Past Medical History:  Past Medical History:  Diagnosis Date  . Alcohol  abuse   . Anxiety   . Cocaine abuse 05/27/2016  . Depression     Past Surgical History:   Procedure Laterality Date  . FINGER SURGERY     Family History: No family history on file. Family Psychiatric  History: Positive for alcohol abuse Social History:  History  Alcohol Use  . 25.2 oz/week  . 51 Cans of beer per week    Comment: pt admits to drinking 6+ beers a day     History  Drug Use  . Types: Marijuana, Cocaine    Comment: cocaine today    Social History   Social History  . Marital status: Single    Spouse name: N/A  . Number of children: N/A  . Years of education: N/A   Social History Main Topics  . Smoking status: Current Every Day Smoker    Packs/day: 1.00    Types: Cigarettes  . Smokeless tobacco: Never Used  . Alcohol use 25.2 oz/week    42 Cans of beer per week     Comment: pt admits to drinking 6+ beers a day  . Drug use: Yes    Types: Marijuana, Cocaine     Comment: cocaine today  . Sexual activity: Not Asked   Other Topics Concern  . None   Social History Narrative  . None   Additional Social History:    Allergies:   Allergies  Allergen Reactions  . Peanut Butter Flavor     Labs:  Results for orders placed or performed during the hospital encounter of 06/07/17 (from the past 48 hour(s))  Comprehensive metabolic panel     Status: None   Collection Time: 06/07/17  2:08 AM  Result Value Ref Range   Sodium 139 135 - 145 mmol/L   Potassium 4.0 3.5 - 5.1 mmol/L   Chloride 105 101 - 111 mmol/L   CO2 23 22 - 32 mmol/L   Glucose, Bld 98 65 - 99 mg/dL   BUN 9 6 - 20 mg/dL   Creatinine, Ser 0.93 0.61 - 1.24 mg/dL   Calcium 8.9 8.9 - 10.3 mg/dL   Total Protein 7.6 6.5 - 8.1 g/dL   Albumin 4.2 3.5 - 5.0 g/dL   AST 33 15 - 41 U/L   ALT 24 17 - 63 U/L   Alkaline Phosphatase 81 38 - 126 U/L   Total Bilirubin 0.4 0.3 - 1.2 mg/dL   GFR calc non Af Amer >60 >60 mL/min   GFR calc Af Amer >60 >60 mL/min    Comment: (NOTE) The eGFR has been calculated using the CKD EPI equation. This calculation has not been validated in all clinical  situations. eGFR's persistently <60 mL/min signify possible Chronic Kidney Disease.    Anion gap 11 5 - 15  Ethanol     Status: Abnormal   Collection Time: 06/07/17  2:08 AM  Result Value Ref Range   Alcohol, Ethyl (B) 248 (H) <5 mg/dL    Comment:        LOWEST DETECTABLE LIMIT FOR SERUM ALCOHOL IS 5 mg/dL FOR MEDICAL PURPOSES ONLY   Salicylate level     Status: None   Collection Time: 06/07/17  2:08 AM  Result Value Ref Range   Salicylate Lvl <9.4 2.8 - 30.0 mg/dL  Acetaminophen level     Status: Abnormal   Collection Time: 06/07/17  2:08 AM  Result Value Ref Range   Acetaminophen (Tylenol), Serum <10 (L) 10 - 30 ug/mL    Comment:  THERAPEUTIC CONCENTRATIONS VARY SIGNIFICANTLY. A RANGE OF 10-30 ug/mL MAY BE AN EFFECTIVE CONCENTRATION FOR MANY PATIENTS. HOWEVER, SOME ARE BEST TREATED AT CONCENTRATIONS OUTSIDE THIS RANGE. ACETAMINOPHEN CONCENTRATIONS >150 ug/mL AT 4 HOURS AFTER INGESTION AND >50 ug/mL AT 12 HOURS AFTER INGESTION ARE OFTEN ASSOCIATED WITH TOXIC REACTIONS.   cbc     Status: Abnormal   Collection Time: 06/07/17  2:08 AM  Result Value Ref Range   WBC 7.4 3.8 - 10.6 K/uL   RBC 4.50 4.40 - 5.90 MIL/uL   Hemoglobin 15.7 13.0 - 18.0 g/dL   HCT 44.5 40.0 - 52.0 %   MCV 98.9 80.0 - 100.0 fL   MCH 35.0 (H) 26.0 - 34.0 pg   MCHC 35.4 32.0 - 36.0 g/dL   RDW 14.4 11.5 - 14.5 %   Platelets 172 150 - 440 K/uL  Urine Drug Screen, Qualitative     Status: Abnormal   Collection Time: 06/07/17  2:08 AM  Result Value Ref Range   Tricyclic, Ur Screen NONE DETECTED NONE DETECTED   Amphetamines, Ur Screen NONE DETECTED NONE DETECTED   MDMA (Ecstasy)Ur Screen NONE DETECTED NONE DETECTED   Cocaine Metabolite,Ur [place] POSITIVE (A) NONE DETECTED   Opiate, Ur Screen NONE DETECTED NONE DETECTED   Phencyclidine (PCP) Ur S NONE DETECTED NONE DETECTED   Cannabinoid 50 Ng, Ur [place] POSITIVE (A) NONE DETECTED   Barbiturates, Ur Screen NONE DETECTED NONE DETECTED    Benzodiazepine, Ur Scrn NONE DETECTED NONE DETECTED   Methadone Scn, Ur NONE DETECTED NONE DETECTED    Comment: (NOTE) 712  Tricyclics, urine               Cutoff 1000 ng/mL 200  Amphetamines, urine             Cutoff 1000 ng/mL 300  MDMA (Ecstasy), urine           Cutoff 500 ng/mL 400  Cocaine Metabolite, urine       Cutoff 300 ng/mL 500  Opiate, urine                   Cutoff 300 ng/mL 600  Phencyclidine (PCP), urine      Cutoff 25 ng/mL 700  Cannabinoid, urine              Cutoff 50 ng/mL 800  Barbiturates, urine             Cutoff 200 ng/mL 900  Benzodiazepine, urine           Cutoff 200 ng/mL 1000 Methadone, urine                Cutoff 300 ng/mL 1100 1200 The urine drug screen provides only a preliminary, unconfirmed 1300 analytical test result and should not be used for non-medical 1400 purposes. Clinical consideration and professional judgment should 1500 be applied to any positive drug screen result due to possible 1600 interfering substances. A more specific alternate chemical method 1700 must be used in order to obtain a confirmed analytical result.  1800 Gas chromato graphy / mass spectrometry (GC/MS) is the preferred 1900 confirmatory method.   Troponin I     Status: None   Collection Time: 06/07/17  2:08 AM  Result Value Ref Range   Troponin I <0.03 <0.03 ng/mL    No current facility-administered medications for this encounter.    Current Outpatient Prescriptions  Medication Sig Dispense Refill  . citalopram (CELEXA) 20 MG tablet Take 2 tablets (40 mg total) by mouth daily.  30 tablet 1  . Multiple Vitamin (MULTIVITAMIN) tablet Take 1 tablet by mouth daily.    Marland Kitchen omeprazole (PRILOSEC) 20 MG capsule Take 20 mg by mouth 2 (two) times daily before a meal.    . ondansetron (ZOFRAN) 4 MG tablet Take 4 mg by mouth every 8 (eight) hours as needed for nausea or vomiting.    . traZODone (DESYREL) 150 MG tablet Take 1 tablet (150 mg total) by mouth at bedtime. 30 tablet 1  .  valproic acid (DEPAKENE) 250 MG capsule Take 3 capsules (750 mg total) by mouth at bedtime. 90 capsule 1    Musculoskeletal: Strength & Muscle Tone: within normal limits Gait & Station: normal Patient leans: N/A  Psychiatric Specialty Exam: Physical Exam  Nursing note and vitals reviewed. Constitutional: He appears well-developed and well-nourished.  HENT:  Head: Normocephalic and atraumatic.  Eyes: Pupils are equal, round, and reactive to light. Conjunctivae are normal.  Neck: Normal range of motion.  Cardiovascular: Regular rhythm and normal heart sounds.   Respiratory: He is in respiratory distress.  GI: Soft.  Musculoskeletal: Normal range of motion.  Neurological: He is alert.  Skin: Skin is warm and dry.  Psychiatric: He has a normal mood and affect. His behavior is normal. Judgment and thought content normal.    Review of Systems  Constitutional: Negative.   HENT: Negative.   Eyes: Negative.   Respiratory: Negative.   Cardiovascular: Negative.   Gastrointestinal: Negative.   Musculoskeletal: Negative.   Skin: Negative.   Neurological: Negative.   Psychiatric/Behavioral: Positive for depression, memory loss and substance abuse. Negative for hallucinations and suicidal ideas. The patient is nervous/anxious. The patient does not have insomnia.     Blood pressure 130/76, pulse 77, temperature 98.6 F (37 C), temperature source Oral, resp. rate 18, height 5' 6"  (1.676 m), weight 86.2 kg (190 lb), SpO2 97 %.Body mass index is 30.67 kg/m.  General Appearance: Disheveled  Eye Contact:  Fair  Speech:  Slow  Volume:  Decreased  Mood:  Dysphoric  Affect:  Congruent  Thought Process:  Goal Directed  Orientation:  Full (Time, Place, and Person)  Thought Content:  Rumination and Tangential  Suicidal Thoughts:  No  Homicidal Thoughts:  No  Memory:  Immediate;   Good Recent;   Fair Remote;   Fair  Judgement:  Fair  Insight:  Fair  Psychomotor Activity:  Normal   Concentration:  Concentration: Fair  Recall:  [place] of Knowledge:  Poor  Language:  Poor  Akathisia:  No  Handed:  Right  AIMS (if indicated):     Assets:  Desire for Improvement Physical Health  ADL's:  Intact  Cognition:  Impaired,  Mild  Sleep:        Treatment Plan Summary: Plan 41 year old man with a history of chronic alcohol and drug abuse. Sobered up this morning. Not endorsing suicidal or homicidal intent. Patient does not require inpatient treatment. Repeated counseling to the patient about engaging in appropriate substance abuse treatment. No need for new prescriptions these of been done multiple times in the past. Case reviewed with emergency room physician and TTS. Discontinue IVC. Follow-up with [facility].  Disposition: Patient does not meet criteria for psychiatric inpatient admission. Supportive therapy provided about ongoing stressors.  Alethia Berthold, MD 06/07/2017 7:36 PM

## 2017-06-07 NOTE — ED Notes (Signed)
Nurse spoke to patient and He said, I don't feel well, I want to sleep, nurse ask him what was wrong and He said " I need my meds' . Nurse informed him that medications would have to be ordered per Doctor, patient went back to sleep/. No signs of distress.

## 2017-06-07 NOTE — ED Triage Notes (Signed)
Pt brought in by [place] police   Pt is IVC.  Pt reports SI or HI.  Pt admits to etoh use.  Pt denies drug use.   Pt cooperative.

## 2017-06-07 NOTE — ED Notes (Addendum)
Patient ate lunch, 100% and beverage, patient also marched to the nursing station, and said " I need my meds" nurse let him know that Dr. Toni Amendlapacs would come to talk to him today and if He would look at His home medications. Patient went back to His room. q 15 minute checks and camera surveillance in progress for safety.

## 2017-06-07 NOTE — ED Notes (Signed)
Snack and beverage given. 

## 2017-06-07 NOTE — ED Notes (Signed)
Pt. Transferred to BHU from ED to room 4 after screening for contraband. Report to include Situation, Background, Assessment and Recommendations from RN. Pt. Oriented to unit including Q15 minute rounds as well as the security cameras for their protection. Patient is alert and oriented, warm and dry in no acute distress. Patient states he has SI, HI, and AVH but will not elaborate. Pt. Encouraged to let me know if needs arise.

## 2017-06-07 NOTE — ED Notes (Signed)
Pt attempting to through trash in floor. This RN asked pt to get up and through trash in trash can. Pt pointing finger in this RN's face and sts 'Don't start with me."

## 2017-06-07 NOTE — ED Provider Notes (Signed)
[facility] Emergency Department Provider Note   ____________________________________________   First MD Initiated Contact with Patient 06/07/17 [phone]     (approximate)  I have reviewed the triage vital signs and the nursing notes.   HISTORY  Chief Complaint Behavior Problem    HPI Alex [name] is a 41 y.o. male who comes into the hospital today stating that he wants to hurt other people and he wants to be alone. The patient reports that people don't respect each other. He reports that he is not feeling this way because of alcohol or drugs. He states that he's trying to get some help which is why he is here. He will wants to be put somewhere where he can get some good health and rehabilitation. He states that he went to a rehabilitation facility long time ago and it was very expensive. He reports that they also don't keep him very long. He reports that he is hungry and he hasn't had new medications. The patient was seen recently after threatening to jump off of a bridge. According to the involuntary commitment paperwork the patient stated that he would run into traffic. He admits to using cocaine and drinking.   Past Medical History:  Diagnosis Date  . Alcohol abuse   . Anxiety   . Cocaine abuse 05/27/2016  . Depression     Patient Active Problem List   Diagnosis Date Noted  . Substance induced mood disorder ([facility]) 12/30/2016  . Moderate recurrent major depression ([facility]) 12/30/2016  . Cocaine abuse   . ADHD 08/11/2016  . Alcohol dependence with unspecified alcohol-induced disorder ([facility]) 06/28/2016  . Alcohol withdrawal ([facility]) 06/28/2016  . Cocaine use disorder, moderate, dependence ([facility]) 06/28/2016  . Unspecified Depressive disorder 06/28/2016  . Tobacco use disorder 06/28/2016    Past Surgical History:  Procedure Laterality Date  . FINGER SURGERY      Prior to Admission medications   Medication Sig Start Date End Date Taking? Authorizing  Provider  citalopram (CELEXA) 20 MG tablet Take 2 tablets (40 mg total) by mouth daily. 12/30/16   Clapacs, Jackquline DenmarkJohn T, MD  Multiple Vitamin (MULTIVITAMIN) tablet Take 1 tablet by mouth daily.    [provider]  omeprazole (PRILOSEC) 20 MG capsule Take 20 mg by mouth 2 (two) times daily before a meal.    [provider]  ondansetron (ZOFRAN) 4 MG tablet Take 4 mg by mouth every 8 (eight) hours as needed for nausea or vomiting.    [provider]  traZODone (DESYREL) 150 MG tablet Take 1 tablet (150 mg total) by mouth at bedtime. 12/30/16   Clapacs, Jackquline DenmarkJohn T, MD  valproic acid (DEPAKENE) 250 MG capsule Take 3 capsules (750 mg total) by mouth at bedtime. 05/29/17   [name], Cory, MD    Allergies Peanut butter flavor  No family history on file.  Social History Social History  Substance Use Topics  . Smoking status: Current Every Day Smoker    Packs/day: 1.00    Types: Cigarettes  . Smokeless tobacco: Never Used  . Alcohol use 25.2 oz/week    42 Cans of beer per week     Comment: pt admits to drinking 6+ beers a day    Review of Systems  Constitutional: No fever/chills Eyes: No visual changes. ENT: No sore throat. Cardiovascular: Denies chest pain. Respiratory: Denies shortness of breath. Gastrointestinal: No abdominal pain.  No nausea, no vomiting.  No diarrhea.  No constipation. Genitourinary: Negative for dysuria. Musculoskeletal: Negative  for back pain. Skin: Negative for rash. Neurological: Negative for headaches, focal weakness or numbness.   ____________________________________________   PHYSICAL EXAM:  VITAL SIGNS: ED Triage Vitals  Enc Vitals Group     BP 06/07/17 0208 111/76     Pulse Rate 06/07/17 0208 84     Resp 06/07/17 0208 20     Temp 06/07/17 0208 97.9 F (36.6 C)     Temp Source 06/07/17 0208 Oral     SpO2 06/07/17 0208 95 %     Weight 06/07/17 0210 190 lb (86.2 kg)     Height 06/07/17 0210  (1.676 m)     Head Circumference  --      Peak Flow --      Pain Score --      Pain Loc --      Pain Edu? --      Excl. in GC? --     Constitutional: Alert and oriented. Well appearing and in mild distress. Eyes: Conjunctivae are normal. PERRL. EOMI. Head: Atraumatic. Nose: No congestion/rhinnorhea. Mouth/Throat: Mucous membranes are moist.  Oropharynx non-erythematous. Cardiovascular: Normal rate, regular rhythm. Grossly normal heart sounds.  Good peripheral circulation. Respiratory: Normal respiratory effort.  No retractions. Lungs CTAB. Gastrointestinal: Soft and nontender. No distention. positive bowel sounds Musculoskeletal: No lower extremity tenderness nor edema.   Neurologic:  Normal speech and language.  Skin:  Skin is warm, dry and intact.  Psychiatric: Mood and affect are normal.   ____________________________________________   LABS (all labs ordered are listed, but only abnormal results are displayed)  Labs Reviewed  ETHANOL - Abnormal; Notable for the following:       Result Value   Alcohol, Ethyl (B) 248 (*)    All other components within normal limits  ACETAMINOPHEN LEVEL - Abnormal; Notable for the following:    Acetaminophen (Tylenol), Serum <10 (*)    All other components within normal limits  CBC - Abnormal; Notable for the following:    MCH 35.0 (*)    All other components within normal limits  URINE DRUG SCREEN, QUALITATIVE ([facility] ONLY) - Abnormal; Notable for the following:    Cocaine Metabolite,Ur [place] POSITIVE (*)    Cannabinoid 50 Ng, Ur [place] POSITIVE (*)    All other components within normal limits  COMPREHENSIVE METABOLIC PANEL  SALICYLATE LEVEL  TROPONIN I   ____________________________________________  EKG  ED ECG REPORT I, Rebecka Apley, the attending physician, personally viewed and interpreted this ECG.   Date: 06/07/2017  EKG Time: 255  Rate: 76  Rhythm: normal sinus rhythm  Axis: normal  Intervals:none  ST&T Change:  none  ____________________________________________  RADIOLOGY  Dg Chest 2 View  Result Date: 06/07/2017 CLINICAL DATA:  Chest pain for 2 weeks.  Smoker. EXAM: CHEST  2 VIEW COMPARISON:  07/02/2016 FINDINGS: Slightly shallow inspiration. There is suggestion of focal infiltration in the right lung base suggesting a early pneumonia. Left lung is clear. No blunting of costophrenic angles. No pneumothorax. Mediastinal contours appear intact. Heart size and pulmonary vascularity are normal. IMPRESSION: Suggestion of focal infiltration in the right lung base may indicate pneumonia. Electronically Signed   By: Burman Nieves M.D.   On: 06/07/2017 03:32    ____________________________________________   PROCEDURES  Procedure(s) performed: None  Procedures  Critical Care performed: No  ____________________________________________   INITIAL IMPRESSION / ASSESSMENT AND PLAN / ED COURSE  Pertinent labs & imaging results that were available during my care of the patient were reviewed by  me and considered in my medical decision making (see chart for details).  this is a 41 year old who comes into the hospital today with suicidal and homicidal ideation. the patient's alcohol is 248 and he does have some cocaine and cannabinoids in his urine. We will have the patient evaluated by psych and TTS. The patient has no complaints at this time.      ____________________________________________   FINAL CLINICAL IMPRESSION(S) / ED DIAGNOSES  Final diagnoses:  Suicidal ideation  Homicidal ideation      NEW MEDICATIONS STARTED DURING THIS VISIT:  New Prescriptions   No medications on file     Note:  This document was prepared using Dragon voice recognition software and may include unintentional dictation errors.    Rebecka Apley, MD 06/07/17 [phone]

## 2017-06-07 NOTE — ED Notes (Signed)
Hourly rounding reveals patient sleeping in room. No complaints, stable, in no acute distress. Q15 minute rounds and monitoring via Security Cameras to continue. 

## 2017-06-07 NOTE — ED Notes (Signed)
Patient is alert and oriented, voices understanding of discharge instructions, patient's belongings given back to Patient, Patient without signs of distress.

## 2017-06-07 NOTE — ED Notes (Signed)
Patient is pacing, wanting to leave, but worried about having a ride home, nurse talked with social worker regarding a voucher, awaiting a answer. Patient is oriented, no behavioral issues, q 15 minute checks in process.

## 2017-06-18 ENCOUNTER — Other Ambulatory Visit: Payer: Self-pay | Admitting: Psychiatry

## 2017-06-23 ENCOUNTER — Encounter: Payer: Self-pay | Admitting: Emergency Medicine

## 2017-06-23 ENCOUNTER — Emergency Department
Admission: EM | Admit: 2017-06-23 | Discharge: 2017-06-24 | Disposition: A | Discharge status: Home / Self Care | Payer: Medicaid Other | Attending: Emergency Medicine | Admitting: Emergency Medicine

## 2017-06-23 DIAGNOSIS — F10920 Alcohol use, unspecified with intoxication, uncomplicated: Secondary | ICD-10-CM | POA: Insufficient documentation

## 2017-06-23 DIAGNOSIS — R569 Unspecified convulsions: Secondary | ICD-10-CM | POA: Diagnosis present

## 2017-06-23 DIAGNOSIS — Z79899 Other long term (current) drug therapy: Secondary | ICD-10-CM | POA: Insufficient documentation

## 2017-06-23 DIAGNOSIS — F329 Major depressive disorder, single episode, unspecified: Secondary | ICD-10-CM | POA: Diagnosis not present

## 2017-06-23 DIAGNOSIS — F1410 Cocaine abuse, uncomplicated: Secondary | ICD-10-CM | POA: Diagnosis not present

## 2017-06-23 DIAGNOSIS — F17210 Nicotine dependence, cigarettes, uncomplicated: Secondary | ICD-10-CM | POA: Diagnosis not present

## 2017-06-23 DIAGNOSIS — Z91010 Allergy to peanuts: Secondary | ICD-10-CM | POA: Diagnosis not present

## 2017-06-23 DIAGNOSIS — F1210 Cannabis abuse, uncomplicated: Secondary | ICD-10-CM | POA: Insufficient documentation

## 2017-06-23 DIAGNOSIS — M79644 Pain in right finger(s): Secondary | ICD-10-CM | POA: Insufficient documentation

## 2017-06-23 NOTE — ED Triage Notes (Addendum)
Pt arrived via ems from home. EMS reports pt's mom called ems for seizure like activity. Upon ems initial arrival to house, pt was alert and oriented. Upon arrival to ED pt remains alert and oriented x 4. Pt states he has a history of seizures but it unsure when his last seizure was and what medication he takes for them. Pt states he remember becoming 'aggressive' after seizure and states that is how he 'normally' acts after a seizure.

## 2017-06-24 ENCOUNTER — Emergency Department: Payer: Medicaid Other

## 2017-06-24 LAB — CBC
HEMATOCRIT: 45.6 % (ref 40.0–52.0)
Hemoglobin: 16 g/dL (ref 13.0–18.0)
MCH: 34.8 pg — ABNORMAL HIGH (ref 26.0–34.0)
MCHC: 35.2 g/dL (ref 32.0–36.0)
MCV: 98.8 fL (ref 80.0–100.0)
PLATELETS: 167 10*3/uL (ref 150–440)
RBC: 4.61 MIL/uL (ref 4.40–5.90)
RDW: 14 % (ref 11.5–14.5)
WBC: 7.3 10*3/uL (ref 3.8–10.6)

## 2017-06-24 LAB — URINE DRUG SCREEN, QUALITATIVE (ARMC ONLY)
AMPHETAMINES, UR SCREEN: NOT DETECTED
BENZODIAZEPINE, UR SCRN: NOT DETECTED
Barbiturates, Ur Screen: NOT DETECTED
Cannabinoid 50 Ng, Ur ~~LOC~~: POSITIVE — AB
Cocaine Metabolite,Ur ~~LOC~~: POSITIVE — AB
MDMA (Ecstasy)Ur Screen: NOT DETECTED
METHADONE SCREEN, URINE: NOT DETECTED
OPIATE, UR SCREEN: NOT DETECTED
Phencyclidine (PCP) Ur S: NOT DETECTED
TRICYCLIC, UR SCREEN: NOT DETECTED

## 2017-06-24 LAB — VALPROIC ACID LEVEL: VALPROIC ACID LVL: 47 ug/mL — AB (ref 50.0–100.0)

## 2017-06-24 LAB — LIPASE, BLOOD: Lipase: 53 U/L — ABNORMAL HIGH (ref 11–51)

## 2017-06-24 LAB — BASIC METABOLIC PANEL
Anion gap: 9 (ref 5–15)
BUN: 10 mg/dL (ref 6–20)
CHLORIDE: 104 mmol/L (ref 101–111)
CO2: 26 mmol/L (ref 22–32)
CREATININE: 0.82 mg/dL (ref 0.61–1.24)
Calcium: 8.9 mg/dL (ref 8.9–10.3)
GFR calc non Af Amer: >60 mL/min (ref >60)
Glucose, Bld: 99 mg/dL (ref 65–99)
POTASSIUM: 4.1 mmol/L (ref 3.5–5.1)
SODIUM: 139 mmol/L (ref 135–145)

## 2017-06-24 LAB — HEPATIC FUNCTION PANEL
ALBUMIN: 3.9 g/dL (ref 3.5–5.0)
ALT: 17 U/L (ref 17–63)
AST: 26 U/L (ref 15–41)
Alkaline Phosphatase: 77 U/L (ref 38–126)
BILIRUBIN TOTAL: 0.5 mg/dL (ref 0.3–1.2)
Bilirubin, Direct: <0.1 mg/dL — ABNORMAL LOW (ref 0.1–0.5)
Total Protein: 7.1 g/dL (ref 6.5–8.1)

## 2017-06-24 LAB — SALICYLATE LEVEL: Salicylate Lvl: <7 mg/dL (ref 2.8–30.0)

## 2017-06-24 LAB — ETHANOL: Alcohol, Ethyl (B): 147 mg/dL — ABNORMAL HIGH (ref <10)

## 2017-06-24 LAB — ACETAMINOPHEN LEVEL

## 2017-06-24 MED ORDER — LORAZEPAM 2 MG/ML IJ SOLN
1.0000 mg | Freq: Once | INTRAMUSCULAR | Status: AC
  Administered 2017-06-24: 1 mg via INTRAVENOUS
  Filled 2017-06-24: qty 1

## 2017-06-24 MED ORDER — SODIUM CHLORIDE 0.9 % IV BOLUS (SEPSIS)
1000.0000 mL | Freq: Once | INTRAVENOUS | Status: AC
  Administered 2017-06-24: 1000 mL via INTRAVENOUS

## 2017-06-24 NOTE — ED Notes (Signed)
Patient transported to CT 

## 2017-06-24 NOTE — ED Notes (Signed)
AAOx3.  Skin warm and dry.  MAE equally and strong.  Ambulates with easy and steady gait.  Discharged.  Bus voucher given.

## 2017-06-24 NOTE — ED Provider Notes (Signed)
10:45am patient is awake and clinically sober.  We discussed if he has continued concern about possible seizure, to follow up with a primary care doctor and possibly a neurologist for potentially additional testing including eeg.  For now, symptoms felt to be due/induced by polysubstance abuse.   Governor Rooks, MD 06/24/17 1059

## 2017-06-24 NOTE — ED Notes (Signed)
Pt sleeping. VSS. NAD. Equal rise and fall of the chest. No seizures seen. Call light within reach. Will continue to monitor.

## 2017-06-24 NOTE — Discharge Instructions (Signed)
Stop using alcohol and illegal drugs. Take your medicines as directed by your doctor. Return to the ER for worsening symptoms, persistent vomiting, difficulty breathing or other concerns.

## 2017-06-24 NOTE — ED Provider Notes (Signed)
[facility] Emergency Department Provider Note   ____________________________________________   First MD Initiated Contact with Patient 06/24/17 0003     (approximate)  I have reviewed the triage vital signs and the nursing notes.   HISTORY  Chief Complaint Seizures    HPI Alex [name] is a 41 y.o. male brought to the ED via EMS from home with a chief complaint of seizure-like activity. Patient is well-known to the emergency department with a history of depression, alcohol dependency, cocaine use disorder who is supposed to take valproic acid. EMS reports patient's mother called for "seizure-like activity". Patient recalls all events and states he felt shaky with global headache but was awake the entire time.Patient states he is compliant with his medications. Denies SI/HI/AH/VH. Denies recent fever, chills, chest pain, shortness of breath, abdominal pain, nausea, vomiting. Denies recent travel or trauma.   Past Medical History:  Diagnosis Date  . Alcohol abuse   . Anxiety   . Cocaine abuse 05/27/2016  . Depression     Patient Active Problem List   Diagnosis Date Noted  . Substance induced mood disorder (HCC) 12/30/2016  . Moderate recurrent major depression (HCC) 12/30/2016  . Cocaine abuse   . ADHD 08/11/2016  . Alcohol dependence with unspecified alcohol-induced disorder (HCC) 06/28/2016  . Alcohol withdrawal (HCC) 06/28/2016  . Cocaine use disorder, moderate, dependence (HCC) 06/28/2016  . Unspecified Depressive disorder 06/28/2016  . Tobacco use disorder 06/28/2016    Past Surgical History:  Procedure Laterality Date  . FINGER SURGERY      Prior to Admission medications   Medication Sig Start Date End Date Taking? Authorizing Provider  citalopram (CELEXA) 20 MG tablet TAKE TWO TABLETS BY MOUTH EVERY DAY 06/20/17   Clapacs, Jackquline Denmark, MD  Multiple Vitamin (MULTIVITAMIN) tablet Take 1 tablet by mouth daily.    [provider]    omeprazole (PRILOSEC) 20 MG capsule Take 20 mg by mouth 2 (two) times daily before a meal.    [provider]  ondansetron (ZOFRAN) 4 MG tablet Take 4 mg by mouth every 8 (eight) hours as needed for nausea or vomiting.    [provider]  traZODone (DESYREL) 150 MG tablet Take 1 tablet (150 mg total) by mouth at bedtime. 12/30/16   Clapacs, Jackquline Denmark, MD  valproic acid (DEPAKENE) 250 MG capsule TAKE THREE CAPSULES BY MOUTH AT BEDTIME 06/20/17   Clapacs, Jackquline Denmark, MD    Allergies Peanut butter flavor  No family history on file.  Social History Social History  Substance Use Topics  . Smoking status: Current Every Day Smoker    Packs/day: 1.00    Types: Cigarettes  . Smokeless tobacco: Never Used  . Alcohol use 25.2 oz/week    42 Cans of beer per week     Comment: pt admits to drinking 6+ beers a day    Review of Systems  Constitutional: No fever/chills. Eyes: No visual changes. ENT: No sore throat. Cardiovascular: Denies chest pain. Respiratory: Denies shortness of breath. Gastrointestinal: No abdominal pain.  No nausea, no vomiting.  No diarrhea.  No constipation. Genitourinary: Negative for dysuria. Musculoskeletal: Negative for back pain. Skin: Negative for rash. Neurological: positive for headache. negative for focal weakness or numbness. Psychiatric:negative for depression.   ____________________________________________   PHYSICAL EXAM:  VITAL SIGNS: ED Triage Vitals [06/23/17 2351]  Enc Vitals Group     BP      Pulse      Resp  Temp      Temp src      SpO2      Weight 190 lb (86.2 kg)     Height  (1.626 m)     Head Circumference      Peak Flow      Pain Score      Pain Loc      Pain Edu?      Excl. in GC?     Constitutional: Alert and oriented. Well appearing and in no acute distress. Eyes: Conjunctivae are normal. PERRL. EOMI. Head: Atraumatic. Nose: No congestion/rhinnorhea. Mouth/Throat: Mucous membranes are moist.   Oropharynx non-erythematous. Neck: No stridor.  No cervical spine tenderness to palpation.  No carotid bruits. Cardiovascular: Normal rate, regular rhythm. Grossly normal heart sounds.  Good peripheral circulation. Respiratory: Normal respiratory effort.  No retractions. Lungs CTAB. Gastrointestinal: Soft and nontender. No distention. No abdominal bruits. No CVA tenderness. Musculoskeletal: No lower extremity tenderness nor edema.  No joint effusions.  Right middle finger nail bloodied from crush injury several days ago. Full range of motion without pain. No deformity noted. Neurologic:  Normal speech and language. No gross focal neurologic deficits are appreciated.  Skin:  Skin is warm, dry and intact. No rash noted. Psychiatric: Mood and affect are normal. Speech and behavior are normal.  ____________________________________________   LABS (all labs ordered are listed, but only abnormal results are displayed)  Labs Reviewed  CBC - Abnormal; Notable for the following:       Result Value   MCH 34.8 (*)    All other components within normal limits  ETHANOL - Abnormal; Notable for the following:    Alcohol, Ethyl (B) 147 (*)    All other components within normal limits  HEPATIC FUNCTION PANEL - Abnormal; Notable for the following:    Bilirubin, Direct <0.1 (*)    All other components within normal limits  LIPASE, BLOOD - Abnormal; Notable for the following:    Lipase 53 (*)    All other components within normal limits  ACETAMINOPHEN LEVEL - Abnormal; Notable for the following:    Acetaminophen (Tylenol), Serum <10 (*)    All other components within normal limits  URINE DRUG SCREEN, QUALITATIVE ([facility] ONLY) - Abnormal; Notable for the following:    Cocaine Metabolite,Ur [place] POSITIVE (*)    Cannabinoid 50 Ng, Ur [place] POSITIVE (*)    All other components within normal limits  VALPROIC ACID LEVEL - Abnormal; Notable for the following:    Valproic Acid Lvl 47 (*)    All other components  within normal limits  BASIC METABOLIC PANEL  SALICYLATE LEVEL  CBG MONITORING, ED   ____________________________________________  EKG  ED ECG REPORT I, SUNG,JADE J, the attending physician, personally viewed and interpreted this ECG.   Date: 06/24/2017  EKG Time: 2354  Rate: 89  Rhythm: normal EKG, normal sinus rhythm  Axis: LAD  Intervals:none  ST&T Change: nonspecific  ____________________________________________  RADIOLOGY  Ct Head Wo Contrast  Result Date: 06/24/2017 CLINICAL DATA:  Seizures EXAM: CT HEAD WITHOUT CONTRAST TECHNIQUE: Contiguous axial images were obtained from the base of the skull through the vertex without intravenous contrast. COMPARISON:  05/29/2017 FINDINGS: Brain: No acute intracranial hemorrhage, midline shift or edema. No intra-axial mass nor extra-axial fluid collections. No hydrocephalus. No large vascular territory infarct. Vascular: No hyperdense vessels or unexpected calcifications. Skull: No fracture or focal lesions. Sinuses/Orbits: No acute finding. Other: None. IMPRESSION: No acute intracranial abnormality. Electronically Signed   By: Onalee Hua  Sterling Big M.D.   On: 06/24/2017 01:10   Dg Finger Middle Right  Result Date: 06/24/2017 CLINICAL DATA:  41 y/o  M; crush injury to distal middle finger. EXAM: RIGHT MIDDLE FINGER 2+V COMPARISON:  None. FINDINGS: There is no evidence of fracture or dislocation. There is no evidence of arthropathy or other focal bone abnormality. Soft tissues are unremarkable. IMPRESSION: No acute fracture or dislocation identified. Electronically Signed   By: Mitzi Hansen M.D.   On: 06/24/2017 02:04    ____________________________________________   PROCEDURES  Procedure(s) performed: None  Procedures  Critical Care performed: No  ____________________________________________   INITIAL IMPRESSION / ASSESSMENT AND PLAN / ED COURSE  Pertinent labs & imaging results that were available during my care of the  patient were reviewed by me and considered in my medical decision making (see chart for details).  41 year old male with alcohol and cocaine abuse who presents with "seizure-like activity". He is awake and alert without focal neurological deficits. Requesting CT scan of his head because he does not understand why he continues to have "seizure-like activity". I had a frank discussion with him that his alcohol and cocaine use lower seizure threshold and that is why he continues to experience these episodes. Otherwise do not feel CT head is absolutely medical necessary, patient is an unreliable historian so I will scan his head to rule out intracranial hemorrhage in the event there was a trauma this morning. Will initiate IV fluid resuscitation for alcohol intoxication, Ativan to prevent withdrawals and reassess.  Clinical Course as of Jun 25 647  Fri Jun 24, 2017  0127 Patient now requesting x-ray for his right middle finger which was slammed in a door several days ago. Results of laboratory and urinalysis noted  [JS]  0640 Patient sleeping in no acute distress. Will discharge home this morning once he is sober and ambulatory.  [JS]    Clinical Course User Index [JS] Irean Hong, MD     ____________________________________________   FINAL CLINICAL IMPRESSION(S) / ED DIAGNOSES  Final diagnoses:  Alcoholic intoxication without complication (HCC)  Cocaine abuse (HCC)  Marijuana abuse      NEW MEDICATIONS STARTED DURING THIS VISIT:  New Prescriptions   No medications on file     Note:  This document was prepared using Dragon voice recognition software and may include unintentional dictation errors.    Irean Hong, MD 06/25/17 [phone]

## 2017-06-30 ENCOUNTER — Emergency Department
Admission: EM | Admit: 2017-06-30 | Discharge: 2017-06-30 | Disposition: A | Discharge status: Home / Self Care | Payer: Medicaid Other | Attending: Student in an Organized Health Care Education/Training Program | Admitting: Student in an Organized Health Care Education/Training Program

## 2017-06-30 ENCOUNTER — Encounter: Payer: Self-pay | Admitting: Emergency Medicine

## 2017-06-30 DIAGNOSIS — F17210 Nicotine dependence, cigarettes, uncomplicated: Secondary | ICD-10-CM | POA: Insufficient documentation

## 2017-06-30 DIAGNOSIS — R112 Nausea with vomiting, unspecified: Secondary | ICD-10-CM | POA: Diagnosis not present

## 2017-06-30 DIAGNOSIS — Z79899 Other long term (current) drug therapy: Secondary | ICD-10-CM | POA: Diagnosis not present

## 2017-06-30 DIAGNOSIS — R1013 Epigastric pain: Secondary | ICD-10-CM | POA: Diagnosis not present

## 2017-06-30 DIAGNOSIS — Z91010 Allergy to peanuts: Secondary | ICD-10-CM | POA: Diagnosis not present

## 2017-06-30 DIAGNOSIS — F1010 Alcohol abuse, uncomplicated: Secondary | ICD-10-CM | POA: Diagnosis not present

## 2017-06-30 HISTORY — DX: Unspecified convulsions: R56.9

## 2017-06-30 LAB — CBC
HCT: 43.7 % (ref 40.0–52.0)
Hemoglobin: 15.3 g/dL (ref 13.0–18.0)
MCH: 33.8 pg (ref 26.0–34.0)
MCHC: 35.1 g/dL (ref 32.0–36.0)
MCV: 96.5 fL (ref 80.0–100.0)
PLATELETS: 164 10*3/uL (ref 150–440)
RBC: 4.53 MIL/uL (ref 4.40–5.90)
RDW: 14 % (ref 11.5–14.5)
WBC: 8.5 10*3/uL (ref 3.8–10.6)

## 2017-06-30 LAB — COMPREHENSIVE METABOLIC PANEL
ALT: 29 U/L (ref 17–63)
AST: 43 U/L — AB (ref 15–41)
Albumin: 4.1 g/dL (ref 3.5–5.0)
Alkaline Phosphatase: 78 U/L (ref 38–126)
Anion gap: 11 (ref 5–15)
BUN: 14 mg/dL (ref 6–20)
CHLORIDE: 100 mmol/L — AB (ref 101–111)
CO2: 23 mmol/L (ref 22–32)
CREATININE: 0.7 mg/dL (ref 0.61–1.24)
Calcium: 8.4 mg/dL — ABNORMAL LOW (ref 8.9–10.3)
GFR calc non Af Amer: >60 mL/min (ref >60)
Glucose, Bld: 109 mg/dL — ABNORMAL HIGH (ref 65–99)
Potassium: 3.9 mmol/L (ref 3.5–5.1)
SODIUM: 134 mmol/L — AB (ref 135–145)
Total Bilirubin: 0.4 mg/dL (ref 0.3–1.2)
Total Protein: 7.6 g/dL (ref 6.5–8.1)

## 2017-06-30 LAB — LIPASE, BLOOD: LIPASE: 52 U/L — AB (ref 11–51)

## 2017-06-30 MED ORDER — PROMETHAZINE HCL 25 MG/ML IJ SOLN
25.0000 mg | Freq: Four times a day (QID) | INTRAMUSCULAR | Status: DC | PRN
Start: 2017-06-30 — End: 2017-06-30
  Administered 2017-06-30: 25 mg via INTRAVENOUS
  Filled 2017-06-30: qty 1

## 2017-06-30 NOTE — ED Notes (Signed)
Spoke with mother (legal guardian) mother verified pt was to take bus home. Went over discharge paper work with her and pt. No questions at this time

## 2017-06-30 NOTE — Discharge Instructions (Addendum)

## 2017-06-30 NOTE — ED Triage Notes (Signed)
Pt presents to ED via EMS with c/o abd cramping and frequent diarrhea for the past couple of days. +ETOH today. Decrease in appetite.

## 2017-06-30 NOTE — ED Provider Notes (Signed)
[facility] Emergency Department Provider Note    First MD Initiated Contact with Patient 06/30/17 [phone]     (approximate)  I have reviewed the triage vital signs and the nursing notes.   HISTORY  Chief Complaint Diarrhea     HPI Alex [name] is a 41 y.o. male organisms facility presents with chief complaint of epigastric discomfort at nausea and vomiting after ingesting alcohol. Patient has a history of alcohol abuse and polysubstance abuse. Admits to drinking "2 cans of beer "today." That was all that I could keep down."  denies any fevers. No chest pain. No right lower quadrant pain. No history of cirrhosis. No melena or hematochezia.   Past Medical History:  Diagnosis Date  . Alcohol abuse   . Anxiety   . Cocaine abuse (HCC) 05/27/2016  . Depression   . Seizures (HCC)    No family history on file. Past Surgical History:  Procedure Laterality Date  . FINGER SURGERY     Patient Active Problem List   Diagnosis Date Noted  . Substance induced mood disorder (HCC) 12/30/2016  . Moderate recurrent major depression (HCC) 12/30/2016  . Cocaine abuse (HCC)   . ADHD 08/11/2016  . Alcohol dependence with unspecified alcohol-induced disorder (HCC) 06/28/2016  . Alcohol withdrawal (HCC) 06/28/2016  . Cocaine use disorder, moderate, dependence (HCC) 06/28/2016  . Unspecified Depressive disorder 06/28/2016  . Tobacco use disorder 06/28/2016      Prior to Admission medications   Medication Sig Start Date End Date Taking? Authorizing Provider  citalopram (CELEXA) 20 MG tablet TAKE TWO TABLETS BY MOUTH EVERY DAY 06/20/17   Clapacs, Jackquline Denmark, MD  Multiple Vitamin (MULTIVITAMIN) tablet Take 1 tablet by mouth daily.    [provider]  omeprazole (PRILOSEC) 20 MG capsule Take 20 mg by mouth 2 (two) times daily before a meal.    [provider]  ondansetron (ZOFRAN) 4 MG tablet Take 4 mg by mouth every 8 (eight) hours as needed for nausea or  vomiting.    [provider]  traZODone (DESYREL) 150 MG tablet Take 1 tablet (150 mg total) by mouth at bedtime. 12/30/16   Clapacs, Jackquline Denmark, MD  valproic acid (DEPAKENE) 250 MG capsule TAKE THREE CAPSULES BY MOUTH AT BEDTIME 06/20/17   Clapacs, Jackquline Denmark, MD    Allergies Peanut butter flavor    Social History Social History  Substance Use Topics  . Smoking status: Current Every Day Smoker    Packs/day: 1.00    Types: Cigarettes  . Smokeless tobacco: Never Used  . Alcohol use 25.2 oz/week    42 Cans of beer per week     Comment: pt admits to drinking 6+ beers a day    Review of Systems Patient denies headaches, rhinorrhea, blurry vision, numbness, shortness of breath, chest pain, edema, cough, abdominal pain, nausea, vomiting, diarrhea, dysuria, fevers, rashes or hallucinations unless otherwise stated above in HPI. ____________________________________________   PHYSICAL EXAM:  VITAL SIGNS: Vitals:   06/30/17 0520 06/30/17 0634  BP: 130/61 129/72  Pulse: 77 80  Resp: 18 18  Temp:    SpO2: 93% 92%    Constitutional: Alert, smells of alcohol, in no acute distress. Eyes: Conjunctivae are normal.  Head: Atraumatic. Nose: No congestion/rhinnorhea. Mouth/Throat: Mucous membranes are moist.   Neck: No stridor. Painless ROM.  Cardiovascular: Normal rate, regular rhythm. Grossly normal heart sounds.  Good peripheral circulation. Respiratory: Normal respiratory effort.  No retractions. Lungs CTAB. Gastrointestinal: Soft and nontender.  No distention. No abdominal bruits. No CVA tenderness. Musculoskeletal: No lower extremity tenderness nor edema.  No joint effusions. Neurologic:  No gross focal neurologic deficits are appreciated. No facial droop Skin:  Skin is warm, dry and intact. No rash noted. Psychiatric: Mood and affect are normal. Speech and behavior are normal.  ____________________________________________   LABS (all labs ordered are listed, but only abnormal  results are displayed)  Results for orders placed or performed during the hospital encounter of 06/30/17 (from the past 24 hour(s))  Lipase, blood     Status: Abnormal   Collection Time: 06/30/17  1:52 AM  Result Value Ref Range   Lipase 52 (H) 11 - 51 U/L  Comprehensive metabolic panel     Status: Abnormal   Collection Time: 06/30/17  1:52 AM  Result Value Ref Range   Sodium 134 (L) 135 - 145 mmol/L   Potassium 3.9 3.5 - 5.1 mmol/L   Chloride 100 (L) 101 - 111 mmol/L   CO2 23 22 - 32 mmol/L   Glucose, Bld 109 (H) 65 - 99 mg/dL   BUN 14 6 - 20 mg/dL   Creatinine, Ser 1.19 0.61 - 1.24 mg/dL   Calcium 8.4 (L) 8.9 - 10.3 mg/dL   Total Protein 7.6 6.5 - 8.1 g/dL   Albumin 4.1 3.5 - 5.0 g/dL   AST 43 (H) 15 - 41 U/L   ALT 29 17 - 63 U/L   Alkaline Phosphatase 78 38 - 126 U/L   Total Bilirubin 0.4 0.3 - 1.2 mg/dL   GFR calc non Af Amer >60 >60 mL/min   GFR calc Af Amer >60 >60 mL/min   Anion gap 11 5 - 15  CBC     Status: None   Collection Time: 06/30/17  1:52 AM  Result Value Ref Range   WBC 8.5 3.8 - 10.6 K/uL   RBC 4.53 4.40 - 5.90 MIL/uL   Hemoglobin 15.3 13.0 - 18.0 g/dL   HCT 14.7 82.9 - 56.2 %   MCV 96.5 80.0 - 100.0 fL   MCH 33.8 26.0 - 34.0 pg   MCHC 35.1 32.0 - 36.0 g/dL   RDW 13.0 86.5 - 78.4 %   Platelets 164 150 - 440 K/uL   ____________________________________________  EKG____________________________________________  RADIOLOGY   ____________________________________________   PROCEDURES  Procedure(s) performed:  Procedures    Critical Care performed: no ____________________________________________   INITIAL IMPRESSION / ASSESSMENT AND PLAN / ED COURSE  Pertinent labs & imaging results that were available during my care of the patient were reviewed by me and considered in my medical decision making (see chart for details).  DDX: gastritis, enteritis, pancreatitis, substance abuse, cholecystitis  Alex [name] is a 41 y.o. who presents to the  ED with epigastric pain and symptoms consistent with gastritis secondary to alcohol use. His abdominal exam soft and benign. Blood work is at baseline. Does not appear consistent with acute pancreatitis. He has no fevers or right upper quadrant pain to suggest acute biliary pathology and blood work is otherwise reassuring. Patient still follow-up with PCP and given referral to GI.      ____________________________________________   FINAL CLINICAL IMPRESSION(S) / ED DIAGNOSES  Final diagnoses:  Epigastric pain  Alcohol abuse      NEW MEDICATIONS STARTED DURING THIS VISIT:  Discharge Medication List as of 06/30/2017  5:34 AM       Note:  This document was prepared using Dragon voice recognition software and may include unintentional dictation errors.  Willy Eddy, MD 06/30/17 [phone]

## 2017-06-30 NOTE — ED Notes (Signed)
Spoke with legal guardian Shirlyn Goltz at [phone] who states pt would have to ride the bus home.

## 2017-07-29 ENCOUNTER — Other Ambulatory Visit: Payer: Self-pay | Admitting: Psychiatry

## 2017-07-30 ENCOUNTER — Emergency Department
Admission: EM | Admit: 2017-07-30 | Discharge: 2017-07-31 | Disposition: A | Discharge status: Home / Self Care | Payer: Medicaid Other | Attending: Emergency Medicine | Admitting: Emergency Medicine

## 2017-07-30 DIAGNOSIS — F17210 Nicotine dependence, cigarettes, uncomplicated: Secondary | ICD-10-CM | POA: Insufficient documentation

## 2017-07-30 DIAGNOSIS — Z9114 Patient's other noncompliance with medication regimen: Secondary | ICD-10-CM | POA: Diagnosis not present

## 2017-07-30 DIAGNOSIS — Z79899 Other long term (current) drug therapy: Secondary | ICD-10-CM | POA: Diagnosis not present

## 2017-07-30 DIAGNOSIS — Y908 Blood alcohol level of 240 mg/100 ml or more: Secondary | ICD-10-CM | POA: Insufficient documentation

## 2017-07-30 DIAGNOSIS — F10920 Alcohol use, unspecified with intoxication, uncomplicated: Secondary | ICD-10-CM | POA: Insufficient documentation

## 2017-07-30 DIAGNOSIS — R45851 Suicidal ideations: Secondary | ICD-10-CM | POA: Diagnosis not present

## 2017-07-30 LAB — COMPREHENSIVE METABOLIC PANEL
ALBUMIN: 4.3 g/dL (ref 3.5–5.0)
ALT: 29 U/L (ref 17–63)
AST: 33 U/L (ref 15–41)
Alkaline Phosphatase: 78 U/L (ref 38–126)
Anion gap: 12 (ref 5–15)
BUN: 9 mg/dL (ref 6–20)
CHLORIDE: 104 mmol/L (ref 101–111)
CO2: 23 mmol/L (ref 22–32)
Calcium: 8.9 mg/dL (ref 8.9–10.3)
Creatinine, Ser: 0.73 mg/dL (ref 0.61–1.24)
GFR calc Af Amer: >60 mL/min (ref >60)
GFR calc non Af Amer: >60 mL/min (ref >60)
GLUCOSE: 96 mg/dL (ref 65–99)
POTASSIUM: 3.9 mmol/L (ref 3.5–5.1)
SODIUM: 139 mmol/L (ref 135–145)
Total Bilirubin: 0.5 mg/dL (ref 0.3–1.2)
Total Protein: 7.9 g/dL (ref 6.5–8.1)

## 2017-07-30 LAB — ACETAMINOPHEN LEVEL: Acetaminophen (Tylenol), Serum: <10 ug/mL — ABNORMAL LOW (ref 10–30)

## 2017-07-30 LAB — CBC
HEMATOCRIT: 48.9 % (ref 40.0–52.0)
Hemoglobin: 16.5 g/dL (ref 13.0–18.0)
MCH: 33.3 pg (ref 26.0–34.0)
MCHC: 33.7 g/dL (ref 32.0–36.0)
MCV: 99 fL (ref 80.0–100.0)
PLATELETS: 196 10*3/uL (ref 150–440)
RBC: 4.94 MIL/uL (ref 4.40–5.90)
RDW: 12.9 % (ref 11.5–14.5)
WBC: 6.5 10*3/uL (ref 3.8–10.6)

## 2017-07-30 LAB — ETHANOL: ALCOHOL ETHYL (B): 307 mg/dL — AB (ref <10)

## 2017-07-30 LAB — SALICYLATE LEVEL: Salicylate Lvl: <7 mg/dL (ref 2.8–30.0)

## 2017-07-30 MED ORDER — IBUPROFEN 600 MG PO TABS: 600.0000 mg | ORAL_TABLET | Freq: Three times a day (TID) | ORAL | Status: DC | PRN

## 2017-07-30 MED ORDER — TRAZODONE HCL 50 MG PO TABS
50.0000 mg | ORAL_TABLET | Freq: Every day | ORAL | Status: DC
  Administered 2017-07-31 (×2): 50 mg via ORAL
  Filled 2017-07-30 (×2): qty 1

## 2017-07-30 MED ORDER — LORAZEPAM 2 MG/ML IJ SOLN: 0.0000 mg | Freq: Two times a day (BID) | INTRAMUSCULAR | Status: DC

## 2017-07-30 MED ORDER — VITAMIN B-1 100 MG PO TABS
100.0000 mg | ORAL_TABLET | Freq: Every day | ORAL | Status: DC
  Administered 2017-07-31: 100 mg via ORAL
  Filled 2017-07-30: qty 1

## 2017-07-30 MED ORDER — ONDANSETRON HCL 4 MG PO TABS
4.0000 mg | ORAL_TABLET | Freq: Three times a day (TID) | ORAL | Status: DC | PRN
Start: 2017-07-30 — End: 2017-08-01

## 2017-07-30 MED ORDER — LORAZEPAM 2 MG PO TABS: 0.0000 mg | ORAL_TABLET | Freq: Two times a day (BID) | ORAL | Status: DC

## 2017-07-30 MED ORDER — LORAZEPAM 2 MG/ML IJ SOLN
0.0000 mg | Freq: Four times a day (QID) | INTRAMUSCULAR | Status: DC
Start: 2017-07-31 — End: 2017-08-01

## 2017-07-30 MED ORDER — LORAZEPAM 2 MG PO TABS
0.0000 mg | ORAL_TABLET | Freq: Four times a day (QID) | ORAL | Status: DC
  Administered 2017-07-31 (×2): 1 mg via ORAL

## 2017-07-30 MED ORDER — THIAMINE HCL 100 MG/ML IJ SOLN: 100.0000 mg | Freq: Every day | INTRAMUSCULAR | Status: DC

## 2017-07-30 NOTE — ED Provider Notes (Addendum)
[facility] Emergency Department Provider Note   ____________________________________________   First MD Initiated Contact with Patient 07/30/17 2335     (approximate)  I have reviewed the triage vital signs and the nursing notes.   HISTORY  Chief Complaint Suicidal    HPI Alex [name] is a 41 y.o. male who comes into the hospital today stating that his eye died and he has been thinking a lot about hurting himself. He reports that he's been thinking about hurting other people 2. He reports that he is out of his psychiatric medications because he cannot afford them. He reports at this point is likely just does not know what to do. He feels like a lot of really bad stuff is going on he just cannot control it. He has not taken his meds he states about 2-3 days. He states that he does take seizure medications as well but he only missed tonight. He has been taking them all the other days. The patient states that he's only had 2-3 beers today. He is been having a hard time dealing with everything and he states is not sure what to do anymore. The patient is here today for further evaluation of his symptoms. He reports that he is here for help.   Past Medical History:  Diagnosis Date  . Alcohol abuse   . Anxiety   . Cocaine abuse (HCC) 05/27/2016  . Depression   . Seizures [facility])     Patient Active Problem List   Diagnosis Date Noted  . Substance induced mood disorder (HCC) 12/30/2016  . Moderate recurrent major depression (HCC) 12/30/2016  . Cocaine abuse (HCC)   . ADHD 08/11/2016  . Alcohol dependence with unspecified alcohol-induced disorder (HCC) 06/28/2016  . Alcohol withdrawal (HCC) 06/28/2016  . Cocaine use disorder, moderate, dependence (HCC) 06/28/2016  . Unspecified Depressive disorder 06/28/2016  . Tobacco use disorder 06/28/2016    Past Surgical History:  Procedure Laterality Date  . FINGER SURGERY      Prior to Admission medications     Medication Sig Start Date End Date Taking? Authorizing Provider  citalopram (CELEXA) 20 MG tablet TAKE TWO TABLETS BY MOUTH EVERY DAY Patient taking differently: TAKE 1 TABLET (20MG ) BY MOUTH DAILY 07/29/17  Yes Clapacs, Jackquline Denmark, MD  Multiple Vitamin (MULTIVITAMIN) tablet Take 1 tablet by mouth daily.   Yes [provider]  omeprazole (PRILOSEC) 20 MG capsule Take 20 mg by mouth 2 (two) times daily before a meal.   Yes [provider]  traZODone (DESYREL) 150 MG tablet Take 1 tablet (150 mg total) by mouth at bedtime. 12/30/16  Yes Clapacs, Jackquline Denmark, MD  valproic acid (DEPAKENE) 250 MG capsule TAKE THREE CAPSULES BY MOUTH AT BEDTIME 06/20/17  Yes Clapacs, Jackquline Denmark, MD    Allergies Peanut butter flavor  No family history on file.  Social History Social History   Tobacco Use  . Smoking status: Current Every Day Smoker    Packs/day: 1.00    Types: Cigarettes  . Smokeless tobacco: Never Used  Substance Use Topics  . Alcohol use: Yes    Alcohol/week: 2.4 - 3.0 oz    Types: 4 - 5 Cans of beer per week    Comment: pt admits to drinking 6+ beers a day  . Drug use: Yes    Types: Marijuana, Cocaine    Comment: cocaine today    Review of Systems  Constitutional: No fever/chills Eyes: No visual changes. ENT: No sore throat.  Cardiovascular: Denies chest pain. Respiratory: Denies shortness of breath. Gastrointestinal: No abdominal pain.  No nausea, no vomiting.  No diarrhea.  No constipation. Genitourinary: Negative for dysuria. Musculoskeletal: Negative for back pain. Skin: Negative for rash. Neurological: Negative for headaches, focal weakness or numbness. Psych: Suicidal ideation  ____________________________________________   PHYSICAL EXAM:  VITAL SIGNS: ED Triage Vitals [07/30/17 2222]  Enc Vitals Group     BP      Pulse      Resp      Temp      Temp src      SpO2      Weight 210 lb (95.3 kg)     Height 5\' 6"  (1.676 m)     Head Circumference      Peak  Flow      Pain Score 0     Pain Loc      Pain Edu?      Excl. in GC?     Constitutional: Alert and oriented. Well appearing and in no acute distress. Eyes: Conjunctivae are normal. PERRL. EOMI. Head: Atraumatic. Nose: No congestion/rhinnorhea. Mouth/Throat: Mucous membranes are moist.  Oropharynx non-erythematous. Cardiovascular: Normal rate, regular rhythm. Grossly normal heart sounds.  Good peripheral circulation. Respiratory: Normal respiratory effort.  No retractions. Lungs CTAB. Gastrointestinal: Soft and nontender. No distention. Musculoskeletal: Tenderness to palpation of mid lower back Neurologic:  Normal speech and language.  Skin:  Skin is warm, dry and intact.  Psychiatric: Mood and affect are normal.   ____________________________________________   LABS (all labs ordered are listed, but only abnormal results are displayed)  Labs Reviewed  ETHANOL - Abnormal; Notable for the following components:      Result Value   Alcohol, Ethyl (B) 307 (*)    All other components within normal limits  ACETAMINOPHEN LEVEL - Abnormal; Notable for the following components:   Acetaminophen (Tylenol), Serum <10 (*)    All other components within normal limits  URINE DRUG SCREEN, QUALITATIVE ([facility] ONLY) - Abnormal; Notable for the following components:   Cocaine Metabolite,Ur [place] POSITIVE (*)    Cannabinoid 50 Ng, Ur [place] POSITIVE (*)    All other components within normal limits  COMPREHENSIVE METABOLIC PANEL  SALICYLATE LEVEL  CBC   ____________________________________________  EKG  none ____________________________________________  RADIOLOGY  No results found.  ____________________________________________   PROCEDURES  Procedure(s) performed: None  Procedures  Critical Care performed: No  ____________________________________________   INITIAL IMPRESSION / ASSESSMENT AND PLAN / ED COURSE  As part of my medical decision making, I reviewed the following data  within the electronic MEDICAL RECORD NUMBER Notes from prior ED visits and [place] Controlled Substance Database   This is a 41 year old male who comes into the hospital today with some suicidal ideation. The patient has been drinking and states that he has not taken his medication. The patient states that he is here for help because he cannot cope anymore. We did check some blood work on the patient and his alcohol is elevated. We will wait for the patient to metabolize his alcohol and then have him evaluated by TTS and by tele psychiatry. The patient will be moved to the behavioral health unit.   Clinical Course as of Aug 02 435  Wynelle Link Jul 31, 2017  1906 Patient with no SI or HI, seen and evaluated by psychiatry they do not feel he needs to stay for inpatient they feel he should go to outpatient AA or other meetings, we will discharge the patient.  Contracts for safety.  [JM]    Clinical Course User Index [JM] Jeanmarie PlantMcShane, James A, MD     ____________________________________________   FINAL CLINICAL IMPRESSION(S) / ED DIAGNOSES  Final diagnoses:  Suicidal ideation  Alcoholic intoxication without complication (HCC)      NEW MEDICATIONS STARTED DURING THIS VISIT:  This SmartLink is deprecated. Use AVSMEDLIST instead to display the medication list for a patient.   Note:  This document was prepared using Dragon voice recognition software and may include unintentional dictation errors.    Rebecka ApleyWebster, Alton Tremblay P, MD 07/31/17 [id number]    Rebecka ApleyWebster, Viren Lebeau P, MD 08/01/17 [phone]

## 2017-07-30 NOTE — ED Notes (Signed)
Alcohol level of 307 reported to Dr Zenda AlpersWebster; acknowledged

## 2017-07-30 NOTE — ED Triage Notes (Signed)
Pt states that his aunt passed away this week and he has not had his mental health medications because he cannot afford it and he has not taken his seizure medication - he states that he woke up this morning and decided to drink all day and now he wants to kill himself or hurt someone else - he states that he is going to jump [place] the bridge [place] the 1st chance he gets

## 2017-07-30 NOTE — ED Notes (Signed)
[facility] by [facility] [employer] officer Crisp for mental health evaluation.

## 2017-07-31 LAB — URINE DRUG SCREEN, QUALITATIVE (ARMC ONLY)
AMPHETAMINES, UR SCREEN: NOT DETECTED
Barbiturates, Ur Screen: NOT DETECTED
Benzodiazepine, Ur Scrn: NOT DETECTED
Cannabinoid 50 Ng, Ur ~~LOC~~: POSITIVE — AB
Cocaine Metabolite,Ur ~~LOC~~: POSITIVE — AB
MDMA (ECSTASY) UR SCREEN: NOT DETECTED
Methadone Scn, Ur: NOT DETECTED
Opiate, Ur Screen: NOT DETECTED
PHENCYCLIDINE (PCP) UR S: NOT DETECTED
Tricyclic, Ur Screen: NOT DETECTED

## 2017-07-31 MED ORDER — LORAZEPAM 1 MG PO TABS
ORAL_TABLET | ORAL | Status: AC
  Filled 2017-07-31: qty 1

## 2017-07-31 MED ORDER — VALPROIC ACID 250 MG PO CAPS
750.0000 mg | ORAL_CAPSULE | Freq: Every day | ORAL | Status: DC
  Administered 2017-07-31 (×2): 750 mg via ORAL
  Filled 2017-07-31 (×2): qty 3

## 2017-07-31 MED ORDER — CITALOPRAM HYDROBROMIDE 20 MG PO TABS
20.0000 mg | ORAL_TABLET | Freq: Every day | ORAL | Status: DC
  Administered 2017-07-31: 20 mg via ORAL
  Filled 2017-07-31: qty 1

## 2017-07-31 NOTE — ED Notes (Signed)

## 2017-07-31 NOTE — ED Notes (Signed)
Report was received from Amy B., RN; Pt. Verbalizes no complaints or distress; denies S.I./Hi. Continue to monitor with 15 min. Monitoring. 

## 2017-07-31 NOTE — ED Notes (Signed)
ED BHU PLACEMENT JUSTIFICATION Is the patient under IVC or is there intent for IVC: No. Is the patient medically cleared: No. Is there vacancy in the ED BHU: No. Is the population mix appropriate for patient: No. Is the patient awaiting placement in inpatient or outpatient setting: No. Has the patient had a psychiatric consult: No. Survey of unit performed for contraband, proper placement and condition of furniture, tampering with fixtures in bathroom, shower, and each patient room: No. APPEARANCE/BEHAVIOR calm, cooperative and adequate rapport can be established NEURO ASSESSMENT Orientation: time, place and person Hallucinations: No.None noted (Hallucinations) Speech: Normal Gait: normal RESPIRATORY ASSESSMENT Normal expansion.  Clear to auscultation.  No rales, rhonchi, or wheezing. CARDIOVASCULAR ASSESSMENT regular rate and rhythm, S1, S2 normal, no murmur, click, rub or gallop GASTROINTESTINAL ASSESSMENT soft, nontender, BS WNL, no r/g EXTREMITIES normal strength, tone, and muscle mass PLAN OF CARE Provide calm/safe environment. Vital signs assessed twice daily. ED BHU Assessment once each 12-hour shift. Collaborate with intake RN daily or as condition indicates. Assure the ED provider has rounded once each shift. Provide and encourage hygiene. Provide redirection as needed. Assess for escalating behavior; address immediately and inform ED provider.  Assess family dynamic and appropriateness for visitation as needed: Yes.   Educate the patient/family about BHU procedures/visitation: Yes.   

## 2017-07-31 NOTE — ED Notes (Signed)
Report to Margaret, RN in ED BHU.  

## 2017-07-31 NOTE — BH Assessment (Signed)
Assessment Note  Alex [name] is an 41 y.o. male. The pt came in stating he wants to kill himself by jumping off of a bridge.  He stated he is upset because he has an aunt who died recently.  He stated he has been feeling this way for a few days.  He came in after drinking several beers.  He was unable to state how many beers he had been drinking before coming to the emergency room.  His blood alcohol level was 307 when admitted.  He denied any other substances, however, the pt tested positive for cocaine.  He stated he wants to stop drinking.  He denies HI, and psychosis.  Diagnosis: Major Depressive Disorder, Severe  Past Medical History:  Past Medical History:  Diagnosis Date  . Alcohol abuse   . Anxiety   . Cocaine abuse (HCC) 05/27/2016  . Depression   . Seizures (HCC)     Past Surgical History:  Procedure Laterality Date  . FINGER SURGERY      Family History: No family history on file.  Social History:  reports that he has been smoking cigarettes.  He has been smoking about 1.00 pack per day. he has never used smokeless tobacco. He reports that he drinks about 2.4 - 3.0 oz of alcohol per week. He reports that he uses drugs. Drugs: Marijuana and Cocaine.  Additional Social History:  Alcohol / Drug Use Pain Medications: See PTA Prescriptions: See PTA Over the Counter: See PTA History of alcohol / drug use?: Yes Substance #1 Name of Substance 1: alcohol 1 - Age of First Use: unknown 1 - Amount (size/oz): "a lot of beer" 1 - Frequency: daily 1 - Duration: 15 years 1 - Last Use / Amount: 07/30/2017 Substance #2 Name of Substance 2: cocaine  CIWA: CIWA-Ar BP: 119/82 Pulse Rate: 88 Nausea and Vomiting: no nausea and no vomiting Tactile Disturbances: none Tremor: no tremor Auditory Disturbances: not present Paroxysmal Sweats: no sweat visible Visual Disturbances: not present Anxiety: no anxiety, at ease Headache, Fullness in Head: none present Agitation: normal  activity Orientation and Clouding of Sensorium: oriented and can do serial additions CIWA-Ar Total: 0 COWS:    Allergies:  Allergies  Allergen Reactions  . Peanut Butter Flavor     Home Medications:  (Not in a hospital admission)  OB/GYN Status:  No LMP for male patient.  General Assessment Data [facility] ED TTS Assessment: In system Is this a Tele or Face-to-Face Assessment?: Face-to-Face Is this an Initial Assessment or a Re-assessment for this encounter?: Initial Assessment Marital status: Single Maiden name: NA Living Arrangements: Parent Can pt return to current living arrangement?: Yes Admission Status: Voluntary Is patient capable of signing voluntary admission?: Yes Referral Source: Self/Family/Friend Insurance type: Medicaid     Crisis Care Plan Living Arrangements: Parent Legal Guardian: Other:(Self) Name of Psychiatrist: Reports of none Name of Therapist: Reports of none  Education Status Is patient currently in school?: No Current Grade: NA Highest grade of school patient has completed: 10th Name of school: NA Contact person: n/a  Risk to self with the past 6 months Suicidal Ideation: Yes-Currently Present Has patient been a risk to self within the past 6 months prior to admission? : Yes Suicidal Intent: Yes-Currently Present Has patient had any suicidal intent within the past 6 months prior to admission? : Yes Is patient at risk for suicide?: Yes Suicidal Plan?: Yes-Currently Present Has patient had any suicidal plan within the past 6 months  prior to admission? : Yes Specify Current Suicidal Plan: jump off of a bridge Access to Means: Yes Specify Access to Suicidal Means: can get to bridge What has been your use of drugs/alcohol within the last 12 months?: cocaine and alcohol use Previous Attempts/Gestures: Yes How many times?: 5 Other Self Harm Risks: none Triggers for Past Attempts: Other (Comment)(death ) Intentional Self  Injurious Behavior: None Family Suicide History: No Recent stressful life event(s): Loss (Comment)(aunt died) Persecutory voices/beliefs?: No Depression: Yes Depression Symptoms: Feeling worthless/self pity Substance abuse history and/or treatment for substance abuse?: Yes Suicide prevention information given to non-admitted patients: Not applicable  Risk to Others within the past 6 months Homicidal Ideation: No Does patient have any lifetime risk of violence toward others beyond the six months prior to admission? : No Thoughts of Harm to Others: No Current Homicidal Intent: No Current Homicidal Plan: No Access to Homicidal Means: No Identified Victim: NA History of harm to others?: No Assessment of Violence: None Noted Violent Behavior Description: none Does patient have access to weapons?: No Criminal Charges Pending?: Yes Describe Pending Criminal Charges: communicating threats and probation violation Does patient have a court date: Yes Court Date: 08/04/17 Is patient on probation?: Unknown  Psychosis Hallucinations: None noted Delusions: None noted  Mental Status Report Appearance/Hygiene: Unremarkable, In scrubs Eye Contact: Poor Motor Activity: Unremarkable, Freedom of movement Speech: Logical/coherent, Unremarkable Level of Consciousness: Drowsy Mood: Irritable Affect: Irritable Anxiety Level: None Thought Processes: Coherent, Relevant Judgement: Impaired Orientation: Person, Place, Time, Situation, Appropriate for developmental age Obsessive Compulsive Thoughts/Behaviors: None  Cognitive Functioning Concentration: Normal Memory: Recent Intact, Remote Intact IQ: Average Insight: Fair Impulse Control: Fair Appetite: Good Weight Loss: 0 Weight Gain: 0 Sleep: No Change Total Hours of Sleep: 7 Vegetative Symptoms: None  ADLScreening [facility] Assessment Services) Patient's cognitive ability adequate to safely complete daily activities?: Yes Patient able to  express need for assistance with ADLs?: Yes Independently performs ADLs?: Yes (appropriate for developmental age)  Prior Inpatient Therapy Prior Inpatient Therapy: Yes Prior Therapy Dates: 11/2016, 06/2016, 05/2016 & 07/2011 Prior Therapy Facilty/Provider(s): [facility], [facility] Reason for Treatment: SI and SA  Prior Outpatient Therapy Prior Outpatient Therapy: No Prior Therapy Dates: Reports of none Prior Therapy Facilty/Provider(s): Reports of none Reason for Treatment: Reports of none Does patient have an ACCT team?: No Does patient have Intensive In-House Services?  : No Does patient have [facility] services? : No Does patient have P4CC services?: No  ADL Screening (condition at time of admission) Patient's cognitive ability adequate to safely complete daily activities?: Yes Is the patient deaf or have difficulty hearing?: No Does the patient have difficulty seeing, even when wearing glasses/contacts?: No Does the patient have difficulty concentrating, remembering, or making decisions?: No Patient able to express need for assistance with ADLs?: Yes Does the patient have difficulty dressing or bathing?: No Independently performs ADLs?: Yes (appropriate for developmental age) Does the patient have difficulty walking or climbing stairs?: No Weakness of Legs: None Weakness of Arms/Hands: None  Home Assistive Devices/Equipment Home Assistive Devices/Equipment: None  Therapy Consults (therapy consults require a physician order) PT Evaluation Needed: No OT Evalulation Needed: No SLP Evaluation Needed: No Abuse/Neglect Assessment (Assessment to be complete while patient is alone) Abuse/Neglect Assessment Can Be Completed: Yes Physical Abuse: Denies Verbal Abuse: Denies Sexual Abuse: Denies Exploitation of patient/patient's resources: Denies Self-Neglect: Denies Values / Beliefs Cultural Requests During Hospitalization: None Spiritual Requests During Hospitalization:  None Consults Spiritual Care Consult Needed: No Social Work Consult Needed:  No Advance Directives (For Healthcare) Does Patient Have a Medical Advance Directive?: No    Additional Information 1:1 In Past 12 Months?: No CIRT Risk: No Elopement Risk: No Does patient have medical clearance?: Yes     Disposition:  Disposition Initial Assessment Completed for this Encounter: Yes Disposition of Patient: Other dispositions Other disposition(s): Other (Comment)(SOC)  On Site Evaluation by:   Reviewed with Physician:    Ottis Stain 07/31/2017 4:12 AM

## 2017-07-31 NOTE — ED Notes (Signed)
Patient received PM snack. 

## 2017-07-31 NOTE — ED Notes (Signed)
Pt reports his aunt died yesterday and he has been feeling depressed related to this. Pt states he wants to harm himself but states he does not wish to go into details. Pt told triage nurse that he wanted to [place] [facility] [place]. Pt is calm and cooperative at this time.

## 2017-07-31 NOTE — ED Notes (Signed)

## 2017-07-31 NOTE — ED Notes (Signed)
Pt's blood pressure 148/106. EDP notified. No new orders given. RN administered 1 mg Ativan per CIWA protocol.

## 2017-07-31 NOTE — ED Notes (Signed)
Report was received Dorise HissElizabeth C., RN; Pt. Verbalizes  complaints of having Depression; with alcohol intoxication; distress of, that his aunt recently died; verbalizes having S.I.; denies having Hi. Continue to monitor with 15 min. Monitoring.

## 2017-07-31 NOTE — ED Notes (Signed)
Patient asleep in room. No noted distress or abnormal behavior. Will continue 15 minute checks and observation by security cameras for safety. 

## 2017-07-31 NOTE — ED Notes (Addendum)
Pt agitated after speaking with SOC. "That doctor told me I've been here so much I don't know nothing." Pt also irritated with something the doctor said about his drinking (specific comment unknown by this Clinical research associatewriter). Pt is wanting to speak with Dr. Toni Amendlapacs tomorrow if possible.  RN verbally de-escalated patient. Maintained on 15 minute checks and observation by security camera for safety.

## 2017-07-31 NOTE — ED Notes (Signed)
BEHAVIORAL HEALTH ROUNDING  Patient sleeping: No.  Patient alert and oriented: yes  Behavior appropriate: Yes. ; If no, describe:  Nutrition and fluids offered: Yes  Toileting and hygiene offered: Yes  Sitter present: not applicable, Q 15 min safety rounds and observation.  Law enforcement present: Yes ODS  

## 2017-08-17 ENCOUNTER — Other Ambulatory Visit: Payer: Self-pay | Admitting: Psychiatry

## 2017-08-19 ENCOUNTER — Emergency Department: Payer: Medicaid Other

## 2017-08-19 ENCOUNTER — Emergency Department
Admission: EM | Admit: 2017-08-19 | Discharge: 2017-08-20 | Disposition: A | Discharge status: Home / Self Care | Payer: Medicaid Other | Attending: Emergency Medicine | Admitting: Emergency Medicine

## 2017-08-19 ENCOUNTER — Encounter: Payer: Self-pay | Admitting: Emergency Medicine

## 2017-08-19 DIAGNOSIS — Z79899 Other long term (current) drug therapy: Secondary | ICD-10-CM | POA: Insufficient documentation

## 2017-08-19 DIAGNOSIS — X58XXXA Exposure to other specified factors, initial encounter: Secondary | ICD-10-CM | POA: Insufficient documentation

## 2017-08-19 DIAGNOSIS — S99911A Unspecified injury of right ankle, initial encounter: Secondary | ICD-10-CM | POA: Diagnosis present

## 2017-08-19 DIAGNOSIS — S93401A Sprain of unspecified ligament of right ankle, initial encounter: Secondary | ICD-10-CM | POA: Diagnosis not present

## 2017-08-19 DIAGNOSIS — Y9301 Activity, walking, marching and hiking: Secondary | ICD-10-CM | POA: Insufficient documentation

## 2017-08-19 DIAGNOSIS — F17210 Nicotine dependence, cigarettes, uncomplicated: Secondary | ICD-10-CM | POA: Insufficient documentation

## 2017-08-19 DIAGNOSIS — Y929 Unspecified place or not applicable: Secondary | ICD-10-CM | POA: Diagnosis not present

## 2017-08-19 DIAGNOSIS — Y999 Unspecified external cause status: Secondary | ICD-10-CM | POA: Diagnosis not present

## 2017-08-19 MED ORDER — OXYCODONE HCL 5 MG PO TABS
5.0000 mg | ORAL_TABLET | Freq: Once | ORAL | Status: DC
  Filled 2017-08-19: qty 1

## 2017-08-19 MED ORDER — ACETAMINOPHEN 500 MG PO TABS
1000.0000 mg | ORAL_TABLET | Freq: Once | ORAL | Status: AC
  Administered 2017-08-19: 1000 mg via ORAL
  Filled 2017-08-19: qty 2

## 2017-08-19 MED ORDER — IBUPROFEN 800 MG PO TABS: 800.0000 mg | ORAL_TABLET | Freq: Three times a day (TID) | ORAL | 0 refills | Status: DC | PRN

## 2017-08-19 NOTE — ED Notes (Addendum)
Per Charge RN, pt's mother Karin GoldenConsulting civil engineerLorraine called by this RN to give verbal consent that pt can be discharged and take taxi to their home. Pt's mother verbally consents that pt can come home in taxi provided by ED due to [place] stating she "can't pay." Danelle EarthlyNoel RN also verified on phone pt's mother's consent for pt to come home in taxi.

## 2017-08-19 NOTE — ED Triage Notes (Signed)
Patient was moving a heavy bed bed tonight with a helper and they struggled to move it, the patient reports feeling dizzy and falling, and his right ankle is causing him pain.  He is reporting "burning pain" in his ankle.  Pt smells of etoh, and states' I had 2 beers today"

## 2017-08-19 NOTE — ED Notes (Signed)
Per EMS, pt tripped and hurt his right ankle with no LOC, back, or neck pain. Pt in NAD at this time but visibly upset about having to be taken to lobby.

## 2017-08-19 NOTE — ED Provider Notes (Addendum)
[facility] Emergency Department Provider Note  ____________________________________________  Time seen: Approximately 11:11 PM  I have reviewed the triage vital signs and the nursing notes.   HISTORY  Chief Complaint Fall and Ankle Pain   HPI Alex [name] is a 41 y.o. male with h/o polysubstance abuse who presents for evaluation of R leg pain. Patient reports that he was helping a friend move and carrying a heavy bed down the stairs when he felt and twisted his R ankle. This happened 6 hours ago. Patient able to walk on it at first but the pain and swelling got progressively worse. Patient endorses drinking alcohol tonight but denies other drugs. Patient denies head trauma or LOC. He reports that his pain is severe constant from the knee all the way to his ankle on the R and worse with movement or ambulation.  Past Medical History:  Diagnosis Date  . Alcohol abuse   . Anxiety   . Cocaine abuse (HCC) 05/27/2016  . Depression   . Seizures [facility](HCC)     Patient Active Problem List   Diagnosis Date Noted  . Substance induced mood disorder (HCC) 12/30/2016  . Moderate recurrent major depression (HCC) 12/30/2016  . Cocaine abuse (HCC)   . ADHD 08/11/2016  . Alcohol dependence with unspecified alcohol-induced disorder (HCC) 06/28/2016  . Alcohol withdrawal (HCC) 06/28/2016  . Cocaine use disorder, moderate, dependence (HCC) 06/28/2016  . Unspecified Depressive disorder 06/28/2016  . Tobacco use disorder 06/28/2016    Past Surgical History:  Procedure Laterality Date  . FINGER SURGERY      Prior to Admission medications   Medication Sig Start Date End Date Taking? Authorizing Provider  citalopram (CELEXA) 20 MG tablet TAKE TWO TABLETS BY MOUTH EVERY DAY Patient taking differently: TAKE 1 TABLET (20MG ) BY MOUTH DAILY 07/29/17   Clapacs, Jackquline DenmarkJohn T, MD  ibuprofen (ADVIL,MOTRIN) 800 MG tablet Take 1 tablet (800 mg total) by mouth every 8 (eight) hours as  needed. 08/19/17   Nita SickleVeronese, [place], MD  Multiple Vitamin (MULTIVITAMIN) tablet Take 1 tablet by mouth daily.    [provider]  omeprazole (PRILOSEC) 20 MG capsule Take 20 mg by mouth 2 (two) times daily before a meal.    [provider]  traZODone (DESYREL) 150 MG tablet Take 1 tablet (150 mg total) by mouth at bedtime. 12/30/16   Clapacs, Jackquline DenmarkJohn T, MD  valproic acid (DEPAKENE) 250 MG capsule TAKE THREE CAPSULES BY MOUTH AT BEDTIME 06/20/17   Clapacs, Jackquline DenmarkJohn T, MD    Allergies Peanut butter flavor  No family history on file.  Social History Social History   Tobacco Use  . Smoking status: Current Every Day Smoker    Packs/day: 1.00    Types: Cigarettes  . Smokeless tobacco: Never Used  Substance Use Topics  . Alcohol use: Yes    Alcohol/week: 2.4 - 3.0 oz    Types: 4 - 5 Cans of beer per week    Comment: pt admits to drinking 6+ beers a day  . Drug use: Yes    Types: Marijuana, Cocaine    Comment: cocaine today    Review of Systems  Constitutional: Negative for fever. Eyes: Negative for visual changes. ENT: Negative for sore throat. Neck: No neck pain  Cardiovascular: Negative for chest pain. Respiratory: Negative for shortness of breath. Gastrointestinal: Negative for abdominal pain, vomiting or diarrhea. Genitourinary: Negative for dysuria. Musculoskeletal: Negative for back pain. + RLE pain and swelling Skin: Negative for rash. Neurological: Negative  for headaches, weakness or numbness. Psych: No SI or HI  ____________________________________________   PHYSICAL EXAM:  VITAL SIGNS: ED Triage Vitals [08/19/17 2243]  Enc Vitals Group     BP 122/74     Pulse Rate 97     Resp 18     Temp 98.5 F (36.9 C)     Temp Source Oral     SpO2 98 %     Weight      Height      Head Circumference      Peak Flow      Pain Score 10     Pain Loc      Pain Edu?      Excl. in GC?     Constitutional: Alert and oriented. Well appearing and in no  apparent distress. Smells strongly of alcohol HEENT:      Head: Normocephalic and atraumatic.         Eyes: Conjunctivae are normal. Sclera is non-icteric.       Mouth/Throat: Mucous membranes are moist.       Neck: Supple with no signs of meningismus. No cspine ttp Cardiovascular: Regular rate and rhythm. No murmurs, gallops, or rubs. 2+ symmetrical distal pulses are present in all extremities. No JVD. Respiratory: Normal respiratory effort. Lungs are clear to auscultation bilaterally. No wheezes, crackles, or rhonchi.  Gastrointestinal: Soft, non tender, and non distended with positive bowel sounds. No rebound or guarding. Musculoskeletal: Swelling and ttp over the R lateral malleolus and anterior shin where there is a small abrasion. Full painless ROM of b/l hip and b/l knee. No deformities. NO erythema, cyanosis, or lacerations. Strong DP and PT pulses. No T and L spine ttp Neurologic: Normal speech and language. Face is symmetric. Moving all extremities. No gross focal neurologic deficits are appreciated. Skin: Skin is warm, dry and intact. No rash noted. Psychiatric: Mood and affect are normal. Speech and behavior are normal.  ____________________________________________   LABS (all labs ordered are listed, but only abnormal results are displayed)  Labs Reviewed - No data to display ____________________________________________  EKG  none ____________________________________________  RADIOLOGY  XR RLE: Negative  ____________________________________________   PROCEDURES  Procedure(s) performed: Yes .Splint Application Date/Time: 08/19/2017 11:51 PM Performed by: Nita Sickle, MD Authorized by: Nita Sickle, MD   Consent:    Consent obtained:  Verbal   Consent given by:  Patient   Risks discussed:  Discoloration, numbness and pain   Alternatives discussed:  No treatment Pre-procedure details:    Sensation:  Normal Procedure details:    Laterality:   Right   Location:  Ankle   Ankle:  R ankle   Splint type: ankle air cast.   Supplies:  Prefabricated splint Post-procedure details:    Pain:  Unchanged   Sensation:  Normal   Patient tolerance of procedure:  Tolerated well, no immediate complications   Critical Care performed:  No ____________________________________________   INITIAL IMPRESSION / ASSESSMENT AND PLAN / ED COURSE  41 y.o. male with h/o polysubstance abuse who presents for evaluation of R leg pain after twisting his ankle while moving a bed earlier today. Patient with ttp over the anterior shin and ankle with swelling and abrasion. Neurovascular exam intact. XRs showed no fracture or dislocation. Exam and imaging consistent with ankle sprain. Patient given tylenol and oxycodone here for pain. Patient placed on ankle splint and crutches, and referred to Orthopedics for follow up. Will receive prescription for ibuprofen 800mg  for pain. Discussed return precautions with  patient.      As part of my medical decision making, I reviewed the following data within the electronic MEDICAL RECORD NUMBER Nursing notes reviewed and incorporated, Radiograph reviewed , Notes from prior ED visits and [place] Controlled Substance Database    Pertinent labs & imaging results that were available during my care of the patient were reviewed by me and considered in my medical decision making (see chart for details).    ____________________________________________   FINAL CLINICAL IMPRESSION(S) / ED DIAGNOSES  Final diagnoses:  Sprain of right ankle, unspecified ligament, initial encounter      NEW MEDICATIONS STARTED DURING THIS VISIT:  ED Discharge Orders        Ordered    ibuprofen (ADVIL,MOTRIN) 800 MG tablet  Every 8 hours PRN     08/19/17 2328       Note:  This document was prepared using Dragon voice recognition software and may include unintentional dictation errors.    Nita SickleVeronese, [place], MD 08/19/17 [id number]    Nita SickleVeronese,  Juana Di­az, MD 08/19/17 2352

## 2017-08-19 NOTE — ED Notes (Addendum)
Pt's mother Karin GoldenLorraine notified pt is being discharged, mother states she has no transportation to get to this ED or money for taxi. Mother notified she is pt's legal guardian which mother verbalizes that she is and states "I can't get up there, there is no one I can call to bring me up there and I don't have money for a taxi." Consulting civil engineerCharge RN notified.

## 2017-08-19 NOTE — Discharge Instructions (Signed)
Ice several times a day, elevate leg, bear weight as tolerated, take ibuprofen 800 mg every 6 hours with meals or snack. Follow up with Orthopedics in 1 week. Return to the ER for worsening pain, swelling, changes in color of your foot, numbness, weakness.

## 2017-08-20 NOTE — ED Notes (Signed)

## 2017-08-20 NOTE — ED Notes (Signed)
Taxi called, states they will pick pt up from ED. First RN notified and given vouchers.

## 2017-08-28 ENCOUNTER — Other Ambulatory Visit: Payer: Self-pay | Admitting: Psychiatry

## 2017-09-04 ENCOUNTER — Encounter: Payer: Self-pay | Admitting: Emergency Medicine

## 2017-09-04 ENCOUNTER — Other Ambulatory Visit: Payer: Self-pay

## 2017-09-04 ENCOUNTER — Emergency Department
Admission: EM | Admit: 2017-09-04 | Discharge: 2017-09-06 | Disposition: A | Discharge status: Home / Self Care | Payer: Medicaid Other | Attending: Emergency Medicine | Admitting: Emergency Medicine

## 2017-09-04 DIAGNOSIS — F329 Major depressive disorder, single episode, unspecified: Secondary | ICD-10-CM | POA: Diagnosis not present

## 2017-09-04 DIAGNOSIS — Z79899 Other long term (current) drug therapy: Secondary | ICD-10-CM | POA: Diagnosis not present

## 2017-09-04 DIAGNOSIS — F1910 Other psychoactive substance abuse, uncomplicated: Secondary | ICD-10-CM | POA: Insufficient documentation

## 2017-09-04 DIAGNOSIS — F17210 Nicotine dependence, cigarettes, uncomplicated: Secondary | ICD-10-CM | POA: Diagnosis not present

## 2017-09-04 DIAGNOSIS — F331 Major depressive disorder, recurrent, moderate: Secondary | ICD-10-CM | POA: Diagnosis present

## 2017-09-04 DIAGNOSIS — F32A Depression, unspecified: Secondary | ICD-10-CM

## 2017-09-04 DIAGNOSIS — F1029 Alcohol dependence with unspecified alcohol-induced disorder: Secondary | ICD-10-CM | POA: Diagnosis present

## 2017-09-04 DIAGNOSIS — F1994 Other psychoactive substance use, unspecified with psychoactive substance-induced mood disorder: Secondary | ICD-10-CM | POA: Diagnosis present

## 2017-09-04 DIAGNOSIS — F909 Attention-deficit hyperactivity disorder, unspecified type: Secondary | ICD-10-CM | POA: Diagnosis not present

## 2017-09-04 DIAGNOSIS — F1410 Cocaine abuse, uncomplicated: Secondary | ICD-10-CM | POA: Diagnosis present

## 2017-09-04 LAB — COMPREHENSIVE METABOLIC PANEL
ALK PHOS: 87 U/L (ref 38–126)
ALT: 96 U/L — AB (ref 17–63)
AST: 104 U/L — ABNORMAL HIGH (ref 15–41)
Albumin: 3.9 g/dL (ref 3.5–5.0)
Anion gap: 11 (ref 5–15)
BILIRUBIN TOTAL: 1 mg/dL (ref 0.3–1.2)
BUN: 12 mg/dL (ref 6–20)
CALCIUM: 9.3 mg/dL (ref 8.9–10.3)
CO2: 22 mmol/L (ref 22–32)
CREATININE: 0.77 mg/dL (ref 0.61–1.24)
Chloride: 99 mmol/L — ABNORMAL LOW (ref 101–111)
Glucose, Bld: 107 mg/dL — ABNORMAL HIGH (ref 65–99)
Potassium: 3.7 mmol/L (ref 3.5–5.1)
Sodium: 132 mmol/L — ABNORMAL LOW (ref 135–145)
TOTAL PROTEIN: 7.6 g/dL (ref 6.5–8.1)

## 2017-09-04 LAB — URINE DRUG SCREEN, QUALITATIVE (ARMC ONLY)
AMPHETAMINES, UR SCREEN: NOT DETECTED
BARBITURATES, UR SCREEN: NOT DETECTED
BENZODIAZEPINE, UR SCRN: NOT DETECTED
Cannabinoid 50 Ng, Ur ~~LOC~~: POSITIVE — AB
Cocaine Metabolite,Ur ~~LOC~~: POSITIVE — AB
MDMA (Ecstasy)Ur Screen: NOT DETECTED
METHADONE SCREEN, URINE: NOT DETECTED
Opiate, Ur Screen: NOT DETECTED
PHENCYCLIDINE (PCP) UR S: NOT DETECTED
Tricyclic, Ur Screen: NOT DETECTED

## 2017-09-04 LAB — CBC
HCT: 45.9 % (ref 40.0–52.0)
Hemoglobin: 15.8 g/dL (ref 13.0–18.0)
MCH: 33.4 pg (ref 26.0–34.0)
MCHC: 34.4 g/dL (ref 32.0–36.0)
MCV: 97.2 fL (ref 80.0–100.0)
PLATELETS: 164 10*3/uL (ref 150–440)
RBC: 4.73 MIL/uL (ref 4.40–5.90)
RDW: 13.4 % (ref 11.5–14.5)
WBC: 9.6 10*3/uL (ref 3.8–10.6)

## 2017-09-04 LAB — ETHANOL

## 2017-09-04 MED ORDER — ADULT MULTIVITAMIN W/MINERALS CH
1.0000 | ORAL_TABLET | Freq: Every day | ORAL | Status: DC
  Administered 2017-09-04 – 2017-09-06 (×3): 1 via ORAL
  Filled 2017-09-04 (×4): qty 1

## 2017-09-04 MED ORDER — PANTOPRAZOLE SODIUM 40 MG PO TBEC
40.0000 mg | DELAYED_RELEASE_TABLET | Freq: Every day | ORAL | Status: DC
  Administered 2017-09-04 – 2017-09-06 (×3): 40 mg via ORAL
  Filled 2017-09-04 (×3): qty 1

## 2017-09-04 MED ORDER — TRAZODONE HCL 50 MG PO TABS
150.0000 mg | ORAL_TABLET | Freq: Every day | ORAL | Status: DC
  Administered 2017-09-04 – 2017-09-05 (×2): 150 mg via ORAL
  Filled 2017-09-04 (×2): qty 1

## 2017-09-04 MED ORDER — VALPROIC ACID 250 MG PO CAPS
750.0000 mg | ORAL_CAPSULE | Freq: Every day | ORAL | Status: DC
  Administered 2017-09-04 – 2017-09-05 (×2): 750 mg via ORAL
  Filled 2017-09-04 (×3): qty 3

## 2017-09-04 MED ORDER — IBUPROFEN 600 MG PO TABS
600.0000 mg | ORAL_TABLET | Freq: Four times a day (QID) | ORAL | Status: DC | PRN
  Administered 2017-09-05 – 2017-09-06 (×3): 600 mg via ORAL
  Filled 2017-09-04 (×3): qty 1

## 2017-09-04 MED ORDER — CITALOPRAM HYDROBROMIDE 20 MG PO TABS
20.0000 mg | ORAL_TABLET | Freq: Every day | ORAL | Status: DC
  Administered 2017-09-04 – 2017-09-06 (×3): 20 mg via ORAL
  Filled 2017-09-04 (×3): qty 1

## 2017-09-04 NOTE — ED Notes (Signed)
BEHAVIORAL HEALTH ROUNDING  Patient sleeping: No.  Patient alert and oriented: yes  Behavior appropriate: Yes. ; If no, describe:  Nutrition and fluids offered: Yes  Toileting and hygiene offered: Yes  Sitter present: not applicable, Q 15 min safety rounds and observation.  Law enforcement present: Yes ODS  

## 2017-09-04 NOTE — ED Notes (Signed)

## 2017-09-04 NOTE — ED Notes (Signed)

## 2017-09-04 NOTE — ED Notes (Signed)
[facility]; Pt. Verbalizes  complaints of having Depression; has been off meds x 8-10 weeks; cocaine and cannibus abuse; increased anxiety; and distress of legal issues; verbalizes having S.I; with plan to jump off a bridge;. Denies having Hi. Continue to monitor with 15 min. Monitoring.

## 2017-09-04 NOTE — ED Triage Notes (Signed)
Pt brought in by [facility] from home for psych eval. Pt states that he is out of all of his medications except for his seizure medication. Pt states that he has not had his medications in 3-4 weeks. Pt is calm and cooperative at this time, in NAD.

## 2017-09-04 NOTE — BH Assessment (Signed)
Assessment Note  Alex [name] is an [age] y.o. male. Pt arrived to the ED by EMT. Having withdrawals coming down off Cocaine (Friday), Marijuana this morning. Pt reports signing himself in due to depression," If I leave I'm scared I'll do something stupid". Pt states that he is easily angered and hasn't had his medication in 8-10 weeks. Pt reports feeling antsy, having trouble falling asleep, and not eating well. He states that he has been "partying hard" for 3  months drinking heavily (case of beer or more). High level of anxiety. Denies A/VH. Admits SI "If I get discharged I'm going to kill myself by jumping off the bridge". Denies HI. Pt wanting assistance with alcohol, and depression. Reports recent stressors including court case in Jan for probation violation. Mother fell in a hole, "playing", in the yard and the police saw it and took pt to jail resulting in probation violation.   Diagnosis: Depression, SI, Substance Abuse  Past Medical History:  Past Medical History:  Diagnosis Date  . Alcohol abuse   . Anxiety   . Cocaine abuse (HCC) 05/27/2016  . Depression   . Seizures (HCC)     Past Surgical History:  Procedure Laterality Date  . FINGER SURGERY      Family History: No family history on file.  Social History:  reports that he has been smoking cigarettes.  He has been smoking about 1.00 pack per day. he has never used smokeless tobacco. He reports that he drinks about 2.4 - 3.0 oz of alcohol per week. He reports that he uses drugs. Drugs: Marijuana and Cocaine.  Additional Social History:  Alcohol / Drug Use History of alcohol / drug use?: Yes Substance #1 Name of Substance 1: Alcohol 1 - Age of First Use: 18 1 - Amount (size/oz): A lot  1 - Frequency: Daily 1 - Last Use / Amount: Friday Substance #2 Name of Substance 2: Marijuana 2 - Age of First Use: 18 2 - Amount (size/oz): 1-2 Blunts 2 - Frequency: Weekly 2 - Last Use / Amount: 09/04/2017  CIWA: CIWA-Ar BP: (!)  137/99 Pulse Rate: 85 Nausea and Vomiting: mild nausea with no vomiting Tactile Disturbances: none Tremor: no tremor Auditory Disturbances: not present Paroxysmal Sweats: no sweat visible Visual Disturbances: not present Anxiety: no anxiety, at ease Headache, Fullness in Head: none present Agitation: normal activity Orientation and Clouding of Sensorium: oriented and can do serial additions CIWA-Ar Total: 1 COWS:    Allergies:  Allergies  Allergen Reactions  . Peanut Butter Flavor     Home Medications:  (Not in a hospital admission)  OB/GYN Status:  No LMP for male patient.  General Assessment Data [facility] Assessment: In system Is this a Tele or Face-to-Face Assessment?: Face-to-Face Is this an Initial Assessment or a Re-assessment for this encounter?: Initial Assessment Marital status: Single Maiden name: n/a Is patient pregnant?: No Pregnancy Status: No Living Arrangements: Parent Can pt return to current living arrangement?: Yes Admission Status: Voluntary Is patient capable of signing voluntary admission?: Yes Referral Source: Self/Family/Friend Insurance type: Medicaid  Medical Screening Exam [facility] ONLY) Medical Exam completed: Yes  Crisis Care Plan Living Arrangements: Parent Legal Guardian: Other: Name of Psychiatrist: na Name of Therapist: n/a  Education Status Is patient currently in school?: No Current Grade: n/a Highest grade of school patient has completed: 12th Name of school: [employer] Contact person: n/a  Risk to self with the past 6 months Suicidal Ideation: Yes-Currently  Present Has patient been a risk to self within the past 6 months prior to admission? : Yes Suicidal Intent: Yes-Currently Present Has patient had any suicidal intent within the past 6 months prior to admission? : Yes Is patient at risk for suicide?: Yes Suicidal Plan?: Yes-Currently Present Has patient had any suicidal plan  within the past 6 months prior to admission? : Yes Specify Current Suicidal Plan: Jumping off bridge Access to Means: Yes Specify Access to Suicidal Means: Can get to bridge What has been your use of drugs/alcohol within the last 12 months?: marijauana. Alcohol,Cocain Previous Attempts/Gestures: Yes How many times?: 6 Other Self Harm Risks: Cutting Triggers for Past Attempts: Other (Comment), Family contact(Father died) Intentional Self Injurious Behavior: Cutting Comment - Self Injurious Behavior: went to [place] for cutting Family Suicide History: Unknown Recent stressful life event(s): (none reported) Persecutory voices/beliefs?: No Depression: Yes Depression Symptoms: Insomnia, Feeling angry/irritable Substance abuse history and/or treatment for substance abuse?: Yes Suicide prevention information given to non-admitted patients: Not applicable  Risk to Others within the past 6 months Homicidal Ideation: No Does patient have any lifetime risk of violence toward others beyond the six months prior to admission? : No Thoughts of Harm to Others: No Current Homicidal Intent: No Current Homicidal Plan: No Access to Homicidal Means: No Identified Victim: n/a History of harm to others?: No Assessment of Violence: None Noted Violent Behavior Description: none Does patient have access to weapons?: No Criminal Charges Pending?: Yes Describe Pending Criminal Charges: Communicating threats and probation violation Does patient have a court date: Yes Court Date: 10/06/17 Is patient on probation?: Yes  Psychosis Hallucinations: None noted Delusions: None noted  Mental Status Report Appearance/Hygiene: In scrubs Eye Contact: Poor Motor Activity: Freedom of movement Speech: Logical/coherent Level of Consciousness: Alert Mood: Depressed Affect: Irritable(aggitated) Anxiety Level: Minimal Thought Processes: Coherent Judgement: Partial Orientation: Appropriate for developmental  age Obsessive Compulsive Thoughts/Behaviors: None  Cognitive Functioning Concentration: Normal Memory: Recent Intact IQ: Average Insight: Poor Impulse Control: Poor Appetite: Good Sleep: Decreased Vegetative Symptoms: None  ADLScreening [facility] Assessment [facility]) Patient's cognitive ability adequate to safely complete daily activities?: Yes Patient able to express need for assistance with ADLs?: Yes Independently performs ADLs?: Yes (appropriate for developmental age)  Prior Inpatient Therapy Prior Inpatient Therapy: Yes Prior Therapy Dates: 11/2016, 06/2016, 05/2016 & 07/2011 Prior Therapy Facilty/Provider(s): [facility], [facility] Reason for Treatment: SI and SA  Prior Outpatient Therapy Prior Outpatient Therapy: No Prior Therapy Dates: Reports of none Prior Therapy Facilty/Provider(s): Reports of none Reason for Treatment: Reports of none Does patient have an ACCT team?: No Does patient have Intensive In-House Services?  : No Does patient have [facility] services? : No Does patient have P4CC services?: No  ADL Screening (condition at time of admission) Patient's cognitive ability adequate to safely complete daily activities?: Yes Is the patient deaf or have difficulty hearing?: No Does the patient have difficulty seeing, even when wearing glasses/contacts?: No Does the patient have difficulty concentrating, remembering, or making decisions?: Yes(Sometimes) Patient able to express need for assistance with ADLs?: Yes Does the patient have difficulty dressing or bathing?: No Independently performs ADLs?: Yes (appropriate for developmental age) Does the patient have difficulty walking or climbing stairs?: No Weakness of Legs: Right Weakness of Arms/Hands: None  Home Assistive Devices/Equipment Home Assistive Devices/Equipment: Brace (specify type)  Therapy Consults (therapy consults require a physician order) PT Evaluation Needed: No OT Evalulation Needed: No SLP  Evaluation Needed: No       Advance  Directives (For Healthcare) Does Patient Have a Medical Advance Directive?: Yes Type of Advance Directive: Healthcare Power of Attorney Copy of Healthcare Power of Attorney in Chart?: No - copy requested Would patient like information on creating a medical advance directive?: No - Patient declined    Additional Information 1:1 In Past 12 Months?: No CIRT Risk: No Elopement Risk: No Does patient have medical clearance?: Yes     Disposition:  Disposition Initial Assessment Completed for this Encounter: Yes Disposition of Patient: Pending Review with psychiatrist  On Site Evaluation by:   Reviewed with Physician:    Phebe Colla, MSW, LCSW-A 09/04/2017 9:57 PM

## 2017-09-04 NOTE — ED Notes (Signed)
Report given to Dr Sprague from SOC.  

## 2017-09-04 NOTE — ED Notes (Signed)
Pt reports he has not been drinking for 3 days. Pt reports he is out of his medications except for his seizure medication. Pt asking for help. +nausea, denies headache.

## 2017-09-04 NOTE — ED Notes (Signed)
TTS in to assess pt.  

## 2017-09-04 NOTE — ED Provider Notes (Signed)
[facility] Emergency Department Provider Note  Time seen: 6:56 PM  I have reviewed the triage vital signs and the nursing notes.   HISTORY  Chief Complaint Psychiatric Evaluation    HPI Alex [name] is a 41 y.o. male with a past medical history of alcohol use, anxiety, cocaine abuse, depression, presents to the emergency department for psychiatric evaluation.  According to the patient he has been off of his medications besides his seizure medication for the past several weeks.  Patient states his last alcohol use was 3 days ago when he is having nausea.  Last used cocaine 2 days ago.  Patient states he has been off of his medications and he is having trouble sleeping, states he is not thinking right.  No SI or HI.   Past Medical History:  Diagnosis Date  . Alcohol abuse   . Anxiety   . Cocaine abuse (HCC) 05/27/2016  . Depression   . Seizures [facility](HCC)     Patient Active Problem List   Diagnosis Date Noted  . Substance induced mood disorder (HCC) 12/30/2016  . Moderate recurrent major depression (HCC) 12/30/2016  . Cocaine abuse (HCC)   . ADHD 08/11/2016  . Alcohol dependence with unspecified alcohol-induced disorder (HCC) 06/28/2016  . Alcohol withdrawal (HCC) 06/28/2016  . Cocaine use disorder, moderate, dependence (HCC) 06/28/2016  . Unspecified Depressive disorder 06/28/2016  . Tobacco use disorder 06/28/2016    Past Surgical History:  Procedure Laterality Date  . FINGER SURGERY      Prior to Admission medications   Medication Sig Start Date End Date Taking? Authorizing Provider  citalopram (CELEXA) 20 MG tablet TAKE TWO TABLETS BY MOUTH EVERY DAY Patient taking differently: TAKE 1 TABLET (20MG ) BY MOUTH DAILY 07/29/17   Clapacs, Jackquline DenmarkJohn T, MD  ibuprofen (ADVIL,MOTRIN) 800 MG tablet Take 1 tablet (800 mg total) by mouth every 8 (eight) hours as needed. 08/19/17   Nita SickleVeronese, [place], MD  Multiple Vitamin (MULTIVITAMIN) tablet Take 1 tablet by mouth  daily.    [provider]  omeprazole (PRILOSEC) 20 MG capsule Take 20 mg by mouth 2 (two) times daily before a meal.    [provider]  traZODone (DESYREL) 150 MG tablet Take 1 tablet (150 mg total) by mouth at bedtime. 12/30/16   Clapacs, Jackquline DenmarkJohn T, MD  valproic acid (DEPAKENE) 250 MG capsule TAKE THREE CAPSULES BY MOUTH AT BEDTIME 06/20/17   Clapacs, Jackquline DenmarkJohn T, MD    Allergies  Allergen Reactions  . Peanut Butter Flavor     No family history on file.  Social History Social History   Tobacco Use  . Smoking status: Current Every Day Smoker    Packs/day: 1.00    Types: Cigarettes  . Smokeless tobacco: Never Used  Substance Use Topics  . Alcohol use: Yes    Alcohol/week: 2.4 - 3.0 oz    Types: 4 - 5 Cans of beer per week    Comment: Pt states that last drink was 3-4 days ago  . Drug use: Yes    Types: Marijuana, Cocaine    Comment: cocaine last used on friday    Review of Systems Constitutional: Negative for fever. Cardiovascular: Negative for chest pain. Respiratory: Negative for shortness of breath. Gastrointestinal: Negative for abdominal pain.  Positive for nausea.  Positive diarrhea. Neurological: Negative for headache All other ROS negative  ____________________________________________   PHYSICAL EXAM:  Constitutional: Alert and oriented. Well appearing and in no distress. Eyes: Normal exam ENT   Head:  Normocephalic and atraumatic.   Mouth/Throat: Mucous membranes are moist. Cardiovascular: Normal rate, regular rhythm. No murmur Respiratory: Normal respiratory effort without tachypnea nor retractions. Breath sounds are clear  Gastrointestinal: Soft and nontender. No distention.  Musculoskeletal: Nontender with normal range of motion in all extremities.  Neurologic:  Normal speech and language. No gross focal neurologic deficits  Skin:  Skin is warm, dry and intact.  Psychiatric: Mood and affect are normal.  No SI or  HI.  ____________________________________________   INITIAL IMPRESSION / ASSESSMENT AND PLAN / ED COURSE  Pertinent labs & imaging results that were available during my care of the patient were reviewed by me and considered in my medical decision making (see chart for details).  Since to the emergency department for psychiatric evaluation.  We will check labs and consult psychiatry TTS.  Overall the patient appears very well, overall normal physical examination.  We will restart the patient's medications.  He does not meet IVC criteria.  Here voluntarily.  Medical workup is largely nonrevealing, besides cocaine and cannabinoid positive urine drug screen.  SOC has seen the patient and recommends admission.  ____________________________________________   FINAL CLINICAL IMPRESSION(S) / ED DIAGNOSES  Depression Substance abuse    Minna AntisPaduchowski, Takeem Krotzer, MD 09/04/17 [id number]

## 2017-09-04 NOTE — ED Notes (Signed)
[facility] RN in [facility] ED [facility]

## 2017-09-05 DIAGNOSIS — F331 Major depressive disorder, recurrent, moderate: Secondary | ICD-10-CM | POA: Diagnosis not present

## 2017-09-05 NOTE — ED Notes (Signed)
IVC/Pending placement 

## 2017-09-05 NOTE — ED Notes (Addendum)
Pt. Alert and oriented, warm and dry, in no distress. Pt. Denies SI, HI, and AVH. Pt. Encouraged to let nursing staff know of any concerns or needs. 

## 2017-09-05 NOTE — Consult Note (Signed)
[facility] Consult   Reason for Consult: Consult for 41 year old man with a history of depression and substance Referring Physician: [place] Patient Identification: Alex [name] MRN:  [id number] Principal Diagnosis: Moderate recurrent major depression ([place]) Diagnosis:   Patient Active Problem List   Diagnosis Date Noted  . Substance induced mood disorder ([place]) [F19.94] 12/30/2016  . Moderate recurrent major depression ([place]) [F33.1] 12/30/2016  . Cocaine abuse ([place]) [F14.10]   . ADHD [F90.9] 08/11/2016  . Alcohol dependence with unspecified alcohol-induced disorder ([place]) [F10.29] 06/28/2016  . Alcohol withdrawal ([place]) [F10.239] 06/28/2016  . Cocaine use disorder, moderate, dependence ([place]) [F14.20] 06/28/2016  . Unspecified Depressive disorder [F32.9] 06/28/2016  . Tobacco use disorder [F17.200] 06/28/2016    Total Time spent with patient: 1 hour  Subjective:   Alex [name] is a 41 y.o. male patient admitted with "I been out of my medicine".  HPI: Patient interviewed chart reviewed.  41 year old man came to the emergency room voluntarily.  He said his mood is been depressed down and anxious recently.  He has not been able to take his medicine for the last couple weeks because according to him [facility] is declining to see him anymore.  Patient minimizes his substance abuse particularly minimizing alcohol abuse but admits that he still has been smoking cocaine.  Mood is been depressed and anxious.  Denies hallucinations.  No acute suicidal intention or plan no homicidal ideation no psychotic symptoms.  Medical history: Patient says he has some acute pain from some injuries he has suffered.  No other ongoing medical problems outside of the mental health  Social history: Patient lives with his mother.  Does not work.  Minimal support most of the time.  No resources to speak of.  Substance abuse history: Multiple episodes of presentation with alcohol and cocaine abuse.  Has  had some participation in substance abuse programming but not consistently especially not outside the hospital  Past Psychiatric History: Patient has presented multiple times previously with suicidal threats almost always in the context of intoxication.  On admission to the hospital he usually immediately comes to a baseline without acute suicidal ideation.  Has a very simple and dependent behavior about him.  Has been treated with Depakote and Celexa with some possible benefit in the past.  Risk to Self: Suicidal Ideation: Yes-Currently Present Suicidal Intent: Yes-Currently Present Is patient at risk for suicide?: Yes Suicidal Plan?: Yes-Currently Present Specify Current Suicidal Plan: Jumping off bridge Access to Means: Yes Specify Access to Suicidal Means: Can get to bridge What has been your use of drugs/alcohol within the last 12 months?: marijauana. Alcohol,Cocain How many times?: 6 Other Self Harm Risks: Cutting Triggers for Past Attempts: Other (Comment), Family contact(Father died) Intentional Self Injurious Behavior: Cutting Comment - Self Injurious Behavior: went to [place] for cutting Risk to Others: Homicidal Ideation: No Thoughts of Harm to Others: No Current Homicidal Intent: No Current Homicidal Plan: No Access to Homicidal Means: No Identified Victim: n/a History of harm to others?: No Assessment of Violence: None Noted Violent Behavior Description: none Does patient have access to weapons?: No Criminal Charges Pending?: Yes Describe Pending Criminal Charges: Communicating threats and probation violation Does patient have a court date: Yes Court Date: 10/06/17 Prior Inpatient Therapy: Prior Inpatient Therapy: Yes Prior Therapy Dates: 11/2016, 06/2016, 05/2016 & 07/2011 Prior Therapy Facilty/Provider(s): [facility], [facility] Reason for Treatment: SI and SA Prior Outpatient Therapy: Prior Outpatient Therapy: No Prior Therapy Dates: Reports of none Prior Therapy  Facilty/Provider(s): Reports of none Reason for Treatment: Reports of none Does patient have an ACCT team?: No Does patient have Intensive In-House Services?  : No Does patient have [facility] services? : No Does patient have P4[facility] services?: No  Past Medical History:  Past Medical History:  Diagnosis Date  . Alcohol abuse   . Anxiety   . Cocaine abuse ([place]) 05/27/2016  . Depression   . Seizures ([place])     Past Surgical History:  Procedure Laterality Date  . FINGER SURGERY     Family History: No family history on file. Family Psychiatric  History: Positive for alcohol abuse Social History:  Social History   Substance and Sexual Activity  Alcohol Use Yes  . Alcohol/week: 2.4 - 3.0 oz  . Types: 4 - 5 Cans of beer per week   Comment: Pt states that last drink was 3-4 days ago     Social History   Substance and Sexual Activity  Drug Use Yes  . Types: Marijuana, Cocaine   Comment: cocaine last used on friday    Social History   Socioeconomic History  . Marital status: Single    Spouse name: None  . Number of children: None  . Years of education: None  . Highest education level: None  Social Needs  . Financial resource strain: None  . Food insecurity - worry: None  . Food insecurity - inability: None  . Transportation needs - medical: None  . Transportation needs - non-medical: None  Occupational History  . None  Tobacco Use  . Smoking status: Current Every Day Smoker    Packs/day: 1.00    Types: Cigarettes  . Smokeless tobacco: Never Used  Substance and Sexual Activity  . Alcohol use: Yes    Alcohol/week: 2.4 - 3.0 oz    Types: 4 - 5 Cans of beer per week    Comment: Pt states that last drink was 3-4 days ago  . Drug use: Yes    Types: Marijuana, Cocaine    Comment: cocaine last used on friday  . Sexual activity: None  Other Topics Concern  . None  Social History Narrative  . None   Additional Social History:    Allergies:   Allergies  Allergen  Reactions  . Peanut Butter Flavor     Labs:  Results for orders placed or performed during the hospital encounter of 09/04/17 (from the past 48 hour(s))  Comprehensive metabolic panel     Status: Abnormal   Collection Time: 09/04/17  6:47 PM  Result Value Ref Range   Sodium 132 (L) 135 - 145 mmol/L   Potassium 3.7 3.5 - 5.1 mmol/L   Chloride 99 (L) 101 - 111 mmol/L   CO2 22 22 - 32 mmol/L   Glucose, Bld 107 (H) 65 - 99 mg/dL   BUN 12 6 - 20 mg/dL   Creatinine, Ser 0.77 0.61 - 1.24 mg/dL   Calcium 9.3 8.9 - 10.3 mg/dL   Total Protein 7.6 6.5 - 8.1 g/dL   Albumin 3.9 3.5 - 5.0 g/dL   AST 104 (H) 15 - 41 U/L   ALT 96 (H) 17 - 63 U/L   Alkaline Phosphatase 87 38 - 126 U/L   Total Bilirubin 1.0 0.3 - 1.2 mg/dL   GFR calc non Af Amer >60 >60 mL/min   GFR calc Af Amer >60 >60 mL/min    Comment: (NOTE) The eGFR has been calculated using the CKD EPI equation. This calculation has not been validated  in all clinical situations. eGFR's persistently <60 mL/min signify possible Chronic Kidney Disease.    Anion gap 11 5 - 15  Ethanol     Status: None   Collection Time: 09/04/17  6:47 PM  Result Value Ref Range   Alcohol, Ethyl (B) <10 <10 mg/dL    Comment:        LOWEST DETECTABLE LIMIT FOR SERUM ALCOHOL IS 10 mg/dL FOR MEDICAL PURPOSES ONLY   cbc     Status: None   Collection Time: 09/04/17  6:47 PM  Result Value Ref Range   WBC 9.6 3.8 - 10.6 K/uL   RBC 4.73 4.40 - 5.90 MIL/uL   Hemoglobin 15.8 13.0 - 18.0 g/dL   HCT 45.9 40.0 - 52.0 %   MCV 97.2 80.0 - 100.0 fL   MCH 33.4 26.0 - 34.0 pg   MCHC 34.4 32.0 - 36.0 g/dL   RDW 13.4 11.5 - 14.5 %   Platelets 164 150 - 440 K/uL  Urine Drug Screen, Qualitative     Status: Abnormal   Collection Time: 09/04/17  6:47 PM  Result Value Ref Range   Tricyclic, Ur Screen NONE DETECTED NONE DETECTED   Amphetamines, Ur Screen NONE DETECTED NONE DETECTED   MDMA (Ecstasy)Ur Screen NONE DETECTED NONE DETECTED   Cocaine Metabolite,Ur [place]  POSITIVE (A) NONE DETECTED   Opiate, Ur Screen NONE DETECTED NONE DETECTED   Phencyclidine (PCP) Ur S NONE DETECTED NONE DETECTED   Cannabinoid 50 Ng, Ur  POSITIVE (A) NONE DETECTED   Barbiturates, Ur Screen NONE DETECTED NONE DETECTED   Benzodiazepine, Ur Scrn NONE DETECTED NONE DETECTED   Methadone Scn, Ur NONE DETECTED NONE DETECTED    Comment: (NOTE) 637  Tricyclics, urine               Cutoff 1000 ng/mL 200  Amphetamines, urine             Cutoff 1000 ng/mL 300  MDMA (Ecstasy), urine           Cutoff 500 ng/mL 400  Cocaine Metabolite, urine       Cutoff 300 ng/mL 500  Opiate, urine                   Cutoff 300 ng/mL 600  Phencyclidine (PCP), urine      Cutoff 25 ng/mL 700  Cannabinoid, urine              Cutoff 50 ng/mL 800  Barbiturates, urine             Cutoff 200 ng/mL 900  Benzodiazepine, urine           Cutoff 200 ng/mL 1000 Methadone, urine                Cutoff 300 ng/mL 1100 1200 The urine drug screen provides only a preliminary, unconfirmed 1300 analytical test result and should not be used for non-medical 1400 purposes. Clinical consideration and professional judgment should 1500 be applied to any positive drug screen result due to possible 1600 interfering substances. A more specific alternate chemical method 1700 must be used in order to obtain a confirmed analytical result.  1800 Gas chromato graphy / mass spectrometry (GC/MS) is the preferred 1900 confirmatory method.     Current Facility-Administered Medications  Medication Dose Route Frequency Provider Last Rate Last Dose  . citalopram (CELEXA) tablet 20 mg  20 mg Oral Daily Harvest Dark, MD   20 mg at 09/05/17 0958  . ibuprofen (ADVIL,MOTRIN)  tablet 600 mg  600 mg Oral Q6H PRN Harvest Dark, MD   600 mg at 09/05/17 1608  . multivitamin with minerals tablet 1 tablet  1 tablet Oral Daily Harvest Dark, MD   1 tablet at 09/05/17 [id number]  . pantoprazole (PROTONIX) EC tablet 40 mg  40 mg Oral Daily  Harvest Dark, MD   40 mg at 09/05/17 0958  . traZODone (DESYREL) tablet 150 mg  150 mg Oral QHS Harvest Dark, MD   150 mg at 09/04/17 2223  . valproic acid (DEPAKENE) 250 MG capsule 750 mg  750 mg Oral QHS Harvest Dark, MD   750 mg at 09/04/17 2224   Current Outpatient Medications  Medication Sig Dispense Refill  . citalopram (CELEXA) 20 MG tablet TAKE TWO TABLETS BY MOUTH EVERY DAY (Patient taking differently: TAKE 1 TABLET (20MG) BY MOUTH DAILY) 30 tablet 0  . ibuprofen (ADVIL,MOTRIN) 800 MG tablet Take 1 tablet (800 mg total) by mouth every 8 (eight) hours as needed. 30 tablet 0  . Multiple Vitamin (MULTIVITAMIN) tablet Take 1 tablet by mouth daily.    Marland Kitchen omeprazole (PRILOSEC) 20 MG capsule Take 20 mg by mouth 2 (two) times daily before a meal.    . traZODone (DESYREL) 150 MG tablet Take 1 tablet (150 mg total) by mouth at bedtime. 30 tablet 1  . valproic acid (DEPAKENE) 250 MG capsule TAKE THREE CAPSULES BY MOUTH AT BEDTIME 90 capsule 0    Musculoskeletal: Strength & Muscle Tone: within normal limits Gait & Station: normal Patient leans: N/A  Psychiatric Specialty Exam: Physical Exam  Nursing note and vitals reviewed. Constitutional: He appears well-developed and well-nourished.  HENT:  Head: Normocephalic and atraumatic.  Eyes: Conjunctivae are normal. Pupils are equal, round, and reactive to light.  Neck: Normal range of motion.  Cardiovascular: Regular rhythm and normal heart sounds.  Respiratory: Effort normal. No respiratory distress.  GI: Soft. He exhibits no distension.  Musculoskeletal: Normal range of motion.  Neurological: He is alert.  Skin: Skin is warm and dry.  Psychiatric: His speech is normal and behavior is normal. Thought content normal. His mood appears anxious. Cognition and memory are normal. He expresses impulsivity and inappropriate judgment. He exhibits a depressed mood.    Review of Systems  Constitutional: Negative.   HENT: Negative.    Eyes: Negative.   Respiratory: Negative.   Cardiovascular: Negative.   Gastrointestinal: Negative.   Musculoskeletal: Negative.   Skin: Negative.   Neurological: Negative.   Psychiatric/Behavioral: Positive for depression, memory loss and substance abuse. Negative for hallucinations and suicidal ideas. The patient is nervous/anxious. The patient does not have insomnia.     Blood pressure 140/82, pulse 74, temperature (!) 97.3 F (36.3 C), temperature source Oral, resp. rate 16, SpO2 95 %.There is no height or weight on file to calculate BMI.  General Appearance: Disheveled  Eye Contact:  Good  Speech:  Clear and Coherent  Volume:  Normal  Mood:  Depressed  Affect:  Constricted  Thought Process:  Goal Directed  Orientation:  NA  Thought Content:  Logical  Suicidal Thoughts:  No  Homicidal Thoughts:  No  Memory:  Immediate;   Fair Recent;   Fair Remote;   Fair  Judgement:  Fair  Insight:  Fair  Psychomotor Activity:  Decreased  Concentration:  Concentration: Fair  Recall:  [employer] of Knowledge:  Fair  Language:  Fair  Akathisia:  No  Handed:  Right  AIMS (if indicated):  Assets:  Desire for Improvement Housing Resilience Social Support  ADL's:  Intact  Cognition:  Impaired,  Mild  Sleep:        Treatment Plan Summary: Medication management and Plan 41 year old man with a history of substance abuse personality disorder cognitive impairment and mood disorder.  Currently patient presents at his baseline.  No acute suicidal intention or plan.  No sign of psychosis.  Patient not likely to benefit from inpatient treatment.  Does not meet commitment criteria.  Patient not under IVC.  Agree with plan of continuing his Depakote and Celexa but otherwise he can be released to outpatient treatment in the community.  Case reviewed with emergency room physician and TTS.  Disposition: Patient does not meet criteria for psychiatric inpatient admission. Supportive therapy  provided about ongoing stressors.  Alethia Berthold, MD 09/05/2017 4:49 PM

## 2017-09-05 NOTE — ED Notes (Signed)
VOL/Pending placement 

## 2017-09-05 NOTE — ED Notes (Signed)
Patient refused lunch tray at this time, but patient did eat half of His breakfast later in the morning and had beverage.

## 2017-09-05 NOTE — ED Notes (Signed)
Patient sleeping, no signs of distress, will continue to monitor.  

## 2017-09-05 NOTE — ED Notes (Signed)
Patient took medication without difficulty, went to bathroom, nurse will continue to monitor, q 15 minute checks and camera surveillance in progress for safety.

## 2017-09-05 NOTE — ED Notes (Signed)
Patient keeps coming to nursing station, wants prescriptions and nurse explained that he would only get prescriptions at the time of discharge, and Patient still continues to ask same questions over and over, Dr. Toni Amendlapacs states that He will talk to Patient. Patient is safe. q 15 minute checks and camera surveillance in progress for safety.

## 2017-09-05 NOTE — ED Notes (Signed)
Patient up to bathroom, no signs of distress, patient refused breakfast at this time, will continue to monitor.

## 2017-09-05 NOTE — ED Notes (Signed)
Pt c/o right ankle pain. Pt s/p right ankle sprain early nov. Pt given ibuprofen.

## 2017-09-06 MED ORDER — VALPROIC ACID 250 MG PO CAPS: 750.0000 mg | ORAL_CAPSULE | Freq: Every day | ORAL | 1 refills | Status: DC

## 2017-09-06 MED ORDER — CITALOPRAM HYDROBROMIDE 20 MG PO TABS: 20.0000 mg | ORAL_TABLET | Freq: Every day | ORAL | 1 refills | Status: DC

## 2017-09-06 NOTE — Consult Note (Signed)
[facility] Face-to-Face Psychiatry Consult   Reason for Consult: Consult follow-up 41 year old man with mood and substance abuse problems Referring Physician: Reita Cliche Patient Identification: Alex [name] MRN:  [id number] Principal Diagnosis: Moderate recurrent major depression ([place]) Diagnosis:   Patient Active Problem List   Diagnosis Date Noted  . Substance induced mood disorder ([place]) [F19.94] 12/30/2016  . Moderate recurrent major depression ([place]) [F33.1] 12/30/2016  . Cocaine abuse ([place]) [F14.10]   . ADHD [F90.9] 08/11/2016  . Alcohol dependence with unspecified alcohol-induced disorder ([place]) [F10.29] 06/28/2016  . Alcohol withdrawal ([place]) [F10.239] 06/28/2016  . Cocaine use disorder, moderate, dependence ([place]) [F14.20] 06/28/2016  . Unspecified Depressive disorder [F32.9] 06/28/2016  . Tobacco use disorder [F17.200] 06/28/2016    Total Time spent with patient: 30 minutes  Subjective:   Alex [name] is a 41 y.o. male patient admitted with "when can I go".  HPI: Patient interviewed see previous notes.  41 year old man with substance abuse and mood problems came into the emergency room over the weekend.  Has not shown any dangerous behavior since then.  Affect is now at baseline.  No acute suicidal ideation no evidence of psychosis.  Patient is compliant with medication.  He does not meet criteria for inpatient treatment.  No sign of acute dangerousness  Past Psychiatric History: Multiple prior emergency room visits under similar circumstances although in the past he has made more suicidal threats which she currently is denying.  He has been referred to outpatient treatment but has a history of noncompliance  Risk to Self: Suicidal Ideation: Yes-Currently Present Suicidal Intent: Yes-Currently Present Is patient at risk for suicide?: Yes Suicidal Plan?: Yes-Currently Present Specify Current Suicidal Plan: Jumping off bridge Access to Means: Yes Specify Access to Suicidal Means: Can get  to bridge What has been your use of drugs/alcohol within the last 12 months?: marijauana. Alcohol,Cocain How many times?: 6 Other Self Harm Risks: Cutting Triggers for Past Attempts: Other (Comment), Family contact(Father died) Intentional Self Injurious Behavior: Cutting Comment - Self Injurious Behavior: went to [place] for cutting Risk to Others: Homicidal Ideation: No Thoughts of Harm to Others: No Current Homicidal Intent: No Current Homicidal Plan: No Access to Homicidal Means: No Identified Victim: n/a History of harm to others?: No Assessment of Violence: None Noted Violent Behavior Description: none Does patient have access to weapons?: No Criminal Charges Pending?: Yes Describe Pending Criminal Charges: Communicating threats and probation violation Does patient have a court date: Yes Court Date: 10/06/17 Prior Inpatient Therapy: Prior Inpatient Therapy: Yes Prior Therapy Dates: 11/2016, 06/2016, 05/2016 & 07/2011 Prior Therapy Facilty/Provider(s): [facility], [facility] Reason for Treatment: SI and SA Prior Outpatient Therapy: Prior Outpatient Therapy: No Prior Therapy Dates: Reports of none Prior Therapy Facilty/Provider(s): Reports of none Reason for Treatment: Reports of none Does patient have an ACCT team?: No Does patient have Intensive In-House Services?  : No Does patient have [facility] services? : No Does patient have P4CC services?: No  Past Medical History:  Past Medical History:  Diagnosis Date  . Alcohol abuse   . Anxiety   . Cocaine abuse ([place]) 05/27/2016  . Depression   . Seizures ([place])     Past Surgical History:  Procedure Laterality Date  . FINGER SURGERY     Family History: No family history on file. Family Psychiatric  History: Positive for alcohol abuse Social History:  Social History   Substance and Sexual Activity  Alcohol Use Yes  . Alcohol/week: 2.4 - 3.0 oz  . Types: 4 -  5 Cans of beer per week   Comment: Pt states that last drink  was 3-4 days ago     Social History   Substance and Sexual Activity  Drug Use Yes  . Types: Marijuana, Cocaine   Comment: cocaine last used on friday    Social History   Socioeconomic History  . Marital status: Single    Spouse name: None  . Number of children: None  . Years of education: None  . Highest education level: None  Social Needs  . Financial resource strain: None  . Food insecurity - worry: None  . Food insecurity - inability: None  . Transportation needs - medical: None  . Transportation needs - non-medical: None  Occupational History  . None  Tobacco Use  . Smoking status: Current Every Day Smoker    Packs/day: 1.00    Types: Cigarettes  . Smokeless tobacco: Never Used  Substance and Sexual Activity  . Alcohol use: Yes    Alcohol/week: 2.4 - 3.0 oz    Types: 4 - 5 Cans of beer per week    Comment: Pt states that last drink was 3-4 days ago  . Drug use: Yes    Types: Marijuana, Cocaine    Comment: cocaine last used on friday  . Sexual activity: None  Other Topics Concern  . None  Social History Narrative  . None   Additional Social History:    Allergies:   Allergies  Allergen Reactions  . Peanut Butter Flavor     Labs:  Results for orders placed or performed during the hospital encounter of 09/04/17 (from the past 48 hour(s))  Comprehensive metabolic panel     Status: Abnormal   Collection Time: 09/04/17  6:47 PM  Result Value Ref Range   Sodium 132 (L) 135 - 145 mmol/L   Potassium 3.7 3.5 - 5.1 mmol/L   Chloride 99 (L) 101 - 111 mmol/L   CO2 22 22 - 32 mmol/L   Glucose, Bld 107 (H) 65 - 99 mg/dL   BUN 12 6 - 20 mg/dL   Creatinine, Ser 0.77 0.61 - 1.24 mg/dL   Calcium 9.3 8.9 - 10.3 mg/dL   Total Protein 7.6 6.5 - 8.1 g/dL   Albumin 3.9 3.5 - 5.0 g/dL   AST 104 (H) 15 - 41 U/L   ALT 96 (H) 17 - 63 U/L   Alkaline Phosphatase 87 38 - 126 U/L   Total Bilirubin 1.0 0.3 - 1.2 mg/dL   GFR calc non Af Amer >60 >60 mL/min   GFR calc Af Amer  >60 >60 mL/min    Comment: (NOTE) The eGFR has been calculated using the CKD EPI equation. This calculation has not been validated in all clinical situations. eGFR's persistently <60 mL/min signify possible Chronic Kidney Disease.    Anion gap 11 5 - 15  Ethanol     Status: None   Collection Time: 09/04/17  6:47 PM  Result Value Ref Range   Alcohol, Ethyl (B) <10 <10 mg/dL    Comment:        LOWEST DETECTABLE LIMIT FOR SERUM ALCOHOL IS 10 mg/dL FOR MEDICAL PURPOSES ONLY   cbc     Status: None   Collection Time: 09/04/17  6:47 PM  Result Value Ref Range   WBC 9.6 3.8 - 10.6 K/uL   RBC 4.73 4.40 - 5.90 MIL/uL   Hemoglobin 15.8 13.0 - 18.0 g/dL   HCT 45.9 40.0 - 52.0 %   MCV 97.2  80.0 - 100.0 fL   MCH 33.4 26.0 - 34.0 pg   MCHC 34.4 32.0 - 36.0 g/dL   RDW 13.4 11.5 - 14.5 %   Platelets 164 150 - 440 K/uL  Urine Drug Screen, Qualitative     Status: Abnormal   Collection Time: 09/04/17  6:47 PM  Result Value Ref Range   Tricyclic, Ur Screen NONE DETECTED NONE DETECTED   Amphetamines, Ur Screen NONE DETECTED NONE DETECTED   MDMA (Ecstasy)Ur Screen NONE DETECTED NONE DETECTED   Cocaine Metabolite,Ur [place] POSITIVE (A) NONE DETECTED   Opiate, Ur Screen NONE DETECTED NONE DETECTED   Phencyclidine (PCP) Ur S NONE DETECTED NONE DETECTED   Cannabinoid 50 Ng, Ur [place] POSITIVE (A) NONE DETECTED   Barbiturates, Ur Screen NONE DETECTED NONE DETECTED   Benzodiazepine, Ur Scrn NONE DETECTED NONE DETECTED   Methadone Scn, Ur NONE DETECTED NONE DETECTED    Comment: (NOTE) 179  Tricyclics, urine               Cutoff 1000 ng/mL 200  Amphetamines, urine             Cutoff 1000 ng/mL 300  MDMA (Ecstasy), urine           Cutoff 500 ng/mL 400  Cocaine Metabolite, urine       Cutoff 300 ng/mL 500  Opiate, urine                   Cutoff 300 ng/mL 600  Phencyclidine (PCP), urine      Cutoff 25 ng/mL 700  Cannabinoid, urine              Cutoff 50 ng/mL 800  Barbiturates, urine             Cutoff 200  ng/mL 900  Benzodiazepine, urine           Cutoff 200 ng/mL 1000 Methadone, urine                Cutoff 300 ng/mL 1100 1200 The urine drug screen provides only a preliminary, unconfirmed 1300 analytical test result and should not be used for non-medical 1400 purposes. Clinical consideration and professional judgment should 1500 be applied to any positive drug screen result due to possible 1600 interfering substances. A more specific alternate chemical method 1700 must be used in order to obtain a confirmed analytical result.  1800 Gas chromato graphy / mass spectrometry (GC/MS) is the preferred 1900 confirmatory method.     Current Facility-Administered Medications  Medication Dose Route Frequency Provider Last Rate Last Dose  . citalopram (CELEXA) tablet 20 mg  20 mg Oral Daily Harvest Dark, MD   20 mg at 09/06/17 0919  . ibuprofen (ADVIL,MOTRIN) tablet 600 mg  600 mg Oral Q6H PRN Harvest Dark, MD   600 mg at 09/06/17 0923  . multivitamin with minerals tablet 1 tablet  1 tablet Oral Daily Harvest Dark, MD   1 tablet at 09/06/17 0919  . pantoprazole (PROTONIX) EC tablet 40 mg  40 mg Oral Daily Harvest Dark, MD   40 mg at 09/06/17 0919  . traZODone (DESYREL) tablet 150 mg  150 mg Oral QHS Harvest Dark, MD   150 mg at 09/05/17 2209  . valproic acid (DEPAKENE) 250 MG capsule 750 mg  750 mg Oral QHS Harvest Dark, MD   750 mg at 09/05/17 2209   Current Outpatient Medications  Medication Sig Dispense Refill  . [START ON 09/07/2017] citalopram (CELEXA) 20 MG tablet Take  1 tablet (20 mg total) by mouth daily. 30 tablet 1  . ibuprofen (ADVIL,MOTRIN) 800 MG tablet Take 1 tablet (800 mg total) by mouth every 8 (eight) hours as needed. 30 tablet 0  . Multiple Vitamin (MULTIVITAMIN) tablet Take 1 tablet by mouth daily.    Marland Kitchen omeprazole (PRILOSEC) 20 MG capsule Take 20 mg by mouth 2 (two) times daily before a meal.    . traZODone (DESYREL) 150 MG tablet Take 1  tablet (150 mg total) by mouth at bedtime. 30 tablet 1  . valproic acid (DEPAKENE) 250 MG capsule Take 3 capsules (750 mg total) by mouth at bedtime. 90 capsule 1    Musculoskeletal: Strength & Muscle Tone: within normal limits Gait & Station: normal Patient leans: N/A  Psychiatric Specialty Exam: Physical Exam  Nursing note and vitals reviewed. Constitutional: He appears well-developed and well-nourished.  HENT:  Head: Normocephalic and atraumatic.  Eyes: Conjunctivae are normal. Pupils are equal, round, and reactive to light.  Neck: Normal range of motion.  Cardiovascular: Regular rhythm and normal heart sounds.  Respiratory: Effort normal. No respiratory distress.  GI: Soft.  Musculoskeletal: Normal range of motion.  Neurological: He is alert.  Skin: Skin is warm and dry.  Psychiatric: He has a normal mood and affect. His behavior is normal. Judgment and thought content normal.    Review of Systems  Constitutional: Negative.   HENT: Negative.   Eyes: Negative.   Respiratory: Negative.   Cardiovascular: Negative.   Gastrointestinal: Negative.   Musculoskeletal: Negative.   Skin: Negative.   Neurological: Negative.   Psychiatric/Behavioral: Negative.     Blood pressure 122/79, pulse 67, temperature 98 F (36.7 C), temperature source Oral, resp. rate 16, SpO2 100 %.There is no height or weight on file to calculate BMI.  General Appearance: Casual  Eye Contact:  Good  Speech:  Clear and Coherent  Volume:  Normal  Mood:  Euthymic  Affect:  Congruent  Thought Process:  Goal Directed  Orientation:  Full (Time, Place, and Person)  Thought Content:  Logical  Suicidal Thoughts:  No  Homicidal Thoughts:  No  Memory:  Immediate;   Fair Recent;   Fair Remote;   Fair  Judgement:  Fair  Insight:  Fair  Psychomotor Activity:  Decreased  Concentration:  Concentration: Fair  Recall:  [employer] of Knowledge:  Fair  Language:  Fair  Akathisia:  No  Handed:  Right  AIMS  (if indicated):     Assets:  Desire for Improvement  ADL's:  Intact  Cognition:  WNL  Sleep:        Treatment Plan Summary: Plan Patient has been for the most part calm or at least at his baseline level of intrusiveness.  No evidence psychosis.  No suicidal statements or behavior.  Patient is ready to be discharged today.  Orders should be completed and new prescriptions are written.  He is to follow-up with [facility] in the community.  Patient has been counseled to stay away from alcohol and drugs.  Disposition: Patient does not meet criteria for psychiatric inpatient admission. Supportive therapy provided about ongoing stressors.  Alethia Berthold, MD 09/06/2017 1:54 PM

## 2017-09-06 NOTE — ED Notes (Signed)

## 2017-09-06 NOTE — Discharge Instructions (Signed)
Return to the emergency department immediately for any worsening condition including worsening depression, thoughts of wanting to hurt yourself or others, or any other symptoms concerning to you.

## 2017-09-06 NOTE — ED Provider Notes (Signed)
[facility]  I accepted care from Dr. Dolores FrameSung overnight ____________________________________________    LABS (pertinent positives/negatives)  I, Governor Rooksebecca Neely Kammerer, MD have personally reviewed the lab reports noted below.  Labs Reviewed  COMPREHENSIVE METABOLIC PANEL - Abnormal; Notable for the following components:      Result Value   Sodium 132 (*)    Chloride 99 (*)    Glucose, Bld 107 (*)    AST 104 (*)    ALT 96 (*)    All other components within normal limits  URINE DRUG SCREEN, QUALITATIVE ([facility] ONLY) - Abnormal; Notable for the following components:   Cocaine Metabolite,Ur [place] POSITIVE (*)    Cannabinoid 50 Ng, Ur [place] POSITIVE (*)    All other components within normal limits  ETHANOL  CBC     ____________________________________________    RADIOLOGY All xrays were viewed by me. Imaging interpreted by radiologist.  I, Governor Rooksebecca Nikky Duba MD have personally reviewed the imaging report noted below.  None  ____________________________________________   PROCEDURES  Procedure(s) performed: None  Critical Care performed: None  ____________________________________________   INITIAL IMPRESSION / ASSESSMENT AND PLAN / ED COURSE   Pertinent labs & imaging results that were available during my care of the patient were reviewed by me and considered in my medical decision making (see chart for details).   I took a phone call from Dr. Toni Amendlapacs with psychiatry who is recommending patient may be discharged home [facility].    CONSULTATIONS: Dr. Toni Amendlapacs, psychiatry, spoke with him by phone who is recommending discharge home with [facility] follow-up.    Patient / Family / Caregiver informed of clinical course, medical decision-making process, and agree with plan.   I discussed return precautions, follow-up instructions, and discharged instructions with patient and/or family.   Discharge instructions:  Return to the emergency  department immediately for any worsening condition including worsening depression, thoughts of wanting to hurt yourself or others, or any other symptoms concerning to you.   ____________________________________________   FINAL CLINICAL IMPRESSION(S) / ED DIAGNOSES  Final diagnoses:  Depression, unspecified depression type  Polysubstance abuse (HCC)        Governor RooksLord, Nazario Russom, MD 09/06/17 1353

## 2017-09-06 NOTE — ED Notes (Signed)
Discharge instructions reviewed with patient.Verbalized understanding.Patient denies SI,HI and AVH.Belongings returned to patient.Discharge to lobby by staff.

## 2017-09-06 NOTE — ED Provider Notes (Signed)
-----------------------------------------   6:29 AM on 09/06/2017 -----------------------------------------   Blood pressure 132/87, pulse 71, temperature 98.5 F (36.9 C), temperature source Oral, resp. rate 16, SpO2 99 %.  The patient had no acute events since last update.  Sleeping at this time.  Disposition is pending Psychiatry/Behavioral Medicine team recommendations.     Irean HongSung, Jade J, MD 09/06/17 [phone]

## 2017-09-07 ENCOUNTER — Emergency Department
Admission: EM | Admit: 2017-09-07 | Discharge: 2017-09-07 | Disposition: A | Discharge status: Home / Self Care | Payer: Medicaid Other | Source: Home / Self Care | Attending: Emergency Medicine | Admitting: Emergency Medicine

## 2017-09-07 ENCOUNTER — Other Ambulatory Visit: Payer: Self-pay

## 2017-09-07 DIAGNOSIS — F1910 Other psychoactive substance abuse, uncomplicated: Secondary | ICD-10-CM

## 2017-09-07 DIAGNOSIS — F1010 Alcohol abuse, uncomplicated: Secondary | ICD-10-CM

## 2017-09-07 DIAGNOSIS — Z765 Malingerer [conscious simulation]: Secondary | ICD-10-CM

## 2017-09-07 NOTE — ED Notes (Signed)
Pt asleep. No needs expressed at this time.

## 2017-09-07 NOTE — Discharge Instructions (Signed)
Please make sure you establish care with a psychiatrist today for further outpatient management.  Return to the emergency department sooner for any concerns whatsoever.  It was a pleasure to take care of you today, and thank you for coming to our emergency department.  If you have any questions or concerns before leaving please ask the nurse to grab me and I'm more than happy to go through your aftercare instructions again.  If you were prescribed any opioid pain medication today such as Norco, Vicodin, Percocet, morphine, hydrocodone, or oxycodone please make sure you do not drive when you are taking this medication as it can alter your ability to drive safely.  If you have any concerns once you are home that you are not improving or are in fact getting worse before you can make it to your follow-up appointment, please do not hesitate to call 911 and come back for further evaluation.  Merrily BrittleNeil Sharlee Rufino, MD  Results for orders placed or performed during the hospital encounter of 09/04/17  Comprehensive metabolic panel  Result Value Ref Range   Sodium 132 (L) 135 - 145 mmol/L   Potassium 3.7 3.5 - 5.1 mmol/L   Chloride 99 (L) 101 - 111 mmol/L   CO2 22 22 - 32 mmol/L   Glucose, Bld 107 (H) 65 - 99 mg/dL   BUN 12 6 - 20 mg/dL   Creatinine, Ser 1.470.77 0.61 - 1.24 mg/dL   Calcium 9.3 8.9 - 82.910.3 mg/dL   Total Protein 7.6 6.5 - 8.1 g/dL   Albumin 3.9 3.5 - 5.0 g/dL   AST 562104 (H) 15 - 41 U/L   ALT 96 (H) 17 - 63 U/L   Alkaline Phosphatase 87 38 - 126 U/L   Total Bilirubin 1.0 0.3 - 1.2 mg/dL   GFR calc non Af Amer >60 >60 mL/min   GFR calc Af Amer >60 >60 mL/min   Anion gap 11 5 - 15  Ethanol  Result Value Ref Range   Alcohol, Ethyl (B) <10 <10 mg/dL  cbc  Result Value Ref Range   WBC 9.6 3.8 - 10.6 K/uL   RBC 4.73 4.40 - 5.90 MIL/uL   Hemoglobin 15.8 13.0 - 18.0 g/dL   HCT 13.045.9 86.540.0 - 78.452.0 %   MCV 97.2 80.0 - 100.0 fL   MCH 33.4 26.0 - 34.0 pg   MCHC 34.4 32.0 - 36.0 g/dL   RDW 69.613.4  29.511.5 - 28.414.5 %   Platelets 164 150 - 440 K/uL  Urine Drug Screen, Qualitative  Result Value Ref Range   Tricyclic, Ur Screen NONE DETECTED NONE DETECTED   Amphetamines, Ur Screen NONE DETECTED NONE DETECTED   MDMA (Ecstasy)Ur Screen NONE DETECTED NONE DETECTED   Cocaine Metabolite,Ur [place] POSITIVE (A) NONE DETECTED   Opiate, Ur Screen NONE DETECTED NONE DETECTED   Phencyclidine (PCP) Ur S NONE DETECTED NONE DETECTED   Cannabinoid 50 Ng, Ur [place] POSITIVE (A) NONE DETECTED   Barbiturates, Ur Screen NONE DETECTED NONE DETECTED   Benzodiazepine, Ur Scrn NONE DETECTED NONE DETECTED   Methadone Scn, Ur NONE DETECTED NONE DETECTED   Dg Tibia/fibula Right  Result Date: 08/19/2017 CLINICAL DATA:  Right lower leg pain after a fall today. EXAM: RIGHT TIBIA AND FIBULA - 2 VIEW COMPARISON:  None. FINDINGS: There is no evidence of fracture or other focal bone lesions. Soft tissues are unremarkable. IMPRESSION: No acute bony abnormalities involving the right tibia or fibula. Electronically Signed   By: Marisa CyphersWilliam  Stevens M.D.  On: 08/19/2017 23:27   Dg Ankle Complete Right  Result Date: 08/19/2017 CLINICAL DATA:  Right ankle pain after a fall. EXAM: RIGHT ANKLE - COMPLETE 3+ VIEW COMPARISON:  None. FINDINGS: Soft tissue swelling about the right ankle most prominent laterally. Degenerative changes in the right ankle. 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 No evidence of acute fracture or dislocation. No focal bone lesion or bone destruction. Prominent plantar calcaneal spur. IMPRESSION: Soft tissue swelling. No evidence of acute bony abnormality. 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 Electronically Signed   By: Burman NievesWilliam  Stevens M.D.   On: 08/19/2017 23:24   Dg Knee Complete 4 Views Right  Result Date: 08/19/2017 CLINICAL DATA:  Right knee pain after a fall. EXAM: RIGHT KNEE - COMPLETE 4+ VIEW COMPARISON:  None. FINDINGS: No evidence of  fracture, dislocation, or joint effusion. No evidence of arthropathy or other focal bone abnormality. Soft tissues are unremarkable. IMPRESSION: Negative. Electronically Signed   By: Burman NievesWilliam  Stevens M.D.   On: 08/19/2017 23:26

## 2017-09-07 NOTE — ED Notes (Signed)
Pt walks from bed to bathroom independently with a steady gait. Pt mumbles and stomps back to bed. Pt given third warm blanket.

## 2017-09-07 NOTE — ED Notes (Signed)
Pt calm and cooperative at this time. Pt requests admission. Charge RN & MD spoke with pt.

## 2017-09-07 NOTE — ED Notes (Signed)
Attempted to call legal guardian x2 with no answer.  

## 2017-09-07 NOTE — ED Notes (Signed)
Pt states he wants to be admitted to hospital. Pt told by MD he could stay here till the morning but he would not be admitted.

## 2017-09-07 NOTE — ED Notes (Signed)
Pt mother and legal guardian number listed is [phone] is no longer in service.

## 2017-09-07 NOTE — ED Notes (Addendum)
No protocols to be done per Dr. Lamont Snowballifenbark. Pt not changed out per Dr. Rutha Bouchardiferbark and charge nurse, [place] [facility] [place] taken back to room.

## 2017-09-07 NOTE — ED Triage Notes (Signed)
Pt brought in voluntarily by police for suicidal ideation, pt discharged today from here.

## 2017-09-07 NOTE — ED Notes (Signed)
Pt given blanket and pillow.

## 2017-09-07 NOTE — ED Notes (Signed)
Updated plan of care. Pt agrees to stay in 1hall bed until he can ride the bus.

## 2017-09-07 NOTE — ED Provider Notes (Signed)
[facility] Emergency Department Provider Note  ____________________________________________   First MD Initiated Contact with Patient 09/07/17 0023     (approximate)  I have reviewed the triage vital signs and the nursing notes.   HISTORY  Chief Complaint Suicidal    HPI Alex [name] is a 41 y.o. male who presents to the emergency department requesting psychiatric admission.  He was in our emergency department yesterday after a 2-day stay and was evaluated twice by Dr. Toni Amendlapacs our psychiatrist most recently 10 hours prior to his return.  He says that after he got home he drank 3 beers and smoked crack cocaine and then took a dull knife and cut his right wrist.  He then flagged down a police officer to bring him to the hospital.  He says he does not have a psychiatrist and he wants to be evaluated again today.  His symptoms are chronic have been insidious in onset.  They seem to be worsened by interpersonal conflict and improved somewhat with rest.  He has a family history of diabetes mellitus.    Past Medical History:  Diagnosis Date  . Alcohol abuse   . Anxiety   . Cocaine abuse (HCC) 05/27/2016  . Depression   . Seizures [facility](HCC)     Patient Active Problem List   Diagnosis Date Noted  . Substance induced mood disorder (HCC) 12/30/2016  . Moderate recurrent major depression (HCC) 12/30/2016  . Cocaine abuse (HCC)   . ADHD 08/11/2016  . Alcohol dependence with unspecified alcohol-induced disorder (HCC) 06/28/2016  . Alcohol withdrawal (HCC) 06/28/2016  . Cocaine use disorder, moderate, dependence (HCC) 06/28/2016  . Unspecified Depressive disorder 06/28/2016  . Tobacco use disorder 06/28/2016    Past Surgical History:  Procedure Laterality Date  . FINGER SURGERY      Prior to Admission medications   Medication Sig Start Date End Date Taking? Authorizing Provider  citalopram (CELEXA) 20 MG tablet Take 1 tablet (20 mg total) by mouth daily.  09/07/17   Clapacs, Jackquline DenmarkJohn T, MD  ibuprofen (ADVIL,MOTRIN) 800 MG tablet Take 1 tablet (800 mg total) by mouth every 8 (eight) hours as needed. 08/19/17   Nita SickleVeronese, Butler, MD  Multiple Vitamin (MULTIVITAMIN) tablet Take 1 tablet by mouth daily.    [provider]  omeprazole (PRILOSEC) 20 MG capsule Take 20 mg by mouth 2 (two) times daily before a meal.    [provider]  traZODone (DESYREL) 150 MG tablet Take 1 tablet (150 mg total) by mouth at bedtime. 12/30/16   Clapacs, Jackquline DenmarkJohn T, MD  valproic acid (DEPAKENE) 250 MG capsule Take 3 capsules (750 mg total) by mouth at bedtime. 09/06/17   Clapacs, Jackquline DenmarkJohn T, MD    Allergies Peanut butter flavor  No family history on file.  Social History Social History   Tobacco Use  . Smoking status: Current Every Day Smoker    Packs/day: 1.00    Types: Cigarettes  . Smokeless tobacco: Never Used  Substance Use Topics  . Alcohol use: Yes    Alcohol/week: 2.4 - 3.0 oz    Types: 4 - 5 Cans of beer per week    Comment: Pt states that last drink was 3-4 days ago  . Drug use: Yes    Types: Marijuana, Cocaine    Comment: cocaine last used on friday    Review of Systems Constitutional: No fever/chills Eyes: No visual changes. ENT: No sore throat. Cardiovascular: Denies chest pain. Respiratory: Denies shortness of breath. Gastrointestinal:  No abdominal pain.  No nausea, no vomiting.  No diarrhea.  No constipation. Genitourinary: Negative for dysuria. Musculoskeletal: Negative for back pain. Skin: Negative for rash. Neurological: Negative for headaches, focal weakness or numbness.   ____________________________________________   PHYSICAL EXAM:  VITAL SIGNS: ED Triage Vitals  Enc Vitals Group     BP 09/07/17 0002 119/87     Pulse Rate 09/07/17 0002 (!) 101     Resp 09/07/17 0002 20     Temp 09/07/17 0002 98.2 F (36.8 C)     Temp Source 09/07/17 0002 Oral     SpO2 09/07/17 0002 100 %     Weight 09/07/17 0002 210 lb (95.3  kg)     Height 09/07/17 0002 5\' 7"  (1.702 m)     Head Circumference --      Peak Flow --      Pain Score 09/07/17 0001 8     Pain Loc --      Pain Edu? --      Excl. in GC? --     Constitutional: Alert and oriented x4 speech but overall quite well-appearing no diaphoresis speaks in full clear sentences Eyes: PERRL EOMI. mid range and brisk Head: Atraumatic. Nose: No congestion/rhinnorhea. Mouth/Throat: No trismus Neck: No stridor.   Cardiovascular: Tachycardic rate, regular rhythm. Grossly normal heart sounds.  Good peripheral circulation. Respiratory: Normal respiratory effort.  No retractions. Lungs CTAB and moving good air Gastrointestinal: Soft nontender Musculoskeletal: No lower extremity edema   Neurologic:  Normal speech and language. No gross focal neurologic deficits are appreciated. Skin: 4 thin horizontal linear superficial abrasions across right wrist zone 5 Psychiatric: Mildly slurred speech    ____________________________________________   DIFFERENTIAL includes but not limited to  Alcohol intoxication, cocaine intoxication, suicidal ideation, malingering ____________________________________________   LABS (all labs ordered are listed, but only abnormal results are displayed)  Labs Reviewed - No data to display   __________________________________________  EKG   ____________________________________________  RADIOLOGY   ____________________________________________   PROCEDURES  Procedure(s) performed: no  Procedures  Critical Care performed: no  Observation: yes  ----------------------------------------- 12:31 AM on 09/07/2017 -----------------------------------------   OBSERVATION CARE: This patient is being placed under observation care for the following reasons: Questionable overdose observed to r/o significant toxicity     ____________________________________________   INITIAL IMPRESSION / ASSESSMENT AND PLAN / ED  COURSE  Pertinent labs & imaging results that were available during my care of the patient were reviewed by me and considered in my medical decision making (see chart for details).  On arrival the patient is somewhat intoxicated and is expressing suicidal gestures he is perseverating on having a several day stay in the hospital.  He has been recently evaluated twice by psychiatry and at both point was felt to be stable for discharge and I concur.  I will allow him to sleep in the emergency department tonight and reevaluate in the morning.    ----------------------------------------- 1:31 AM on 09/07/2017 -----------------------------------------  The patient is ambulating steady and easily. ____________________________________________ ----------------------------------------- 3:06 AM on 09/07/2017 -----------------------------------------  The patient is frustrated that I am not keeping him for several days in the hospital and he is threatening to kill himself by smothering himself with his pillow.  I reiterated to the patient did not sleep in the ER overnight.   ----------------------------------------- 4:32 AM on 09/07/2017 -----------------------------------------   END OF OBSERVATION STATUS: After an appropriate period of observation, this patient is being discharged due to the following reason(s): The patient is  clinically sober able to ambulate eating and drinking without difficulty and is no longer suicidal.  He is discharged home in improved condition verbalizes understanding and agreement with the plan.   FINAL CLINICAL IMPRESSION(S) / ED DIAGNOSES  Final diagnoses:  Malingering  Polysubstance abuse (HCC)  Alcohol abuse      NEW MEDICATIONS STARTED DURING THIS VISIT:  This SmartLink is deprecated. Use AVSMEDLIST instead to display the medication list for a patient.   Note:  This document was prepared using Dragon voice recognition software and may include  unintentional dictation errors.     Merrily Brittle, MD 09/07/17 [phone]

## 2017-09-07 NOTE — ED Notes (Signed)
Sherryl BartersStephanie AC RN is at the bedside to discuss pt concerns.

## 2017-09-07 NOTE — ED Notes (Addendum)
Attempted to call legal guardian x 2. No answer. Pt asleep at this time.

## 2017-09-07 NOTE — ED Notes (Signed)
E-signature box not working. Pt verbalized understanding and denied questions. 

## 2017-09-07 NOTE — ED Notes (Signed)
Attempted to call legal guardian x 4. No response. MD and charge made aware. Pt states he isn't going to stay and wait for an answer from legal guardian and wants to leave now. Pt demands his belongings. Belongings returned to patient.  Pt refused discharge paperwork or to sign. First nurse and charge nurse made aware of situation and first nurse plan to given cab voucher.

## 2017-11-24 ENCOUNTER — Other Ambulatory Visit: Payer: Self-pay | Admitting: Psychiatry

## 2018-03-01 ENCOUNTER — Other Ambulatory Visit: Payer: Self-pay

## 2018-03-01 ENCOUNTER — Emergency Department
Admission: EM | Admit: 2018-03-01 | Discharge: 2018-03-02 | Disposition: A | Discharge status: Home / Self Care | Payer: Medicaid Other | Attending: Emergency Medicine | Admitting: Emergency Medicine

## 2018-03-01 DIAGNOSIS — Z91010 Allergy to peanuts: Secondary | ICD-10-CM | POA: Diagnosis not present

## 2018-03-01 DIAGNOSIS — F1910 Other psychoactive substance abuse, uncomplicated: Secondary | ICD-10-CM | POA: Diagnosis not present

## 2018-03-01 DIAGNOSIS — F329 Major depressive disorder, single episode, unspecified: Secondary | ICD-10-CM | POA: Diagnosis not present

## 2018-03-01 DIAGNOSIS — R45851 Suicidal ideations: Secondary | ICD-10-CM | POA: Diagnosis not present

## 2018-03-01 DIAGNOSIS — Z79899 Other long term (current) drug therapy: Secondary | ICD-10-CM | POA: Diagnosis not present

## 2018-03-01 DIAGNOSIS — R079 Chest pain, unspecified: Secondary | ICD-10-CM

## 2018-03-01 DIAGNOSIS — F1010 Alcohol abuse, uncomplicated: Secondary | ICD-10-CM | POA: Diagnosis not present

## 2018-03-01 DIAGNOSIS — F17210 Nicotine dependence, cigarettes, uncomplicated: Secondary | ICD-10-CM | POA: Insufficient documentation

## 2018-03-01 LAB — CBC
HEMATOCRIT: 49 % (ref 40.0–52.0)
Hemoglobin: 17 g/dL (ref 13.0–18.0)
MCH: 34.5 pg — ABNORMAL HIGH (ref 26.0–34.0)
MCHC: 34.6 g/dL (ref 32.0–36.0)
MCV: 99.5 fL (ref 80.0–100.0)
PLATELETS: 179 10*3/uL (ref 150–440)
RBC: 4.92 MIL/uL (ref 4.40–5.90)
RDW: 13.6 % (ref 11.5–14.5)
WBC: 7 10*3/uL (ref 3.8–10.6)

## 2018-03-01 LAB — COMPREHENSIVE METABOLIC PANEL
ALT: 30 U/L (ref 17–63)
ANION GAP: 12 (ref 5–15)
AST: 54 U/L — ABNORMAL HIGH (ref 15–41)
Albumin: 4.3 g/dL (ref 3.5–5.0)
Alkaline Phosphatase: 79 U/L (ref 38–126)
BUN: 9 mg/dL (ref 6–20)
CHLORIDE: 101 mmol/L (ref 101–111)
CO2: 24 mmol/L (ref 22–32)
CREATININE: 0.81 mg/dL (ref 0.61–1.24)
Calcium: 8.5 mg/dL — ABNORMAL LOW (ref 8.9–10.3)
Glucose, Bld: 88 mg/dL (ref 65–99)
Potassium: 4.1 mmol/L (ref 3.5–5.1)
SODIUM: 137 mmol/L (ref 135–145)
Total Bilirubin: 0.4 mg/dL (ref 0.3–1.2)
Total Protein: 7.9 g/dL (ref 6.5–8.1)

## 2018-03-01 LAB — ETHANOL: Alcohol, Ethyl (B): 321 mg/dL (ref <10)

## 2018-03-01 LAB — TROPONIN I: Troponin I: <0.03 ng/mL (ref <0.03)

## 2018-03-01 MED ORDER — VALPROIC ACID 250 MG PO CAPS
750.0000 mg | ORAL_CAPSULE | Freq: Every day | ORAL | Status: DC
  Administered 2018-03-02: 750 mg via ORAL
  Filled 2018-03-01 (×2): qty 3

## 2018-03-01 MED ORDER — TRAZODONE HCL 50 MG PO TABS
150.0000 mg | ORAL_TABLET | Freq: Every day | ORAL | Status: DC
  Administered 2018-03-02: 03:00:00 150 mg via ORAL
  Filled 2018-03-01: qty 1

## 2018-03-01 NOTE — ED Notes (Signed)
Dr. Scotty CourtStafford made aware of Ethanol result of 321.

## 2018-03-01 NOTE — ED Provider Notes (Signed)
[facility] Emergency Department Provider Note  ____________________________________________  Time seen: Approximately 11:27 PM  I have reviewed the triage vital signs and the nursing notes.   HISTORY  Chief Complaint Suicidal    HPI Alex [name] is a 42 y.o. male with a history of cocaine and alcohol abuse as well as a seizure disorder who reports drinking several beers tonight and using cocaine yesterday and today. He also complains that he has not yet taken his valproic acid yet today. He has been taking it every day the last several days.  He also reports right-sided chest pain and inability to move his right arm. Denies any falls or sleeping on a any other complaints. He states it's been that way for 3 days, constant, without shortness of breath. Not exertional, not pleuritic. Described as achy. He doesn't know why this is happened. He requests to stay in the hospital overnight.        Past Medical History:  Diagnosis Date  . Alcohol abuse   . Anxiety   . Cocaine abuse (HCC) 05/27/2016  . Depression   . Seizures [facility](HCC)      Patient Active Problem List   Diagnosis Date Noted  . Substance induced mood disorder (HCC) 12/30/2016  . Moderate recurrent major depression (HCC) 12/30/2016  . Cocaine abuse (HCC)   . ADHD 08/11/2016  . Alcohol dependence with unspecified alcohol-induced disorder (HCC) 06/28/2016  . Alcohol withdrawal (HCC) 06/28/2016  . Cocaine use disorder, moderate, dependence (HCC) 06/28/2016  . Unspecified Depressive disorder 06/28/2016  . Tobacco use disorder 06/28/2016     Past Surgical History:  Procedure Laterality Date  . FINGER SURGERY       Prior to Admission medications   Medication Sig Start Date End Date Taking? Authorizing Provider  citalopram (CELEXA) 40 MG tablet Take 40 mg by mouth daily. 02/27/18  Yes [provider]  Multiple Vitamin (MULTIVITAMIN) tablet Take 1 tablet by mouth daily.   Yes  [provider]  omeprazole (PRILOSEC) 20 MG capsule Take 20 mg by mouth 2 (two) times daily before a meal.   Yes [provider]  traZODone (DESYREL) 150 MG tablet Take 1 tablet (150 mg total) by mouth at bedtime. 12/30/16  Yes Clapacs, Jackquline DenmarkJohn T, MD  valproic acid (DEPAKENE) 250 MG capsule Take 3 capsules (750 mg total) by mouth at bedtime. 09/06/17  Yes Clapacs, Jackquline DenmarkJohn T, MD     Allergies Peanut butter flavor   No family history on file.  Social History Social History   Tobacco Use  . Smoking status: Current Every Day Smoker    Packs/day: 1.00    Types: Cigarettes  . Smokeless tobacco: Never Used  Substance Use Topics  . Alcohol use: Yes    Alcohol/week: 2.4 - 3.0 oz    Types: 4 - 5 Cans of beer per week    Comment: Pt states that last drink was 3-4 days ago  . Drug use: Yes    Types: Marijuana, Cocaine    Comment: cocaine last used on friday    Review of Systems  Constitutional:   No fever or chills.  ENT:   No sore throat. No rhinorrhea. Cardiovascular:   positive as above chest pain without syncope. Respiratory:   No dyspnea or cough. Gastrointestinal:   Negative for abdominal pain, vomiting and diarrhea.  Musculoskeletal:   positive arm weakness. No swelling All other systems reviewed and are negative except as documented above in ROS and HPI.  ____________________________________________  PHYSICAL EXAM:  VITAL SIGNS: ED Triage Vitals  Enc Vitals Group     BP 03/01/18 2124 133/80     Pulse Rate 03/01/18 2124 88     Resp 03/01/18 2124 18     Temp 03/01/18 2124 98.5 F (36.9 C)     Temp Source 03/01/18 2124 Oral     SpO2 03/01/18 2124 97 %     Weight 03/01/18 2125 210 lb (95.3 kg)     Height 03/01/18 2125 5\' 6"  (1.676 m)     Head Circumference --      Peak Flow --      Pain Score 03/01/18 2125 0     Pain Loc --      Pain Edu? --      Excl. in GC? --     Vital signs reviewed, nursing assessments reviewed.   Constitutional:   Alert  and oriented. nontoxic Eyes:   Conjunctivae are normal. EOMI. PERRL. ENT      Head:   Normocephalic and atraumatic.      Nose:   No congestion/rhinnorhea.       Mouth/Throat:   MMM, no pharyngeal erythema. No peritonsillar mass.       Neck:   No meningismus. Full ROM. Hematological/Lymphatic/Immunilogical:   No cervical lymphadenopathy. Cardiovascular:   RRR. Symmetric bilateral radial and DP pulses.  No murmurs.  Respiratory:   Normal respiratory effort without tachypnea/retractions. Breath sounds are clear and equal bilaterally. No wheezes/rales/rhonchi. Gastrointestinal:   Soft and nontender. Non distended. There is no CVA tenderness.  No rebound, rigidity, or guarding.  Musculoskeletal:   Normal range of motion in all extremities. No joint effusions.  No lower extremity tenderness.  No edema.right chest wall tender to the touch reproducing his pain complaint. Stable, no bony tenderness, no crepitus. No skin changes Neurologic:   Normal speech and language.  Motor grossly intact.patient seems to pretend that he can't move the arm, but when I touch his bare skin with cold hands he quickly jerks away with full strength. He is also able to use his arm to pull himself sideways when asked to roll to his left so that I can listen to his breath sounds. No acute focal neurologic deficits are appreciated.  Skin:    Skin is warm, dry and intact. No rash noted.  No petechiae, purpura, or bullae.  ____________________________________________    LABS (pertinent positives/negatives) (all labs ordered are listed, but only abnormal results are displayed) Labs Reviewed  COMPREHENSIVE METABOLIC PANEL - Abnormal; Notable for the following components:      Result Value   Calcium 8.5 (*)    AST 54 (*)    All other components within normal limits  ETHANOL - Abnormal; Notable for the following components:   Alcohol, Ethyl (B) 321 (*)    All other components within normal limits  CBC - Abnormal; Notable  for the following components:   MCH 34.5 (*)    All other components within normal limits  TROPONIN I  URINE DRUG SCREEN, QUALITATIVE ([facility] ONLY)   ____________________________________________   EKG  interpreted by me  Date: 03/01/2018  Rate: 75  Rhythm: normal sinus rhythm  QRS Axis: normal  Intervals: normal  ST/T Wave abnormalities: normal  Conduction Disutrbances: none  Narrative Interpretation: unremarkable      ____________________________________________    RADIOLOGY  No results found.  ____________________________________________   PROCEDURES Procedures  ____________________________________________  DIFFERENTIAL DIAGNOSIS   polysubstance abuse, alcohol intoxication, malingering. Highly doubt ACS PE  dissection or pneumothorax pericarditis pulmonary edema or crack lung  CLINICAL IMPRESSION / ASSESSMENT AND PLAN / ED COURSE  Pertinent labs & imaging results that were available during my care of the patient were reviewed by me and considered in my medical decision making (see chart for details).      Clinical Course as of Mar 01 2326  Wed Mar 01, 2018  2149 patient reports suicidal ideation in the setting of cocaine and alcohol abuse today. This is typical behavior for him. He also reports not being out of move his right arm, but he forcefully withdraws from my cold hands when I evaluated him. There is clearly a malingering component to his presentation, as has been the case in the past. I do not think that he is a safety risk to himself or others. He is currently clinically sober. I'll obtain labs including troponin, EKG, chest x-ray given his chest pain for more than 24 hours in the setting of cocaine abuse. It is reproducible on exam and likely due to chest wall strain.    [PS]    Clinical Course User Index [PS] Sharman Cheek, MD    ----------------------------------------- 11:30 PM on  03/01/2018 -----------------------------------------  Labs normal. Breathing comfortably. Normal vital signs. We'll observe in the ED to sobriety. The patient's mom reports she can come pick her child up in the morning.   ____________________________________________   FINAL CLINICAL IMPRESSION(S) / ED DIAGNOSES    Final diagnoses:  Polysubstance abuse (HCC)  Alcohol abuse  Nonspecific chest pain     ED Discharge Orders    None      Portions of this note were generated with dragon dictation software. Dictation errors may occur despite best attempts at proofreading.    Sharman Cheek, MD 03/01/18 [phone]

## 2018-03-01 NOTE — ED Triage Notes (Signed)
Pt arrives to ED via [facility] from home with c/o suicidal thoughts. Pt reports (+) ETOH , cocaine, and marijuana use PTA.

## 2018-03-02 NOTE — ED Notes (Signed)
Patients [phone] called and stated she could pick up patient in morning if needed.

## 2018-03-02 NOTE — ED Notes (Signed)
Patient lying in bed and appears to be asleep, NAD observed and will continue to monitor 

## 2018-03-02 NOTE — ED Provider Notes (Signed)
Patient is requesting to leave, appears medically cleared for discharge.   Alex [name], [name], MD 03/02/18 [phone]

## 2018-03-16 IMAGING — DX DG FINGER MIDDLE 2+V*R*
3 series · 3 of 3 positions shown · non-contrast
Comparison: None.

CLINICAL DATA: 41 y/o  M; crush injury to distal middle finger.

EXAM:
RIGHT MIDDLE FINGER 2+V

[finger ap]
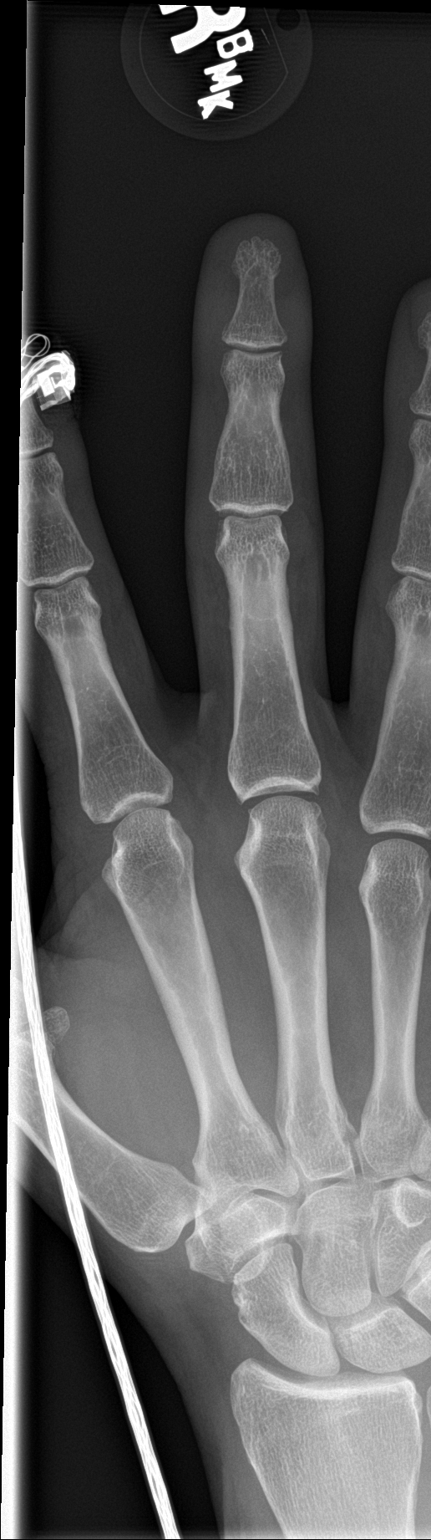

[finger obl]
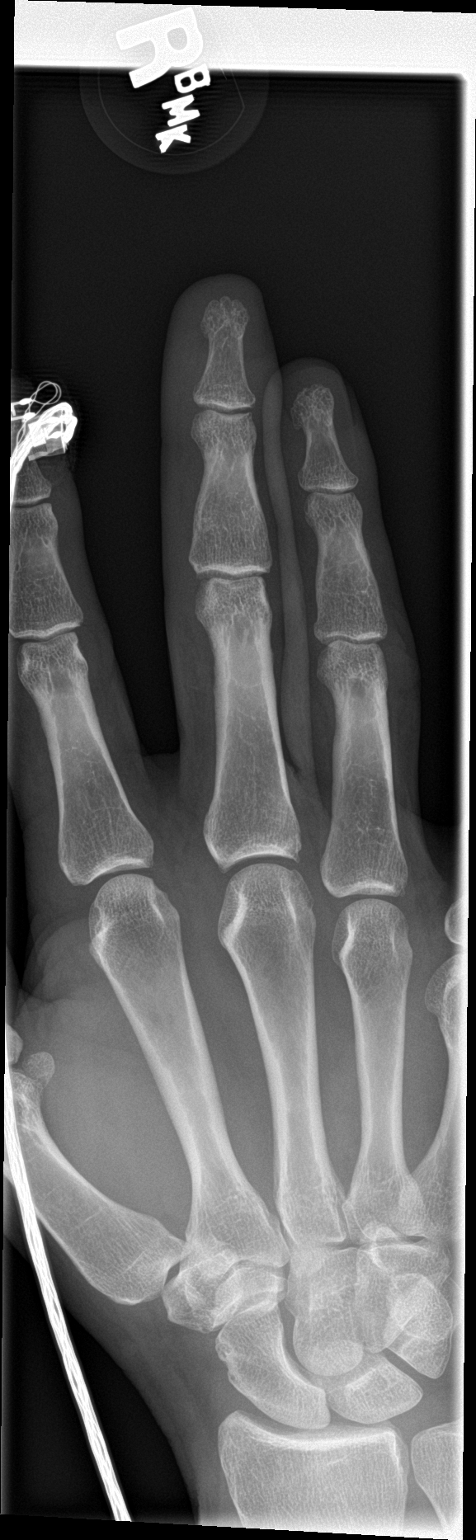

[finger lat]
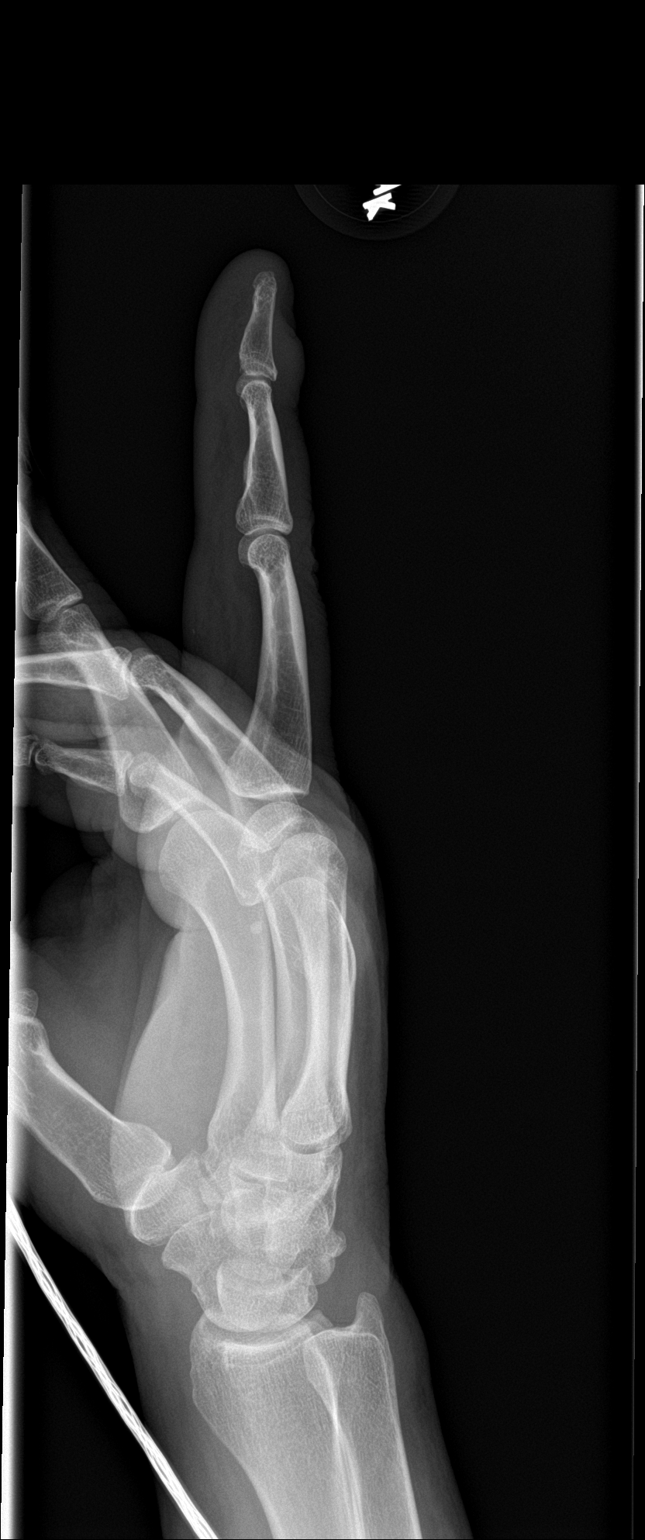

[3 of 3 positions shown; findings below may reference images not displayed]

FINDINGS: There is no evidence of fracture or dislocation. There is no
evidence of arthropathy or other focal bone abnormality. Soft
tissues are unremarkable.
IMPRESSION: No acute fracture or dislocation identified.

By: Bongie Morapedi Sikwane Rockie M.D.

## 2018-04-17 ENCOUNTER — Other Ambulatory Visit: Payer: Self-pay

## 2018-04-17 ENCOUNTER — Encounter: Payer: Self-pay | Admitting: Emergency Medicine

## 2018-04-17 ENCOUNTER — Emergency Department
Admission: EM | Admit: 2018-04-17 | Discharge: 2018-04-17 | Disposition: A | Discharge status: Home / Self Care | Payer: Medicaid Other | Source: Home / Self Care | Attending: Emergency Medicine | Admitting: Emergency Medicine

## 2018-04-17 ENCOUNTER — Emergency Department
Admission: EM | Admit: 2018-04-17 | Discharge: 2018-04-17 | Disposition: A | Discharge status: Home / Self Care | Payer: Medicaid Other | Attending: Emergency Medicine | Admitting: Emergency Medicine

## 2018-04-17 DIAGNOSIS — Y908 Blood alcohol level of 240 mg/100 ml or more: Secondary | ICD-10-CM | POA: Diagnosis not present

## 2018-04-17 DIAGNOSIS — F1994 Other psychoactive substance use, unspecified with psychoactive substance-induced mood disorder: Secondary | ICD-10-CM | POA: Diagnosis not present

## 2018-04-17 DIAGNOSIS — F331 Major depressive disorder, recurrent, moderate: Secondary | ICD-10-CM | POA: Diagnosis present

## 2018-04-17 DIAGNOSIS — F1010 Alcohol abuse, uncomplicated: Secondary | ICD-10-CM

## 2018-04-17 DIAGNOSIS — F329 Major depressive disorder, single episode, unspecified: Secondary | ICD-10-CM | POA: Diagnosis not present

## 2018-04-17 DIAGNOSIS — F609 Personality disorder, unspecified: Secondary | ICD-10-CM | POA: Insufficient documentation

## 2018-04-17 DIAGNOSIS — F10229 Alcohol dependence with intoxication, unspecified: Secondary | ICD-10-CM | POA: Insufficient documentation

## 2018-04-17 DIAGNOSIS — F320 Major depressive disorder, single episode, mild: Secondary | ICD-10-CM

## 2018-04-17 DIAGNOSIS — Z008 Encounter for other general examination: Secondary | ICD-10-CM | POA: Diagnosis not present

## 2018-04-17 DIAGNOSIS — F17210 Nicotine dependence, cigarettes, uncomplicated: Secondary | ICD-10-CM | POA: Insufficient documentation

## 2018-04-17 DIAGNOSIS — R45851 Suicidal ideations: Secondary | ICD-10-CM | POA: Diagnosis not present

## 2018-04-17 DIAGNOSIS — F10929 Alcohol use, unspecified with intoxication, unspecified: Secondary | ICD-10-CM

## 2018-04-17 DIAGNOSIS — F1420 Cocaine dependence, uncomplicated: Secondary | ICD-10-CM | POA: Diagnosis present

## 2018-04-17 DIAGNOSIS — Z79899 Other long term (current) drug therapy: Secondary | ICD-10-CM | POA: Insufficient documentation

## 2018-04-17 DIAGNOSIS — F1029 Alcohol dependence with unspecified alcohol-induced disorder: Secondary | ICD-10-CM | POA: Diagnosis present

## 2018-04-17 LAB — CBC WITH DIFFERENTIAL/PLATELET
BASOS PCT: 0 %
Basophils Absolute: 0 10*3/uL (ref 0–0.1)
EOS ABS: 0.2 10*3/uL (ref 0–0.7)
EOS PCT: 3 %
HCT: 43.1 % (ref 40.0–52.0)
Hemoglobin: 15 g/dL (ref 13.0–18.0)
Lymphocytes Relative: 33 %
Lymphs Abs: 2.5 10*3/uL (ref 1.0–3.6)
MCH: 34.8 pg — ABNORMAL HIGH (ref 26.0–34.0)
MCHC: 34.8 g/dL (ref 32.0–36.0)
MCV: 99.8 fL (ref 80.0–100.0)
MONO ABS: 0.9 10*3/uL (ref 0.2–1.0)
MONOS PCT: 12 %
Neutro Abs: 4 10*3/uL (ref 1.4–6.5)
Neutrophils Relative %: 52 %
PLATELETS: 187 10*3/uL (ref 150–440)
RBC: 4.32 MIL/uL — ABNORMAL LOW (ref 4.40–5.90)
RDW: 14.2 % (ref 11.5–14.5)
WBC: 7.6 10*3/uL (ref 3.8–10.6)

## 2018-04-17 LAB — LIPASE, BLOOD: LIPASE: 66 U/L — AB (ref 11–51)

## 2018-04-17 LAB — COMPREHENSIVE METABOLIC PANEL
ALBUMIN: 3.7 g/dL (ref 3.5–5.0)
ALK PHOS: 72 U/L (ref 38–126)
ALT: 31 U/L (ref 0–44)
ANION GAP: 12 (ref 5–15)
AST: 39 U/L (ref 15–41)
BILIRUBIN TOTAL: 0.3 mg/dL (ref 0.3–1.2)
BUN: 10 mg/dL (ref 6–20)
CALCIUM: 8.6 mg/dL — AB (ref 8.9–10.3)
CO2: 21 mmol/L — ABNORMAL LOW (ref 22–32)
Chloride: 107 mmol/L (ref 98–111)
Creatinine, Ser: 1.18 mg/dL (ref 0.61–1.24)
GFR calc Af Amer: >60 mL/min (ref >60)
GLUCOSE: 112 mg/dL — AB (ref 70–99)
Potassium: 3.4 mmol/L — ABNORMAL LOW (ref 3.5–5.1)
Sodium: 140 mmol/L (ref 135–145)
Total Protein: 6.9 g/dL (ref 6.5–8.1)

## 2018-04-17 LAB — ETHANOL: Alcohol, Ethyl (B): 335 mg/dL (ref <10)

## 2018-04-17 LAB — TROPONIN I

## 2018-04-17 MED ORDER — HALOPERIDOL LACTATE 5 MG/ML IJ SOLN
5.0000 mg | Freq: Once | INTRAMUSCULAR | Status: AC
  Administered 2018-04-17: 5 mg via INTRAVENOUS
  Filled 2018-04-17: qty 1

## 2018-04-17 NOTE — Discharge Instructions (Signed)
Please follow-up with your primary care physician within 2 days for recheck and go to residential treatment services to discuss rehabilitation should you so desire.  It was a pleasure to take care of you today, and thank you for coming to our emergency department.  If you have any questions or concerns before leaving please ask the nurse to grab me and I'm more than happy to go through your aftercare instructions again.  If you were prescribed any opioid pain medication today such as Norco, Vicodin, Percocet, morphine, hydrocodone, or oxycodone please make sure you do not drive when you are taking this medication as it can alter your ability to drive safely.  If you have any concerns once you are home that you are not improving or are in fact getting worse before you can make it to your follow-up appointment, please do not hesitate to call [phone] and come back for further evaluation.  Merrily BrittleNeil Destyn Schuyler, MD  Results for orders placed or performed during the hospital encounter of 04/17/18  Comprehensive metabolic panel  Result Value Ref Range   Sodium 140 135 - 145 mmol/L   Potassium 3.4 (L) 3.5 - 5.1 mmol/L   Chloride 107 98 - 111 mmol/L   CO2 21 (L) 22 - 32 mmol/L   Glucose, Bld 112 (H) 70 - 99 mg/dL   BUN 10 6 - 20 mg/dL   Creatinine, Ser 0.981.18 0.61 - 1.24 mg/dL   Calcium 8.6 (L) 8.9 - 10.3 mg/dL   Total Protein 6.9 6.5 - 8.1 g/dL   Albumin 3.7 3.5 - 5.0 g/dL   AST 39 15 - 41 U/L   ALT 31 0 - 44 U/L   Alkaline Phosphatase 72 38 - 126 U/L   Total Bilirubin 0.3 0.3 - 1.2 mg/dL   GFR calc non Af Amer >60 >60 mL/min   GFR calc Af Amer >60 >60 mL/min   Anion gap 12 5 - 15  Ethanol  Result Value Ref Range   Alcohol, Ethyl (B) 335 (HH) <10 mg/dL  CBC with Differential  Result Value Ref Range   WBC 7.6 3.8 - 10.6 K/uL   RBC 4.32 (L) 4.40 - 5.90 MIL/uL   Hemoglobin 15.0 13.0 - 18.0 g/dL   HCT 11.943.1 14.740.0 - 82.952.0 %   MCV 99.8 80.0 - 100.0 fL   MCH 34.8 (H) 26.0 - 34.0 pg   MCHC 34.8 32.0 - 36.0  g/dL   RDW 56.214.2 13.011.5 - 86.514.5 %   Platelets 187 150 - 440 K/uL   Neutrophils Relative % 52 %   Neutro Abs 4.0 1.4 - 6.5 K/uL   Lymphocytes Relative 33 %   Lymphs Abs 2.5 1.0 - 3.6 K/uL   Monocytes Relative 12 %   Monocytes Absolute 0.9 0.2 - 1.0 K/uL   Eosinophils Relative 3 %   Eosinophils Absolute 0.2 0 - 0.7 K/uL   Basophils Relative 0 %   Basophils Absolute 0.0 0 - 0.1 K/uL  Troponin I  Result Value Ref Range   Troponin I <0.03 <0.03 ng/mL  Lipase, blood  Result Value Ref Range   Lipase 66 (H) 11 - 51 U/L

## 2018-04-17 NOTE — ED Notes (Signed)
BEHAVIORAL HEALTH ROUNDING Patient sleeping: Yes.   Patient alert and oriented: not applicable Behavior appropriate: Yes.  ; If no, describe:  Nutrition and fluids offered: No Toileting and hygiene offered: No Sitter present: not applicable Law enforcement present: Yes  

## 2018-04-17 NOTE — ED Notes (Signed)
First Nurse Note: Patient was discharged from ED this AM, walked up to Registration and stated "If I don't get some help, I'm going to hurt myself".

## 2018-04-17 NOTE — ED Provider Notes (Signed)
The patient has been evaluated at bedside by Dr. Toni Amendlapacs, psychiatry.  Patient is clinically stable.  Not felt to be a danger to self or others.  No SI or Hi.  No indication for inpatient psychiatric admission at this time.  Appropriate for continued outpatient therapy.    Alex [name], Isaias Dowson, MD 04/17/18 [phone]

## 2018-04-17 NOTE — ED Notes (Signed)
Patient ambulates steady to bathroom. Taken to front in wheelchair by phone to contact ride. Instructions given.

## 2018-04-17 NOTE — ED Triage Notes (Signed)
Says he has been depressed and wanting to hurt self "cut myself" and others (neighbor and relatives.)  He was discharged this am.  Says his ride was not coming and he was going to walk, but he did not have shoes. Then he decided to come back and check in with suicidal thoughts.

## 2018-04-17 NOTE — ED Notes (Signed)
BEHAVIORAL HEALTH ROUNDING Patient sleeping: No. Patient alert and oriented: yes Behavior appropriate: Yes.  ; If no, describe:  Nutrition and fluids offered: Yes  Toileting and hygiene offered: Yes  Sitter present: not applicable Law enforcement present: Yes  

## 2018-04-17 NOTE — ED Notes (Signed)
Patient sleeping in recliner in NAD.

## 2018-04-17 NOTE — ED Provider Notes (Signed)
[facility] Emergency Department Provider Note   ____________________________________________   First MD Initiated Contact with Patient 04/17/18 1314     (approximate)  I have reviewed the triage vital signs and the nursing notes.   HISTORY  Chief Complaint Suicidal and Homicidal    HPI Alex [name] is a 42 y.o. male who was seen last night for suicidal ideation discharge.  He is known to Police Department for standing on a bridge and saying he is suicidal.  Per my signout from last night.  As I said patient was discharged was starting to go home could not get a ride did not have any shoes or could not walk and decided he was suicidal again.  Came back in to be checked.  He says he is suicidal all the time when I speak to him.   Past Medical History:  Diagnosis Date  . Alcohol abuse   . Anxiety   . Cocaine abuse (HCC) 05/27/2016  . Depression   . Seizures [facility](HCC)     Patient Active Problem List   Diagnosis Date Noted  . Substance induced mood disorder (HCC) 12/30/2016  . Moderate recurrent major depression (HCC) 12/30/2016  . Cocaine abuse (HCC)   . ADHD 08/11/2016  . Alcohol dependence with unspecified alcohol-induced disorder (HCC) 06/28/2016  . Alcohol withdrawal (HCC) 06/28/2016  . Cocaine use disorder, moderate, dependence (HCC) 06/28/2016  . Unspecified Depressive disorder 06/28/2016  . Tobacco use disorder 06/28/2016    Past Surgical History:  Procedure Laterality Date  . FINGER SURGERY      Prior to Admission medications   Medication Sig Start Date End Date Taking? Authorizing Provider  citalopram (CELEXA) 40 MG tablet Take 40 mg by mouth daily. 02/27/18  Yes [provider]  Multiple Vitamin (MULTIVITAMIN) tablet Take 1 tablet by mouth daily.   Yes [provider]  omeprazole (PRILOSEC) 20 MG capsule Take 20 mg by mouth 2 (two) times daily before a meal.   Yes [provider]  traZODone (DESYREL) 150 MG  tablet Take 1 tablet (150 mg total) by mouth at bedtime. 12/30/16  Yes Clapacs, Jackquline DenmarkJohn T, MD  valproic acid (DEPAKENE) 250 MG capsule Take 3 capsules (750 mg total) by mouth at bedtime. 09/06/17  Yes Clapacs, Jackquline DenmarkJohn T, MD    Allergies Peanut butter flavor  No family history on file.  Social History Social History   Tobacco Use  . Smoking status: Current Every Day Smoker    Packs/day: 1.00    Types: Cigarettes  . Smokeless tobacco: Never Used  Substance Use Topics  . Alcohol use: Yes    Alcohol/week: 2.4 - 3.0 oz    Types: 4 - 5 Cans of beer per week    Comment: Pt states that last drink was 3-4 days ago  . Drug use: Yes    Types: Marijuana, Cocaine    Comment: cocaine last used on friday    Review of Systems  Constitutional: No fever/chills Eyes: No visual changes. ENT: No sore throat. Cardiovascular: Denies chest pain. Respiratory: Denies shortness of breath. Gastrointestinal: No abdominal pain.  No nausea, no vomiting.  No diarrhea.  No constipation. Genitourinary: Negative for dysuria. Musculoskeletal: Negative for back pain. Skin: Negative for rash. Neurological: Negative for headaches, focal weakness   ____________________________________________   PHYSICAL EXAM:  VITAL SIGNS: ED Triage Vitals  Enc Vitals Group     BP 04/17/18 0905 114/85     Pulse Rate 04/17/18 0905 89  Resp 04/17/18 0905 16     Temp 04/17/18 0905 97.6 F (36.4 C)     Temp Source 04/17/18 0905 Oral     SpO2 04/17/18 0905 96 %     Weight 04/17/18 0905 220 lb (99.8 kg)     Height 04/17/18 0905 5\' 6"  (1.676 m)     Head Circumference --      Peak Flow --      Pain Score 04/17/18 0903 0     Pain Loc --      Pain Edu? --      Excl. in GC? --     Constitutional: Alert and oriented. Well appearing and in no acute distress. Eyes: Conjunctivae are normal.  Head: Atraumatic. Nose: No congestion/rhinnorhea. Mouth/Throat: Mucous membranes are moist.  Oropharynx non-erythematous. Neck: No  stridor.   Cardiovascular: Normal rate, regular rhythm. Grossly normal heart sounds.  Good peripheral circulation. Respiratory: Normal respiratory effort.  No retractions. Lungs CTAB. Gastrointestinal: Soft and nontender. No distention. No abdominal bruits. No CVA tenderness. Musculoskeletal: No lower extremity tenderness nor edema.  No joint effusions. Neurologic:  Normal speech and language. No gross focal neurologic deficits are appreciated. No gait instability. Skin:  Skin is warm, dry and intact. No rash noted. Psychiatric: Mood and affect are normal. Speech and behavior are normal.  ____________________________________________   LABS (all labs ordered are listed, but only abnormal results are displayed)  Labs Reviewed - No data to display ____________________________________________  EKG   ____________________________________________  RADIOLOGY  ED MD interpretation:    Official radiology report(s): No results found.  ____________________________________________   PROCEDURES  Procedure(s) performed:   Procedures  Critical Care performed:   ____________________________________________   INITIAL IMPRESSION / ASSESSMENT AND PLAN / ED COURSE  Patient seen by Dr. Toni Amend he does not think he needs to be committed to the hospital.  He can get outpatient care.  Patient then decides he wants to leave and does so.      ____________________________________________   FINAL CLINICAL IMPRESSION(S) / ED DIAGNOSES  Final diagnoses:  Current mild episode of major depressive disorder without prior episode [facility])     ED Discharge Orders    None       Note:  This document was prepared using Dragon voice recognition software and may include unintentional dictation errors.    Arnaldo Natal, MD 04/17/18 [id number]

## 2018-04-17 NOTE — ED Notes (Signed)
Awake sitting up in chair, await consults.

## 2018-04-17 NOTE — ED Notes (Signed)
Patient changed into scrubs, officer summoned for observation, patient in recliner in 19H.

## 2018-04-17 NOTE — ED Notes (Signed)

## 2018-04-17 NOTE — ED Notes (Signed)
Awakened, served juice which patient takes and tol well.

## 2018-04-17 NOTE — Consult Note (Signed)
[facility] Consult   Reason for Consult: Consult for this 42 year old man with a history of alcohol and drug abuse Referring Physician: Quentin Cornwall Patient Identification: Alex [name] MRN:  [id number] Principal Diagnosis: Substance induced mood disorder [facility]) Diagnosis:   Patient Active Problem List   Diagnosis Date Noted  . Substance induced mood disorder ([place]) [F19.94] 12/30/2016  . Moderate recurrent major depression ([place]) [F33.1] 12/30/2016  . Cocaine abuse ([place]) [F14.10]   . ADHD [F90.9] 08/11/2016  . Alcohol dependence with unspecified alcohol-induced disorder ([place]) [F10.29] 06/28/2016  . Alcohol withdrawal ([place]) [F10.239] 06/28/2016  . Cocaine use disorder, moderate, dependence ([place]) [F14.20] 06/28/2016  . Unspecified Depressive disorder [F32.9] 06/28/2016  . Tobacco use disorder [F17.200] 06/28/2016    Total Time spent with patient: 1 hour  Subjective:   Alex [name] is a 42 y.o. male patient admitted with "I do not even know how I got here".  HPI: Patient seen chart reviewed.  Patient known from previous encounters.  42 year old man with a history of alcohol and drug abuse.  Came to the emergency room intoxicated and was initially discharged but then came back to the front desk almost immediately saying that he was suicidal.  He was still intoxicated at that time.  He has rested all day and is no longer drunk.  Patient has only vague memories of what is happening but says that yesterday he got into the alcohol and was drinking heavily.  Also understands he used some cocaine.  Patient says he had been staying sober for weeks before that and had been going to outpatient mental health and substance abuse treatment.  Mood had been staying pretty stable but he gets frustrated and bored from time to time.  He denies any suicidal or homicidal ideation denies any psychotic symptoms.  Not showing any major signs of withdrawal.  Social history: Lives with his mother in  [place].  Frustrating experience for him.  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  Medical history: No ongoing medical issues  Substance abuse history: History of cocaine and alcohol abuse chronically with drug abuse always being a major part of his presentation  Past Psychiatric History: Patient has been seen multiple times before in the emergency room and has had prior psych admissions.  Usually returns to his baseline mental state within less than 24 hours after presentation.  Rarely seems to get much benefit from inpatient treatment.  Has been treated with antidepressants and seems to perhaps finally be getting some sobriety.  Patient has threatened to hurt himself in the past but no real suicide attempts.  No history of violence or psychosis  Risk to Self:   Risk to Others:   Prior Inpatient Therapy:   Prior Outpatient Therapy:    Past Medical History:  Past Medical History:  Diagnosis Date  . Alcohol abuse   . Anxiety   . Cocaine abuse ([place]) 05/27/2016  . Depression   . Seizures ([place])     Past Surgical History:  Procedure Laterality Date  . FINGER SURGERY     Family History: History reviewed. No pertinent family history. Family Psychiatric  History: Positive for substance abuse and depression Social History:  Social History   Substance and Sexual Activity  Alcohol Use Yes  . Alcohol/week: 2.4 - 3.0 oz  . Types: 4 - 5 Cans of beer per week   Comment: Pt states that last drink was 3-4 days ago     Social History   Substance and  Sexual Activity  Drug Use Yes  . Types: Marijuana, Cocaine   Comment: cocaine last used on friday    Social History   Socioeconomic History  . Marital status: Single    Spouse name: Not on file  . Number of children: Not on file  . Years of education: Not on file  . Highest education level: Not on file  Occupational History  . Not on file  Social Needs  . Financial resource strain: Not on file  . Food insecurity:    Worry:  Not on file    Inability: Not on file  . Transportation needs:    Medical: Not on file    Non-medical: Not on file  Tobacco Use  . Smoking status: Current Every Day Smoker    Packs/day: 1.00    Types: Cigarettes  . Smokeless tobacco: Never Used  Substance and Sexual Activity  . Alcohol use: Yes    Alcohol/week: 2.4 - 3.0 oz    Types: 4 - 5 Cans of beer per week    Comment: Pt states that last drink was 3-4 days ago  . Drug use: Yes    Types: Marijuana, Cocaine    Comment: cocaine last used on friday  . Sexual activity: Not on file  Lifestyle  . Physical activity:    Days per week: Not on file    Minutes per session: Not on file  . Stress: Not on file  Relationships  . Social connections:    Talks on phone: Not on file    Gets together: Not on file    Attends religious service: Not on file    Active member of club or organization: Not on file    Attends meetings of clubs or organizations: Not on file    Relationship status: Not on file  Other Topics Concern  . Not on file  Social History Narrative  . Not on file   Additional Social History:    Allergies:   Allergies  Allergen Reactions  . Peanut Butter Flavor     Labs:  Results for orders placed or performed during the hospital encounter of 04/17/18 (from the past 48 hour(s))  Comprehensive metabolic panel     Status: Abnormal   Collection Time: 04/17/18 12:58 AM  Result Value Ref Range   Sodium 140 135 - 145 mmol/L   Potassium 3.4 (L) 3.5 - 5.1 mmol/L   Chloride 107 98 - 111 mmol/L    Comment: Please note change in reference range.   CO2 21 (L) 22 - 32 mmol/L   Glucose, Bld 112 (H) 70 - 99 mg/dL    Comment: Please note change in reference range.   BUN 10 6 - 20 mg/dL    Comment: Please note change in reference range.   Creatinine, Ser 1.18 0.61 - 1.24 mg/dL   Calcium 8.6 (L) 8.9 - 10.3 mg/dL   Total Protein 6.9 6.5 - 8.1 g/dL   Albumin 3.7 3.5 - 5.0 g/dL   AST 39 15 - 41 U/L   ALT 31 0 - 44 U/L     Comment: Please note change in reference range.   Alkaline Phosphatase 72 38 - 126 U/L   Total Bilirubin 0.3 0.3 - 1.2 mg/dL   GFR calc non Af Amer >60 >60 mL/min   GFR calc Af Amer >60 >60 mL/min    Comment: (NOTE) The eGFR has been calculated using the CKD EPI equation. This calculation has not been validated in all clinical  situations. eGFR's persistently <60 mL/min signify possible Chronic Kidney Disease.    Anion gap 12 5 - 15    Comment: Performed at [facility], [address] [place], [place] [address]  Ethanol     Status: Abnormal   Collection Time: 04/17/18 12:58 AM  Result Value Ref Range   Alcohol, Ethyl (B) 335 (HH) <10 mg/dL    Comment: CRITICAL RESULT CALLED TO, READ BACK BY AND VERIFIED WITH ANN CALES AT 0129 04/17/18.PMH (NOTE) Lowest detectable limit for serum alcohol is 10 mg/dL. For medical purposes only. Performed at [facility], [address] [place], [place] [address]   CBC with Differential     Status: Abnormal   Collection Time: 04/17/18 12:58 AM  Result Value Ref Range   WBC 7.6 3.8 - 10.6 K/uL   RBC 4.32 (L) 4.40 - 5.90 MIL/uL   Hemoglobin 15.0 13.0 - 18.0 g/dL   HCT 43.1 40.0 - 52.0 %   MCV 99.8 80.0 - 100.0 fL   MCH 34.8 (H) 26.0 - 34.0 pg   MCHC 34.8 32.0 - 36.0 g/dL   RDW 14.2 11.5 - 14.5 %   Platelets 187 150 - 440 K/uL   Neutrophils Relative % 52 %   Neutro Abs 4.0 1.4 - 6.5 K/uL   Lymphocytes Relative 33 %   Lymphs Abs 2.5 1.0 - 3.6 K/uL   Monocytes Relative 12 %   Monocytes Absolute 0.9 0.2 - 1.0 K/uL   Eosinophils Relative 3 %   Eosinophils Absolute 0.2 0 - 0.7 K/uL   Basophils Relative 0 %   Basophils Absolute 0.0 0 - 0.1 K/uL    Comment: Performed at [facility], [address] [place], [place] [address]  Troponin I     Status: None   Collection Time: 04/17/18 12:58 AM  Result Value Ref Range   Troponin I <0.03 <0.03 ng/mL    Comment: Performed at [facility], [address]  [place],  [address]  Lipase, blood     Status: Abnormal   Collection Time: 04/17/18 12:58 AM  Result Value Ref Range   Lipase 66 (H) 11 - 51 U/L    Comment: Performed at [facility], [address] [place],  [address]    No current facility-administered medications for this encounter.    Current Outpatient Medications  Medication Sig Dispense Refill  . citalopram (CELEXA) 40 MG tablet Take 40 mg by mouth daily.  5  . Multiple Vitamin (MULTIVITAMIN) tablet Take 1 tablet by mouth daily.    Marland Kitchen omeprazole (PRILOSEC) 20 MG capsule Take 20 mg by mouth 2 (two) times daily before a meal.    . traZODone (DESYREL) 150 MG tablet Take 1 tablet (150 mg total) by mouth at bedtime. 30 tablet 1  . valproic acid (DEPAKENE) 250 MG capsule Take 3 capsules (750 mg total) by mouth at bedtime. 90 capsule 1    Musculoskeletal: Strength & Muscle Tone: within normal limits Gait & Station: normal Patient leans: N/A  Psychiatric Specialty Exam: Physical Exam  Nursing note and vitals reviewed. Constitutional: He appears well-developed and well-nourished.  HENT:  Head: Normocephalic and atraumatic.  Eyes: Pupils are equal, round, and reactive to light. Conjunctivae are normal.  Neck: Normal range of motion.  Cardiovascular: Regular rhythm and normal heart sounds.  Respiratory: Effort normal. No respiratory distress.  GI: Soft. He exhibits no distension.  Musculoskeletal: Normal range of motion.  Neurological: He is alert.  Skin: Skin is  warm and dry.  Psychiatric: His affect is blunt. His speech is delayed. He is slowed. Thought content is not paranoid. Cognition and memory are impaired. He expresses impulsivity. He expresses no homicidal and no suicidal ideation.    Review of Systems  Constitutional: Negative.   HENT: Negative.   Eyes: Negative.   Respiratory: Negative.   Cardiovascular: Negative.   Gastrointestinal: Negative.   Musculoskeletal: Negative.   Skin: Negative.    Neurological: Negative.   Psychiatric/Behavioral: Positive for depression and substance abuse. Negative for hallucinations, memory loss and suicidal ideas. The patient is not nervous/anxious and does not have insomnia.     Blood pressure 106/77, pulse 79, temperature (!) 97.2 F (36.2 C), temperature source Oral, resp. rate 18, height 5' 6"  (1.676 m), weight 99.8 kg (220 lb), SpO2 94 %.Body mass index is 35.51 kg/m.  General Appearance: Disheveled  Eye Contact:  Fair  Speech:  Clear and Coherent  Volume:  Decreased  Mood:  Anxious  Affect:  Appropriate  Thought Process:  Goal Directed  Orientation:  Full (Time, Place, and Person)  Thought Content:  Logical  Suicidal Thoughts:  No  Homicidal Thoughts:  No  Memory:  Immediate;   Fair Recent;   Fair Remote;   Fair  Judgement:  Fair  Insight:  Fair  Psychomotor Activity:  Normal  Concentration:  Concentration: Fair  Recall:  [employer] of Knowledge:  Fair  Language:  Fair  Akathisia:  No  Handed:  Right  AIMS (if indicated):     Assets:  Desire for Improvement Housing Physical Health  ADL's:  Intact  Cognition:  WNL  Sleep:        Treatment Plan Summary: Plan Patient with chronic alcohol and cocaine abuse who had been staying sober but yesterday relapsed.  Patient is not reporting suicidal or homicidal thought.  Behavior is calm and does not appear to be psychotic.  Physically stable.  Patient does not meet criteria for commitment and does not need inpatient treatment.  Case reviewed with the ER doctor and TTS.  Patient was given a lot of support for his sobriety.  Patient can be discharged home from the emergency room with local follow-up  Disposition: No evidence of imminent risk to self or others at present.   Patient does not meet criteria for psychiatric inpatient admission. Supportive therapy provided about ongoing stressors. Discussed crisis plan, support from social network, calling 911, coming to the Emergency  Department, and calling Suicide Hotline.  Alethia Berthold, MD 04/17/2018 4:52 PM

## 2018-04-17 NOTE — ED Triage Notes (Signed)
This is a 42 y.o. Male; who presents to the ED via EMS for C/O having chest pain; suicidal thoughts; and polysubstance abuse; with intoxication.
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
## 2018-05-11 IMAGING — DX DG KNEE COMPLETE 4+V*R*
4 series · 4 of 4 positions shown · non-contrast
Comparison: None.

CLINICAL DATA: Right knee pain after a fall.

EXAM:
RIGHT KNEE - COMPLETE 4+ VIEW

[knee ap]
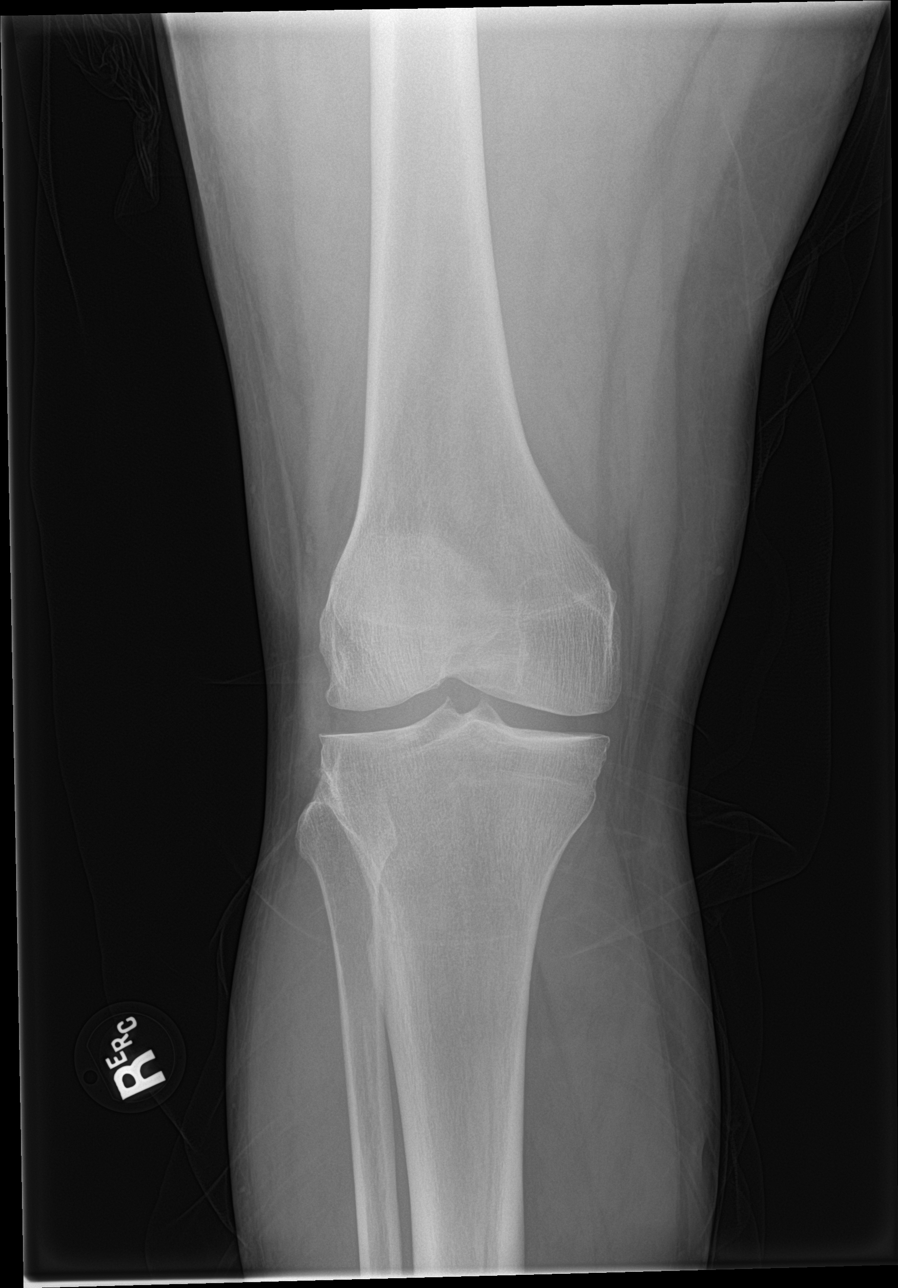

[knee tunnel]
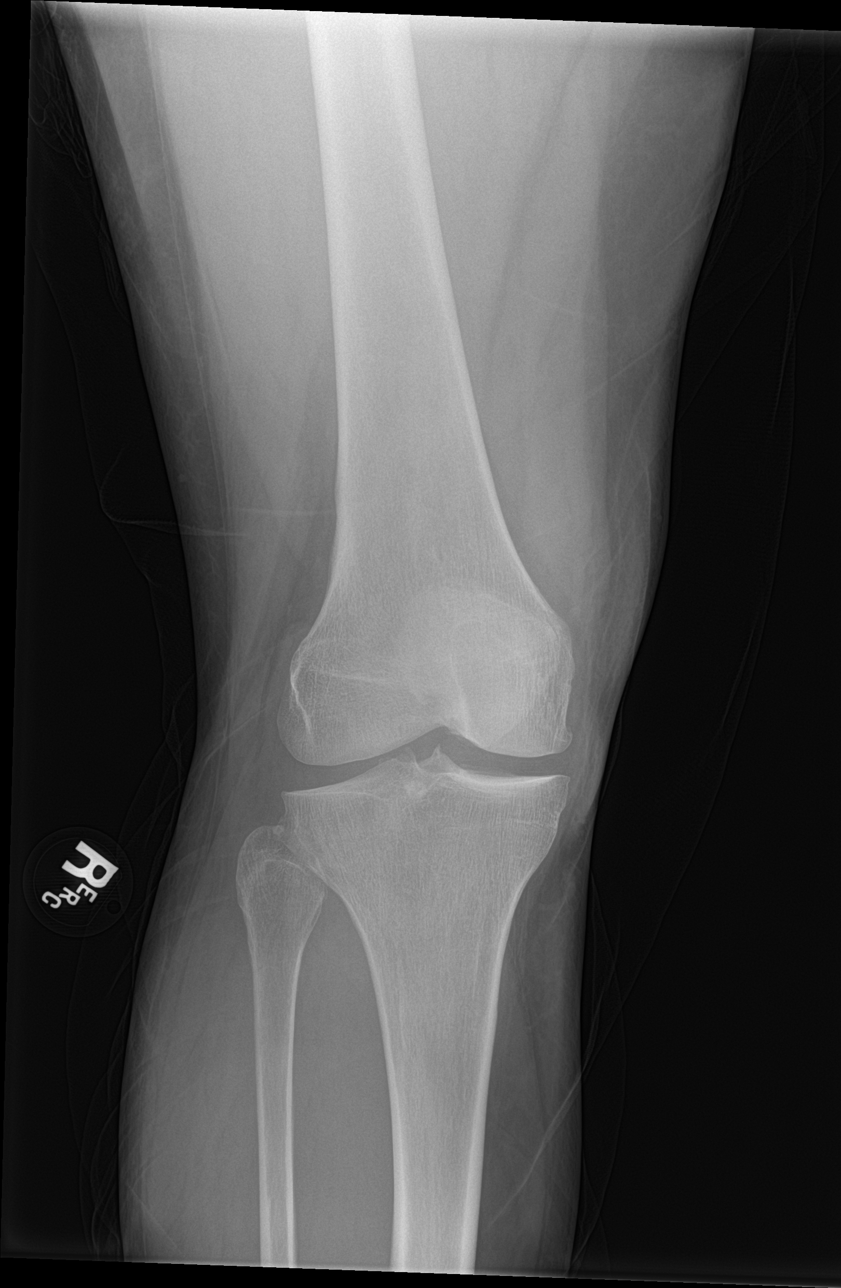

[knee lat]
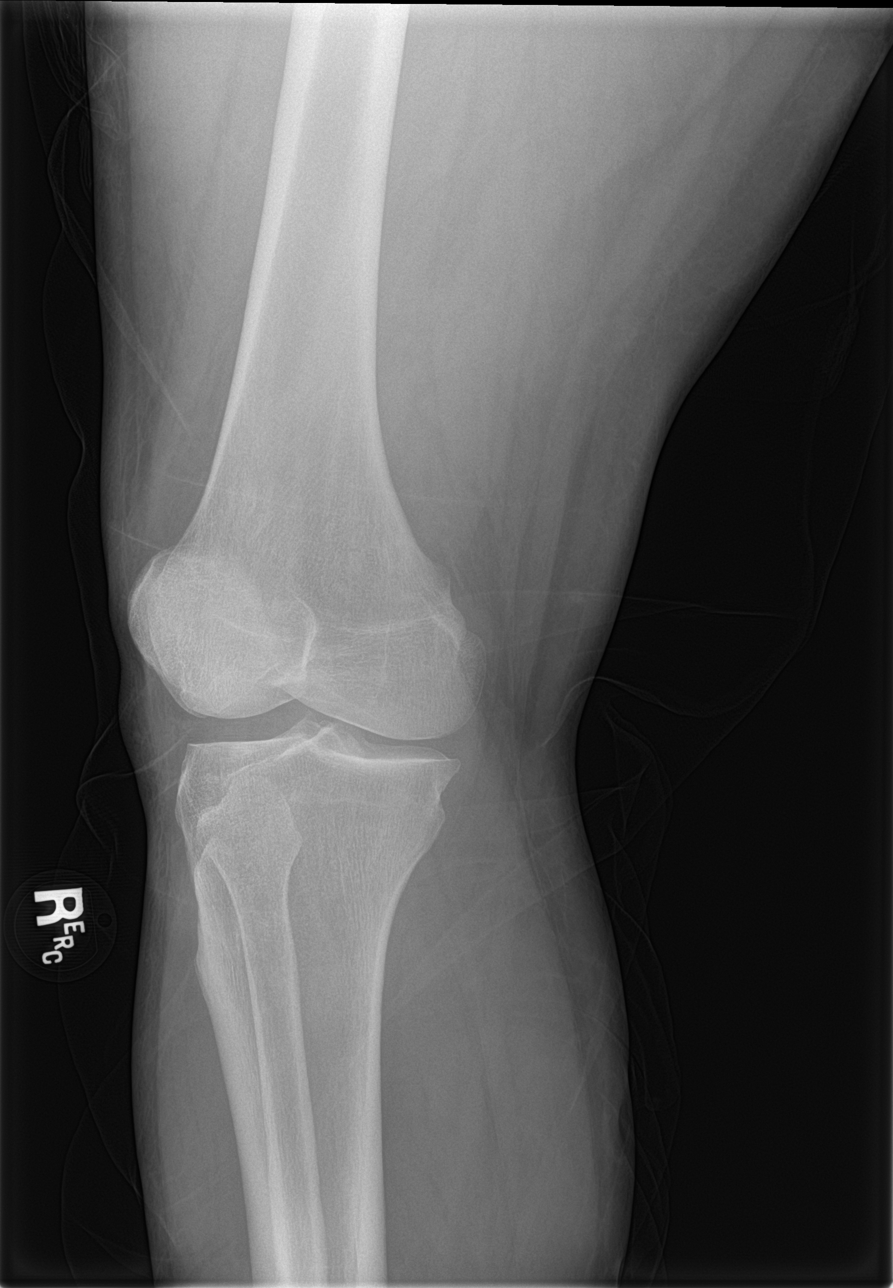

[knee obl]
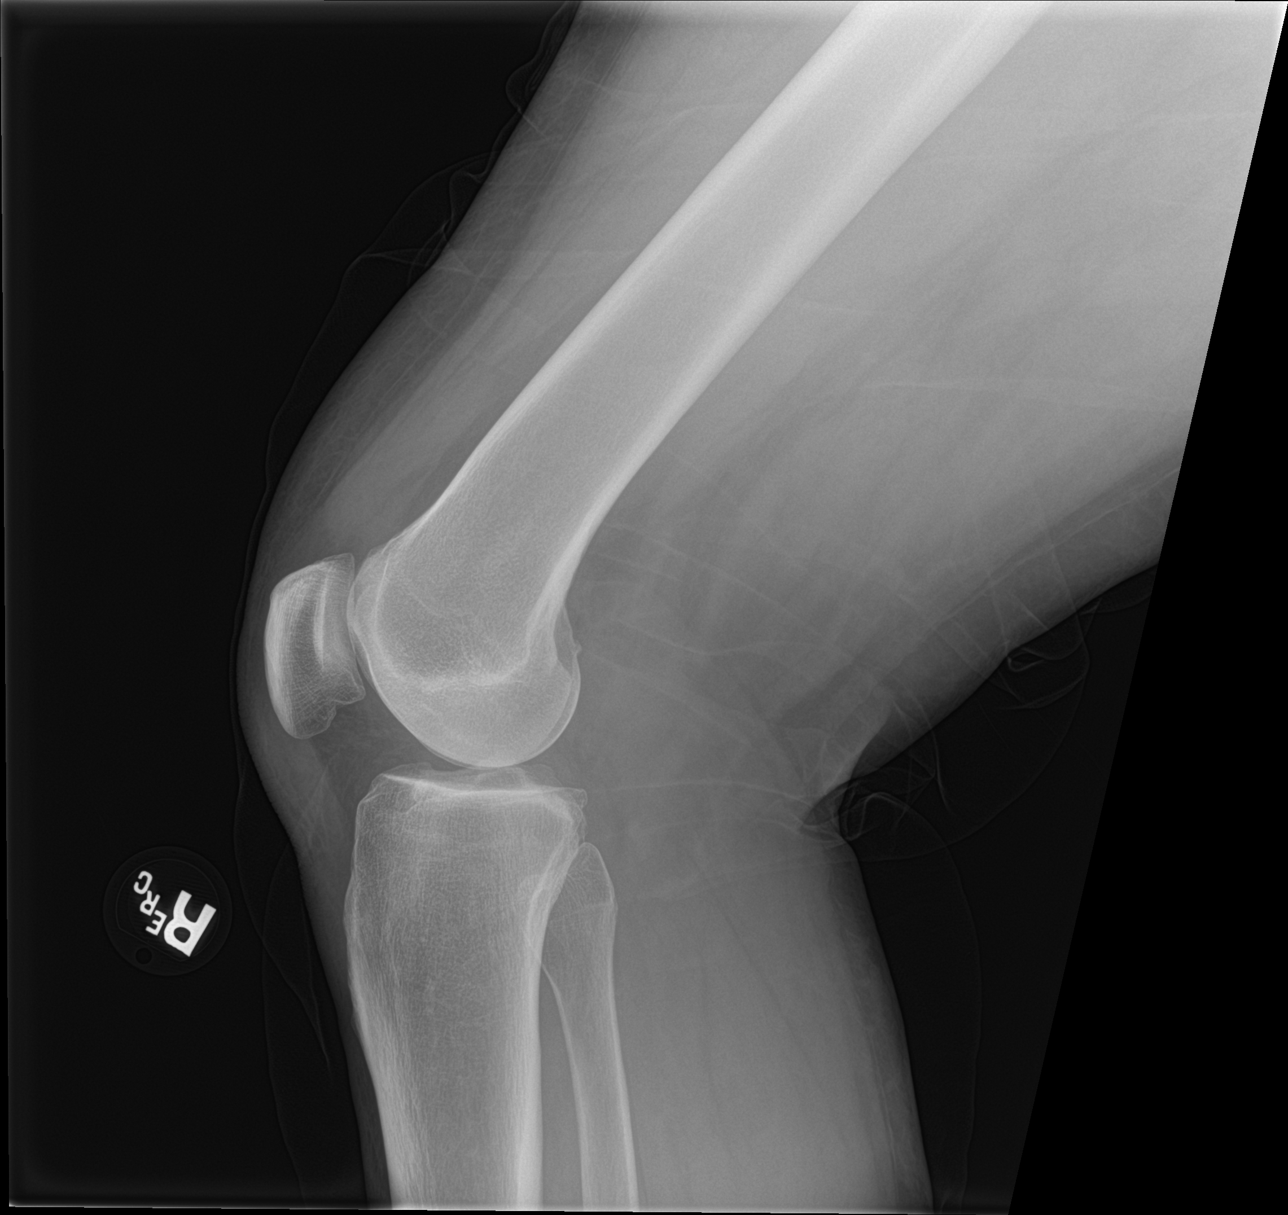

[4 of 4 positions shown; findings below may reference images not displayed]

FINDINGS: No evidence of fracture, dislocation, or joint effusion. No evidence
of arthropathy or other focal bone abnormality. Soft tissues are
unremarkable.
IMPRESSION: Negative.

## 2018-06-05 ENCOUNTER — Emergency Department: Payer: Medicaid Other

## 2018-06-05 ENCOUNTER — Emergency Department
Admission: EM | Admit: 2018-06-05 | Discharge: 2018-06-06 | Disposition: A | Discharge status: Other Acute Inpatient Hospital | Payer: Medicaid Other | Attending: Emergency Medicine | Admitting: Emergency Medicine

## 2018-06-05 ENCOUNTER — Encounter: Payer: Self-pay | Admitting: Emergency Medicine

## 2018-06-05 ENCOUNTER — Other Ambulatory Visit: Payer: Self-pay

## 2018-06-05 DIAGNOSIS — Y939 Activity, unspecified: Secondary | ICD-10-CM | POA: Insufficient documentation

## 2018-06-05 DIAGNOSIS — F329 Major depressive disorder, single episode, unspecified: Secondary | ICD-10-CM

## 2018-06-05 DIAGNOSIS — R079 Chest pain, unspecified: Secondary | ICD-10-CM

## 2018-06-05 DIAGNOSIS — F1410 Cocaine abuse, uncomplicated: Secondary | ICD-10-CM | POA: Diagnosis present

## 2018-06-05 DIAGNOSIS — S058X1A Other injuries of right eye and orbit, initial encounter: Secondary | ICD-10-CM | POA: Insufficient documentation

## 2018-06-05 DIAGNOSIS — F10239 Alcohol dependence with withdrawal, unspecified: Secondary | ICD-10-CM | POA: Insufficient documentation

## 2018-06-05 DIAGNOSIS — S0011XA Contusion of right eyelid and periocular area, initial encounter: Secondary | ICD-10-CM

## 2018-06-05 DIAGNOSIS — F1029 Alcohol dependence with unspecified alcohol-induced disorder: Secondary | ICD-10-CM | POA: Diagnosis present

## 2018-06-05 DIAGNOSIS — F17210 Nicotine dependence, cigarettes, uncomplicated: Secondary | ICD-10-CM | POA: Insufficient documentation

## 2018-06-05 DIAGNOSIS — F909 Attention-deficit hyperactivity disorder, unspecified type: Secondary | ICD-10-CM | POA: Insufficient documentation

## 2018-06-05 DIAGNOSIS — F331 Major depressive disorder, recurrent, moderate: Secondary | ICD-10-CM | POA: Diagnosis present

## 2018-06-05 DIAGNOSIS — Y999 Unspecified external cause status: Secondary | ICD-10-CM | POA: Insufficient documentation

## 2018-06-05 DIAGNOSIS — R0789 Other chest pain: Secondary | ICD-10-CM | POA: Insufficient documentation

## 2018-06-05 DIAGNOSIS — Z79899 Other long term (current) drug therapy: Secondary | ICD-10-CM | POA: Insufficient documentation

## 2018-06-05 DIAGNOSIS — Y929 Unspecified place or not applicable: Secondary | ICD-10-CM | POA: Insufficient documentation

## 2018-06-05 DIAGNOSIS — F1994 Other psychoactive substance use, unspecified with psychoactive substance-induced mood disorder: Secondary | ICD-10-CM | POA: Insufficient documentation

## 2018-06-05 DIAGNOSIS — R45851 Suicidal ideations: Secondary | ICD-10-CM | POA: Insufficient documentation

## 2018-06-05 LAB — ACETAMINOPHEN LEVEL: Acetaminophen (Tylenol), Serum: <10 ug/mL — ABNORMAL LOW (ref 10–30)

## 2018-06-05 LAB — URINE DRUG SCREEN, QUALITATIVE (ARMC ONLY)
AMPHETAMINES, UR SCREEN: NOT DETECTED
Barbiturates, Ur Screen: NOT DETECTED
Benzodiazepine, Ur Scrn: NOT DETECTED
Cannabinoid 50 Ng, Ur ~~LOC~~: POSITIVE — AB
Cocaine Metabolite,Ur ~~LOC~~: POSITIVE — AB
MDMA (ECSTASY) UR SCREEN: NOT DETECTED
Methadone Scn, Ur: NOT DETECTED
OPIATE, UR SCREEN: NOT DETECTED
PHENCYCLIDINE (PCP) UR S: NOT DETECTED
Tricyclic, Ur Screen: NOT DETECTED

## 2018-06-05 LAB — CBC
HEMATOCRIT: 41.7 % (ref 40.0–52.0)
Hemoglobin: 14.6 g/dL (ref 13.0–18.0)
MCH: 34.4 pg — ABNORMAL HIGH (ref 26.0–34.0)
MCHC: 35 g/dL (ref 32.0–36.0)
MCV: 98.4 fL (ref 80.0–100.0)
Platelets: 219 10*3/uL (ref 150–440)
RBC: 4.24 MIL/uL — AB (ref 4.40–5.90)
RDW: 13.9 % (ref 11.5–14.5)
WBC: 6.9 10*3/uL (ref 3.8–10.6)

## 2018-06-05 LAB — COMPREHENSIVE METABOLIC PANEL
ALBUMIN: 3.9 g/dL (ref 3.5–5.0)
ALT: 25 U/L (ref 0–44)
AST: 34 U/L (ref 15–41)
Alkaline Phosphatase: 91 U/L (ref 38–126)
Anion gap: 12 (ref 5–15)
BILIRUBIN TOTAL: 0.5 mg/dL (ref 0.3–1.2)
BUN: 7 mg/dL (ref 6–20)
CHLORIDE: 98 mmol/L (ref 98–111)
CO2: 22 mmol/L (ref 22–32)
Calcium: 8.4 mg/dL — ABNORMAL LOW (ref 8.9–10.3)
Creatinine, Ser: 0.6 mg/dL — ABNORMAL LOW (ref 0.61–1.24)
GFR calc Af Amer: >60 mL/min (ref >60)
GFR calc non Af Amer: >60 mL/min (ref >60)
GLUCOSE: 100 mg/dL — AB (ref 70–99)
Potassium: 3.8 mmol/L (ref 3.5–5.1)
Sodium: 132 mmol/L — ABNORMAL LOW (ref 135–145)
Total Protein: 7.4 g/dL (ref 6.5–8.1)

## 2018-06-05 LAB — SALICYLATE LEVEL: Salicylate Lvl: <7 mg/dL (ref 2.8–30.0)

## 2018-06-05 LAB — ETHANOL: Alcohol, Ethyl (B): 151 mg/dL — ABNORMAL HIGH (ref <10)

## 2018-06-05 MED ORDER — IBUPROFEN 400 MG PO TABS
400.0000 mg | ORAL_TABLET | Freq: Once | ORAL | Status: AC
  Administered 2018-06-05: 400 mg via ORAL
  Filled 2018-06-05: qty 1

## 2018-06-05 MED ORDER — ACETAMINOPHEN 325 MG PO TABS
650.0000 mg | ORAL_TABLET | Freq: Once | ORAL | Status: AC
  Administered 2018-06-05: 650 mg via ORAL
  Filled 2018-06-05: qty 2

## 2018-06-05 NOTE — ED Notes (Signed)
Report to include Situation, Background, Assessment, and Recommendations received from RN Amy. Patient alert and oriented, warm and dry, in no acute distress. Patient denies SI, HI, AVH and pain. Patient made aware of Q15 minute rounds and Rover and Officer presence for their safety. Patient instructed to come to me with needs or concerns.    

## 2018-06-05 NOTE — ED Notes (Signed)
Hourly rounding reveals patient in room. No complaints, stable, in no acute distress. Q15 minute rounds and monitoring via Rover and Officer to continue.   

## 2018-06-05 NOTE — ED Triage Notes (Signed)
Pt brought in via [employer] with IVC papers. Per IVC papers he is irritable and argumentative, he called the hospital operator and told her that he was going to kill himself and hung up.
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
## 2018-06-05 NOTE — ED Notes (Signed)
Pt. Transferred to [facility] from ED to room 1 after screening for contraband. Report to include Situation, Background, Assessment and Recommendations from Hewan RN. Pt. Oriented to unit including Q15 minute rounds as well as the security cameras for their protection. Patient is alert and oriented, warm and dry in no acute distress. Patient denies SI, HI, and AVH. Pt. Encouraged to let me know if needs arise. 

## 2018-06-05 NOTE — ED Notes (Signed)

## 2018-06-05 NOTE — ED Notes (Signed)
Hourly rounding reveals patient in room. No complaints, stable, in no acute distress. Q15 minute rounds and monitoring via Security Cameras to continue. 

## 2018-06-05 NOTE — ED Notes (Signed)
BEHAVIORAL HEALTH ROUNDING Patient sleeping: No. Patient alert and oriented: yes Behavior appropriate: Yes.  ; If no, describe:  Nutrition and fluids offered: yes Toileting and hygiene offered: Yes  Sitter present: q15 minute observations and security  monitoring Law enforcement present: Yes  ODS  

## 2018-06-06 DIAGNOSIS — F1029 Alcohol dependence with unspecified alcohol-induced disorder: Secondary | ICD-10-CM

## 2018-06-06 MED ORDER — VALPROIC ACID 250 MG PO CAPS
250.0000 mg | ORAL_CAPSULE | Freq: Every day | ORAL | Status: DC
  Administered 2018-06-06: 250 mg via ORAL
  Filled 2018-06-06: qty 1

## 2018-06-06 MED ORDER — TRAZODONE HCL 50 MG PO TABS: 150.0000 mg | ORAL_TABLET | Freq: Every day | ORAL | Status: DC

## 2018-06-06 NOTE — ED Notes (Signed)
Hourly rounding reveals patient sleeping in room. No complaints, stable, in no acute distress. Q15 minute rounds and monitoring via Security Cameras to continue. 

## 2018-06-06 NOTE — ED Notes (Signed)
Patient discharge home. Patient alert, oriented and ambulatory. Patient denies SI/HI and A/V hallucinations at discharge. Patient vitals 97.0-159/96-90-20-100%. All patient belongings returned to him at discharge. Patient given bus pass to get home. Patient AVS/Follow-up /Medications reviewed with him and and a written copy given to patient and he verbalized understanding.

## 2018-06-06 NOTE — ED Notes (Signed)
Patient is alert, oriented and verbal. Patient denies SI/HI and A/V hallucinations. Patient is calm and cooperative. Patient with Q 15 minute checks in progress and remains safe on unit. Monitoring continues.

## 2018-06-06 NOTE — ED Provider Notes (Signed)
Cleared for d/c by Dr. Luz Lex, MD 06/06/18 [phone]
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
## 2018-06-06 NOTE — ED Notes (Signed)
IVC PAPERS  RESCINDED  PER  DR  CLAPACS  MD  INFORMED  RN  Britta Mccreedy

## 2018-07-10 ENCOUNTER — Other Ambulatory Visit: Payer: Self-pay

## 2018-07-10 ENCOUNTER — Emergency Department: Payer: Medicaid Other

## 2018-07-10 ENCOUNTER — Encounter: Payer: Self-pay | Admitting: *Deleted

## 2018-07-10 ENCOUNTER — Emergency Department
Admission: EM | Admit: 2018-07-10 | Discharge: 2018-07-11 | Disposition: A | Discharge status: Home / Self Care | Payer: Medicaid Other | Attending: Emergency Medicine | Admitting: Emergency Medicine

## 2018-07-10 DIAGNOSIS — F10929 Alcohol use, unspecified with intoxication, unspecified: Secondary | ICD-10-CM

## 2018-07-10 DIAGNOSIS — F17210 Nicotine dependence, cigarettes, uncomplicated: Secondary | ICD-10-CM | POA: Insufficient documentation

## 2018-07-10 DIAGNOSIS — R0600 Dyspnea, unspecified: Secondary | ICD-10-CM | POA: Insufficient documentation

## 2018-07-10 DIAGNOSIS — F10229 Alcohol dependence with intoxication, unspecified: Secondary | ICD-10-CM | POA: Insufficient documentation

## 2018-07-10 DIAGNOSIS — F1410 Cocaine abuse, uncomplicated: Secondary | ICD-10-CM | POA: Insufficient documentation

## 2018-07-10 DIAGNOSIS — F1490 Cocaine use, unspecified, uncomplicated: Secondary | ICD-10-CM

## 2018-07-10 DIAGNOSIS — Z79899 Other long term (current) drug therapy: Secondary | ICD-10-CM | POA: Insufficient documentation

## 2018-07-10 LAB — COMPREHENSIVE METABOLIC PANEL
ALK PHOS: 98 U/L (ref 38–126)
ALT: 45 U/L — AB (ref 0–44)
ANION GAP: 10 (ref 5–15)
AST: 67 U/L — ABNORMAL HIGH (ref 15–41)
Albumin: 3.9 g/dL (ref 3.5–5.0)
BILIRUBIN TOTAL: 0.3 mg/dL (ref 0.3–1.2)
BUN: 8 mg/dL (ref 6–20)
CALCIUM: 8.4 mg/dL — AB (ref 8.9–10.3)
CO2: 21 mmol/L — ABNORMAL LOW (ref 22–32)
CREATININE: 0.64 mg/dL (ref 0.61–1.24)
Chloride: 104 mmol/L (ref 98–111)
Glucose, Bld: 93 mg/dL (ref 70–99)
Potassium: 3.9 mmol/L (ref 3.5–5.1)
Sodium: 135 mmol/L (ref 135–145)
TOTAL PROTEIN: 7.3 g/dL (ref 6.5–8.1)

## 2018-07-10 LAB — CBC
HCT: 43.4 % (ref 39.0–52.0)
Hemoglobin: 14.4 g/dL (ref 13.0–17.0)
MCH: 31.7 pg (ref 26.0–34.0)
MCHC: 33.2 g/dL (ref 30.0–36.0)
MCV: 95.6 fL (ref 80.0–100.0)
Platelets: 212 10*3/uL (ref 150–400)
RBC: 4.54 MIL/uL (ref 4.22–5.81)
RDW: 13.1 % (ref 11.5–15.5)
WBC: 6.8 10*3/uL (ref 4.0–10.5)
nRBC: 0 % (ref 0.0–0.2)

## 2018-07-10 LAB — ETHANOL: Alcohol, Ethyl (B): 262 mg/dL — ABNORMAL HIGH (ref <10)

## 2018-07-10 LAB — TROPONIN I

## 2018-07-10 LAB — MAGNESIUM: Magnesium: 2.1 mg/dL (ref 1.7–2.4)

## 2018-07-10 LAB — LACTIC ACID, PLASMA: Lactic Acid, Venous: 1 mmol/L (ref 0.5–1.9)
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
## 2018-07-10 NOTE — ED Triage Notes (Signed)
Pt brought in by ems from home with seizures.  Pt also reports diff breathing.  Pt reports drinking 2 beers tonight.

## 2018-07-11 LAB — URINE DRUG SCREEN, QUALITATIVE (ARMC ONLY)
AMPHETAMINES, UR SCREEN: NOT DETECTED
Barbiturates, Ur Screen: NOT DETECTED
Benzodiazepine, Ur Scrn: NOT DETECTED
Cannabinoid 50 Ng, Ur ~~LOC~~: NOT DETECTED
Cocaine Metabolite,Ur ~~LOC~~: POSITIVE — AB
MDMA (ECSTASY) UR SCREEN: NOT DETECTED
Methadone Scn, Ur: NOT DETECTED
Opiate, Ur Screen: NOT DETECTED
PHENCYCLIDINE (PCP) UR S: NOT DETECTED
Tricyclic, Ur Screen: NOT DETECTED

## 2018-07-11 LAB — VALPROIC ACID LEVEL: Valproic Acid Lvl: <10 ug/mL — ABNORMAL LOW (ref 50.0–100.0)

## 2018-07-11 LAB — BRAIN NATRIURETIC PEPTIDE: B Natriuretic Peptide: 10 pg/mL (ref 0.0–100.0)

## 2018-07-11 MED ORDER — VALPROIC ACID 250 MG PO CAPS: 750.0000 mg | ORAL_CAPSULE | Freq: Every day | ORAL | 2 refills | Status: DC

## 2018-07-11 MED ORDER — VALPROIC ACID 250 MG PO CAPS
750.0000 mg | ORAL_CAPSULE | Freq: Once | ORAL | Status: AC
  Administered 2018-07-11: 750 mg via ORAL
  Filled 2018-07-11: qty 3

## 2018-07-11 NOTE — ED Notes (Signed)
Pt up to bathroom to void without difficulty  Meds given. .  Pt calm and cooperative

## 2018-07-11 NOTE — ED Notes (Signed)
I attempted contact with pt's legal guardian without success.  md aware.  Discharge pt per dr York Cerise.

## 2018-07-11 NOTE — Discharge Instructions (Signed)
Your workup in the Emergency Department today was reassuring.  We did not find any specific abnormalities other than alcohol intoxication and cocaine use.  There is no evidence you have had any seizures.  We recommend you drink plenty of fluids, take your regular medications and/or any new ones prescribed today, and follow up with the doctor(s) listed in these documents as recommended.  Return to the Emergency Department if you develop new or worsening symptoms that concern you.

## 2018-07-11 NOTE — ED Notes (Signed)
Pt up cussing, asking when his cab is going to be here, Diplomatic Services operational officer calling cab company back to get an estimated time of arrival.

## 2018-07-11 NOTE — ED Notes (Signed)
Report off to stephanie rn  Pt waiting on taxi ride.

## 2018-07-25 ENCOUNTER — Other Ambulatory Visit: Payer: Self-pay

## 2018-07-25 ENCOUNTER — Emergency Department
Admission: EM | Admit: 2018-07-25 | Discharge: 2018-07-26 | Disposition: A | Discharge status: Home / Self Care | Payer: Medicaid Other | Attending: Emergency Medicine | Admitting: Emergency Medicine

## 2018-07-25 ENCOUNTER — Encounter: Payer: Self-pay | Admitting: *Deleted

## 2018-07-25 DIAGNOSIS — R079 Chest pain, unspecified: Secondary | ICD-10-CM | POA: Insufficient documentation

## 2018-07-25 DIAGNOSIS — F1420 Cocaine dependence, uncomplicated: Secondary | ICD-10-CM | POA: Diagnosis present

## 2018-07-25 DIAGNOSIS — F1994 Other psychoactive substance use, unspecified with psychoactive substance-induced mood disorder: Secondary | ICD-10-CM | POA: Diagnosis present

## 2018-07-25 DIAGNOSIS — F1010 Alcohol abuse, uncomplicated: Secondary | ICD-10-CM

## 2018-07-25 DIAGNOSIS — F17210 Nicotine dependence, cigarettes, uncomplicated: Secondary | ICD-10-CM | POA: Insufficient documentation

## 2018-07-25 DIAGNOSIS — F32A Depression, unspecified: Secondary | ICD-10-CM

## 2018-07-25 DIAGNOSIS — Y908 Blood alcohol level of 240 mg/100 ml or more: Secondary | ICD-10-CM | POA: Insufficient documentation

## 2018-07-25 DIAGNOSIS — F1029 Alcohol dependence with unspecified alcohol-induced disorder: Secondary | ICD-10-CM | POA: Diagnosis present

## 2018-07-25 DIAGNOSIS — Z79899 Other long term (current) drug therapy: Secondary | ICD-10-CM | POA: Insufficient documentation

## 2018-07-25 DIAGNOSIS — F329 Major depressive disorder, single episode, unspecified: Secondary | ICD-10-CM | POA: Insufficient documentation

## 2018-07-25 DIAGNOSIS — F1910 Other psychoactive substance abuse, uncomplicated: Secondary | ICD-10-CM | POA: Insufficient documentation

## 2018-07-25 LAB — CBC
HEMATOCRIT: 45.2 % (ref 39.0–52.0)
Hemoglobin: 14.8 g/dL (ref 13.0–17.0)
MCH: 31.7 pg (ref 26.0–34.0)
MCHC: 32.7 g/dL (ref 30.0–36.0)
MCV: 96.8 fL (ref 80.0–100.0)
PLATELETS: 217 10*3/uL (ref 150–400)
RBC: 4.67 MIL/uL (ref 4.22–5.81)
RDW: 13.4 % (ref 11.5–15.5)
WBC: 6.3 10*3/uL (ref 4.0–10.5)
nRBC: 0 % (ref 0.0–0.2)

## 2018-07-25 LAB — URINE DRUG SCREEN, QUALITATIVE (ARMC ONLY)
Amphetamines, Ur Screen: NOT DETECTED
Barbiturates, Ur Screen: NOT DETECTED
Benzodiazepine, Ur Scrn: NOT DETECTED
COCAINE METABOLITE, UR ~~LOC~~: POSITIVE — AB
Cannabinoid 50 Ng, Ur ~~LOC~~: POSITIVE — AB
MDMA (ECSTASY) UR SCREEN: NOT DETECTED
Methadone Scn, Ur: NOT DETECTED
OPIATE, UR SCREEN: NOT DETECTED
PHENCYCLIDINE (PCP) UR S: NOT DETECTED
Tricyclic, Ur Screen: NOT DETECTED

## 2018-07-25 LAB — COMPREHENSIVE METABOLIC PANEL
ALBUMIN: 4 g/dL (ref 3.5–5.0)
ALK PHOS: 94 U/L (ref 38–126)
ALT: 36 U/L (ref 0–44)
AST: 41 U/L (ref 15–41)
Anion gap: 11 (ref 5–15)
BILIRUBIN TOTAL: 0.6 mg/dL (ref 0.3–1.2)
BUN: 9 mg/dL (ref 6–20)
CO2: 23 mmol/L (ref 22–32)
Calcium: 8.6 mg/dL — ABNORMAL LOW (ref 8.9–10.3)
Chloride: 102 mmol/L (ref 98–111)
Creatinine, Ser: 0.66 mg/dL (ref 0.61–1.24)
GFR calc Af Amer: >60 mL/min (ref >60)
GFR calc non Af Amer: >60 mL/min (ref >60)
GLUCOSE: 90 mg/dL (ref 70–99)
Potassium: 3.9 mmol/L (ref 3.5–5.1)
Sodium: 136 mmol/L (ref 135–145)
TOTAL PROTEIN: 7.6 g/dL (ref 6.5–8.1)

## 2018-07-25 LAB — ETHANOL: Alcohol, Ethyl (B): 322 mg/dL (ref <10)

## 2018-07-25 LAB — TROPONIN I

## 2018-07-25 NOTE — ED Notes (Signed)
Report given to Andrea RN

## 2018-07-25 NOTE — ED Notes (Addendum)
Attempted to call pt's legal guardian mother Alex [name]). Message stating that voicebox is full. Unable to leave message at this time.

## 2018-07-25 NOTE — ED Triage Notes (Signed)
Pt brought in by police.  Pt reports he is feeling depressed.  Pt talkative and laughing in triage. Pt reports etoh use and drug use.

## 2018-07-25 NOTE — ED Provider Notes (Signed)
[facility] Emergency Department Provider Note  ____________________________________________   I have reviewed the triage vital signs and the nursing notes.   HISTORY  Chief Complaint Behavior Problem   History limited by: Not Limited   HPI Alex [name] is a 42 y.o. male who presents to the emergency department today because of concerns for substance abuse and depression.  The patient is well-known to the emergency department.  He has frequent emergency department visits.  Patient does admit to alcohol and drug use today.  On exam he states that he would like help with his drug abuse.  He states that he feels depressed because of it.    Per medical record review patient has a history of polysubstance abuse, depression. Frequent emergency department visits for the same.   Past Medical History:  Diagnosis Date  . Alcohol abuse   . Anxiety   . Cocaine abuse (HCC) 05/27/2016  . Depression   . Seizures [facility])     Patient Active Problem List   Diagnosis Date Noted  . Substance induced mood disorder (HCC) 12/30/2016  . Moderate recurrent major depression (HCC) 12/30/2016  . Cocaine abuse (HCC)   . ADHD 08/11/2016  . Alcohol dependence with unspecified alcohol-induced disorder (HCC) 06/28/2016  . Alcohol withdrawal (HCC) 06/28/2016  . Cocaine use disorder, moderate, dependence (HCC) 06/28/2016  . Unspecified Depressive disorder 06/28/2016  . Tobacco use disorder 06/28/2016    Past Surgical History:  Procedure Laterality Date  . FINGER SURGERY      Prior to Admission medications   Medication Sig Start Date End Date Taking? Authorizing Provider  citalopram (CELEXA) 40 MG tablet Take 40 mg by mouth daily. 02/27/18  Yes [provider]  Multiple Vitamin (MULTIVITAMIN) tablet Take 1 tablet by mouth daily.   Yes [provider]  omeprazole (PRILOSEC) 20 MG capsule Take 20 mg by mouth 2 (two) times daily before a meal.   Yes [provider]  valproic acid (DEPAKENE) 250 MG capsule Take 3 capsules (750 mg total) by mouth at bedtime. 07/11/18 07/11/19 Yes Loleta Rose, MD    Allergies Peanut butter flavor  No family history on file.  Social History Social History   Tobacco Use  . Smoking status: Current Every Day Smoker    Packs/day: 1.00    Types: Cigarettes  . Smokeless tobacco: Never Used  Substance Use Topics  . Alcohol use: Yes    Alcohol/week: 4.0 - 5.0 standard drinks    Types: 4 - 5 Cans of beer per week    Comment: Pt states that last drink was 3-4 days ago  . Drug use: Yes    Types: Marijuana, Cocaine    Comment: cocaine last used on friday    Review of Systems Constitutional: No fever/chills Eyes: No visual changes. ENT: No sore throat. Cardiovascular: Positive for chest pain. Respiratory: Denies shortness of breath. Gastrointestinal: No abdominal pain.  No nausea, no vomiting.  No diarrhea.   Genitourinary: Negative for dysuria. Musculoskeletal: Positive for left arm pain.  Skin: Negative for rash. Neurological: Negative for headaches, focal weakness or numbness.  ____________________________________________   PHYSICAL EXAM:  VITAL SIGNS: ED Triage Vitals  Enc Vitals Group     BP 07/25/18 2130 119/67     Pulse Rate 07/25/18 2130 85     Resp 07/25/18 2130 20     Temp 07/25/18 2130 98.1 F (36.7 C)     Temp Source 07/25/18 2130 Oral     SpO2 07/25/18  2130 99 %     Weight 07/25/18 2131 190 lb (86.2 kg)     Height 07/25/18 2131 5\' 10"  (1.778 m)     Head Circumference --      Peak Flow --      Pain Score 07/25/18 2152 8     Pain Loc --      Pain Edu? --      Excl. in GC? --      Constitutional: Alert and oriented.  Eyes: Conjunctivae are normal.  ENT      Head: Normocephalic and atraumatic.      Nose: No congestion/rhinnorhea.      Mouth/Throat: Mucous membranes are moist.      Neck: No stridor. Hematological/Lymphatic/Immunilogical: No cervical  lymphadenopathy. Cardiovascular: Normal rate, regular rhythm.  No murmurs, rubs, or gallops.  Respiratory: Normal respiratory effort without tachypnea nor retractions. Breath sounds are clear and equal bilaterally. No wheezes/rales/rhonchi. Gastrointestinal: Soft and non tender. No rebound. No guarding.  Genitourinary: Deferred Musculoskeletal: Normal range of motion in all extremities. No lower extremity edema. Neurologic:  Normal speech and language. No gross focal neurologic deficits are appreciated.  Skin:  Skin is warm, dry and intact. No rash noted. Psychiatric: Mood and affect are normal. Speech and behavior are normal. Patient exhibits appropriate insight and judgment.  ____________________________________________    LABS (pertinent positives/negatives)  Ethanol 322 Trop <0.03 CMP wnl except ca 8.6 CBC wnl UDS positive for cocaine and cannabinoid  ____________________________________________   EKG  I, Phineas Semen, attending physician, personally viewed and interpreted this EKG  EKG Time: 2235 Rate: 76 Rhythm: normal sinus rhythm Axis:  normal Intervals: qtc 429 QRS: narrow ST changes: no st elevation Impression: normal ekg   ____________________________________________    RADIOLOGY  None  ____________________________________________   PROCEDURES  Procedures  ____________________________________________   INITIAL IMPRESSION / ASSESSMENT AND PLAN / ED COURSE  Pertinent labs & imaging results that were available during my care of the patient were reviewed by me and considered in my medical decision making (see chart for details).   Patient who has history of polysubstance abuse and depression who presents to the emergency department with the same.  Patient was also complaining of some chest pain and left upper arm pain today.  Because of this EKG was obtained which did not show any concerning findings.  Troponin was  ordered.   ____________________________________________   FINAL CLINICAL IMPRESSION(S) / ED DIAGNOSES  Final diagnoses:  Depression, unspecified depression type  Alcohol abuse  Substance abuse (HCC)  Chest pain, unspecified type     Note: This dictation was prepared with Dragon dictation. Any transcriptional errors that result from this process are unintentional     Phineas Semen, MD 07/26/18 1546

## 2018-07-26 DIAGNOSIS — F1994 Other psychoactive substance use, unspecified with psychoactive substance-induced mood disorder: Secondary | ICD-10-CM

## 2018-07-26 MED ORDER — ADULT MULTIVITAMIN W/MINERALS CH
1.0000 | ORAL_TABLET | Freq: Every day | ORAL | Status: DC
  Administered 2018-07-26: 1 via ORAL
  Filled 2018-07-26: qty 1

## 2018-07-26 MED ORDER — PROMETHAZINE HCL 25 MG PO TABS: 25.0000 mg | ORAL_TABLET | Freq: Once | ORAL | Status: DC

## 2018-07-26 MED ORDER — VITAMIN B-1 100 MG PO TABS
100.0000 mg | ORAL_TABLET | Freq: Every day | ORAL | Status: DC
  Administered 2018-07-26: 100 mg via ORAL
  Filled 2018-07-26: qty 1

## 2018-07-26 MED ORDER — PROMETHAZINE HCL 25 MG/ML IJ SOLN
25.0000 mg | Freq: Four times a day (QID) | INTRAMUSCULAR | Status: DC | PRN
  Filled 2018-07-26: qty 1

## 2018-07-26 MED ORDER — FOLIC ACID 1 MG PO TABS
1.0000 mg | ORAL_TABLET | Freq: Every day | ORAL | Status: DC
  Administered 2018-07-26: 1 mg via ORAL
  Filled 2018-07-26: qty 1

## 2018-07-26 MED ORDER — LORAZEPAM 1 MG PO TABS
1.0000 mg | ORAL_TABLET | Freq: Four times a day (QID) | ORAL | Status: DC | PRN
  Administered 2018-07-26: 1 mg via ORAL
  Filled 2018-07-26: qty 1

## 2018-07-26 MED ORDER — PROMETHAZINE HCL 25 MG PO TABS
25.0000 mg | ORAL_TABLET | Freq: Four times a day (QID) | ORAL | Status: DC | PRN
  Administered 2018-07-26: 25 mg via ORAL
  Filled 2018-07-26: qty 1

## 2018-07-26 MED ORDER — LORAZEPAM 2 MG/ML IJ SOLN: 1.0000 mg | Freq: Four times a day (QID) | INTRAMUSCULAR | Status: DC | PRN

## 2018-07-26 MED ORDER — THIAMINE HCL 100 MG/ML IJ SOLN: 100.0000 mg | Freq: Every day | INTRAMUSCULAR | Status: DC

## 2018-07-26 NOTE — ED Notes (Signed)
Pt requesting to be discharged. Pt is voluntary.  EDP made aware.    Maintained on 15 minute checks and observation by security camera for safety.

## 2018-07-26 NOTE — ED Notes (Signed)
RN made 3rd attempt to call legal guardian. No answer. Unable to leave voicemail.

## 2018-07-26 NOTE — ED Notes (Signed)
EDP willing to discharge patient if legal guardian is agreeable to plan.  RN attempted to call pt's legal guardian. No answer. Voicemail not set up.   Maintained on 15 minute checks and observation by security camera for safety.

## 2018-07-26 NOTE — ED Notes (Signed)
Hourly rounding reveals patient sleeping in room. No complaints, stable, in no acute distress. Q15 minute rounds and monitoring via Security Cameras to continue. 

## 2018-07-26 NOTE — ED Notes (Signed)
Pt becoming agitated that his cab is taking longer than anticipated. Pt also upset he is unable to wait in the lobby because he has a legal guardian.    (Pt denies having a guardian, but his mother is his guardian according to Epic).   Maintained on 15 minute checks and observation by security camera for safety.

## 2018-07-26 NOTE — Discharge Instructions (Addendum)
Return to the emergency department immediately for any worsening condition including chest pain, trouble breathing, new or worsening depression or anxiety, any thoughts of wanting to hurt yourself or others.

## 2018-07-26 NOTE — BH Assessment (Signed)
Assessment Note  Alex [name] is an 42 y.o. male who presents to the ER due to calling 911 voicing SI, while intoxicated. Patient further reports, he was drinking and felt he was unsafe. Thus, he was brought to the ER because of concerns for his safety. During the interview, the patient was calm, cooperative and pleasant. Patient's major concern was him getting home without getting wet. He kept asking ER staff for a "Taxi Voucher."  Patient denies SI/HI and AV/H.  Diagnosis: Alcohol Use disorder  Past Medical History:  Past Medical History:  Diagnosis Date  . Alcohol abuse   . Anxiety   . Cocaine abuse (HCC) 05/27/2016  . Depression   . Seizures (HCC)     Past Surgical History:  Procedure Laterality Date  . FINGER SURGERY      Family History: No family history on file.  Social History:  reports that he has been smoking cigarettes. He has been smoking about 1.00 pack per day. He has never used smokeless tobacco. He reports that he drinks about 4.0 - 5.0 standard drinks of alcohol per week. He reports that he has current or past drug history. Drugs: Marijuana and Cocaine.  Additional Social History:  Alcohol / Drug Use Pain Medications: See PTA Prescriptions: See PTA Over the Counter: See PTA History of alcohol / drug use?: Yes Longest period of sobriety (when/how long): Unable to quantify Negative Consequences of Use: Personal relationships, Work / School Withdrawal Symptoms: (Reports of none) Substance #1 Name of Substance 1: Alcohol 1 - Last Use / Amount: 07/25/2018  CIWA: CIWA-Ar BP: (!) 139/101 Pulse Rate: 92 Nausea and Vomiting: 5 Tactile Disturbances: none Tremor: not visible, but can be felt fingertip to fingertip Auditory Disturbances: not present Paroxysmal Sweats: beads of sweat obvious on forehead Visual Disturbances: not present Anxiety: no anxiety, at ease Headache, Fullness in Head: none present Agitation: normal activity Orientation and Clouding of  Sensorium: oriented and can do serial additions CIWA-Ar Total: 10 COWS: Clinical Opiate Withdrawal Scale (COWS) Resting Pulse Rate: Pulse Rate 81-100 Sweating: No report of chills or flushing Restlessness: Able to sit still Pupil Size: Pupils pinned or normal size for room light Bone or Joint Aches: Not present Runny Nose or Tearing: Not present GI Upset: No GI symptoms Tremor: No tremor Yawning: No yawning Anxiety or Irritability: None Gooseflesh Skin: Skin is smooth COWS Total Score: 1  Allergies:  Allergies  Allergen Reactions  . Peanut Butter Flavor     Home Medications:  (Not in a hospital admission)  OB/GYN Status:  No LMP for male patient.  General Assessment Data Location of Assessment: [facility] ED TTS Assessment: In system Is this a Tele or Face-to-Face Assessment?: Face-to-Face Is this an Initial Assessment or a Re-assessment for this encounter?: Initial Assessment Language Other than English: No Living Arrangements: Other (Comment)(Private Home) What gender do you identify as?: Male Marital status: Single Pregnancy Status: No Living Arrangements: Parent Can pt return to current living arrangement?: Yes Admission Status: Voluntary Is patient capable of signing voluntary admission?: Yes Referral Source: Self/Family/Friend Insurance type: Medicaid  Medical Screening Exam [facility] Walk-in ONLY) Medical Exam completed: Yes  Crisis Care Plan Living Arrangements: Parent Legal Guardian: Mother Name of Psychiatrist: Reports of none Name of Therapist: Reports of none  Education Status Is patient currently in school?: No Is the patient employed, unemployed or receiving disability?: Unemployed, Receiving disability income  Risk to self with the past 6 months Suicidal Ideation: No-Not Currently/Within Last 6 Months Has patient  been a risk to self within the past 6 months prior to admission? : No Suicidal Intent: No Has patient had any suicidal intent within the past  6 months prior to admission? : No Is patient at risk for suicide?: No Suicidal Plan?: No Has patient had any suicidal plan within the past 6 months prior to admission? : No Access to Means: No What has been your use of drugs/alcohol within the last 12 months?: Alcohol Previous Attempts/Gestures: No How many times?: 0 Other Self Harm Risks: Active alcohol use Triggers for Past Attempts: None known Intentional Self Injurious Behavior: None Family Suicide History: No Recent stressful life event(s): Other (Comment) Persecutory voices/beliefs?: No Depression: Yes Depression Symptoms: Guilt Substance abuse history and/or treatment for substance abuse?: Yes Suicide prevention information given to non-admitted patients: Not applicable  Risk to Others within the past 6 months Homicidal Ideation: No Does patient have any lifetime risk of violence toward others beyond the six months prior to admission? : No Thoughts of Harm to Others: No Current Homicidal Intent: No Current Homicidal Plan: No Access to Homicidal Means: No Identified Victim: Reports of none History of harm to others?: No Assessment of Violence: None Noted Violent Behavior Description: Reports of none Does patient have access to weapons?: No Criminal Charges Pending?: No Does patient have a court date: No Is patient on probation?: No  Psychosis Hallucinations: None noted Delusions: None noted  Mental Status Report Appearance/Hygiene: Unremarkable, In scrubs Eye Contact: Good Motor Activity: Freedom of movement, Unremarkable Speech: Logical/coherent, Unremarkable Level of Consciousness: Alert Mood: Anxious, Pleasant Affect: Appropriate to circumstance Anxiety Level: Minimal Thought Processes: Coherent, Relevant Judgement: Unimpaired Orientation: Person, Place, Time, Situation, Appropriate for developmental age Obsessive Compulsive Thoughts/Behaviors: Minimal  Cognitive Functioning Concentration:  Normal Memory: Recent Intact, Remote Intact Is patient IDD: No Insight: Fair Impulse Control: Fair Appetite: Good Have you had any weight changes? : No Change Sleep: No Change Total Hours of Sleep: 8 Vegetative Symptoms: None  ADLScreening [facility] Assessment Services) Patient's cognitive ability adequate to safely complete daily activities?: Yes Patient able to express need for assistance with ADLs?: Yes Independently performs ADLs?: Yes (appropriate for developmental age)  Prior Inpatient Therapy Prior Inpatient Therapy: Yes Prior Therapy Dates: Multiple Hospitalizations Prior Therapy Facilty/Provider(s): [facility] Reason for Treatment: Depression & Substance Abuse Treatment  Prior Outpatient Therapy Prior Outpatient Therapy: No  ADL Screening (condition at time of admission) Patient's cognitive ability adequate to safely complete daily activities?: Yes Is the patient deaf or have difficulty hearing?: No Does the patient have difficulty seeing, even when wearing glasses/contacts?: No Does the patient have difficulty concentrating, remembering, or making decisions?: No Patient able to express need for assistance with ADLs?: Yes Does the patient have difficulty dressing or bathing?: No Independently performs ADLs?: Yes (appropriate for developmental age) Does the patient have difficulty walking or climbing stairs?: No Weakness of Legs: None Weakness of Arms/Hands: None  Home Assistive Devices/Equipment Home Assistive Devices/Equipment: None  Therapy Consults (therapy consults require a physician order) PT Evaluation Needed: No OT Evalulation Needed: No SLP Evaluation Needed: No Abuse/Neglect Assessment (Assessment to be complete while patient is alone) Abuse/Neglect Assessment Can Be Completed: Yes Physical Abuse: Denies Verbal Abuse: Denies Sexual Abuse: Denies Exploitation of patient/patient's resources: Denies Self-Neglect: Denies Values / Beliefs Cultural Requests  During Hospitalization: None Spiritual Requests During Hospitalization: None Consults Spiritual Care Consult Needed: No Social Work Consult Needed: No            Disposition:  Disposition Initial Assessment  Completed for this Encounter: Yes  On Site Evaluation by:   Reviewed with Physician:    Morley Kos 07/26/2018 2:21 PM

## 2018-07-26 NOTE — Consult Note (Signed)
[facility] Consult   Reason for Consult: Consult for this 42 year old man with a history of alcohol and cocaine abuse Referring Physician: Reita Cliche Patient Identification: CASS VANDERMEULEN MRN:  [id number] Principal Diagnosis: Substance induced mood disorder [facility]) Diagnosis:   Patient Active Problem List   Diagnosis Date Noted  . Substance induced mood disorder ([place]) [F19.94] 12/30/2016  . Moderate recurrent major depression ([place]) [F33.1] 12/30/2016  . Cocaine abuse ([place]) [F14.10]   . ADHD [F90.9] 08/11/2016  . Alcohol dependence with unspecified alcohol-induced disorder ([place]) [F10.29] 06/28/2016  . Alcohol withdrawal ([place]) [F10.239] 06/28/2016  . Cocaine use disorder, moderate, dependence ([place]) [F14.20] 06/28/2016  . Unspecified Depressive disorder [F32.9] 06/28/2016  . Tobacco use disorder [F17.200] 06/28/2016    Total Time spent with patient: 1 hour  Subjective:   GAIGE SEBO is a 42 y.o. male patient admitted with "I guess I am okay now".  HPI: Patient seen chart reviewed.  Patient well known from many previous encounters.  Patient came to the emergency room last night very intoxicated with a blood alcohol level well over 300.  Making statements about being suicidal.  He was not put under commitment but was allowed to rest and sleep it off.  On reevaluation today the patient denies any suicidal thoughts at all.  Admits that he continues to drink heavily pretty much every day.  Does not sound like he is making much of an effort to change it.  Admits that he uses cocaine pretty frequently.  Mood stays anxious and irritable much of the time.  Sleep is erratic as usual.  Does not report any frankly psychotic symptoms and denies suicidality.  Social history: Lives with his mother in [place].  Does not work.  Medical history: No significant medical problems.  Largely so far free of major complications from alcohol abuse.  Substance abuse history: Heavy alcohol abuse  regularly but no history of seizures or delirium tremens.  Cocaine abuse as well.  History of noncompliance with recommended treatment.  Past Psychiatric History: Patient has had a great many visits to our emergency room under similar circumstances.  He has done some self injury in the past but has not made serious suicide attempts.  Usually he sobers up back to a condition of being somewhat jolly.  I do not know that we have any testing but the patient always seems to be of limited cognitive abilities.  Risk to Self: Suicidal Ideation: No-Not Currently/Within Last 6 Months Suicidal Intent: No Is patient at risk for suicide?: No Suicidal Plan?: No Access to Means: No What has been your use of drugs/alcohol within the last 12 months?: Alcohol How many times?: 0 Other Self Harm Risks: Active alcohol use Triggers for Past Attempts: None known Intentional Self Injurious Behavior: None Risk to Others: Homicidal Ideation: No Thoughts of Harm to Others: No Current Homicidal Intent: No Current Homicidal Plan: No Access to Homicidal Means: No Identified Victim: Reports of none History of harm to others?: No Assessment of Violence: None Noted Violent Behavior Description: Reports of none Does patient have access to weapons?: No Criminal Charges Pending?: No Does patient have a court date: No Prior Inpatient Therapy: Prior Inpatient Therapy: Yes Prior Therapy Dates: Multiple Hospitalizations Prior Therapy Facilty/Provider(s): [facility] Reason for Treatment: Depression & Substance Abuse Treatment Prior Outpatient Therapy: Prior Outpatient Therapy: No  Past Medical History:  Past Medical History:  Diagnosis Date  . Alcohol abuse   . Anxiety   . Cocaine abuse ([place]) 05/27/2016  .  Depression   . Seizures ([place])     Past Surgical History:  Procedure Laterality Date  . FINGER SURGERY     Family History: No family history on file. Family Psychiatric  History: None known Social History:   Social History   Substance and Sexual Activity  Alcohol Use Yes  . Alcohol/week: 4.0 - 5.0 standard drinks  . Types: 4 - 5 Cans of beer per week   Comment: Pt states that last drink was 3-4 days ago     Social History   Substance and Sexual Activity  Drug Use Yes  . Types: Marijuana, Cocaine   Comment: cocaine last used on friday    Social History   Socioeconomic History  . Marital status: Single    Spouse name: Not on file  . Number of children: Not on file  . Years of education: Not on file  . Highest education level: Not on file  Occupational History  . Not on file  Social Needs  . Financial resource strain: Not on file  . Food insecurity:    Worry: Not on file    Inability: Not on file  . Transportation needs:    Medical: Not on file    Non-medical: Not on file  Tobacco Use  . Smoking status: Current Every Day Smoker    Packs/day: 1.00    Types: Cigarettes  . Smokeless tobacco: Never Used  Substance and Sexual Activity  . Alcohol use: Yes    Alcohol/week: 4.0 - 5.0 standard drinks    Types: 4 - 5 Cans of beer per week    Comment: Pt states that last drink was 3-4 days ago  . Drug use: Yes    Types: Marijuana, Cocaine    Comment: cocaine last used on friday  . Sexual activity: Not on file  Lifestyle  . Physical activity:    Days per week: Not on file    Minutes per session: Not on file  . Stress: Not on file  Relationships  . Social connections:    Talks on phone: Not on file    Gets together: Not on file    Attends religious service: Not on file    Active member of club or organization: Not on file    Attends meetings of clubs or organizations: Not on file    Relationship status: Not on file  Other Topics Concern  . Not on file  Social History Narrative  . Not on file   Additional Social History:    Allergies:   Allergies  Allergen Reactions  . Peanut Butter Flavor     Labs:  Results for orders placed or performed during the hospital  encounter of 07/25/18 (from the past 48 hour(s))  Comprehensive metabolic panel     Status: Abnormal   Collection Time: 07/25/18  9:34 PM  Result Value Ref Range   Sodium 136 135 - 145 mmol/L   Potassium 3.9 3.5 - 5.1 mmol/L   Chloride 102 98 - 111 mmol/L   CO2 23 22 - 32 mmol/L   Glucose, Bld 90 70 - 99 mg/dL   BUN 9 6 - 20 mg/dL   Creatinine, Ser 0.66 0.61 - 1.24 mg/dL   Calcium 8.6 (L) 8.9 - 10.3 mg/dL   Total Protein 7.6 6.5 - 8.1 g/dL   Albumin 4.0 3.5 - 5.0 g/dL   AST 41 15 - 41 U/L   ALT 36 0 - 44 U/L   Alkaline Phosphatase 94 38 - 126  U/L   Total Bilirubin 0.6 0.3 - 1.2 mg/dL   GFR calc non Af Amer >60 >60 mL/min   GFR calc Af Amer >60 >60 mL/min    Comment: (NOTE) The eGFR has been calculated using the CKD EPI equation. This calculation has not been validated in all clinical situations. eGFR's persistently <60 mL/min signify possible Chronic Kidney Disease.    Anion gap 11 5 - 15    Comment: Performed at [facility], [address] [place], [place] [address]  Ethanol     Status: Abnormal   Collection Time: 07/25/18  9:34 PM  Result Value Ref Range   Alcohol, Ethyl (B) 322 (HH) <10 mg/dL    Comment: CRITICAL RESULT CALLED TO, READ BACK BY AND VERIFIED WITH HENRY RIVERA @2233  07/25/18 [facility] (NOTE) Lowest detectable limit for serum alcohol is 10 mg/dL. For medical purposes only. Performed at [facility], [address] [place], [place] [address]   cbc     Status: None   Collection Time: 07/25/18  9:34 PM  Result Value Ref Range   WBC 6.3 4.0 - 10.5 K/uL   RBC 4.67 4.22 - 5.81 MIL/uL   Hemoglobin 14.8 13.0 - 17.0 g/dL   HCT 45.2 39.0 - 52.0 %   MCV 96.8 80.0 - 100.0 fL   MCH 31.7 26.0 - 34.0 pg   MCHC 32.7 30.0 - 36.0 g/dL   RDW 13.4 11.5 - 15.5 %   Platelets 217 150 - 400 K/uL   nRBC 0.0 0.0 - 0.2 %    Comment: Performed at [facility], [address], [place], [place] [address]  Urine Drug Screen, Qualitative     Status:  Abnormal   Collection Time: 07/25/18  9:34 PM  Result Value Ref Range   Tricyclic, Ur Screen NONE DETECTED NONE DETECTED   Amphetamines, Ur Screen NONE DETECTED NONE DETECTED   MDMA (Ecstasy)Ur Screen NONE DETECTED NONE DETECTED   Cocaine Metabolite,Ur [place] POSITIVE (A) NONE DETECTED   Opiate, Ur Screen NONE DETECTED NONE DETECTED   Phencyclidine (PCP) Ur S NONE DETECTED NONE DETECTED   Cannabinoid 50 Ng, Ur [place] POSITIVE (A) NONE DETECTED   Barbiturates, Ur Screen NONE DETECTED NONE DETECTED   Benzodiazepine, Ur Scrn NONE DETECTED NONE DETECTED   Methadone Scn, Ur NONE DETECTED NONE DETECTED    Comment: (NOTE) Tricyclics + metabolites, urine    Cutoff 1000 ng/mL Amphetamines + metabolites, urine  Cutoff 1000 ng/mL MDMA (Ecstasy), urine              Cutoff 500 ng/mL Cocaine Metabolite, urine          Cutoff 300 ng/mL Opiate + metabolites, urine        Cutoff 300 ng/mL Phencyclidine (PCP), urine         Cutoff 25 ng/mL Cannabinoid, urine                 Cutoff 50 ng/mL Barbiturates + metabolites, urine  Cutoff 200 ng/mL Benzodiazepine, urine              Cutoff 200 ng/mL Methadone, urine                   Cutoff 300 ng/mL The urine drug screen provides only a preliminary, unconfirmed analytical test result and should not be used for non-medical purposes. Clinical consideration and professional judgment should be applied to any positive drug screen result due to possible interfering substances. A more specific alternate chemical method must  be used in order to obtain a confirmed analytical result. Gas chromatography / mass spectrometry (GC/MS) is the preferred confirmat ory method. Performed at [facility], [address] [place], [place] [address]   Troponin I     Status: None   Collection Time: 07/25/18  9:34 PM  Result Value Ref Range   Troponin I <0.03 <0.03 ng/mL    Comment: Performed at [facility], [address] [place], [place] [address]     Current Facility-Administered Medications  Medication Dose Route Frequency Provider Last Rate Last Dose  . folic acid (FOLVITE) tablet 1 mg  1 mg Oral Daily Garan Frappier, Madie Reno, MD   1 mg at 07/26/18 0946  . LORazepam (ATIVAN) tablet 1 mg  1 mg Oral Q6H PRN Peni Rupard, Madie Reno, MD   1 mg at 07/26/18 0946   Or  . LORazepam (ATIVAN) injection 1 mg  1 mg Intravenous Q6H PRN Aleene Swanner T, MD      . multivitamin with minerals tablet 1 tablet  1 tablet Oral Daily Dantavious Snowball, Madie Reno, MD   1 tablet at 07/26/18 0947  . promethazine (PHENERGAN) injection 25 mg  25 mg Intramuscular Q6H PRN Johnchristopher Sarvis T, MD      . promethazine (PHENERGAN) tablet 25 mg  25 mg Oral Q6H PRN Lidya Mccalister, Madie Reno, MD   25 mg at 07/26/18 0946  . thiamine (VITAMIN B-1) tablet 100 mg  100 mg Oral Daily Jerrianne Hartin T, MD   100 mg at 07/26/18 0946   Or  . thiamine (B-1) injection 100 mg  100 mg Intravenous Daily Sanjana Folz, Madie Reno, MD       Current Outpatient Medications  Medication Sig Dispense Refill  . citalopram (CELEXA) 40 MG tablet Take 40 mg by mouth daily.  5  . Multiple Vitamin (MULTIVITAMIN) tablet Take 1 tablet by mouth daily.    Marland Kitchen omeprazole (PRILOSEC) 20 MG capsule Take 20 mg by mouth 2 (two) times daily before a meal.    . valproic acid (DEPAKENE) 250 MG capsule Take 3 capsules (750 mg total) by mouth at bedtime. 90 capsule 2    Musculoskeletal: Strength & Muscle Tone: within normal limits Gait & Station: normal Patient leans: N/A  Psychiatric Specialty Exam: Physical Exam  Nursing note and vitals reviewed. Constitutional: He appears well-developed and well-nourished.  HENT:  Head: Normocephalic and atraumatic.  Eyes: Pupils are equal, round, and reactive to light. Conjunctivae are normal.  Neck: Normal range of motion.  Cardiovascular: Normal heart sounds.  Respiratory: Effort normal.  GI: Soft.  Musculoskeletal: Normal range of motion.  Neurological: He is alert.  Skin: Skin is warm and dry.   Psychiatric: His mood appears anxious. His speech is tangential. He is agitated. He is not aggressive. Thought content is not paranoid. He expresses impulsivity. He expresses no homicidal and no suicidal ideation. He exhibits abnormal recent memory.    Review of Systems  Constitutional: Negative.   HENT: Negative.   Eyes: Negative.   Respiratory: Negative.   Cardiovascular: Negative.   Gastrointestinal: Negative.   Musculoskeletal: Negative.   Skin: Negative.   Neurological: Negative.   Psychiatric/Behavioral: Positive for substance abuse. Negative for depression, hallucinations, memory loss and suicidal ideas. The patient is not nervous/anxious and does not have insomnia.     Blood pressure (!) 139/101, pulse 92, temperature 98 F (36.7 C), temperature source Oral, resp. rate 18, height 5' 10"  (1.778 m), weight 86.2 kg, SpO2 99 %.Body mass index is 27.26  kg/m.  General Appearance: Disheveled  Eye Contact:  Fair  Speech:  Slow  Volume:  Decreased  Mood:  Euthymic  Affect:  Congruent  Thought Process:  Goal Directed  Orientation:  Full (Time, Place, and Person)  Thought Content:  Logical  Suicidal Thoughts:  No  Homicidal Thoughts:  No  Memory:  Immediate;   Fair Recent;   Fair Remote;   Fair  Judgement:  Impaired  Insight:  Shallow  Psychomotor Activity:  Normal  Concentration:  Concentration: Poor  Recall:  [place] of Knowledge:  Poor  Language:  Fair  Akathisia:  No  Handed:  Right  AIMS (if indicated):     Assets:  Housing Physical Health Social Support  ADL's:  Intact  Cognition:  Impaired,  Mild  Sleep:        Treatment Plan Summary: Plan Patient appears to be back to his mental health baseline.  He is euthymic laughing and cracking jokes denying any suicidal thoughts.  Not delirious.  No sign of serious alcohol withdrawal.  There is no point in committing him and no need to admit him to the hospital.  Psychoeducation and counseling completed.  Case  reviewed with emergency room doctor and TTS.  Patient can be discharged with recommended follow-up at [facility]  Disposition: No evidence of imminent risk to self or others at present.   Patient does not meet criteria for psychiatric inpatient admission. Supportive therapy provided about ongoing stressors.  Alethia Berthold, MD 07/26/2018 3:39 PM

## 2018-07-26 NOTE — ED Notes (Signed)
Hourly rounding reveals patient sleeping n room. No complaints, stable, in no acute distress. Q15 minute rounds and monitoring via Security Cameras to continue. 

## 2018-07-26 NOTE — ED Notes (Signed)
Pt. Transferred to BHU from ED to room 5 after screening for contraband. Report to include Situation, Background, Assessment and Recommendations from [facility]. Pt. Oriented to unit including Q15 minute rounds as well as the security cameras for their protection. Patient is alert and oriented, warm and dry in no acute distress. Patient denies HI and AVH but states he has SI without plan. Pt. Encouraged to let me know if needs arise.

## 2018-07-26 NOTE — ED Provider Notes (Addendum)
Vitals:   07/25/18 2130 07/25/18 2153  BP: 119/67 119/67  Pulse: 85 85  Resp: 20   Temp: 98.1 F (36.7 C)   SpO2: 99%    No acute events reported to me overnight per nursing or physician report.  Patient is voluntary for evaluation.  He has a long-standing history of alcohol and drug abuse as well as depression.  No reported indicators for involuntary commitment this point time.  I noticed that the TTS consult had not been placed yet so I did go ahead and place that.  I did review his laboratory studies and does show alcohol level of 322 as well as urine drug screen positive for cocaine as well as cannabinoids.     Governor Rooks, MD 07/26/18 [phone]   Around 1130, nurse let me know that the patient was requesting to discharge home.  I was able to go speak with him, he is not having any ongoing suicidal homicidal ideation.  No clinical intoxication.  Patient states he does not want any additional evaluation with TTS or psychiatry today.  No psychosis or other indication for need for involuntary commitment.  We will discharge home with info and referral numbers for [facility].   Governor Rooks, MD 07/26/18 [phone]  BHU nurse to get in touch with mom who is his legal guardian     Governor Rooks, MD 07/26/18 1208

## 2018-07-26 NOTE — ED Notes (Signed)
Pt sweating and stating he feels lightheaded.  VS taken. RN will notify EDP.   Maintained on 15 minute checks and observation by security camera for safety.

## 2018-07-26 NOTE — ED Notes (Signed)
RN attempted again to call legal guardian.  No answer. No voicemail set up.

## 2018-07-26 NOTE — ED Notes (Signed)
Pt discharged home in cab (given voucher). VS stable.  Pt denies SI/HI. RN attempted to contact legal guardian 3 times (documented).  All belongings returned to patient. Discharge paperwork reviewed with patient. Pt signed for discharge.

## 2018-07-26 NOTE — ED Notes (Signed)
Pt vomited in bathroom

## 2018-07-26 NOTE — ED Notes (Signed)
RN contacted charge nurse about pt requesting cab voucher.

## 2018-07-26 NOTE — ED Notes (Signed)
RN called [name] [employer]. Pt waiting in BHU for cab to arrive.   Maintained on 15 minute checks and observation by security camera for safety.

## 2018-07-26 NOTE — ED Notes (Signed)
Pt given meal tray.

## 2018-07-26 NOTE — ED Notes (Signed)
Pt preoccupied with getting a cab voucher upon discharge.  Pt pacing in dayroom.  Maintained on 15 minute checks and observation by security camera for safety.

## 2018-09-05 ENCOUNTER — Other Ambulatory Visit: Payer: Self-pay

## 2018-09-05 ENCOUNTER — Emergency Department
Admission: EM | Admit: 2018-09-05 | Discharge: 2018-09-06 | Disposition: A | Discharge status: Home / Self Care | Payer: Medicaid Other | Attending: Emergency Medicine | Admitting: Emergency Medicine

## 2018-09-05 ENCOUNTER — Encounter: Payer: Self-pay | Admitting: Emergency Medicine

## 2018-09-05 DIAGNOSIS — F10929 Alcohol use, unspecified with intoxication, unspecified: Secondary | ICD-10-CM

## 2018-09-05 DIAGNOSIS — F10129 Alcohol abuse with intoxication, unspecified: Secondary | ICD-10-CM | POA: Insufficient documentation

## 2018-09-05 DIAGNOSIS — F17210 Nicotine dependence, cigarettes, uncomplicated: Secondary | ICD-10-CM | POA: Insufficient documentation

## 2018-09-05 DIAGNOSIS — F331 Major depressive disorder, recurrent, moderate: Secondary | ICD-10-CM | POA: Insufficient documentation

## 2018-09-05 DIAGNOSIS — Z79899 Other long term (current) drug therapy: Secondary | ICD-10-CM | POA: Insufficient documentation

## 2018-09-05 DIAGNOSIS — R45851 Suicidal ideations: Secondary | ICD-10-CM | POA: Insufficient documentation

## 2018-09-05 LAB — VALPROIC ACID LEVEL: Valproic Acid Lvl: 27 ug/mL — ABNORMAL LOW (ref 50.0–100.0)

## 2018-09-05 LAB — URINE DRUG SCREEN, QUALITATIVE (ARMC ONLY)
AMPHETAMINES, UR SCREEN: NOT DETECTED
Barbiturates, Ur Screen: NOT DETECTED
Benzodiazepine, Ur Scrn: NOT DETECTED
CANNABINOID 50 NG, UR ~~LOC~~: POSITIVE — AB
Cocaine Metabolite,Ur ~~LOC~~: POSITIVE — AB
MDMA (ECSTASY) UR SCREEN: NOT DETECTED
Methadone Scn, Ur: NOT DETECTED
Opiate, Ur Screen: NOT DETECTED
Phencyclidine (PCP) Ur S: NOT DETECTED
TRICYCLIC, UR SCREEN: NOT DETECTED

## 2018-09-05 LAB — COMPREHENSIVE METABOLIC PANEL
ALBUMIN: 4.1 g/dL (ref 3.5–5.0)
ALK PHOS: 110 U/L (ref 38–126)
ALT: 73 U/L — AB (ref 0–44)
AST: 72 U/L — ABNORMAL HIGH (ref 15–41)
Anion gap: 10 (ref 5–15)
BUN: 12 mg/dL (ref 6–20)
CALCIUM: 8.8 mg/dL — AB (ref 8.9–10.3)
CO2: 26 mmol/L (ref 22–32)
CREATININE: 0.79 mg/dL (ref 0.61–1.24)
Chloride: 103 mmol/L (ref 98–111)
GFR calc Af Amer: >60 mL/min (ref >60)
GFR calc non Af Amer: >60 mL/min (ref >60)
GLUCOSE: 110 mg/dL — AB (ref 70–99)
Potassium: 3.7 mmol/L (ref 3.5–5.1)
SODIUM: 139 mmol/L (ref 135–145)
Total Bilirubin: 0.6 mg/dL (ref 0.3–1.2)
Total Protein: 7.9 g/dL (ref 6.5–8.1)

## 2018-09-05 LAB — ETHANOL: Alcohol, Ethyl (B): 275 mg/dL — ABNORMAL HIGH (ref <10)

## 2018-09-05 LAB — CBC
HCT: 47 % (ref 39.0–52.0)
HEMOGLOBIN: 15.5 g/dL (ref 13.0–17.0)
MCH: 30.8 pg (ref 26.0–34.0)
MCHC: 33 g/dL (ref 30.0–36.0)
MCV: 93.4 fL (ref 80.0–100.0)
Platelets: 198 10*3/uL (ref 150–400)
RBC: 5.03 MIL/uL (ref 4.22–5.81)
RDW: 14.6 % (ref 11.5–15.5)
WBC: 6.3 10*3/uL (ref 4.0–10.5)
nRBC: 0 % (ref 0.0–0.2)

## 2018-09-05 LAB — ACETAMINOPHEN LEVEL

## 2018-09-05 LAB — SALICYLATE LEVEL: Salicylate Lvl: <7 mg/dL (ref 2.8–30.0)

## 2018-09-05 NOTE — ED Notes (Signed)
Pt. Transferred from Triage to room 20 after dressing out and screening for contraband.  Pt. Oriented to [place] including Q15 minute rounds as well as Psychologist, counsellingover and Officer for their protection. Patient is alert and oriented, warm and dry in no acute distress. Patient reported SI, and AVH. Pt. Encouraged to let me know if needs arise.

## 2018-09-05 NOTE — ED Notes (Signed)
Pt. Transferred to BHU from ED to room 4 after screening for contraband. Report to include Situation, Background, [facility] Pt. Oriented to unit including Q15 minute rounds as well as the security cameras for their protection. Patient is alert and oriented, warm and dry in no acute distress. Patient denies AVH but states he has SI without plan and wants to hurt people in general. Pt. Encouraged to let me know if needs arise.

## 2018-09-05 NOTE — ED Notes (Signed)
Pt removed black shoes, socks, red sweatshirt, black sweatpants, blue t-shirt and cell phone placed in labeled pt belonging bag to be secured on nursing unit; pt dressed in [place] paper scrubs

## 2018-09-05 NOTE — ED Provider Notes (Signed)
[facility] Emergency Department Provider Note  ____________________________________________  Time seen: Approximately 10:26 PM  I have reviewed the triage vital signs and the nursing notes.   HISTORY  Chief Complaint Mental Health Problem  Level 5 Caveat: Portions of the History and Physical including HPI and review of systems are unable to be completely obtained due to patient being a poor historian due to intoxication   HPI Alex [name] is a 42 y.o. male with a history of depression anxiety and polysubstance abuse  is brought to the ED today by police reporting depression and suicidal thoughts.  He has been drinking today as well.  He reports that he has not had his medications in a few months and has been feeling suicidal for the past month.  Denies any HI or hallucinations.  Denies any other complaints.  He specifically mentions that he is worried about his safety if he leaves the emergency room.  He feels very safe here     Past Medical History:  Diagnosis Date  . Alcohol abuse   . Anxiety   . Cocaine abuse (HCC) 05/27/2016  . Depression   . Seizures [facility](HCC)      Patient Active Problem List   Diagnosis Date Noted  . Substance induced mood disorder (HCC) 12/30/2016  . Moderate recurrent major depression (HCC) 12/30/2016  . Cocaine abuse (HCC)   . ADHD 08/11/2016  . Alcohol dependence with unspecified alcohol-induced disorder (HCC) 06/28/2016  . Alcohol withdrawal (HCC) 06/28/2016  . Cocaine use disorder, moderate, dependence (HCC) 06/28/2016  . Unspecified Depressive disorder 06/28/2016  . Tobacco use disorder 06/28/2016     Past Surgical History:  Procedure Laterality Date  . FINGER SURGERY       Prior to Admission medications   Medication Sig Start Date End Date Taking? Authorizing Provider  citalopram (CELEXA) 40 MG tablet Take 40 mg by mouth daily. 02/27/18   [provider]  Multiple Vitamin (MULTIVITAMIN) tablet Take 1  tablet by mouth daily.    [provider]  omeprazole (PRILOSEC) 20 MG capsule Take 20 mg by mouth 2 (two) times daily before a meal.    [provider]  valproic acid (DEPAKENE) 250 MG capsule Take 3 capsules (750 mg total) by mouth at bedtime. 07/11/18 07/11/19  [name], Cory, MD     Allergies Peanut butter flavor   No family history on file.  Social History Social History   Tobacco Use  . Smoking status: Current Every Day Smoker    Packs/day: 1.00    Types: Cigarettes  . Smokeless tobacco: Never Used  Substance Use Topics  . Alcohol use: Yes    Alcohol/week: 4.0 - 5.0 standard drinks    Types: 4 - 5 Cans of beer per week    Comment: Pt states that last drink was 3-4 days ago  . Drug use: Yes    Types: Marijuana, Cocaine    Comment: cocaine last used on friday    Review of Systems  Constitutional:   No fever or chills.  ENT:   No sore throat. No rhinorrhea. Cardiovascular:   No chest pain or syncope. Respiratory:   No dyspnea or cough. Gastrointestinal:   Negative for abdominal pain, vomiting and diarrhea.  Musculoskeletal:   Negative for focal pain or swelling All other systems reviewed and are negative except as documented above in ROS and HPI.  ____________________________________________   PHYSICAL EXAM:  VITAL SIGNS: ED Triage Vitals  Enc Vitals Group  BP 09/05/18 2039 131/89     Pulse Rate 09/05/18 2039 (!) 120     Resp 09/05/18 2039 18     Temp 09/05/18 2039 98.4 F (36.9 C)     Temp Source 09/05/18 2039 Oral     SpO2 09/05/18 2039 97 %     Weight 09/05/18 2038 223 lb (101.2 kg)     Height 09/05/18 2038 5\' 6"  (1.676 m)     Head Circumference --      Peak Flow --      Pain Score 09/05/18 2038 0     Pain Loc --      Pain Edu? --      Excl. in GC? --     Vital signs reviewed, nursing assessments reviewed.   Constitutional:   Alert and oriented. Non-toxic appearance. Eyes:   Conjunctivae are normal. EOMI. PERRL. ENT       Head:   Normocephalic and atraumatic.      Nose:   No congestion/rhinnorhea.       Mouth/Throat:   MMM, no pharyngeal erythema. No peritonsillar mass.       Neck:   No meningismus. Full ROM. Hematological/Lymphatic/Immunilogical:   No cervical lymphadenopathy. Cardiovascular:   RRR. Symmetric bilateral radial and DP pulses.  No murmurs. Cap refill less than 2 seconds. Respiratory:   Normal respiratory effort without tachypnea/retractions. Breath sounds are clear and equal bilaterally. No wheezes/rales/rhonchi. Gastrointestinal:   Soft and nontender. Non distended. There is no CVA tenderness.  No rebound, rigidity, or guarding.  Musculoskeletal:   Normal range of motion in all extremities. No joint effusions.  No lower extremity tenderness.  No edema.  No wounds Neurologic:   Slurred speech, normal language.  Motor grossly intact. No acute focal neurologic deficits are appreciated.  Skin:    Skin is warm, dry and intact. No rash noted.  No petechiae, purpura, or bullae.  ____________________________________________    LABS (pertinent positives/negatives) (all labs ordered are listed, but only abnormal results are displayed) Labs Reviewed  COMPREHENSIVE METABOLIC PANEL - Abnormal; Notable for the following components:      Result Value   Glucose, Bld 110 (*)    Calcium 8.8 (*)    AST 72 (*)    ALT 73 (*)    All other components within normal limits  ETHANOL - Abnormal; Notable for the following components:   Alcohol, Ethyl (B) 275 (*)    All other components within normal limits  ACETAMINOPHEN LEVEL - Abnormal; Notable for the following components:   Acetaminophen (Tylenol), Serum <10 (*)    All other components within normal limits  URINE DRUG SCREEN, QUALITATIVE ([facility] ONLY) - Abnormal; Notable for the following components:   Cocaine Metabolite,Ur [place] POSITIVE (*)    Cannabinoid 50 Ng, Ur [place] POSITIVE (*)    All other components within normal limits  SALICYLATE LEVEL  CBC   VALPROIC ACID LEVEL   ____________________________________________   EKG    ____________________________________________    RADIOLOGY  No results found.  ____________________________________________   PROCEDURES Procedures  ____________________________________________    CLINICAL IMPRESSION / ASSESSMENT AND PLAN / ED COURSE  Pertinent labs & imaging results that were available during my care of the patient were reviewed by me and considered in my medical decision making (see chart for details).      Clinical Course as of Sep 05 2225  Tue Sep 05, 2018  2120 Medically stable, intoxicated.  Likely an alcohol induced mood disorder as has been the  case in the past for him.  Retain the patient in the ED until sober to reassess his psychiatric symptoms at that time.   [PS]    Clinical Course User Index [PS] Sharman Cheek, MD     ____________________________________________   FINAL CLINICAL IMPRESSION(S) / ED DIAGNOSES    Final diagnoses:  Alcoholic intoxication with complication (HCC)  Verbalizes suicidal thoughts     ED Discharge Orders    None      Portions of this note were generated with dragon dictation software. Dictation errors may occur despite best attempts at proofreading.    Sharman Cheek, MD 09/05/18 2229

## 2018-09-05 NOTE — ED Triage Notes (Signed)
Patient ambulatory to triage with steady gait, without difficulty or distress noted, brought in by [facility]; pt reports off of his meds x 3-624months; reports depression, SI, +ETOH

## 2018-09-05 NOTE — ED Notes (Signed)
Snack and beverage given. 

## 2018-09-05 NOTE — ED Notes (Signed)
Hourly rounding reveals patient sleeping in room. No complaints, stable, in no acute distress. Q15 minute rounds and monitoring via Security Cameras to continue. 

## 2018-09-05 NOTE — ED Notes (Signed)
Hourly rounding reveals patient in room. No complaints, stable, in no acute distress. Q15 minute rounds and monitoring via Security Cameras to continue. 

## 2018-09-06 MED ORDER — ONDANSETRON 4 MG PO TBDP
4.0000 mg | ORAL_TABLET | Freq: Once | ORAL | Status: AC
  Administered 2018-09-06: 4 mg via ORAL
  Filled 2018-09-06 (×2): qty 1

## 2018-09-06 NOTE — ED Notes (Addendum)
Patient left Ama, states that he has to go because He has somewhere to stay now,Patient does deny Si/hi or avh,  Patient was cooperative, nurse talked to Dr Mayford KnifeWilliams, and He said He can leave AMA , that He is voluntary,  Patient received all of His belongings.

## 2018-09-06 NOTE — ED Notes (Signed)
Patient is having episodes of dry heaves, nurse called to get order for Zofran.

## 2018-09-06 NOTE — ED Notes (Signed)
Pt BAL is significantly elevated ( BAL 275). Pt will be assessed when she is no longer intoxicated.

## 2018-09-06 NOTE — BH Assessment (Signed)
Assessment Note  Alex [name] is an 42 y.o. male who presents to the ER due to having thoughts of ending his life and having trouble remaining sober. Patient states, he has had ongoing thoughts of ending his life but the thoughts have increased. Per his report, he primarily depressed due to his alcohol use. He states he want to stop but don't know how. He further reports, he is unable to pay for his medications and it is causing the depression to increase. "I barely had enough money to pay for my seizure medicines."  During the interview, the patient was calm, cooperative and pleasant. He was able to provide appropriate answer to the questions. He denies HI and AV/H and endorse SI with no specific plan. Patient is well known to the ER for similar presentations. When he is sober he denies SI and discharge home but with this visit, he continues to voice SI.  He states he don't know how he would end his life but he "have ways" he can do it. Patient also denies history of violence and aggression.  Diagnosis: Depression & Alcohol Use Disorder  Past Medical History:  Past Medical History:  Diagnosis Date  . Alcohol abuse   . Anxiety   . Cocaine abuse (HCC) 05/27/2016  . Depression   . Seizures (HCC)     Past Surgical History:  Procedure Laterality Date  . FINGER SURGERY      Family History: No family history on file.  Social History:  reports that he has been smoking cigarettes. He has been smoking about 1.00 pack per day. He has never used smokeless tobacco. He reports that he drinks about 4.0 - 5.0 standard drinks of alcohol per week. He reports that he has current or past drug history. Drugs: Marijuana and Cocaine.  Additional Social History:  Alcohol / Drug Use Pain Medications: See PTA Prescriptions: See PTA Over the Counter: See PTA History of alcohol / drug use?: Yes Longest period of sobriety (when/how long): Unable to quantify Negative Consequences of Use: Personal relationships,  Financial Withdrawal Symptoms: Sweats, Nausea / Vomiting Substance #1 Name of Substance 1: Alcohol 1 - Age of First Use: Unable to quantify 1 - Amount (size/oz): "Alot" (Unable to quantify) 1 - Frequency: Daily 1 - Duration: "For awhile" (Unable to quantify) 1 - Last Use / Amount: 09/05/2018  CIWA: CIWA-Ar BP: (!) 132/91 Pulse Rate: 80 COWS:    Allergies:  Allergies  Allergen Reactions  . Peanut Butter Flavor     Home Medications:  (Not in a hospital admission)  OB/GYN Status:  No LMP for male patient.  General Assessment Data [facility] Assessment: In system Is this a Tele or Face-to-Face Assessment?: Face-to-Face Is this an Initial Assessment or a Re-assessment for this encounter?: Initial Assessment Patient Accompanied by:: N/A Language Other than English: No Living Arrangements: Other (Comment)(Private Home) What gender do you identify as?: Male Marital status: Single Pregnancy Status: No Living Arrangements: Parent Can pt return to current living arrangement?: Yes Admission Status: Voluntary Is patient capable of signing voluntary admission?: Yes Referral Source: Self/Family/Friend Insurance type: Medicaid  Medical Screening Exam [facility] Walk-in ONLY) Medical Exam completed: Yes  Crisis Care Plan Living Arrangements: Parent Legal Guardian: Mother(Per patient's chart in FYI) Name of Psychiatrist: Reports of none Name of Therapist: Reports of none  Education Status Is patient currently in school?: No Is the patient employed, unemployed or receiving disability?: Unemployed, Receiving disability income  Risk to self with  the past 6 months Suicidal Ideation: Yes-Currently Present Has patient been a risk to self within the past 6 months prior to admission? : No Suicidal Intent: No Has patient had any suicidal intent within the past 6 months prior to admission? : No Is patient at risk for suicide?: Yes Suicidal Plan?: No-Not  Currently/Within Last 6 Months Has patient had any suicidal plan within the past 6 months prior to admission? : Yes Access to Means: Yes Specify Access to Suicidal Means: Reports of none with current visit What has been your use of drugs/alcohol within the last 12 months?: Alcohol Previous Attempts/Gestures: No How many times?: 0 Other Self Harm Risks: Alcohol Abuse Triggers for Past Attempts: None known Intentional Self Injurious Behavior: None Family Suicide History: No Recent stressful life event(s): Other (Comment)(Alcohol Abuse) Persecutory voices/beliefs?: No Depression: Yes Depression Symptoms: Isolating, Tearfulness, Despondent, Insomnia, Guilt, Fatigue, Loss of interest in usual pleasures, Feeling worthless/self pity Substance abuse history and/or treatment for substance abuse?: Yes Suicide prevention information given to non-admitted patients: Not applicable  Risk to Others within the past 6 months Homicidal Ideation: No Does patient have any lifetime risk of violence toward others beyond the six months prior to admission? : No Thoughts of Harm to Others: No Current Homicidal Intent: No Current Homicidal Plan: No Access to Homicidal Means: No Identified Victim: Reports of none History of harm to others?: No Assessment of Violence: None Noted Violent Behavior Description: Reports of none Does patient have access to weapons?: No Criminal Charges Pending?: No Does patient have a court date: No Is patient on probation?: No  Psychosis Hallucinations: None noted Delusions: None noted  Mental Status Report Appearance/Hygiene: Unremarkable, In scrubs Eye Contact: Fair Motor Activity: Freedom of movement, Unremarkable Speech: Logical/coherent, Unremarkable Level of Consciousness: Alert Mood: Depressed, Anxious, Sad, Helpless, Pleasant Affect: Depressed, Sad Anxiety Level: Minimal Thought Processes: Coherent, Relevant Judgement: Unimpaired Orientation: Person, Place,  Time, Situation, Appropriate for developmental age Obsessive Compulsive Thoughts/Behaviors: Minimal  Cognitive Functioning Concentration: Normal Memory: Recent Intact, Remote Intact Is patient IDD: No Insight: Fair Impulse Control: Fair Appetite: Fair Have you had any weight changes? : No Change Sleep: Decreased Total Hours of Sleep: 4 Vegetative Symptoms: None  ADLScreening [facility] Assessment Services) Patient's cognitive ability adequate to safely complete daily activities?: Yes Patient able to express need for assistance with ADLs?: Yes Independently performs ADLs?: Yes (appropriate for developmental age)  Prior Inpatient Therapy Prior Inpatient Therapy: Yes Prior Therapy Dates: Multiple Hospitalizations Prior Therapy Facilty/Provider(s): [facility] Reason for Treatment: Depression & Substance Abuse Treatment  Prior Outpatient Therapy Prior Outpatient Therapy: No Does patient have an ACCT team?: No Does patient have Intensive In-House Services?  : No Does patient have [facility] services? : No Does patient have P4CC services?: No  ADL Screening (condition at time of admission) Patient's cognitive ability adequate to safely complete daily activities?: Yes Is the patient deaf or have difficulty hearing?: No Does the patient have difficulty seeing, even when wearing glasses/contacts?: No Does the patient have difficulty concentrating, remembering, or making decisions?: No Patient able to express need for assistance with ADLs?: Yes Does the patient have difficulty dressing or bathing?: No Independently performs ADLs?: Yes (appropriate for developmental age) Does the patient have difficulty walking or climbing stairs?: No Weakness of Legs: None Weakness of Arms/Hands: None  Home Assistive Devices/Equipment Home Assistive Devices/Equipment: None  Therapy Consults (therapy consults require a physician order) PT Evaluation Needed: No OT Evalulation Needed: No SLP Evaluation  Needed: No Abuse/Neglect Assessment (Assessment  to be complete while patient is alone) Abuse/Neglect Assessment Can Be Completed: Yes Physical Abuse: Denies Verbal Abuse: Denies Sexual Abuse: Denies Exploitation of patient/patient's resources: Denies Self-Neglect: Denies Values / Beliefs Cultural Requests During Hospitalization: None Spiritual Requests During Hospitalization: None Consults Spiritual Care Consult Needed: No Social Work Consult Needed: No Merchant navy officer (For Healthcare) Does Patient Have a Medical Advance Directive?: No Would patient like information on creating a medical advance directive?: No - Patient declined       Child/Adolescent Assessment Running Away Risk: Denies(Patietn is an adult)  Disposition:  Disposition Initial Assessment Completed for this Encounter: Yes  On Site Evaluation by:   Reviewed with Physician:    Lilyan Gilford MS, LCAS, LPC, NCC, CCSI Therapeutic Triage Specialist 09/06/2018 11:11 AM

## 2018-09-06 NOTE — ED Notes (Signed)
Hourly rounding reveals patient sleeping in room. No complaints, stable, in no acute distress. Q15 minute rounds and monitoring via Security Cameras to continue. 

## 2018-09-06 NOTE — ED Notes (Signed)
Patient told nurse that He wanted to leave , but nurse encouraged him to stay and talk with Dr. Toni Amendlapacs first but he said that He didn't want to stay, He is voluntary and He denies Si/hi or avh, will continue to monitor and talk to ED Doctor.

## 2018-09-06 NOTE — ED Notes (Signed)
Patient is awake, went to restroom, breakfast served with beverage, will continue to monitor.

## 2018-11-23 ENCOUNTER — Emergency Department
Admission: EM | Admit: 2018-11-23 | Discharge: 2018-11-23 | Disposition: A | Discharge status: Home / Self Care | Payer: Medicaid Other | Attending: Emergency Medicine | Admitting: Emergency Medicine

## 2018-11-23 ENCOUNTER — Encounter: Payer: Self-pay | Admitting: *Deleted

## 2018-11-23 DIAGNOSIS — F329 Major depressive disorder, single episode, unspecified: Secondary | ICD-10-CM

## 2018-11-23 DIAGNOSIS — F10920 Alcohol use, unspecified with intoxication, uncomplicated: Secondary | ICD-10-CM

## 2018-11-23 DIAGNOSIS — F32A Depression, unspecified: Secondary | ICD-10-CM

## 2018-11-23 DIAGNOSIS — F1910 Other psychoactive substance abuse, uncomplicated: Secondary | ICD-10-CM

## 2018-11-23 DIAGNOSIS — Z79899 Other long term (current) drug therapy: Secondary | ICD-10-CM | POA: Insufficient documentation

## 2018-11-23 DIAGNOSIS — F17210 Nicotine dependence, cigarettes, uncomplicated: Secondary | ICD-10-CM | POA: Insufficient documentation

## 2018-11-23 DIAGNOSIS — F10229 Alcohol dependence with intoxication, unspecified: Secondary | ICD-10-CM | POA: Insufficient documentation

## 2018-11-23 DIAGNOSIS — R569 Unspecified convulsions: Secondary | ICD-10-CM | POA: Insufficient documentation

## 2018-11-23 LAB — COMPREHENSIVE METABOLIC PANEL
ALT: 50 U/L — ABNORMAL HIGH (ref 0–44)
AST: 47 U/L — ABNORMAL HIGH (ref 15–41)
Albumin: 4.1 g/dL (ref 3.5–5.0)
Alkaline Phosphatase: 102 U/L (ref 38–126)
Anion gap: 14 (ref 5–15)
BUN: 6 mg/dL (ref 6–20)
CO2: 18 mmol/L — ABNORMAL LOW (ref 22–32)
Calcium: 9 mg/dL (ref 8.9–10.3)
Chloride: 105 mmol/L (ref 98–111)
Creatinine, Ser: 0.72 mg/dL (ref 0.61–1.24)
GFR calc Af Amer: >60 mL/min (ref >60)
Glucose, Bld: 93 mg/dL (ref 70–99)
Potassium: 3.5 mmol/L (ref 3.5–5.1)
Sodium: 137 mmol/L (ref 135–145)
Total Bilirubin: 0.3 mg/dL (ref 0.3–1.2)
Total Protein: 7.4 g/dL (ref 6.5–8.1)

## 2018-11-23 LAB — CBC
HCT: 47.2 % (ref 39.0–52.0)
Hemoglobin: 15.7 g/dL (ref 13.0–17.0)
MCH: 31.5 pg (ref 26.0–34.0)
MCHC: 33.3 g/dL (ref 30.0–36.0)
MCV: 94.8 fL (ref 80.0–100.0)
Platelets: 203 10*3/uL (ref 150–400)
RBC: 4.98 MIL/uL (ref 4.22–5.81)
RDW: 14.8 % (ref 11.5–15.5)
WBC: 6.7 10*3/uL (ref 4.0–10.5)
nRBC: 0 % (ref 0.0–0.2)

## 2018-11-23 LAB — URINE DRUG SCREEN, QUALITATIVE (ARMC ONLY)
Amphetamines, Ur Screen: NOT DETECTED
Barbiturates, Ur Screen: NOT DETECTED
Benzodiazepine, Ur Scrn: NOT DETECTED
Cannabinoid 50 Ng, Ur ~~LOC~~: POSITIVE — AB
Cocaine Metabolite,Ur ~~LOC~~: POSITIVE — AB
MDMA (Ecstasy)Ur Screen: NOT DETECTED
METHADONE SCREEN, URINE: NOT DETECTED
Opiate, Ur Screen: NOT DETECTED
Phencyclidine (PCP) Ur S: NOT DETECTED
Tricyclic, Ur Screen: NOT DETECTED

## 2018-11-23 LAB — VALPROIC ACID LEVEL: Valproic Acid Lvl: <10 ug/mL — ABNORMAL LOW (ref 50.0–100.0)

## 2018-11-23 LAB — ETHANOL: ALCOHOL ETHYL (B): 208 mg/dL — AB (ref <10)

## 2018-11-23 LAB — ACETAMINOPHEN LEVEL

## 2018-11-23 MED ORDER — VALPROIC ACID 250 MG PO CAPS
750.0000 mg | ORAL_CAPSULE | Freq: Once | ORAL | Status: AC
  Administered 2018-11-23: 750 mg via ORAL
  Filled 2018-11-23: qty 3

## 2018-11-23 NOTE — BH Assessment (Signed)
Assessment Note  Alex [name] is an 43 y.o. male pt refusing assessment  Diagnosis: Alcohol Abuse  Past Medical History:  Past Medical History:  Diagnosis Date  . Alcohol abuse   . Anxiety   . Cocaine abuse (HCC) 05/27/2016  . Depression   . Seizures (HCC)     Past Surgical History:  Procedure Laterality Date  . FINGER SURGERY      Family History: No family history on file.  Social History:  reports that he has been smoking cigarettes. He has been smoking about 1.00 pack per day. He has never used smokeless tobacco. He reports current alcohol use of about 4.0 - 5.0 standard drinks of alcohol per week. He reports current drug use. Drugs: Marijuana and Cocaine.  Additional Social History:     CIWA: CIWA-Ar BP: 112/88 Pulse Rate: 89 COWS:    Allergies:  Allergies  Allergen Reactions  . Peanut Butter Flavor     Home Medications: (Not in a hospital admission)   OB/GYN Status:  No LMP for male patient.  General Assessment Data Assessment unable to be completed: Yes Reason for not completing assessment: (Pt refusing assessment)                                                 Advance Directives (For Healthcare) Does Patient Have a Medical Advance Directive?: No          Disposition:     On Site Evaluation by:   Reviewed with Physician:    Alden Bensinger D Skylar Priest 11/23/2018 4:59 AM

## 2018-11-23 NOTE — ED Provider Notes (Signed)
-----------------------------------------   9:05 AM on 11/23/2018 ----------------------------------------- Patient being evaluated for polysubstance abuse and alcohol intoxication.  Denied suicidal thoughts or intentions.  He is now clinically sober, and after talking to his mom on the phone wants to leave the emergency department.  He is medically and psychiatrically stable.  His presentation is consistent with previous evaluations during which psychiatry has recommended outpatient follow-up.  Not committable at this time.  We will plan to discharge.  Cab is been called for the patient to assist him in returning home.  I called his mom who is listed as his legal guardian, no answer.  I believe safe discharge is appropriately arranged by providing him cab Transportation.   Sharman Cheek, MD 11/23/18 [phone]

## 2018-11-23 NOTE — ED Triage Notes (Signed)
Per EMS pt has a seizure disorder and hasnt had his medication . Family states it was approx 30 min. Pt also has had ETOH tonight.

## 2018-11-23 NOTE — ED Notes (Signed)
Hourly rounding reveals patient sleeping in room. No complaints, stable, in no acute distress. Q15 minute rounds and monitoring via Security to continue. 

## 2018-11-23 NOTE — ED Notes (Signed)
Patient came out of room and stated he was ready to go home, writer spoke with the doctor and informed him that patient was ready to go home and doctor said he will come and assess him

## 2018-11-23 NOTE — ED Notes (Signed)
Pt. Moved from room #24 to quad #20.  Pt. Refused to talk to TTS.

## 2018-11-23 NOTE — ED Notes (Signed)
Patient talking to his mother

## 2018-11-23 NOTE — ED Provider Notes (Signed)
[facility] Emergency Department Provider Note   ____________________________________________    I have reviewed the triage vital signs and the nursing notes.   HISTORY  Chief Complaint Seizures and Alcohol Intoxication     HPI Alex [name] is a 43 y.o. male presents after reported seizure. Patient has a history of alcohol abuse and admits to drinking tonight. He reports intermittent compliance with his medications. His seizure history is somewhat questionable after review of medical records, regardless he is supposed to be taking valproic acid and says that sometimes he does take it. He also admits to depression. Told a nurse that he occasionally has thoughts of harming himself.    Past Medical History:  Diagnosis Date  . Alcohol abuse   . Anxiety   . Cocaine abuse (HCC) 05/27/2016  . Depression   . Seizures [facility])     Patient Active Problem List   Diagnosis Date Noted  . Substance induced mood disorder (HCC) 12/30/2016  . Moderate recurrent major depression (HCC) 12/30/2016  . Cocaine abuse (HCC)   . ADHD 08/11/2016  . Alcohol dependence with unspecified alcohol-induced disorder (HCC) 06/28/2016  . Alcohol withdrawal (HCC) 06/28/2016  . Cocaine use disorder, moderate, dependence (HCC) 06/28/2016  . Unspecified Depressive disorder 06/28/2016  . Tobacco use disorder 06/28/2016    Past Surgical History:  Procedure Laterality Date  . FINGER SURGERY      Prior to Admission medications   Medication Sig Start Date End Date Taking? Authorizing Provider  citalopram (CELEXA) 40 MG tablet Take 40 mg by mouth daily. 02/27/18   [provider]  Multiple Vitamin (MULTIVITAMIN) tablet Take 1 tablet by mouth daily.    [provider]  omeprazole (PRILOSEC) 20 MG capsule Take 20 mg by mouth 2 (two) times daily before a meal.    [provider]  valproic acid (DEPAKENE) 250 MG capsule Take 3 capsules (750 mg total) by mouth at  bedtime. 07/11/18 07/11/19  Loleta Rose, MD     Allergies Peanut butter flavor  No family history on file.  Social History Social History   Tobacco Use  . Smoking status: Current Every Day Smoker    Packs/day: 1.00    Types: Cigarettes  . Smokeless tobacco: Never Used  Substance Use Topics  . Alcohol use: Yes    Alcohol/week: 4.0 - 5.0 standard drinks    Types: 4 - 5 Cans of beer per week    Comment: Pt states that last drink was 3-4 days ago  . Drug use: Yes    Types: Marijuana, Cocaine    Comment: cocaine last used on friday    Review of Systems  Constitutional: No fever/chills Eyes: No visual changes.  ENT: No sore throat. Cardiovascular: Denies chest pain. Respiratory: Denies shortness of breath. Gastrointestinal: No abdominal pain.     Genitourinary: Negative for dysuria. Musculoskeletal: Negative for back pain. Skin: Negative for rash. Neurological: Negative for headaches or weakness   ____________________________________________   PHYSICAL EXAM:  VITAL SIGNS: ED Triage Vitals  Enc Vitals Group     BP 11/23/18 0149 128/90     Pulse Rate 11/23/18 0149 89     Resp 11/23/18 0149 20     Temp 11/23/18 0149 98.2 F (36.8 C)     Temp Source 11/23/18 0149 Oral     SpO2 11/23/18 0149 99 %     Weight 11/23/18 0151 99.8 kg (220 lb)     Height 11/23/18 0151 1.676 m (5\' 6" )  Head Circumference --      Peak Flow --      Pain Score 11/23/18 0150 8     Pain Loc --      Pain Edu? --      Excl. in GC? --     Constitutional: Alert and oriented.   Nose: No congestion/rhinnorhea. Mouth/Throat: Mucous membranes are moist.    Cardiovascular: Normal rate, regular rhythm. Grossly normal heart sounds.  Good peripheral circulation. Respiratory: Normal respiratory effort.  No retractions. Lungs CTAB. Gastrointestinal: Soft and nontender. No distention.  No CVA tenderness.  Musculoskeletal: No lower extremity tenderness nor edema.  Warm and well  perfused Neurologic:  Normal speech and language. No gross focal neurologic deficits are appreciated.  Skin:  Skin is warm, dry and intact.  Psychiatric: depressed mood  ____________________________________________   LABS (all labs ordered are listed, but only abnormal results are displayed)  Labs Reviewed  COMPREHENSIVE METABOLIC PANEL - Abnormal; Notable for the following components:      Result Value   CO2 18 (*)    AST 47 (*)    ALT 50 (*)    All other components within normal limits  ETHANOL - Abnormal; Notable for the following components:   Alcohol, Ethyl (B) 208 (*)    All other components within normal limits  URINE DRUG SCREEN, QUALITATIVE ([facility] ONLY) - Abnormal; Notable for the following components:   Cocaine Metabolite,Ur [place] POSITIVE (*)    Cannabinoid 50 Ng, Ur [place] POSITIVE (*)    All other components within normal limits  ACETAMINOPHEN LEVEL - Abnormal; Notable for the following components:   Acetaminophen (Tylenol), Serum <10 (*)    All other components within normal limits  CBC  VALPROIC ACID LEVEL   ____________________________________________  EKG  ED ECG REPORT I, Jene Every, the attending physician, personally viewed and interpreted this ECG.  Date: 11/23/2018  Rhythm: normal sinus rhythm QRS Axis: normal Intervals: normal ST/T Wave abnormalities: normal Narrative Interpretation: no evidence of acute ischemia  ____________________________________________  RADIOLOGY   ____________________________________________   PROCEDURES  Procedure(s) performed: No  Procedures   Critical Care performed: No ____________________________________________   INITIAL IMPRESSION / ASSESSMENT AND PLAN / ED COURSE  Pertinent labs & imaging results that were available during my care of the patient were reviewed by me and considered in my medical decision making (see chart for details).  Patient presents with reports of possible seizure as well as  depression. He also admits to etoh use, his level is 208. He is positive for cocaine and cannibus on UDS. Will give a dose of valproic acid here. I do not believe the patient meets commitment criteria, he denies active suicidality to me. I will consult TTS and psych for evaluation.    ____________________________________________   FINAL CLINICAL IMPRESSION(S) / ED DIAGNOSES  Final diagnoses:  Alcoholic intoxication without complication (HCC)  Seizure-like activity (HCC)  Depression, unspecified depression type        Note:  This document was prepared using Dragon voice recognition software and may include unintentional dictation errors.   Jene Every, MD 11/23/18 [phone]

## 2019-02-08 ENCOUNTER — Ambulatory Visit: Payer: Self-pay | Admitting: *Deleted

## 2019-02-08 NOTE — Telephone Encounter (Signed)
Pt reports he can not see his PCP as he no longer has insurance. Called [facility] main line. Reports chest pain "After arguing with someone, happens all the time when I'm fighting." Evasive historian, speech slightly slurred. H/O ETOH. States pain presently is "At bottom of my ribs, near left side, comes and goes." Also reports hurts a little more when I breath in deep." States nausea at times, not presently. Pt directed to ED. States "I know what to do if I need help, I'll get my mother who lives next door." Advised to call EMS if necessary.  Reason for Disposition . Taking a deep breath makes pain worse  Answer Assessment - Initial Assessment Questions 1. LOCATION: "Where does it hurt?"       "Bottom of chest left side" 2. RADIATION: "Does the pain go anywhere else?" (e.g., into neck, jaw, arms, back)     To back 3. ONSET: "When did the chest pain begin?" (Minutes, hours or days)      Yesterday 4. PATTERN "Does the pain come and go, or has it been constant since it started?"  "Does it get worse with exertion?"      intermittent 5. DURATION: "How long does it last" (e.g., seconds, minutes, hours)     3-4 minutes 6. SEVERITY: "How bad is the pain?"  (e.g., Scale 1-10; mild, moderate, or severe)    - MILD (1-3): doesn't interfere with normal activities     - MODERATE (4-7): interferes with normal activities or awakens from sleep    - SEVERE (8-10): excruciating pain, unable to do any normal activities       6/10 7. CARDIAC RISK FACTORS: "Do you have any history of heart problems or risk factors for heart disease?" (e.g., prior heart attack, angina; high blood pressure, diabetes, being overweight, high cholesterol, smoking, or strong family history of heart disease)      8. PULMONARY RISK FACTORS: "Do you have any history of lung disease?"  (e.g., blood clots in lung, asthma, emphysema, birth control pills)     9. CAUSE: "What do you think is causing the chest pain?"      10. OTHER SYMPTOMS: "Do  you have any other symptoms?" (e.g., dizziness, nausea, vomiting, sweating, fever, difficulty breathing, cough)   Mild SOB, nausea, not presently  Protocols used: CHEST PAIN-A-AH

## 2019-03-22 ENCOUNTER — Encounter: Payer: Self-pay | Admitting: Emergency Medicine

## 2019-03-22 ENCOUNTER — Emergency Department: Payer: Self-pay

## 2019-03-22 ENCOUNTER — Emergency Department
Admission: EM | Admit: 2019-03-22 | Discharge: 2019-03-23 | Disposition: A | Discharge status: Home / Self Care | Payer: Self-pay | Attending: Emergency Medicine | Admitting: Emergency Medicine

## 2019-03-22 DIAGNOSIS — F32A Depression, unspecified: Secondary | ICD-10-CM | POA: Diagnosis present

## 2019-03-22 DIAGNOSIS — F17210 Nicotine dependence, cigarettes, uncomplicated: Secondary | ICD-10-CM | POA: Insufficient documentation

## 2019-03-22 DIAGNOSIS — F331 Major depressive disorder, recurrent, moderate: Secondary | ICD-10-CM | POA: Insufficient documentation

## 2019-03-22 DIAGNOSIS — F329 Major depressive disorder, single episode, unspecified: Secondary | ICD-10-CM | POA: Diagnosis present

## 2019-03-22 DIAGNOSIS — F10288 Alcohol dependence with other alcohol-induced disorder: Secondary | ICD-10-CM | POA: Insufficient documentation

## 2019-03-22 DIAGNOSIS — R109 Unspecified abdominal pain: Secondary | ICD-10-CM | POA: Insufficient documentation

## 2019-03-22 DIAGNOSIS — R079 Chest pain, unspecified: Secondary | ICD-10-CM | POA: Insufficient documentation

## 2019-03-22 DIAGNOSIS — Z79899 Other long term (current) drug therapy: Secondary | ICD-10-CM | POA: Insufficient documentation

## 2019-03-22 DIAGNOSIS — F1420 Cocaine dependence, uncomplicated: Secondary | ICD-10-CM | POA: Diagnosis present

## 2019-03-22 DIAGNOSIS — Z20828 Contact with and (suspected) exposure to other viral communicable diseases: Secondary | ICD-10-CM | POA: Insufficient documentation

## 2019-03-22 DIAGNOSIS — Z9119 Patient's noncompliance with other medical treatment and regimen: Secondary | ICD-10-CM | POA: Insufficient documentation

## 2019-03-22 DIAGNOSIS — F1029 Alcohol dependence with unspecified alcohol-induced disorder: Secondary | ICD-10-CM | POA: Diagnosis present

## 2019-03-22 LAB — CBC
HCT: 44.8 % (ref 39.0–52.0)
Hemoglobin: 15.4 g/dL (ref 13.0–17.0)
MCH: 32.8 pg (ref 26.0–34.0)
MCHC: 34.4 g/dL (ref 30.0–36.0)
MCV: 95.5 fL (ref 80.0–100.0)
Platelets: 169 10*3/uL (ref 150–400)
RBC: 4.69 MIL/uL (ref 4.22–5.81)
RDW: 13.8 % (ref 11.5–15.5)
WBC: 4.2 10*3/uL (ref 4.0–10.5)
nRBC: 0 % (ref 0.0–0.2)

## 2019-03-22 LAB — URINE DRUG SCREEN, QUALITATIVE (ARMC ONLY)
Amphetamines, Ur Screen: NOT DETECTED
Barbiturates, Ur Screen: NOT DETECTED
Benzodiazepine, Ur Scrn: NOT DETECTED
Cannabinoid 50 Ng, Ur ~~LOC~~: POSITIVE — AB
Cocaine Metabolite,Ur ~~LOC~~: POSITIVE — AB
MDMA (Ecstasy)Ur Screen: NOT DETECTED
Methadone Scn, Ur: NOT DETECTED
Opiate, Ur Screen: NOT DETECTED
Phencyclidine (PCP) Ur S: NOT DETECTED
Tricyclic, Ur Screen: NOT DETECTED

## 2019-03-22 LAB — COMPREHENSIVE METABOLIC PANEL
ALT: 189 U/L — ABNORMAL HIGH (ref 0–44)
AST: 200 U/L — ABNORMAL HIGH (ref 15–41)
Albumin: 4.2 g/dL (ref 3.5–5.0)
Alkaline Phosphatase: 98 U/L (ref 38–126)
Anion gap: 15 (ref 5–15)
BUN: 6 mg/dL (ref 6–20)
CO2: 21 mmol/L — ABNORMAL LOW (ref 22–32)
Calcium: 9 mg/dL (ref 8.9–10.3)
Chloride: 96 mmol/L — ABNORMAL LOW (ref 98–111)
Creatinine, Ser: 0.61 mg/dL (ref 0.61–1.24)
GFR calc Af Amer: >60 mL/min (ref >60)
GFR calc non Af Amer: >60 mL/min (ref >60)
Glucose, Bld: 92 mg/dL (ref 70–99)
Potassium: 4.1 mmol/L (ref 3.5–5.1)
Sodium: 132 mmol/L — ABNORMAL LOW (ref 135–145)
Total Bilirubin: 0.8 mg/dL (ref 0.3–1.2)
Total Protein: 8 g/dL (ref 6.5–8.1)

## 2019-03-22 LAB — ACETAMINOPHEN LEVEL: Acetaminophen (Tylenol), Serum: <10 ug/mL — ABNORMAL LOW (ref 10–30)

## 2019-03-22 LAB — ETHANOL: Alcohol, Ethyl (B): 172 mg/dL — ABNORMAL HIGH (ref <10)

## 2019-03-22 LAB — SALICYLATE LEVEL: Salicylate Lvl: <7 mg/dL (ref 2.8–30.0)

## 2019-03-22 LAB — LIPASE, BLOOD: Lipase: 50 U/L (ref 11–51)

## 2019-03-22 MED ORDER — CITALOPRAM HYDROBROMIDE 20 MG PO TABS
40.0000 mg | ORAL_TABLET | Freq: Every day | ORAL | Status: DC
  Administered 2019-03-23: 40 mg via ORAL
  Filled 2019-03-22: qty 2

## 2019-03-22 MED ORDER — ALUM & MAG HYDROXIDE-SIMETH 200-200-20 MG/5ML PO SUSP
30.0000 mL | Freq: Once | ORAL | Status: AC
  Administered 2019-03-22: 30 mL via ORAL
  Filled 2019-03-22: qty 30

## 2019-03-22 MED ORDER — TRAZODONE HCL 50 MG PO TABS
150.0000 mg | ORAL_TABLET | Freq: Every evening | ORAL | Status: DC | PRN
  Administered 2019-03-22: 150 mg via ORAL
  Filled 2019-03-22: qty 1

## 2019-03-22 MED ORDER — PANTOPRAZOLE SODIUM 40 MG PO TBEC
40.0000 mg | DELAYED_RELEASE_TABLET | Freq: Every day | ORAL | Status: DC
  Administered 2019-03-22 – 2019-03-23 (×2): 40 mg via ORAL
  Filled 2019-03-22 (×2): qty 1

## 2019-03-22 MED ORDER — LIDOCAINE VISCOUS HCL 2 % MT SOLN
15.0000 mL | Freq: Once | OROMUCOSAL | Status: AC
  Administered 2019-03-22: 15 mL via ORAL
  Filled 2019-03-22: qty 15

## 2019-03-22 MED ORDER — ADULT MULTIVITAMIN W/MINERALS CH
1.0000 | ORAL_TABLET | Freq: Every day | ORAL | Status: DC
  Administered 2019-03-22 – 2019-03-23 (×2): 1 via ORAL
  Filled 2019-03-22 (×2): qty 1

## 2019-03-22 MED ORDER — IOPAMIDOL (ISOVUE-370) INJECTION 76%
100.0000 mL | Freq: Once | INTRAVENOUS | Status: AC | PRN
  Administered 2019-03-22: 100 mL via INTRAVENOUS

## 2019-03-22 MED ORDER — IOHEXOL 240 MG/ML SOLN
50.0000 mL | Freq: Once | INTRAMUSCULAR | Status: AC | PRN
  Administered 2019-03-22: 50 mL via ORAL

## 2019-03-22 MED ORDER — VALPROIC ACID 250 MG PO CAPS
750.0000 mg | ORAL_CAPSULE | Freq: Every day | ORAL | Status: DC
  Administered 2019-03-22: 750 mg via ORAL
  Filled 2019-03-22 (×2): qty 3

## 2019-03-22 MED ORDER — DICYCLOMINE HCL 10 MG/5ML PO SOLN
10.0000 mg | Freq: Once | ORAL | Status: AC
  Administered 2019-03-22: 10 mg via ORAL
  Filled 2019-03-22: qty 5

## 2019-03-22 NOTE — ED Notes (Signed)
PT to ct with Arnel CT tech. RN aware.

## 2019-03-22 NOTE — ED Notes (Signed)
Pt is sleeping and appears comfortable. Will medicate when he awakens.

## 2019-03-22 NOTE — BH Assessment (Addendum)
Assessment Note  Alex [name] is an 43 y.o. male. Who presents to the ER for a psychiatric evaluation. Patient presented as alert and oriented. He confirmed that he currently lives with his mother and she is currently his legal guardian. When questioned about why he patient presented to the ER on today he states" I didn't feel good my stomach and chest hurt." Patient continued to elaborate I wanted to hurt myself and I figured I'd just get over. When discussing onset of suicidal thoughts patient states "I've always felt that way." Pt. denies any suicidal, plan or intent.  Patient is unable to identify the last time in which she was free of suicidal thoughts. He denies having any current plan and admits to previous attempts. He reports previous psychiatric hospitalizations although they were in the distant past. Patient shares that he's been without his psychiatric medication for approximately one year do to financial constraints. He admi to drinking alcohol daily and states he also uses marijuana and cocaine. He is unable to quantify amounts or frequency of use. Pt. denies the presence of any auditory or visual hallucinations at this time. Patient denies any other medical complaints.    Diagnosis: Major Depressive Disorder   Past Medical History:  Past Medical History:  Diagnosis Date  . Alcohol abuse   . Anxiety   . Cocaine abuse ([place]) 05/27/2016  . Depression   . Seizures ([place])     Past Surgical History:  Procedure Laterality Date  . FINGER SURGERY      Family History: History reviewed. No pertinent family history.  Social History:  reports that he has been smoking cigarettes. He has been smoking about 1.00 pack per day. He has never used smokeless tobacco. He reports current alcohol use of about 4.0 - 5.0 standard drinks of alcohol per week. He reports current drug use. Drugs: Marijuana and Cocaine.  Additional Social History:  Alcohol / Drug Use Pain Medications: SEE  PTA Prescriptions: SEE PTA Over the Counter: SEE PTA History of alcohol / drug use?: Yes Withdrawal Symptoms: Tachycardia Substance #1 Name of Substance 1: Alcohol 1 - Age of First Use: Teen 1 - Amount (size/oz): Couple beers 1 - Frequency: daily 1 - Duration: ogoing 1 - Last Use / Amount: Yesterday, one beer  CIWA: CIWA-Ar BP: (!) 150/91 Pulse Rate: 76 COWS:    Allergies:  Allergies  Allergen Reactions  . Peanut Butter Flavor     Home Medications: (Not in a hospital admission)   OB/GYN Status:  No LMP for male patient.  General Assessment Data Location of Assessment: [facility] ED TTS Assessment: In system Is this a Tele or Face-to-Face Assessment?: Tele Assessment Is this an Initial Assessment or a Re-assessment for this encounter?: Initial Assessment Patient Accompanied by:: N/A Living Arrangements: Other (Comment) Marital status: Single Living Arrangements: Parent Can pt return to current living arrangement?: Yes Admission Status: Voluntary Is patient capable of signing voluntary admission?: Yes Referral Source: Self/Family/Friend Insurance type: None   Medical Screening Exam ([place]) Medical Exam completed: Yes  Crisis Care Plan Living Arrangements: Parent Legal Guardian: Mother(Loraine Faucet) Name of Psychiatrist: None  Name of Therapist: None   Education Status Is patient currently in school?: No Is the patient employed, unemployed or receiving disability?: Receiving disability income  Risk to self with the past 6 months Suicidal Ideation: Yes-Currently Present Has patient been a risk to self within the past 6 months prior to admission? : Yes Suicidal Intent: No-Not Currently/Within Last 6 Months  Has patient had any suicidal intent within the past 6 months prior to admission? : No Is patient at risk for suicide?: Yes Suicidal Plan?: No Has patient had any suicidal plan within the past 6 months prior to admission? : No What has been your use  of drugs/alcohol within the last 12 months?: Alcohol and cocaine use  Previous Attempts/Gestures: Yes How many times?: (States "a Couple" ) Other Self Harm Risks: non e Triggers for Past Attempts: Unknown Intentional Self Injurious Behavior: None Family Suicide History: No Recent stressful life event(s): Other (Comment)(lack of health care ) Persecutory voices/beliefs?: No Depression: Yes Depression Symptoms: Fatigue, Isolating, Insomnia, Loss of interest in usual pleasures Substance abuse history and/or treatment for substance abuse?: Yes Suicide prevention information given to non-admitted patients: Not applicable  Risk to Others within the past 6 months Homicidal Ideation: No Does patient have any lifetime risk of violence toward others beyond the six months prior to admission? : No Thoughts of Harm to Others: No Current Homicidal Intent: No Current Homicidal Plan: No Access to Homicidal Means: No Identified Victim: none History of harm to others?: No Assessment of Violence: None Noted Violent Behavior Description: none  Does patient have access to weapons?: No Criminal Charges Pending?: No Does patient have a court date: No Is patient on probation?: No  Psychosis Hallucinations: None noted Delusions: None noted  Mental Status Report Appearance/Hygiene: In scrubs Eye Contact: Poor Motor Activity: Freedom of movement Speech: Logical/coherent Level of Consciousness: Alert Mood: Depressed Affect: Anxious, Depressed, Flat Anxiety Level: Minimal Thought Processes: Relevant Judgement: Partial Orientation: Place, Time, Person, Situation Obsessive Compulsive Thoughts/Behaviors: None  Cognitive Functioning Concentration: Fair Memory: Recent Intact, Remote Intact Insight: Poor Impulse Control: Fair Appetite: Fair Have you had any weight changes? : No Change Sleep: Decreased Total Hours of Sleep: 3 Vegetative Symptoms: None  ADLScreening [facility] Assessment  Services) Patient's cognitive ability adequate to safely complete daily activities?: Yes Patient able to express need for assistance with ADLs?: Yes Independently performs ADLs?: Yes (appropriate for developmental age)  Prior Inpatient Therapy Prior Inpatient Therapy: Yes Prior Therapy Dates: Distant past Prior Therapy Facilty/Provider(s): Unknown  Reason for Treatment: Depressiona and SI   Prior Outpatient Therapy Prior Outpatient Therapy: No Does patient have an ACCT team?: No Does patient have Intensive In-House Services?  : No Does patient have [facility] services? : No Does patient have P4CC services?: No  ADL Screening (condition at time of admission) Patient's cognitive ability adequate to safely complete daily activities?: Yes Patient able to express need for assistance with ADLs?: Yes Independently performs ADLs?: Yes (appropriate for developmental age)        [facility]  Disposition Initial Assessment Completed for this Encounter: Yes  On Site Evaluation by:   Reviewed with Physician:    Asa SaunasShawanna N Twyla Dais 03/22/2019 11:56 PM

## 2019-03-22 NOTE — ED Provider Notes (Signed)
[facility] Emergency Department Provider Note   ____________________________________________   First MD Initiated Contact with Patient 03/22/19 2018     (approximate)  I have reviewed the triage vital signs and the nursing notes.   HISTORY  Chief Complaint Mental Health Problem    HPI Alex [name] is a 43 y.o. male who reports he went to see his primary care doctor gave him a shot on Monday.  That was in the morning by afternoon he was having a lot of chest and belly pain and felt hot and cold.  He still feeling hot and cold and having chest and belly pain.  The chest pain is worse with deep breathing belly pain is kind of burning and diffuse worse with palpation.  They are moderately severe.        Past Medical History:  Diagnosis Date  . Alcohol abuse   . Anxiety   . Cocaine abuse (HCC) 05/27/2016  . Depression   . Seizures [facility](HCC)     Patient Active Problem List   Diagnosis Date Noted  . Substance induced mood disorder (HCC) 12/30/2016  . Moderate recurrent major depression (HCC) 12/30/2016  . Cocaine abuse (HCC)   . ADHD 08/11/2016  . Alcohol dependence with unspecified alcohol-induced disorder (HCC) 06/28/2016  . Alcohol withdrawal (HCC) 06/28/2016  . Cocaine use disorder, moderate, dependence (HCC) 06/28/2016  . Unspecified Depressive disorder 06/28/2016  . Tobacco use disorder 06/28/2016    Past Surgical History:  Procedure Laterality Date  . FINGER SURGERY      Prior to Admission medications   Medication Sig Start Date End Date Taking? Authorizing Provider  citalopram (CELEXA) 40 MG tablet Take 40 mg by mouth daily. 02/27/18   [provider]  Multiple Vitamin (MULTIVITAMIN) tablet Take 1 tablet by mouth daily.    [provider]  omeprazole (PRILOSEC) 20 MG capsule Take 20 mg by mouth 2 (two) times daily before a meal.    [provider]  valproic acid (DEPAKENE) 250 MG capsule Take 3 capsules (750 mg  total) by mouth at bedtime. 07/11/18 07/11/19  [name], Cory, MD    Allergies Peanut butter flavor  History reviewed. No pertinent family history.  Social History Social History   Tobacco Use  . Smoking status: Current Every Day Smoker    Packs/day: 1.00    Types: Cigarettes  . Smokeless tobacco: Never Used  Substance Use Topics  . Alcohol use: Yes    Alcohol/week: 4.0 - 5.0 standard drinks    Types: 4 - 5 Cans of beer per week    Comment: Pt states that last drink was 3-4 days ago  . Drug use: Yes    Types: Marijuana, Cocaine    Comment: cocaine last used on friday    Review of Systems  Constitutional: No fever/chills Eyes: No visual changes. ENT: No sore throat. Cardiovascular:chest pain. Respiratory: Denies shortness of breath. Gastrointestinal: Diffuse abdominal pain.  nausea, no vomiting.  No diarrhea.  No constipation. Genitourinary: Negative for dysuria. Musculoskeletal: Negative for back pain. Skin: Negative for rash. Neurological: Negative for headaches, focal weakness   ____________________________________________   PHYSICAL EXAM:  VITAL SIGNS: ED Triage Vitals [03/22/19 1957]  Enc Vitals Group     BP (!) 150/91     Pulse Rate 76     Resp 20     Temp 98.8 F (37.1 C)     Temp Source Oral     SpO2 98 %  Weight      Height      Head Circumference      Peak Flow      Pain Score      Pain Loc      Pain Edu?      Excl. in [place]?     Constitutional: Alert and oriented. Well appearing and in no acute distress. Eyes: Conjunctivae are normal. PERRL. EOMI. Head: Atraumatic. Nose: No congestion/rhinnorhea. Mouth/Throat: Mucous membranes are moist.  Oropharynx non-erythematous. Neck: No stridor.  Cardiovascular: Normal rate, regular rhythm. Grossly normal heart sounds.  Good peripheral circulation. Respiratory: Normal respiratory effort.  No retractions. Lungs CTAB. Gastrointestinal: Soft diffusely and exquisitely tender to palpation no distention.  No abdominal bruits. Musculoskeletal: No lower extremity tenderness nor edema.   Neurologic:  Normal speech and language. No gross focal neurologic deficits are appreciated.  Skin:  Skin is warm, dry and intact. No rash noted.   ____________________________________________   LABS (all labs ordered are listed, but only abnormal results are displayed)  Labs Reviewed  COMPREHENSIVE METABOLIC PANEL - Abnormal; Notable for the following components:      Result Value   Sodium 132 (*)    Chloride 96 (*)    CO2 21 (*)    AST 200 (*)    ALT 189 (*)    All other components within normal limits  ETHANOL - Abnormal; Notable for the following components:   Alcohol, Ethyl (B) 172 (*)    All other components within normal limits  ACETAMINOPHEN LEVEL - Abnormal; Notable for the following components:   Acetaminophen (Tylenol), Serum <10 (*)    All other components within normal limits  URINE DRUG SCREEN, QUALITATIVE ([facility] ONLY) - Abnormal; Notable for the following components:   Cocaine Metabolite,Ur [place] POSITIVE (*)    Cannabinoid 50 Ng, Ur [place] POSITIVE (*)    All other components within normal limits  SARS CORONAVIRUS 2 (HOSPITAL ORDER, [place] [facility])  SALICYLATE LEVEL  CBC  LIPASE, BLOOD  TROPONIN I (HIGH SENSITIVITY)  TROPONIN I (HIGH SENSITIVITY)   ____________________________________________  EKG   ____________________________________________  RADIOLOGY  ED MD interpretation: Chest and abdominal CT read by radiology reviewed by me do not show any acute problems  Official radiology report(s): Ct Angio Chest Pe W And/or Wo Contrast  Result Date: 03/22/2019 CLINICAL DATA:  Generalized abdominal and chest pain with shortness of breath. History of alcohol abuse. EXAM: CT ANGIOGRAPHY CHEST CT ABDOMEN AND PELVIS WITH CONTRAST TECHNIQUE: Multidetector CT imaging of the chest was performed using the standard protocol during bolus administration of intravenous  contrast. Multiplanar CT image reconstructions and MIPs were obtained to evaluate the vascular anatomy. Multidetector CT imaging of the abdomen and pelvis was performed using the standard protocol during bolus administration of intravenous contrast. CONTRAST:  87mL OMNIPAQUE IOHEXOL 240 MG/ML SOLN, 156mL ISOVUE-370 IOPAMIDOL (ISOVUE-370) INJECTION 76% COMPARISON:  None. FINDINGS: CTA CHEST FINDINGS Cardiovascular: Contrast injection is sufficient to demonstrate satisfactory opacification of the pulmonary arteries to the segmental level. There is no pulmonary embolus. The main pulmonary artery is within normal limits for size. There is no CT evidence of acute right heart strain. The visualized aorta is normal. There is a normal 3-vessel arch branching pattern. Heart size is normal, without pericardial effusion. Mediastinum/Nodes: No mediastinal, hilar or axillary lymphadenopathy. The visualized thyroid and thoracic esophageal course are unremarkable. Lungs/Pleura: No pulmonary nodules or masses. No pleural effusion or pneumothorax. No focal airspace consolidation. No focal pleural abnormality. Musculoskeletal: Healed  posterior right rib fractures. Review of the MIP images confirms the above findings. CT ABDOMEN and PELVIS FINDINGS Hepatobiliary: Diffuse hypoattenuation of the liver consistent with hepatic steatosis. No biliary dilatation or enhancing liver lesion. Normal hepatic size and contours. Normal gallbladder. Pancreas: Normal contours without ductal dilatation. No peripancreatic fluid collection. Spleen: Normal. Adrenals/Urinary Tract: --Adrenal glands: Normal. --Right kidney/ureter: No hydronephrosis or perinephric stranding. No nephrolithiasis. No obstructing ureteral stones. --Left kidney/ureter: No hydronephrosis or perinephric stranding. No nephrolithiasis. No obstructing ureteral stones. --Urinary bladder: Unremarkable. Stomach/Bowel: --Stomach/Duodenum: No hiatal hernia or other gastric abnormality.  Normal duodenal course and caliber. --Small bowel: No dilatation or inflammation. --Colon: No focal abnormality. --Appendix: Normal. Vascular/Lymphatic: Normal course and caliber of the major abdominal vessels. No abdominal or pelvic lymphadenopathy. Reproductive: Normal prostate and seminal vesicles. Small bilateral inguinal hernias without contained bowel. Musculoskeletal. 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 Other: None. IMPRESSION: 1. No pulmonary embolus or acute thoracic abnormality. 2. Hepatic steatosis without acute abnormality of the abdomen or pelvis. Electronically Signed   By: Deatra RobinsonKevin  Herman M.D.   On: 03/22/2019 22:00   Ct Abdomen Pelvis W Contrast  Result Date: 03/22/2019 CLINICAL DATA:  Generalized abdominal and chest pain with shortness of breath. History of alcohol abuse. EXAM: CT ANGIOGRAPHY CHEST CT ABDOMEN AND PELVIS WITH CONTRAST TECHNIQUE: Multidetector CT imaging of the chest was performed using the standard protocol during bolus administration of intravenous contrast. Multiplanar CT image reconstructions and MIPs were obtained to evaluate the vascular anatomy. Multidetector CT imaging of the abdomen and pelvis was performed using the standard protocol during bolus administration of intravenous contrast. CONTRAST:  50mL OMNIPAQUE IOHEXOL 240 MG/ML SOLN, 100mL ISOVUE-370 IOPAMIDOL (ISOVUE-370) INJECTION 76% COMPARISON:  None. FINDINGS: CTA CHEST FINDINGS Cardiovascular: Contrast injection is sufficient to demonstrate satisfactory opacification of the pulmonary arteries to the segmental level. There is no pulmonary embolus. The main pulmonary artery is within normal limits for size. There is no CT evidence of acute right heart strain. The visualized aorta is normal. There is a normal 3-vessel arch branching pattern. Heart size is normal, without pericardial effusion. Mediastinum/Nodes: No mediastinal, hilar or axillary lymphadenopathy. The  visualized thyroid and thoracic esophageal course are unremarkable. Lungs/Pleura: No pulmonary nodules or masses. No pleural effusion or pneumothorax. No focal airspace consolidation. No focal pleural abnormality. Musculoskeletal: Healed posterior right rib fractures. Review of the MIP images confirms the above findings. CT ABDOMEN and PELVIS FINDINGS Hepatobiliary: Diffuse hypoattenuation of the liver consistent with hepatic steatosis. No biliary dilatation or enhancing liver lesion. Normal hepatic size and contours. Normal gallbladder. Pancreas: Normal contours without ductal dilatation. No peripancreatic fluid collection. Spleen: Normal. Adrenals/Urinary Tract: --Adrenal glands: Normal. --Right kidney/ureter: No hydronephrosis or perinephric stranding. No nephrolithiasis. No obstructing ureteral stones. --Left kidney/ureter: No hydronephrosis or perinephric stranding. No nephrolithiasis. No obstructing ureteral stones. --Urinary bladder: Unremarkable. Stomach/Bowel: --Stomach/Duodenum: No hiatal hernia or other gastric abnormality. Normal duodenal course and caliber. --Small bowel: No dilatation or inflammation. --Colon: No focal abnormality. --Appendix: Normal. Vascular/Lymphatic: Normal course and caliber of the major abdominal vessels. No abdominal or pelvic lymphadenopathy. Reproductive: Normal prostate and seminal vesicles. Small bilateral inguinal hernias without contained bowel. Musculoskeletal. 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 Other: None. IMPRESSION: 1. No pulmonary embolus or acute thoracic abnormality. 2. Hepatic steatosis without acute abnormality of the abdomen or pelvis. Electronically Signed   By: Deatra RobinsonKevin  Herman M.D.   On: 03/22/2019 22:00    ____________________________________________   PROCEDURES  Procedure(s) performed (including  Critical Care):  Procedures   ____________________________________________   INITIAL IMPRESSION /  ASSESSMENT AND PLAN / ED COURSE  CT and blood work look okay.  AG still pending.              ____________________________________________   FINAL CLINICAL IMPRESSION(S) / ED DIAGNOSES  Final diagnoses:  Chest pain, unspecified type  Abdominal pain, unspecified abdominal location  Depression, unspecified depression type     ED Discharge Orders    None       Note:  This document was prepared using Dragon voice recognition software and may include unintentional dictation errors.    Arnaldo NatalMalinda, Nochum Fenter F, MD 03/22/19 [id number]

## 2019-03-22 NOTE — ED Triage Notes (Signed)
Pt arrived via EMS from home where pt called out to be taken to "mental health unit" due to having SI and HI thoughts. Pt is calm and cooperative in triage. Pt sts he has had 1 beer today.

## 2019-03-22 NOTE — ED Notes (Addendum)
Attempted to reach [employer], pt's mother and legal guardian, at the number [phone], but it was not in service. Pt says his mother got a new number recently and he can't remember it. Also attempted to reach pt's guardian at [phone], the # provided in demographics, but it rang and rang without going to voice mail. Pt says his guardian/mother knows he is here. "She's the one who called." Will continue efforts to reach her.

## 2019-03-22 NOTE — ED Notes (Signed)
Pt arrives with complaints of stomach pain. He endorses SI w/o a plan but denies HI/AVH. Pt is due for a CT but is requiring much encouragement to get him to drink the fluids needed for CT. He remains safe in a hallway bed. Will continue to monitor for needs/safety.

## 2019-03-23 DIAGNOSIS — F1429 Cocaine dependence with unspecified cocaine-induced disorder: Secondary | ICD-10-CM

## 2019-03-23 DIAGNOSIS — R45851 Suicidal ideations: Secondary | ICD-10-CM

## 2019-03-23 DIAGNOSIS — F1094 Alcohol use, unspecified with alcohol-induced mood disorder: Secondary | ICD-10-CM

## 2019-03-23 DIAGNOSIS — F329 Major depressive disorder, single episode, unspecified: Secondary | ICD-10-CM

## 2019-03-23 LAB — TROPONIN I (HIGH SENSITIVITY): Troponin I (High Sensitivity): 5 ng/L (ref <18)

## 2019-03-23 LAB — SARS CORONAVIRUS 2 BY RT PCR (HOSPITAL ORDER, PERFORMED IN ~~LOC~~ HOSPITAL LAB): SARS Coronavirus 2: NEGATIVE

## 2019-03-23 MED ORDER — ONDANSETRON 4 MG PO TBDP
4.0000 mg | ORAL_TABLET | Freq: Once | ORAL | Status: AC
  Administered 2019-03-23: 4 mg via ORAL
  Filled 2019-03-23: qty 1

## 2019-03-23 NOTE — ED Provider Notes (Signed)
Patient cleared by psychiatry for discharge home.   Alfred Levins, [place], MD 03/23/19 [phone]

## 2019-03-23 NOTE — ED Notes (Signed)
He can be heard coughing from the hallway bed - mask provided to him by tech   NAD observed

## 2019-03-23 NOTE — ED Notes (Signed)
BEHAVIORAL HEALTH ROUNDING Patient sleeping: No. Patient alert and oriented: yes Behavior appropriate: Yes.  ; If no, describe:  Nutrition and fluids offered: yes Toileting and hygiene offered: Yes  Sitter present: q15 minute observations and security  monitoring Law enforcement present: Yes  ODS  

## 2019-03-23 NOTE — ED Notes (Signed)

## 2019-03-23 NOTE — Consult Note (Signed)
Telepsych Consultation   Reason for Consult:  Suicidal ideations Referring Physician:  EDP Location of Patient:  Location of Provider: [facility]  Patient Identification: Alex [name] MRN:  [id number] Principal Diagnosis: Depressive disorder Diagnosis:  Principal Problem:   Unspecified Depressive disorder Active Problems:   Alcohol dependence with unspecified alcohol-induced disorder ([place])   Cocaine use disorder, moderate, dependence ([place])   Total Time spent with patient: 30 minutes  Subjective:   Alex [name] is a 43 y.o. male patient reports that he came to the emergency room because he was having some belly pain and has not been feeling well.  Patient reports that his just recently got started back on his medications and these are prescribed by his PCP.  He denies having a psychiatrist or therapist.  Patient is asked if [facility] and patient reports that he would prefer to stay in the hospital.  Patient denies any homicidal ideations and denies any hallucinations.  Patient is asked about suicidal ideations and he reports "well kinda, sometimes."  Patient then lives at the monitor and states that he is not ready to leave yet because his stomach still does not feel good.  He is informed that that is up to the medical provider at the ED and that I am clearing him from the psychiatric standpoint.  Patient then states that he wants to go back to bed and he is okay with that.  HPI:  43 y.o. male presented as alert and oriented. He confirmed that he currently lives with his mother and she is currently his legal guardian. When questioned about why the patient presented to the ER on today he states I didn't feel good my stomach and chest hurt. Patient continued to elaborate I wanted to hurt myself and I figured I'd just get over. When discussing onset of suicidal thoughts patient states "I've always felt that way." Pt. denies any suicidal, plan or  intent.  Patient is unable to identify the last time in which she was free of suicidal thoughts. He denies having any current plan and admits to previous attempts. He reports previous psychiatric hospitalizations although they were in the distant past. Patient shares that he's been without his psychiatric medication for approximately one year do to financial constraints. He admi to drinking alcohol daily and states he also uses marijuana and cocaine. He is unable to quantify amounts or frequency of use. Pt. denies the presence of any auditory or visual hallucinations at this time. Patient denies any other medical complaints.  Patient is seen by me via tele-psych and have consulted with Dr. Dwyane Dee.  Patient seems to be seeking secondary gain from the hospital.  Patient has extremely vague SI and continues reporting that he just wants to stay at the hospital.  Patient did have a BAL of 172 and was positive for cocaine and THC.  At this time the patient does not meet criteria for inpatient hospitalization and is psychiatrically cleared.  I have contacted Dr. Alfred Levins and notified her of the recommendations.  Past Psychiatric History: alcohol dependence, cocaine dependence, depression  Risk to Self: Suicidal Ideation: Yes-Currently Present Suicidal Intent: No-Not Currently/Within Last 6 Months Is patient at risk for suicide?: Yes Suicidal Plan?: No What has been your use of drugs/alcohol within the last 12 months?: Alcohol and cocaine use  How many times?: (States "a Couple" ) Other Self Harm Risks: non e Triggers for Past Attempts: Unknown Intentional Self Injurious Behavior: None Risk  to Others: Homicidal Ideation: No Thoughts of Harm to Others: No Current Homicidal Intent: No Current Homicidal Plan: No Access to Homicidal Means: No Identified Victim: none History of harm to others?: No Assessment of Violence: None Noted Violent Behavior Description: none  Does patient have access to weapons?:  No Criminal Charges Pending?: No Does patient have a court date: No Prior Inpatient Therapy: Prior Inpatient Therapy: Yes Prior Therapy Dates: Distant past Prior Therapy Facilty/Provider(s): Unknown  Reason for Treatment: Depressiona and SI  Prior Outpatient Therapy: Prior Outpatient Therapy: No Does patient have an ACCT team?: No Does patient have Intensive In-House Services?  : No Does patient have [facility] services? : No Does patient have P4CC services?: No  Past Medical History:  Past Medical History:  Diagnosis Date  . Alcohol abuse   . Anxiety   . Cocaine abuse ([facility]) 05/27/2016  . Depression   . Seizures ([facility])     Past Surgical History:  Procedure Laterality Date  . FINGER SURGERY     Family History: History reviewed. No pertinent family history. Family Psychiatric  History: none reported Social History:  Social History   Substance and Sexual Activity  Alcohol Use Yes  . Alcohol/week: 4.0 - 5.0 standard drinks  . Types: 4 - 5 Cans of beer per week   Comment: Pt states that last drink was 3-4 days ago     Social History   Substance and Sexual Activity  Drug Use Yes  . Types: Marijuana, Cocaine   Comment: cocaine last used on friday    Social History   Socioeconomic History  . Marital status: Single    Spouse name: Not on file  . Number of children: Not on file  . Years of education: Not on file  . Highest education level: Not on file  Occupational History  . Not on file  Social Needs  . Financial resource strain: Not on file  . Food insecurity    Worry: Not on file    Inability: Not on file  . Transportation needs    Medical: Not on file    Non-medical: Not on file  Tobacco Use  . Smoking status: Current Every Day Smoker    Packs/day: 1.00    Types: Cigarettes  . Smokeless tobacco: Never Used  Substance and Sexual Activity  . Alcohol use: Yes    Alcohol/week: 4.0 - 5.0 standard drinks    Types: 4 - 5 Cans of beer per week    Comment: Pt states  that last drink was 3-4 days ago  . Drug use: Yes    Types: Marijuana, Cocaine    Comment: cocaine last used on friday  . Sexual activity: Not on file  Lifestyle  . Physical activity    Days per week: Not on file    Minutes per session: Not on file  . Stress: Not on file  Relationships  . Social Musicianconnections    Talks on phone: Not on file    Gets together: Not on file    Attends religious service: Not on file    Active member of club or organization: Not on file    Attends meetings of clubs or organizations: Not on file    Relationship status: Not on file  Other Topics Concern  . Not on file  Social History Narrative  . Not on file   Additional Social History:    Allergies:   Allergies  Allergen Reactions  . Peanut Butter Flavor  Labs:  Results for orders placed or performed during the hospital encounter of 03/22/19 (from the past 48 hour(s))  Comprehensive metabolic panel     Status: Abnormal   Collection Time: 03/22/19  8:02 PM  Result Value Ref Range   Sodium 132 (L) 135 - 145 mmol/L   Potassium 4.1 3.5 - 5.1 mmol/L   Chloride 96 (L) 98 - 111 mmol/L   CO2 21 (L) 22 - 32 mmol/L   Glucose, Bld 92 70 - 99 mg/dL   BUN 6 6 - 20 mg/dL   Creatinine, Ser 1.610.61 0.61 - 1.24 mg/dL   Calcium 9.0 8.9 - 09.610.3 mg/dL   Total Protein 8.0 6.5 - 8.1 g/dL   Albumin 4.2 3.5 - 5.0 g/dL   AST 045200 (H) 15 - 41 U/L   ALT 189 (H) 0 - 44 U/L   Alkaline Phosphatase 98 38 - 126 U/L   Total Bilirubin 0.8 0.3 - 1.2 mg/dL   GFR calc non Af Amer >60 >60 mL/min   GFR calc Af Amer >60 >60 mL/min   Anion gap 15 5 - 15    Comment: Performed at [facility], [address] [place], [place] [address]  Ethanol     Status: Abnormal   Collection Time: 03/22/19  8:02 PM  Result Value Ref Range   Alcohol, Ethyl (B) 172 (H) <10 mg/dL    Comment: (NOTE) Lowest detectable limit for serum alcohol is 10 mg/dL. For medical purposes only. Performed at [facility], [address] [place], [place] [address]   Salicylate level     Status: None   Collection Time: 03/22/19  8:02 PM  Result Value Ref Range   Salicylate Lvl <7.0 2.8 - 30.0 mg/dL    Comment: Performed at [facility], [address] [place], [place] [address]  Acetaminophen level     Status: Abnormal   Collection Time: 03/22/19  8:02 PM  Result Value Ref Range   Acetaminophen (Tylenol), Serum <10 (L) 10 - 30 ug/mL    Comment: (NOTE) Therapeutic concentrations vary significantly. A range of 10-30 ug/mL  may be an effective concentration for many patients. However, some  are best treated at concentrations outside of this range. Acetaminophen concentrations >150 ug/mL at 4 hours after ingestion  and >50 ug/mL at 12 hours after ingestion are often associated with  toxic reactions. Performed at [facility], [address] [place], [place] [address]   cbc     Status: None   Collection Time: 03/22/19  8:02 PM  Result Value Ref Range   WBC 4.2 4.0 - 10.5 K/uL   RBC 4.69 4.22 - 5.81 MIL/uL   Hemoglobin 15.4 13.0 - 17.0 g/dL   HCT 65.744.8 84.639.0 - 96.252.0 %   MCV 95.5 80.0 - 100.0 fL   MCH 32.8 26.0 - 34.0 pg   MCHC 34.4 30.0 - 36.0 g/dL   RDW 95.213.8 84.111.5 - 32.415.5 %   Platelets 169 150 - 400 K/uL   nRBC 0.0 0.0 - 0.2 %    Comment: Performed at [facility], [address] [place], [place] [address]  Urine Drug Screen, Qualitative     Status: Abnormal   Collection Time: 03/22/19  8:02 PM  Result Value Ref Range   Tricyclic, Ur Screen NONE DETECTED NONE DETECTED   Amphetamines, Ur Screen NONE DETECTED NONE DETECTED   MDMA (Ecstasy)Ur Screen NONE DETECTED NONE DETECTED   Cocaine Metabolite,Ur [place] POSITIVE (A) NONE DETECTED  Opiate, Ur Screen NONE DETECTED NONE DETECTED   Phencyclidine (PCP) Ur S NONE DETECTED NONE DETECTED   Cannabinoid 50 Ng, Ur [place] POSITIVE (A) NONE DETECTED   Barbiturates, Ur Screen NONE DETECTED NONE DETECTED   Benzodiazepine, Ur Scrn NONE DETECTED NONE  DETECTED   Methadone Scn, Ur NONE DETECTED NONE DETECTED    Comment: (NOTE) Tricyclics + metabolites, urine    Cutoff 1000 ng/mL Amphetamines + metabolites, urine  Cutoff 1000 ng/mL MDMA (Ecstasy), urine              Cutoff 500 ng/mL Cocaine Metabolite, urine          Cutoff 300 ng/mL Opiate + metabolites, urine        Cutoff 300 ng/mL Phencyclidine (PCP), urine         Cutoff 25 ng/mL Cannabinoid, urine                 Cutoff 50 ng/mL Barbiturates + metabolites, urine  Cutoff 200 ng/mL Benzodiazepine, urine              Cutoff 200 ng/mL Methadone, urine                   Cutoff 300 ng/mL The urine drug screen provides only a preliminary, unconfirmed analytical test result and should not be used for non-medical purposes. Clinical consideration and professional judgment should be applied to any positive drug screen result due to possible interfering substances. A more specific alternate chemical method must be used in order to obtain a confirmed analytical result. Gas chromatography / mass spectrometry (GC/MS) is the preferred confirmat ory method. Performed at [facility], [address] [place], [place] [address]   Lipase, blood     Status: None   Collection Time: 03/22/19  8:02 PM  Result Value Ref Range   Lipase 50 11 - 51 U/L    Comment: Performed at [facility], [address] [place], [place] [address]  Troponin I (High Sensitivity)     Status: None   Collection Time: 03/22/19  8:02 PM  Result Value Ref Range   Troponin I (High Sensitivity) 5 <18 ng/L    Comment: (NOTE) Elevated high sensitivity troponin I (hsTnI) values and significant  changes across serial measurements may suggest ACS but many other  chronic and acute conditions are known to elevate hsTnI results.  Refer to the "Links" section for chest pain algorithms and additional  guidance. Performed at [facility], [address] [place], [place] [address]     Medications:   Current Facility-Administered Medications  Medication Dose Route Frequency Provider Last Rate Last Dose  . citalopram (CELEXA) tablet 40 mg  40 mg Oral Daily Arnaldo Natal, MD      . multivitamin with minerals tablet 1 tablet  1 tablet Oral Daily Arnaldo Natal, MD   1 tablet at 03/22/19 2334  . pantoprazole (PROTONIX) EC tablet 40 mg  40 mg Oral Daily Arnaldo Natal, MD   40 mg at 03/22/19 2334  . traZODone (DESYREL) tablet 150 mg  150 mg Oral QHS PRN Arnaldo Natal, MD   150 mg at 03/22/19 2334  . valproic acid (DEPAKENE) 250 MG capsule 750 mg  750 mg Oral QHS Arnaldo Natal, MD   750 mg at 03/22/19 2334   Current Outpatient Medications  Medication Sig Dispense Refill  . citalopram (CELEXA) 40 MG tablet Take 40 mg by mouth daily.  5  . Multiple Vitamin (  MULTIVITAMIN) tablet Take 1 tablet by mouth daily.    Marland Kitchen. omeprazole (PRILOSEC) 20 MG capsule Take 20 mg by mouth 2 (two) times daily before a meal.    . valproic acid (DEPAKENE) 250 MG capsule Take 3 capsules (750 mg total) by mouth at bedtime. 90 capsule 2    Musculoskeletal: Strength & Muscle Tone: within normal limits Gait & Station: normal Patient leans: N/A  Psychiatric Specialty Exam: Physical Exam  Nursing note and vitals reviewed. Constitutional: He is oriented to person, place, and time. He appears well-developed and well-nourished.  Cardiovascular: Normal rate.  Respiratory: Effort normal.  Musculoskeletal: Normal range of motion.  Neurological: He is alert and oriented to person, place, and time.  Skin: Skin is warm.    Review of Systems  Constitutional: Negative.   HENT: Negative.   Eyes: Negative.   Respiratory: Negative.   Cardiovascular: Negative.   Gastrointestinal: Positive for abdominal pain.  Genitourinary: Negative.   Musculoskeletal: Negative.   Skin: Negative.   Neurological: Negative.   Endo/Heme/Allergies: Negative.   Psychiatric/Behavioral: Positive for depression and suicidal ideas  (extremely vague).    Blood pressure (!) 150/91, pulse 76, temperature 98.8 F (37.1 C), temperature source Oral, resp. rate 20, SpO2 98 %.There is no height or weight on file to calculate BMI.  General Appearance: Casual  Eye Contact:  Fair  Speech:  Clear and Coherent and Normal Rate  Volume:  Normal  Mood:  Irritable  Affect:  Congruent  Thought Process:  Coherent and Descriptions of Associations: Intact  Orientation:  Full (Time, Place, and Person)  Thought Content:  WDL  Suicidal Thoughts:  Yes.  without intent/plan, extremely vague  Homicidal Thoughts:  No  Memory:  Immediate;   Good Recent;   Good Remote;   Good  Judgement:  Fair  Insight:  Fair  Psychomotor Activity:  Normal  Concentration:  Concentration: Good and Attention Span: Good  Recall:  Good  Fund of Knowledge:  Good  Language:  Good  Akathisia:  No  Handed:  Right  AIMS (if indicated):     Assets:  Communication Skills Desire for Improvement Financial Resources/Insurance Housing Resilience Social Support Transportation  ADL's:  Intact  Cognition:  WNL  Sleep:        Treatment Plan Summary: Follow-up with [facility] for outpatient resources  Continue home medications Stop using alcohol and cocaine  Disposition: No evidence of imminent risk to self or others at present.   Patient does not meet criteria for psychiatric inpatient admission. Supportive therapy provided about ongoing stressors. Discussed crisis plan, support from social network, calling 911, coming to the Emergency Department, and calling Suicide Hotline.  This service was provided via telemedicine using a 2-way, interactive audio and video technology.  Names of all persons participating in this telemedicine service and their role in this encounter. Name: Alex [name] Role: Patient  Name: Reola Calkinsravis Annastacia Duba NP Role: Provider  Name:  Role:   Name:  Role:     Maryfrances Bunnellravis B Allyiah Gartner, FNP 03/23/2019 2:59 AM

## 2019-03-23 NOTE — Discharge Instructions (Addendum)
You have been seen in the Emergency Department (ED)  today for a psychiatric complaint.  You have been evaluated by psychiatry and we believe you are safe to be discharged from the hospital.   ° °Please return to the Emergency Department (ED)  immediately if you have ANY thoughts of hurting yourself or anyone else, so that we may help you. ° °Please avoid alcohol and drug use. ° °Follow up with your doctor and/or therapist as soon as possible regarding today's ED  visit.  ° °You may call crisis hotline for [place] at [phone]. ° °

## 2019-03-23 NOTE — BH Assessment (Signed)
Per the request of Psych Nurse Practitioner, writer attempt to informed patient mother he was discharging.  Writer called and left a HIPPA Compliant message with mother Edwena Felty Faucette-[phone]) requesting a return phone call.  The mother's other number ([phone]) wouldn't allow writer to leave a message.

## 2019-03-23 NOTE — BH Assessment (Signed)
Writer called and spoke with patient's mother Legrand Rams Faucette-[phone]) and informed her the patient is going to be discharged. She stated she didn't have a way to pick him up.

## 2019-03-23 NOTE — ED Notes (Signed)
Called Gaynelle Cage NP regarding [place]. He said he would call back in a few minutes.

## 2019-03-23 NOTE — ED Notes (Signed)
ED  Is the patient under IVC or is there intent for IVC:  voluntary Is the patient medically cleared: Yes.   Is there vacancy in the ED BHU:  Unit closed   Is the population mix appropriate for patient:    Is the patient awaiting placement in inpatient or outpatient setting:   Has the patient had a psychiatric consult: Yes.   Discharge pending notification of his legal guardian - his mother   I called and left a HIPPA compliant message this am  Survey of unit performed for contraband, proper placement and condition of furniture, tampering with fixtures in bathroom, shower, and each patient room: Yes.  ; Findings:  APPEARANCE/BEHAVIOR Calm and cooperative NEURO ASSESSMENT Orientation: oriented x3  Denies pain Hallucinations: No.None noted (Hallucinations)  denies Speech: Normal Gait: normal RESPIRATORY ASSESSMENT Even  Unlabored respirations  CARDIOVASCULAR ASSESSMENT Pulses equal   regular rate  Skin warm and dry   GASTROINTESTINAL ASSESSMENT nausea EXTREMITIES Full ROM  PLAN OF CARE Provide calm/safe environment. Vital signs assessed twice daily. ED BHU Assessment once each 12-hour shift. Collaborate with TTS if available   Assure the ED provider has rounded once each shift. Provide and encourage hygiene. Provide redirection as needed. Assess for escalating behavior; address immediately and inform ED provider.  Assess family dynamic and appropriateness for visitation as needed: Yes.  ; If necessary, describe findings:  Educate the patient/family about BHU procedures/visitation: Yes.  ; If necessary, describe findings:

## 2019-03-23 NOTE — ED Notes (Signed)
Pt spoke with TTS 

## 2019-05-18 ENCOUNTER — Other Ambulatory Visit: Payer: Self-pay

## 2019-05-18 ENCOUNTER — Encounter: Payer: Self-pay | Admitting: Emergency Medicine

## 2019-05-18 ENCOUNTER — Emergency Department
Admission: EM | Admit: 2019-05-18 | Discharge: 2019-05-18 | Disposition: A | Discharge status: Home / Self Care | Payer: HRSA Program | Attending: Student | Admitting: Student

## 2019-05-18 ENCOUNTER — Emergency Department: Payer: HRSA Program

## 2019-05-18 DIAGNOSIS — Z0289 Encounter for other administrative examinations: Secondary | ICD-10-CM | POA: Diagnosis not present

## 2019-05-18 DIAGNOSIS — Z79899 Other long term (current) drug therapy: Secondary | ICD-10-CM | POA: Diagnosis not present

## 2019-05-18 DIAGNOSIS — F17210 Nicotine dependence, cigarettes, uncomplicated: Secondary | ICD-10-CM | POA: Insufficient documentation

## 2019-05-18 DIAGNOSIS — R05 Cough: Secondary | ICD-10-CM | POA: Insufficient documentation

## 2019-05-18 DIAGNOSIS — R0602 Shortness of breath: Secondary | ICD-10-CM | POA: Insufficient documentation

## 2019-05-18 DIAGNOSIS — Z139 Encounter for screening, unspecified: Secondary | ICD-10-CM

## 2019-05-18 DIAGNOSIS — Z20822 Contact with and (suspected) exposure to covid-19: Secondary | ICD-10-CM

## 2019-05-18 DIAGNOSIS — R509 Fever, unspecified: Secondary | ICD-10-CM | POA: Diagnosis not present

## 2019-05-18 DIAGNOSIS — Z20828 Contact with and (suspected) exposure to other viral communicable diseases: Secondary | ICD-10-CM | POA: Insufficient documentation

## 2019-05-18 LAB — SARS CORONAVIRUS 2 BY RT PCR (HOSPITAL ORDER, PERFORMED IN ~~LOC~~ HOSPITAL LAB): SARS Coronavirus 2: NEGATIVE

## 2019-05-18 NOTE — Discharge Instructions (Addendum)
Follow-up with your primary care provider if any continued problems.  Also read the information about quitting smoking.  Your COVID test is negative

## 2019-05-18 NOTE — ED Triage Notes (Signed)
Patient here in custody of [place] [employer]. Patient needs COVID test prior to going to jail. Reports exposure to positive patient. Reports cough and SOB. In forensic restraints at this time.

## 2019-05-18 NOTE — ED Provider Notes (Signed)
[facility] Emergency Department Provider Note  ____________________________________________   First MD Initiated Contact with Patient 05/18/19 1025     (approximate)  I have reviewed the triage vital signs and the nursing notes.   HISTORY  Chief Complaint Medical Clearance   HPI Alex HousemanBrian J [name] is a 43 y.o. male is to the ED via however [employer] in custody.  A COVID test as needed before going to jail.  Patient [name] that he was exposed to a COVID positive  Person.   He states 4 days ago he developed a cough and shortness of breath and subjective fever.  Patient also [name] that he smokes 1 pack of cigarettes per day and drinks alcohol.     Past Medical History:  Diagnosis Date  . Alcohol abuse   . Anxiety   . Cocaine abuse (HCC) 05/27/2016  . Depression   . Seizures [facility](HCC)     Patient Active Problem List   Diagnosis Date Noted  . Substance induced mood disorder (HCC) 12/30/2016  . Moderate recurrent major depression (HCC) 12/30/2016  . Cocaine abuse (HCC)   . ADHD 08/11/2016  . Alcohol dependence with unspecified alcohol-induced disorder (HCC) 06/28/2016  . Alcohol withdrawal (HCC) 06/28/2016  . Cocaine use disorder, moderate, dependence (HCC) 06/28/2016  . Unspecified Depressive disorder 06/28/2016  . Tobacco use disorder 06/28/2016    Past Surgical History:  Procedure Laterality Date  . FINGER SURGERY      Prior to Admission medications   Medication Sig Start Date End Date Taking? Authorizing Provider  citalopram (CELEXA) 40 MG tablet Take 40 mg by mouth daily. 02/27/18   [provider]  Multiple Vitamin (MULTIVITAMIN) tablet Take 1 tablet by mouth daily.    [provider]  omeprazole (PRILOSEC) 20 MG capsule Take 20 mg by mouth 2 (two) times daily before a meal.    [provider]  valproic acid (DEPAKENE) 250 MG capsule Take 3 capsules (750 mg total) by mouth at bedtime. 07/11/18 07/11/19  [name], Cory, MD     Allergies Peanut butter flavor  No family history on file.  Social History Social History   Tobacco Use  . Smoking status: Current Every Day Smoker    Packs/day: 1.00    Types: Cigarettes  . Smokeless tobacco: Never Used  Substance Use Topics  . Alcohol use: Yes    Alcohol/week: 4.0 - 5.0 standard drinks    Types: 4 - 5 Cans of beer per week    Comment: Pt states that last drink was 3-4 days ago  . Drug use: Yes    Types: Marijuana, Cocaine    Comment: cocaine last used on friday    Review of Systems Constitutional: No fever/chills Cardiovascular: Denies chest pain. Respiratory: Reported shortness of breath.  Positive for cough. Gastrointestinal: No abdominal pain.  No nausea, no vomiting.  Genitourinary: Negative for dysuria. Musculoskeletal: Negative for muscle aches. Skin: Negative for rash. Neurological: Negative for headaches, focal weakness or numbness. ____________________________________________   PHYSICAL EXAM:  VITAL SIGNS: ED Triage Vitals  Enc Vitals Group     BP 05/18/19 0959 139/89     Pulse Rate 05/18/19 0959 83     Resp 05/18/19 0959 18     Temp 05/18/19 0959 98 F (36.7 C)     Temp Source 05/18/19 0959 Oral     SpO2 05/18/19 0959 94 %     Weight 05/18/19 1000 200 lb (90.7 kg)     Height 05/18/19 1000 5\' 4"  (  1.626 m)     Head Circumference --      Peak Flow --      Pain Score 05/18/19 1000 10     Pain Loc --      Pain Edu? --      Excl. in [place]? --     Constitutional: Alert and oriented. Well appearing and in no acute distress. Eyes: Conjunctivae are normal. PERRL. EOMI. Head: Atraumatic. Nose: No congestion/rhinnorhea. Neck: No stridor.   Cardiovascular: Normal rate, regular rhythm. Grossly normal heart sounds.  Good peripheral circulation. Respiratory: Normal respiratory effort.  No retractions. Lungs CTAB. Musculoskeletal: No lower extremity tenderness nor edema.  No joint effusions. Neurologic:  Normal speech and language. No gross  focal neurologic deficits are appreciated. No gait instability. Skin:  Skin is warm, dry and intact. No rash noted. Psychiatric: Mood and affect are normal. Speech and behavior are normal.  ____________________________________________   LABS (all labs ordered are listed, but only abnormal results are displayed)  Labs Reviewed  SARS CORONAVIRUS 2 ([place] [facility])   RADIOLOGY  Official radiology report(s): Dg Chest Portable 1 View  Result Date: 05/18/2019 CLINICAL DATA:  Exposure to COVID-19 patient. Cough and shortness of breath. EXAM: PORTABLE CHEST 1 VIEW COMPARISON:  July 10, 2018 FINDINGS: The heart size and mediastinal contours are within normal limits. Both lungs are clear. The visualized skeletal structures are unremarkable. IMPRESSION: No active disease. Electronically Signed   By: Dorise Bullion III M.D   On: 05/18/2019 11:27    ____________________________________________   PROCEDURES  Procedure(s) performed (including Critical Care):  Procedures   ____________________________________________   INITIAL IMPRESSION / ASSESSMENT AND PLAN / ED COURSE  As part of my medical decision making, I reviewed the following data within the electronic MEDICAL RECORD NUMBER Notes from prior ED visits and [place] Controlled Substance Database   Clinical Course as of May 17 1554  Fri May 18, 2019  1220 SARS Coronavirus 2: NEGATIVE [RS]    Clinical Course User Index [RS] [name], PA-C   [name] was evaluated in Emergency Department on 05/18/2019 for the symptoms described in the history of present illness. He was evaluated in the context of the global COVID-19 pandemic, which necessitated consideration that the patient might be at risk for infection with the SARS-CoV-2 virus that causes COVID-19. Institutional protocols and algorithms that pertain to the evaluation of patients at risk for COVID-19 are in a state of rapid change based  on information released by regulatory bodies including the CDC and federal and state organizations. These policies and algorithms were followed during the patient's care in the ED.  43 year old male is brought to the ED by law enforcement for a COVID test prior to going to jail.  Patient states that he was exposed to someone that he knows was positive.  He now complains to the officer that he has had a cough and shortness of breath.  Patient is afebrile in the ED and no coughing was noted while he was present.  COVID test was negative and officer was given a copy of the results so that patient would be allowed in the facility. ____________________________________________   FINAL CLINICAL IMPRESSION(S) / ED DIAGNOSES  Final diagnoses:  Exposure to Covid-19 Virus  Encounter for medical screening examination     ED Discharge Orders    None       Note:  This document was prepared using Dragon voice recognition software and may  include unintentional dictation errors.    Tommi RumpsSummers, Rhonda L, PA-C 05/18/19 1555    Miguel AschoffMonks, Sarah L., MD 05/18/19 [phone]

## 2019-11-05 ENCOUNTER — Ambulatory Visit: Payer: Self-pay | Admitting: *Deleted

## 2019-11-05 NOTE — Telephone Encounter (Signed)
   Reason for Disposition . [1] Chest pain lasts > 5 minutes AND [2] age > 30 AND [3] one or more cardiac risk factors (e.g., diabetes, high blood pressure, high cholesterol, smoker, or strong family history of heart disease)  Answer Assessment - Initial Assessment Questions 1. LOCATION: "Where does it hurt?"       Left side- hurts to take deep breath 2. RADIATION: "Does the pain go anywhere else?" (e.g., into neck, jaw, arms, back)     knees 3. ONSET: "When did the chest pain begin?" (Minutes, hours or days)      This morning 4. PATTERN "Does the pain come and go, or has it been constant since it started?"  "Does it get worse with exertion?"      Constant, worse with exertion 5. DURATION: "How long does it last" (e.g., seconds, minutes, hours)     hours 6. SEVERITY: "How bad is the pain?"  (e.g., Scale 1-10; mild, moderate, or severe)    - MILD (1-3): doesn't interfere with normal activities     - MODERATE (4-7): interferes with normal activities or awakens from sleep    - SEVERE (8-10): excruciating pain, unable to do any normal activities       8 7. CARDIAC RISK FACTORS: "Do you have any history of heart problems or risk factors for heart disease?" (e.g., angina, prior heart attack; diabetes, high blood pressure, high cholesterol, smoker, or strong family history of heart disease)     Smoke 8. PULMONARY RISK FACTORS: "Do you have any history of lung disease?"  (e.g., blood clots in lung, asthma, emphysema, birth control pills)     no 9. CAUSE: "What do you think is causing the chest pain?"     Unknown 10. OTHER SYMPTOMS: "Do you have any other symptoms?" (e.g., dizziness, nausea, vomiting, sweating, fever, difficulty breathing, cough)       No 11. PREGNANCY: "Is there any chance you are pregnant?" "When was your last menstrual period?"       n/a  Protocols used: CHEST PAIN-A-AH

## 2019-12-23 ENCOUNTER — Other Ambulatory Visit: Payer: Self-pay

## 2019-12-23 ENCOUNTER — Encounter: Payer: Self-pay | Admitting: Emergency Medicine

## 2019-12-23 ENCOUNTER — Emergency Department
Admission: EM | Admit: 2019-12-23 | Discharge: 2019-12-23 | Disposition: A | Discharge status: Home / Self Care | Attending: Emergency Medicine | Admitting: Emergency Medicine

## 2019-12-23 DIAGNOSIS — R45851 Suicidal ideations: Secondary | ICD-10-CM | POA: Insufficient documentation

## 2019-12-23 DIAGNOSIS — Z5321 Procedure and treatment not carried out due to patient leaving prior to being seen by health care provider: Secondary | ICD-10-CM | POA: Insufficient documentation

## 2019-12-23 NOTE — ED Triage Notes (Signed)
Pt arrives voluntary to triage with c/o SI thoughts. Pt states that he was going to jump off of the [facility] bridge and has been feeling this way x 2 days.

## 2019-12-23 NOTE — ED Notes (Signed)
Pt deciding to leave at this time. [place] [employer] giving pt ride at this time.

## 2020-03-07 ENCOUNTER — Ambulatory Visit: Payer: Self-pay | Admitting: *Deleted

## 2020-03-07 NOTE — Telephone Encounter (Signed)
Patient calling with complaints of being stung several times by yellow jackets that were in the ground today around 2:30pm.Pt states that he was stung in the legs, foot arm, face(near his eye) and buttocks. Pt states that his right foot is swollen and he is unable to remove the stinger in his foot. Pt rates pain at 9 and has complaints of itching as well. Pt states he called EMS to his home but states "they did not do anything". Pt advised to contact PCP and to seek treatment in the ED/Urgent Care. Pt verbalized understanding.  Reason for Disposition . More than 50 stings  Answer Assessment - Initial Assessment Questions 1. TYPE: "What type of sting was it?" (bee, yellow jacket, etc.)      Yellow  2. ONSET: "When did it occur?"      2:30 today 3. LOCATION: "Where is the sting located?"  "How many stings?"     Face-near eye calf muscle, side of leg and foot, buttocks 4. SWELLING SIZE: "How big is the swelling?" (e.g., inches or cm)     unsure 5. REDNESS: "Is the area red or pink?" If so, ask "What size is area of redness?" (e.g., inches or cm). "When did the redness start?"     yes 6. PAIN: "Is there any pain?" If so, ask: "How bad is it?"  (Scale 1-10; or mild, moderate, severe)    9-10 7. ITCHING: "Is there any itching?" If so, ask: "How bad is it?"      itching 8. RESPIRATORY DISTRESS: "Describe your breathing."     no 9. PRIOR REACTIONS: "Have you had any severe allergic reactions to stings in the past?" if yes, ask: "What happened?"     Has been stung before but not this bad 10. OTHER SYMPTOMS: "Do you have any other symptoms?" (e.g., abdominal pain, face or tongue swelling, new rash elsewhere, vomiting)       no 11. PREGNANCY: "Is there any chance you are pregnant?" "When was your last menstrual period?"       n/a  Protocols used: BEE OR YELLOW JACKET STING-A-AH

## 2020-07-08 ENCOUNTER — Ambulatory Visit
Admission: RE | Admit: 2020-07-08 | Discharge: 2020-07-08 | Disposition: A | Discharge status: Home / Self Care | Payer: Medicaid Other | Source: Ambulatory Visit | Attending: Family Medicine | Admitting: Family Medicine

## 2020-07-08 ENCOUNTER — Other Ambulatory Visit: Payer: Self-pay | Admitting: Family Medicine

## 2020-07-08 ENCOUNTER — Ambulatory Visit
Admission: RE | Admit: 2020-07-08 | Discharge: 2020-07-08 | Disposition: A | Discharge status: Home / Self Care | Payer: Medicaid Other | Attending: Family Medicine | Admitting: Family Medicine

## 2020-07-08 ENCOUNTER — Other Ambulatory Visit: Payer: Self-pay

## 2020-07-08 DIAGNOSIS — M545 Low back pain, unspecified: Secondary | ICD-10-CM

## 2020-07-09 ENCOUNTER — Emergency Department
Admission: EM | Admit: 2020-07-09 | Discharge: 2020-07-09 | Discharge status: Absent without leave | Payer: Medicaid Other

## 2021-01-18 ENCOUNTER — Emergency Department: Payer: Medicaid Other

## 2021-01-18 ENCOUNTER — Emergency Department
Admission: EM | Admit: 2021-01-18 | Discharge: 2021-01-18 | Disposition: A | Discharge status: Home / Self Care | Payer: Medicaid Other | Attending: Emergency Medicine | Admitting: Emergency Medicine

## 2021-01-18 ENCOUNTER — Other Ambulatory Visit: Payer: Self-pay

## 2021-01-18 DIAGNOSIS — F17210 Nicotine dependence, cigarettes, uncomplicated: Secondary | ICD-10-CM | POA: Insufficient documentation

## 2021-01-18 DIAGNOSIS — F10129 Alcohol abuse with intoxication, unspecified: Secondary | ICD-10-CM | POA: Insufficient documentation

## 2021-01-18 DIAGNOSIS — Y908 Blood alcohol level of 240 mg/100 ml or more: Secondary | ICD-10-CM | POA: Insufficient documentation

## 2021-01-18 DIAGNOSIS — Z91010 Allergy to peanuts: Secondary | ICD-10-CM | POA: Insufficient documentation

## 2021-01-18 DIAGNOSIS — F10929 Alcohol use, unspecified with intoxication, unspecified: Secondary | ICD-10-CM

## 2021-01-18 LAB — CBC WITH DIFFERENTIAL/PLATELET
Abs Immature Granulocytes: 0.02 10*3/uL (ref 0.00–0.07)
Basophils Absolute: 0 10*3/uL (ref 0.0–0.1)
Basophils Relative: 0 %
Eosinophils Absolute: 0.3 10*3/uL (ref 0.0–0.5)
Eosinophils Relative: 4 %
HCT: 42.5 % (ref 39.0–52.0)
Hemoglobin: 13.9 g/dL (ref 13.0–17.0)
Immature Granulocytes: 0 %
Lymphocytes Relative: 31 %
Lymphs Abs: 2.2 10*3/uL (ref 0.7–4.0)
MCH: 30.2 pg (ref 26.0–34.0)
MCHC: 32.7 g/dL (ref 30.0–36.0)
MCV: 92.2 fL (ref 80.0–100.0)
Monocytes Absolute: 0.7 10*3/uL (ref 0.1–1.0)
Monocytes Relative: 10 %
Neutro Abs: 3.8 10*3/uL (ref 1.7–7.7)
Neutrophils Relative %: 55 %
Platelets: 200 10*3/uL (ref 150–400)
RBC: 4.61 MIL/uL (ref 4.22–5.81)
RDW: 15.4 % (ref 11.5–15.5)
WBC: 7 10*3/uL (ref 4.0–10.5)
nRBC: 0 % (ref 0.0–0.2)

## 2021-01-18 LAB — BASIC METABOLIC PANEL
Anion gap: 11 (ref 5–15)
BUN: 13 mg/dL (ref 6–20)
CO2: 24 mmol/L (ref 22–32)
Calcium: 8.5 mg/dL — ABNORMAL LOW (ref 8.9–10.3)
Chloride: 107 mmol/L (ref 98–111)
Creatinine, Ser: 0.89 mg/dL (ref 0.61–1.24)
GFR, Estimated: >60 mL/min (ref >60)
Glucose, Bld: 92 mg/dL (ref 70–99)
Potassium: 3.8 mmol/L (ref 3.5–5.1)
Sodium: 142 mmol/L (ref 135–145)

## 2021-01-18 LAB — ETHANOL: Alcohol, Ethyl (B): 332 mg/dL (ref <10)
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
## 2021-01-18 NOTE — ED Notes (Addendum)
Pt ambulatory to bathroom
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
## 2021-01-18 NOTE — ED Notes (Signed)
Fall risk socks and bracelet placed on Pt by this tech.

## 2021-01-18 NOTE — ED Notes (Signed)
Attempted to call both telephone numbers in computer listed for patient's mother, who is patient's legal guardian.  One number has no voice mail set up and the other simply said call could not be completed.

## 2021-01-18 NOTE — ED Notes (Signed)
Got new number from pt. Spoke to Marine scientist (mother).

## 2021-01-18 NOTE — ED Notes (Signed)
Patient unable to sign MSE waiver at this time due to intoxication.

## 2021-01-18 NOTE — ED Notes (Signed)
Patient to CT via stretcher.

## 2021-01-18 NOTE — ED Notes (Signed)
Loraine Leriche, cousin, coming to pick pt up. Pt waiting on a pair of shoes from supply chain. Given coke. Given d/c papers.

## 2021-01-18 NOTE — ED Notes (Signed)
Patient across from Mr Minner being very loud and cursing.  Mr. Poet moved to [facility].

## 2021-04-30 ENCOUNTER — Emergency Department
Admission: EM | Admit: 2021-04-30 | Discharge: 2021-05-01 | Disposition: A | Discharge status: Home / Self Care | Payer: Self-pay | Attending: Emergency Medicine | Admitting: Emergency Medicine

## 2021-04-30 DIAGNOSIS — F10929 Alcohol use, unspecified with intoxication, unspecified: Secondary | ICD-10-CM | POA: Insufficient documentation

## 2021-04-30 DIAGNOSIS — Y92009 Unspecified place in unspecified non-institutional (private) residence as the place of occurrence of the external cause: Secondary | ICD-10-CM | POA: Insufficient documentation

## 2021-04-30 DIAGNOSIS — F1490 Cocaine use, unspecified, uncomplicated: Secondary | ICD-10-CM | POA: Insufficient documentation

## 2021-04-30 DIAGNOSIS — F17210 Nicotine dependence, cigarettes, uncomplicated: Secondary | ICD-10-CM | POA: Insufficient documentation

## 2021-04-30 DIAGNOSIS — S43005A Unspecified dislocation of left shoulder joint, initial encounter: Secondary | ICD-10-CM | POA: Insufficient documentation

## 2021-04-30 DIAGNOSIS — Y93F2 Activity, caregiving, lifting: Secondary | ICD-10-CM | POA: Insufficient documentation

## 2021-04-30 DIAGNOSIS — X500XXA Overexertion from strenuous movement or load, initial encounter: Secondary | ICD-10-CM | POA: Insufficient documentation

## 2021-04-30 DIAGNOSIS — Z91010 Allergy to peanuts: Secondary | ICD-10-CM | POA: Insufficient documentation

## 2021-05-01 ENCOUNTER — Emergency Department: Payer: Self-pay

## 2021-05-01 ENCOUNTER — Other Ambulatory Visit: Payer: Self-pay

## 2021-05-01 ENCOUNTER — Ambulatory Visit: Payer: Self-pay | Admitting: *Deleted

## 2021-05-01 LAB — CBC
HCT: 32.7 % — ABNORMAL LOW (ref 39.0–52.0)
Hemoglobin: 10.5 g/dL — ABNORMAL LOW (ref 13.0–17.0)
MCH: 30.8 pg (ref 26.0–34.0)
MCHC: 32.1 g/dL (ref 30.0–36.0)
MCV: 95.9 fL (ref 80.0–100.0)
Platelets: 192 10*3/uL (ref 150–400)
RBC: 3.41 MIL/uL — ABNORMAL LOW (ref 4.22–5.81)
RDW: 15.5 % (ref 11.5–15.5)
WBC: 6.7 10*3/uL (ref 4.0–10.5)
nRBC: 0 % (ref 0.0–0.2)

## 2021-05-01 LAB — BASIC METABOLIC PANEL WITH GFR
Anion gap: 12 (ref 5–15)
BUN: 9 mg/dL (ref 6–20)
CO2: 22 mmol/L (ref 22–32)
Calcium: 8.4 mg/dL — ABNORMAL LOW (ref 8.9–10.3)
Chloride: 103 mmol/L (ref 98–111)
Creatinine, Ser: 0.79 mg/dL (ref 0.61–1.24)
GFR, Estimated: >60 mL/min
Glucose, Bld: 117 mg/dL — ABNORMAL HIGH (ref 70–99)
Potassium: 3.7 mmol/L (ref 3.5–5.1)
Sodium: 137 mmol/L (ref 135–145)

## 2021-05-01 LAB — TROPONIN I (HIGH SENSITIVITY): Troponin I (High Sensitivity): 4 ng/L (ref <18)

## 2021-05-01 MED ORDER — HYDROCODONE-ACETAMINOPHEN 5-325 MG PO TABS
1.0000 | ORAL_TABLET | Freq: Once | ORAL | Status: AC
  Administered 2021-05-01: 1 via ORAL
  Filled 2021-05-01: qty 1

## 2021-05-01 MED ORDER — SODIUM CHLORIDE 0.9 % IV BOLUS
1000.0000 mL | Freq: Once | INTRAVENOUS | Status: AC
  Administered 2021-05-01: 1000 mL via INTRAVENOUS

## 2021-05-01 MED ORDER — PROPOFOL 10 MG/ML IV BOLUS
1.0000 mg/kg | Freq: Once | INTRAVENOUS | Status: AC
  Administered 2021-05-01: 60 mg via INTRAVENOUS
  Filled 2021-05-01: qty 20

## 2021-05-01 MED ORDER — LIDOCAINE HCL (PF) 1 % IJ SOLN: 5.0000 mL | Freq: Once | INTRAMUSCULAR | Status: DC

## 2021-05-01 MED ORDER — BUPIVACAINE HCL (PF) 0.5 % IJ SOLN: 10.0000 mL | Freq: Once | INTRAMUSCULAR | Status: DC

## 2021-05-01 NOTE — ED Notes (Signed)
Pt is sleeping and O2 sats 84 and 85% on 2L [place]. Dr. Erma Heritage informed. Not ready for discharge at this time. Will continue to monitor.

## 2021-05-01 NOTE — ED Notes (Signed)
Extra blue and dark red sent to lab

## 2021-05-01 NOTE — Discharge Instructions (Addendum)
Keep your sling on until follow-up  No heavy lifting or use of your arm until cleared  Take tylenol, advil as needed

## 2021-05-01 NOTE — ED Notes (Signed)
Pt will be taking the bus home. Legal guardian attempted to be contacted but was unsuccessful. Pt ambulatory at d/c with independent steady gait.  Pt given shoulder immobilizer which was placed on him by Dr. Erma Heritage after shoulder reduction.   D/C instructions and follow up care reviewed with patient and he verbalized understanding.

## 2021-05-01 NOTE — ED Notes (Signed)
X-ray at bedside

## 2021-05-01 NOTE — ED Notes (Signed)
PER EMS: Was lifting furniture 2230, +deformity L shoulder, pulses intact, admits to using cocaine this morning. GCS 15

## 2021-05-01 NOTE — Telephone Encounter (Signed)
Pt seen in [facility] ED last night for dislocated shoulder. States in severe pain, "They didn't give me anything for pain." Requesting I call in pain meds. Advised I could not do that. Advised to call PCP. States PCP closed today. Advised may have someone on call, other recourse is back to ED, declines. Pt belligerent. Attempted to tranfer call to ED as he requested. Tried  to give him number in case did not transfer.Would not accept. Call transferred.     Answer Assessment - Initial Assessment Questions 1. REASON FOR CALL: "What is your main concern right now?"     Need pain medicine 2. ONSET: "When did the *No Answer* start?"     *No Answer* 3. SEVERITY: "How bad is the *No Answer*?"     *No Answer* 4. FEVER: "Do you have a fever?"     *No Answer* 5. OTHER SYMPTOMS: "Do you have any other new symptoms?"     *No Answer* 6. TREATMENTS AND RESPONSE: "What have you done so far to try to make this better? What medicines have you used?"     *No Answer* 7. PREGNANCY: "Is there any chance you are pregnant?" "When was your last menstrual period?"     *No Answer*  Protocols used: No Guideline Available-A-AH

## 2021-05-01 NOTE — ED Notes (Signed)
140 mg of Propofol wasted with Tory Emerald, RN

## 2021-05-01 NOTE — ED Provider Notes (Signed)
[facility] Emergency Department Provider Note  ____________________________________________   Event Date/Time   First MD Initiated Contact with Patient 05/01/21 0018     (approximate)  I have reviewed the triage vital signs and the nursing notes.   HISTORY  Chief Complaint Shoulder Injury (Was lifting furniture 2230, +deformity L shoulder, pulses intact, admits to using cocaine this morning. GCS 15)    HPI Alex [name] is a 45 y.o. male  with h/o polysubstance abuse here with left shoulder pain. Pt reports that he was moving furniture in his house today when he felt acute onset of severe L shoulder pain. He felt his shoulder pop out of place, pop back in, then out again. He has since had 10/10 pain, aching, severe, worse w/ any movement. No other injuries. No h/o prior shoulder dislocations. He does admit to EtOH and cocaine use today, but "just a little." No alleviating factors. No falls or direct trauma. No numbness or weakness.        Past Medical History:  Diagnosis Date   Alcohol abuse    Anxiety    Cocaine abuse (HCC) 05/27/2016   Depression    Seizures (HCC)     Patient Active Problem List   Diagnosis Date Noted   Substance induced mood disorder (HCC) 12/30/2016   Moderate recurrent major depression (HCC) 12/30/2016   Cocaine abuse (HCC)    ADHD 08/11/2016   Alcohol dependence with unspecified alcohol-induced disorder (HCC) 06/28/2016   Alcohol withdrawal (HCC) 06/28/2016   Cocaine use disorder, moderate, dependence (HCC) 06/28/2016   Unspecified Depressive disorder 06/28/2016   Tobacco use disorder 06/28/2016    Past Surgical History:  Procedure Laterality Date   FINGER SURGERY      Prior to Admission medications   Medication Sig Start Date End Date Taking? Authorizing Provider  TRAZODONE HCL PO Take by mouth.   Yes [provider]  UNKNOWN TO PATIENT Htn med   Yes [provider]  citalopram (CELEXA) 40 MG  tablet Take 40 mg by mouth daily. 02/27/18   [provider]  Multiple Vitamin (MULTIVITAMIN) tablet Take 1 tablet by mouth daily.    [provider]  omeprazole (PRILOSEC) 20 MG capsule Take 20 mg by mouth 2 (two) times daily before a meal.    [provider]  valproic acid (DEPAKENE) 250 MG capsule Take 3 capsules (750 mg total) by mouth at bedtime. 07/11/18 07/11/19  Loleta Rose, MD    Allergies Peanut butter flavor  No family history on file.  Social History Social History   Tobacco Use   Smoking status: Every Day    Packs/day: 1.00    Types: Cigarettes   Smokeless tobacco: Never  Substance Use Topics   Alcohol use: Yes    Alcohol/week: 4.0 - 5.0 standard drinks    Types: 4 - 5 Cans of beer per week    Comment: Pt states that last drink was 3-4 days ago   Drug use: Yes    Types: Marijuana, Cocaine    Comment: cocaine last used on friday    Review of Systems  Review of Systems  Constitutional:  Negative for chills and fever.  HENT:  Negative for sore throat.   Respiratory:  Negative for shortness of breath.   Cardiovascular:  Negative for chest pain.  Gastrointestinal:  Negative for abdominal pain.  Genitourinary:  Negative for flank pain.  Musculoskeletal:  Positive for arthralgias and joint swelling. Negative for neck pain.  Skin:  Negative for rash and wound.  Allergic/Immunologic: Negative for immunocompromised state.  Neurological:  Negative for weakness and numbness.  Hematological:  Does not bruise/bleed easily.  All other systems reviewed and are negative.   ____________________________________________  PHYSICAL EXAM:      VITAL SIGNS: ED Triage Vitals [04/30/21 2359]  Enc Vitals Group     BP 128/85     Pulse Rate 95     Resp 20     Temp 97.8 F (36.6 C)     Temp Source Oral     SpO2 94 %     Weight 169 lb 15.6 oz (77.1 kg)     Height 5\' 7"  (1.702 m)     Head Circumference      Peak Flow      Pain Score 10     Pain  Loc      Pain Edu?      Excl. in GC?      Physical Exam Vitals and nursing note reviewed.  Constitutional:      General: He is not in acute distress.    Appearance: He is well-developed.  HENT:     Head: Normocephalic and atraumatic.  Eyes:     Conjunctiva/sclera: Conjunctivae normal.  Cardiovascular:     Rate and Rhythm: Normal rate and regular rhythm.     Heart sounds: Normal heart sounds.  Pulmonary:     Effort: Pulmonary effort is normal. No respiratory distress.     Breath sounds: No wheezing.  Abdominal:     General: There is no distension.  Musculoskeletal:     Cervical back: Neck supple.     Comments: Obvious deformity L shoulder, with marked pain with pROM. Distal strength, sensation intact LUE. No other bony TTP or deformity.  Skin:    General: Skin is warm.     Capillary Refill: Capillary refill takes less than 2 seconds.     Findings: No rash.  Neurological:     Mental Status: He is alert and oriented to person, place, and time.     Motor: No abnormal muscle tone.      ____________________________________________   LABS (all labs ordered are listed, but only abnormal results are displayed)  Labs Reviewed  BASIC METABOLIC PANEL - Abnormal; Notable for the following components:      Result Value   Glucose, Bld 117 (*)    Calcium 8.4 (*)    All other components within normal limits  CBC - Abnormal; Notable for the following components:   RBC 3.41 (*)    Hemoglobin 10.5 (*)    HCT 32.7 (*)    All other components within normal limits  TROPONIN I (HIGH SENSITIVITY)    ____________________________________________  EKG: Normal sinus rhythm, VR 87. PR 162, QRS 110, QTc 464. No acute ST elevations or depressions. ________________________________________  RADIOLOGY All imaging, including plain films, CT scans, and ultrasounds, independently reviewed by me, and interpretations confirmed via formal radiology reads.  ED MD interpretation:   CXR:  Clear XR Shoulder L: Left GH dislocation  Official radiology report(s): DG Chest Portable 1 View  Result Date: 05/01/2021 CLINICAL DATA:  Chest pain, left shoulder pain EXAM: PORTABLE CHEST 1 VIEW COMPARISON:  05/18/2019 FINDINGS: Lungs volumes are small, but are symmetric and are clear. No pneumothorax or pleural effusion. Cardiac size within normal limits. Pulmonary vascularity is normal. Osseous structures are age-appropriate. Left glenohumeral dislocation noted. IMPRESSION: Pulmonary hypoinflation. Left glenohumeral dislocation. Electronically Signed   By: 05/20/2019 MD  On: 05/01/2021 00:46   DG Shoulder Left  Result Date: 05/01/2021 CLINICAL DATA:  Left shoulder pain and deformity EXAM: LEFT SHOULDER - 2+ VIEW COMPARISON:  None. FINDINGS: Three view radiograph left shoulder demonstrates anteroinferior left glenohumeral dislocation. Hill-Sachs deformity noted within the humeral head. No definite displaced glenoid fracture identified. Acromioclavicular joint space is preserved. No other fracture or dislocation identified. IMPRESSION: Anteroinferior left glenohumeral dislocation. Electronically Signed   By: Helyn NumbersAshesh  Parikh MD   On: 05/01/2021 00:45   DG Shoulder Left Portable  Result Date: 05/01/2021 CLINICAL DATA:  Left shoulder dislocation, post reduction imaging EXAM: LEFT SHOULDER COMPARISON:  None. FINDINGS: Two view radiograph left shoulder demonstrates interval relocation of the left humeral head into the glenoid fossa. Mild superolateral deformity of the humeral head is in keeping with a Hill-Sachs deformity. Posttraumatic deformity of the distal left clavicle again noted with preservation of the a acromioclavicular joint space. No additional fracture identified IMPRESSION: Interval relocation of the left humeral head into the glenoid fossa. No superimposed glenoid fracture is clearly identified. Electronically Signed   By: Helyn NumbersAshesh  Parikh MD   On: 05/01/2021 02:17     ____________________________________________  PROCEDURES   Procedure(s) performed (including Critical Care):  .Sedation  Date/Time: 05/01/2021 3:20 AM Performed by: Shaune PollackIsaacs, Erice Ahles, MD Authorized by: Shaune PollackIsaacs, Chord Takahashi, MD   Consent:    Consent obtained:  Verbal   Consent given by:  Patient   Risks discussed:  Allergic reaction, dysrhythmia, inadequate sedation, nausea, prolonged hypoxia resulting in organ damage, prolonged sedation necessitating reversal, respiratory compromise necessitating ventilatory assistance and intubation and vomiting   Alternatives discussed:  Analgesia without sedation Universal protocol:    Immediately prior to procedure, a time out was called: yes   Indications:    Procedure performed:  Dislocation reduction   Procedure necessitating sedation performed by:  Physician performing sedation Pre-sedation assessment:    Time since last food or drink:  4   NPO status caution: urgency dictates proceeding with non-ideal NPO status     ASA classification: class 2 - patient with mild systemic disease     Mallampati score:  I - soft palate, uvula, fauces, pillars visible   Neck mobility: normal     Pre-sedation assessments completed and reviewed: airway patency, cardiovascular function, hydration status, mental status, nausea/vomiting, pain level, respiratory function and temperature   Immediate pre-procedure details:    Reviewed: vital signs     Verified: bag valve mask available, emergency equipment available, intubation equipment available, IV patency confirmed, oxygen available and reversal medications available   Procedure details (see MAR for exact dosages):    Preoxygenation:  Nasal cannula   Sedation:  Propofol   Intended level of sedation: deep   Intra-procedure monitoring:  Blood pressure monitoring, cardiac monitor, frequent LOC assessments, frequent vital sign checks, continuous capnometry and continuous pulse oximetry   Intra-procedure events: none      Total Provider sedation time (minutes):  10 Post-procedure details:    Attendance: Constant attendance by certified staff until patient recovered     Recovery: Patient returned to pre-procedure baseline     Post-sedation assessments completed and reviewed: airway patency, cardiovascular function, hydration status, mental status, nausea/vomiting, pain level, respiratory function and temperature     Patient is stable for discharge or admission: yes     Procedure completion:  Tolerated well, no immediate complications  ____________________________________________  INITIAL IMPRESSION / MDM / ASSESSMENT AND PLAN / ED COURSE  As part of my medical decision making, I reviewed the  following data within the electronic MEDICAL RECORD NUMBER Nursing notes reviewed and incorporated, Old chart reviewed, Notes from prior ED visits, and [place] Controlled Substance Database       *Alex [name] was evaluated in Emergency Department on 05/01/2021 for the symptoms described in the history of present illness. He was evaluated in the context of the global COVID-19 pandemic, which necessitated consideration that the patient might be at risk for infection with the SARS-CoV-2 virus that causes COVID-19. Institutional protocols and algorithms that pertain to the evaluation of patients at risk for COVID-19 are in a state of rapid change based on information released by regulatory bodies including the CDC and federal and state organizations. These policies and algorithms were followed during the patient's care in the ED.  Some ED evaluations and interventions may be delayed as a result of limited staffing during the pandemic.*     Medical Decision Making:  45 yo M with h/o polysubstance abuse here with left shoulder pain. Imaging shows L shoulder dislocation w/o fx. After informed consent, pt sedated, reduced. Plain films show appropriate reduction. Pain improved. Placed in shoulder immobilizer. Otherwise, no apparent  additional injuries. CBC, BMP unremarkable. CXR clear. Had some CP which I suspect is related to his shoulder- ekg nonischemic an trop neg. Will d/c with outpt ortho referral.  ____________________________________________  FINAL CLINICAL IMPRESSION(S) / ED DIAGNOSES  Final diagnoses:  Dislocation of left shoulder joint, initial encounter     MEDICATIONS GIVEN DURING THIS VISIT:  Medications  propofol (DIPRIVAN) 10 mg/mL bolus/IV push 77.1 mg (60 mg Intravenous Given 05/01/21 0142)  sodium chloride 0.9 % bolus 1,000 mL (0 mLs Intravenous Stopped 05/01/21 0257)  HYDROcodone-acetaminophen (NORCO/VICODIN) 5-325 MG per tablet 1 tablet (1 tablet Oral Given 05/01/21 0277)     ED Discharge Orders     None        Note:  This document was prepared using Dragon voice recognition software and may include unintentional dictation errors.   Shaune Pollack, MD 05/01/21 [phone]

## 2021-05-01 NOTE — ED Notes (Signed)
Consent for procedure signed by patient

## 2021-05-01 NOTE — ED Notes (Signed)
End tidal CO2 nasal cannula applied

## 2021-07-02 ENCOUNTER — Emergency Department
Admission: EM | Admit: 2021-07-02 | Discharge: 2021-07-03 | Disposition: A | Discharge status: Home / Self Care | Payer: Medicaid Other | Attending: Emergency Medicine | Admitting: Emergency Medicine

## 2021-07-02 ENCOUNTER — Other Ambulatory Visit: Payer: Self-pay

## 2021-07-02 DIAGNOSIS — R451 Restlessness and agitation: Secondary | ICD-10-CM | POA: Insufficient documentation

## 2021-07-02 DIAGNOSIS — F10929 Alcohol use, unspecified with intoxication, unspecified: Secondary | ICD-10-CM | POA: Insufficient documentation

## 2021-07-02 DIAGNOSIS — Z046 Encounter for general psychiatric examination, requested by authority: Secondary | ICD-10-CM | POA: Insufficient documentation

## 2021-07-02 DIAGNOSIS — Z91010 Allergy to peanuts: Secondary | ICD-10-CM | POA: Insufficient documentation

## 2021-07-02 DIAGNOSIS — F17210 Nicotine dependence, cigarettes, uncomplicated: Secondary | ICD-10-CM | POA: Insufficient documentation

## 2021-07-02 MED ORDER — ZIPRASIDONE MESYLATE 20 MG IM SOLR: 20.0000 mg | Freq: Once | INTRAMUSCULAR | Status: DC

## 2021-07-02 MED ORDER — DIPHENHYDRAMINE HCL 50 MG/ML IJ SOLN
50.0000 mg | Freq: Once | INTRAMUSCULAR | Status: AC
  Administered 2021-07-02: 50 mg via INTRAMUSCULAR
  Filled 2021-07-02: qty 1

## 2021-07-02 MED ORDER — ONDANSETRON HCL 4 MG PO TABS: 4.0000 mg | ORAL_TABLET | Freq: Three times a day (TID) | ORAL | Status: DC | PRN

## 2021-07-02 MED ORDER — LORAZEPAM 2 MG/ML IJ SOLN
2.0000 mg | Freq: Once | INTRAMUSCULAR | Status: AC
  Administered 2021-07-02: 2 mg via INTRAMUSCULAR
  Filled 2021-07-02: qty 1

## 2021-07-02 MED ORDER — HALOPERIDOL LACTATE 5 MG/ML IJ SOLN
5.0000 mg | Freq: Once | INTRAMUSCULAR | Status: AC
  Administered 2021-07-02: 5 mg via INTRAMUSCULAR
  Filled 2021-07-02: qty 1

## 2021-07-02 MED ORDER — ALUM & MAG HYDROXIDE-SIMETH 200-200-20 MG/5ML PO SUSP: 30.0000 mL | Freq: Four times a day (QID) | ORAL | Status: DC | PRN
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
## 2021-07-02 NOTE — ED Notes (Signed)
Pt arrived VOL with [place] PD, per officer, pt has been drinking, reports that the patient told mom he wanted to kill himself, mother reported to PD that the pt has not been taking his meds. Pt biligerent in triage, taken to room 20 for assessment.  Pt refusing to wear mask and refusing to be touched by staff.
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
## 2021-07-03 NOTE — BH Assessment (Signed)
Attempted to assess patient with Psyc NP but patient is currently unable to be aroused due to medications administered.

## 2021-07-03 NOTE — Discharge Instructions (Signed)
Please be careful not to drink so much in the future.  You could get in a lot of trouble that way.  If you have trouble not being able to stop drinking you could try alcoholics anonymous.  There are meetings in town every day.  Please return for any problems.

## 2021-07-03 NOTE — ED Provider Notes (Signed)
Emergency Medicine Observation Re-evaluation Note  Alex [name] is a 45 y.o. male, seen on rounds today.  Pt initially presented to the ED for complaints of Alcohol Intoxication Currently, the patient is sleeping.  Physical Exam  Ht 5\' 7"  (1.702 m)   Wt 77 kg   BMI 26.59 kg/m  Physical Exam Gen: No acute distress  Resp: Normal rise and fall of chest Neuro: Moving all four extremities Psych: Resting currently, agitated when awake but able to be re-directed verbally  ED Course / MDM  EKG:   I have reviewed the labs performed to d intermittently agitated when awake but able to be redirected verballyate as well as medications administered while in observation.  Recent changes in the last 24 hours include agitation overnight requiring sedation.  Plan  Current plan is for psychiatric evaluation for further disposition. [place] [facility] is under involuntary commitment.      Iyad Deroo, [name], DO 07/03/21 [phone]

## 2021-07-03 NOTE — ED Notes (Signed)
MD at bedside. 

## 2021-07-03 NOTE — Progress Notes (Signed)
Psychiatry brief note: TTS counselor Ms. Handy and I came to see the patient in the ED, but he was not arousable despite shaking him gently by the shoulder and saying his name loudly several times. On arrival at the ED, the patient's behavior led him to be medicated. Currently, calm, stable vitals and appropriate treatment are in place. We cannot do the psychiatric assessment at this time and will reassess once he awakens.

## 2021-07-03 NOTE — ED Notes (Addendum)
Pt given his belongings and will be transported home by [employer]. Pt signed physical discharge form and was accompanied to lobby. First RN made aware.

## 2021-07-03 NOTE — ED Provider Notes (Signed)
Patient awake and alert.  He does not appear to be clinically intoxicated at this time.  He is asking to go home.  He denies any homicidal or suicidal ideation.  He is not belligerent at all.  I will reverse his commitment at this time and allow him to go home.  He says he will need a ride as his mother does not have a car.   Arnaldo Natal, MD 07/03/21 [phone]

## 2021-07-03 NOTE — ED Notes (Signed)
Pt refusing vitals and bloodwork. EDP in room talking with pt at this time.

## 2021-07-03 NOTE — ED Notes (Signed)
Spoke to pt mother @ [facility] [phone].  She verbalized understanding of where and why her son was seen.  Guardian gave verbal permission to discharge pt home via cab.

## 2021-07-03 NOTE — ED Notes (Signed)
Patient changed into blue paper scrubs.  Belongings placed in belongings bag and locked in patient belongings area with label with patients info.   1-pair sweat type pants 1- blue tshirt 1 pair black shoes  1 black smart phone

## 2021-10-12 ENCOUNTER — Ambulatory Visit: Payer: Self-pay | Admitting: *Deleted

## 2021-10-12 NOTE — Telephone Encounter (Signed)
° °  Chief Complaint: "Knot left side of face, size of nickel" Symptoms: Tender to touch, "These things coma and go" Frequency: 2 months ago Pertinent Negatives: Patient denies fever, open lesions,drainage,itching Disposition: [] ED /[] Urgent Care (no appt availability in office) / [x] Appointment(In office/virtual)/ []  [facility]/ [] Home Care/ [] Refused Recommended Disposition /[]  Mobile Bus/ []  Follow-up with PCP Additional Notes: Advised to alert his PCP. Home care advise provided.    Reason for Disposition  Looks like a boil or infected sore  Answer Assessment - Initial Assessment Questions 1. ONSET: "When did the swelling start?" (e.g., minutes, hours, days)     2 months ago 2. LOCATION: "What part of the face is swollen?"     Left side of face, near mouth 3. SEVERITY: "How swollen is it?"     Size of nickel 4. ITCHING: "Is there any itching?" If Yes, ask: "How much?"   (Scale 1-10; mild, moderate or severe)     No 5. PAIN: "Is the swelling painful to touch?" If Yes, ask: "How painful is it?"   (Scale 1-10; mild, moderate or severe)   - NONE (0): no pain   - MILD (1-3): doesn't interfere with normal activities    - MODERATE (4-7): interferes with normal activities or awakens from sleep    - SEVERE (8-10): excruciating pain, unable to do any normal activities      10/10 6. FEVER: "Do you have a fever?" If Yes, ask: "What is it, how was it measured, and when did it start?"      no 7. CAUSE: "What do you think is causing the face swelling?"     Unsure 8. RECURRENT SYMPTOM: "Have you had face swelling before?" If Yes, ask: "When was the last time?" "What happened that time?"     "These things come and go." 9. OTHER SYMPTOMS: "Do you have any other symptoms?" (e.g., toothache, leg swelling)     no  Protocols used: Face Swelling-A-AH

## 2021-12-22 ENCOUNTER — Ambulatory Visit: Payer: Self-pay

## 2021-12-22 NOTE — Telephone Encounter (Signed)
?  Chief Complaint: Shoulder pain - chest pain ?Symptoms:  ?Frequency:  ?Pertinent Negatives: Patient denies  ?Disposition: [] ED /[] Urgent Care (no appt availability in office) / [] Appointment(In office/virtual)/ []  [facility]/ [] Home Care/ [] Refused Recommended Disposition /[] [facility]/ []  Follow-up with PCP ?Additional Notes: Pt terminated call when I could not understand him. He said that everything about the shoulder injury was already in the chart.He stated that I should not worry about it. He would call his primary care doctor. ? ?

## 2022-09-20 ENCOUNTER — Emergency Department
Admission: EM | Admit: 2022-09-20 | Discharge: 2022-09-21 | Disposition: A | Discharge status: Home / Self Care | Payer: Medicaid Other | Attending: Emergency Medicine | Admitting: Emergency Medicine

## 2022-09-20 DIAGNOSIS — F1420 Cocaine dependence, uncomplicated: Secondary | ICD-10-CM | POA: Diagnosis present

## 2022-09-20 DIAGNOSIS — F10939 Alcohol use, unspecified with withdrawal, unspecified: Secondary | ICD-10-CM | POA: Diagnosis present

## 2022-09-20 DIAGNOSIS — R45851 Suicidal ideations: Secondary | ICD-10-CM | POA: Insufficient documentation

## 2022-09-20 DIAGNOSIS — F1029 Alcohol dependence with unspecified alcohol-induced disorder: Secondary | ICD-10-CM | POA: Diagnosis present

## 2022-09-20 DIAGNOSIS — F909 Attention-deficit hyperactivity disorder, unspecified type: Secondary | ICD-10-CM | POA: Diagnosis present

## 2022-09-20 DIAGNOSIS — F1490 Cocaine use, unspecified, uncomplicated: Secondary | ICD-10-CM

## 2022-09-20 DIAGNOSIS — F1994 Other psychoactive substance use, unspecified with psychoactive substance-induced mood disorder: Secondary | ICD-10-CM | POA: Diagnosis present

## 2022-09-20 DIAGNOSIS — F10920 Alcohol use, unspecified with intoxication, uncomplicated: Secondary | ICD-10-CM

## 2022-09-20 DIAGNOSIS — F10239 Alcohol dependence with withdrawal, unspecified: Secondary | ICD-10-CM | POA: Insufficient documentation

## 2022-09-20 DIAGNOSIS — F331 Major depressive disorder, recurrent, moderate: Secondary | ICD-10-CM | POA: Diagnosis present

## 2022-09-20 DIAGNOSIS — Z91010 Allergy to peanuts: Secondary | ICD-10-CM | POA: Insufficient documentation

## 2022-09-20 DIAGNOSIS — F1410 Cocaine abuse, uncomplicated: Secondary | ICD-10-CM | POA: Diagnosis present

## 2022-09-20 DIAGNOSIS — Z1152 Encounter for screening for COVID-19: Secondary | ICD-10-CM | POA: Insufficient documentation

## 2022-09-20 DIAGNOSIS — Y906 Blood alcohol level of 120-199 mg/100 ml: Secondary | ICD-10-CM | POA: Insufficient documentation

## 2022-09-20 DIAGNOSIS — F17200 Nicotine dependence, unspecified, uncomplicated: Secondary | ICD-10-CM | POA: Diagnosis present

## 2022-09-20 DIAGNOSIS — F1290 Cannabis use, unspecified, uncomplicated: Secondary | ICD-10-CM | POA: Insufficient documentation

## 2022-09-20 DIAGNOSIS — F32A Depression, unspecified: Secondary | ICD-10-CM | POA: Diagnosis present

## 2022-09-20 DIAGNOSIS — F17210 Nicotine dependence, cigarettes, uncomplicated: Secondary | ICD-10-CM | POA: Insufficient documentation

## 2022-09-20 NOTE — ED Provider Triage Note (Signed)
Emergency Medicine Provider Triage Evaluation Note  Alex [name] , a 46 y.o. male  was evaluated in triage.  Pt complains of voluntary commitment. Wants to hurt someone else. Says that he wants to be left alone and when he isnt left alone he wants to hurt someone else.  Review of Systems  Positive: Homicidal ideations Negative: Suicidal ideation, other symptoms  Physical Exam  There were no vitals taken for this visit. Gen:   Awake, no distress   Resp:  Normal effort  MSK:   Moves extremities without difficulty  Other:    Medical Decision Making  Medically screening exam initiated at 10:57 PM.  Appropriate orders placed.  Alex [name] was informed that the remainder of the evaluation will be completed by another provider, this initial triage assessment does not replace that evaluation, and the importance of remaining in the ED until their evaluation is complete.  Labs   Racheal Patches, [place] 09/20/22 2257

## 2022-09-21 ENCOUNTER — Other Ambulatory Visit: Payer: Self-pay

## 2022-09-21 ENCOUNTER — Encounter: Payer: Self-pay | Admitting: Emergency Medicine

## 2022-09-21 DIAGNOSIS — F1994 Other psychoactive substance use, unspecified with psychoactive substance-induced mood disorder: Secondary | ICD-10-CM

## 2022-09-21 LAB — URINE DRUG SCREEN, QUALITATIVE (ARMC ONLY)
Amphetamines, Ur Screen: NOT DETECTED
Barbiturates, Ur Screen: NOT DETECTED
Benzodiazepine, Ur Scrn: NOT DETECTED
Cannabinoid 50 Ng, Ur ~~LOC~~: POSITIVE — AB
Cocaine Metabolite,Ur ~~LOC~~: POSITIVE — AB
MDMA (Ecstasy)Ur Screen: NOT DETECTED
Methadone Scn, Ur: NOT DETECTED
Opiate, Ur Screen: NOT DETECTED
Phencyclidine (PCP) Ur S: NOT DETECTED
Tricyclic, Ur Screen: NOT DETECTED

## 2022-09-21 LAB — COMPREHENSIVE METABOLIC PANEL
ALT: 11 U/L (ref 0–44)
AST: 17 U/L (ref 15–41)
Albumin: 3.8 g/dL (ref 3.5–5.0)
Alkaline Phosphatase: 63 U/L (ref 38–126)
Anion gap: 8 (ref 5–15)
BUN: 10 mg/dL (ref 6–20)
CO2: 24 mmol/L (ref 22–32)
Calcium: 8.8 mg/dL — ABNORMAL LOW (ref 8.9–10.3)
Chloride: 109 mmol/L (ref 98–111)
Creatinine, Ser: 0.73 mg/dL (ref 0.61–1.24)
GFR, Estimated: >60 mL/min (ref >60)
Glucose, Bld: 102 mg/dL — ABNORMAL HIGH (ref 70–99)
Potassium: 3.9 mmol/L (ref 3.5–5.1)
Sodium: 141 mmol/L (ref 135–145)
Total Bilirubin: 0.4 mg/dL (ref 0.3–1.2)
Total Protein: 7.5 g/dL (ref 6.5–8.1)

## 2022-09-21 LAB — CBC WITH DIFFERENTIAL/PLATELET
Abs Immature Granulocytes: 0.01 10*3/uL (ref 0.00–0.07)
Basophils Absolute: 0 10*3/uL (ref 0.0–0.1)
Basophils Relative: 1 %
Eosinophils Absolute: 0.4 10*3/uL (ref 0.0–0.5)
Eosinophils Relative: 6 %
HCT: 39.8 % (ref 39.0–52.0)
Hemoglobin: 12.1 g/dL — ABNORMAL LOW (ref 13.0–17.0)
Immature Granulocytes: 0 %
Lymphocytes Relative: 35 %
Lymphs Abs: 2.2 10*3/uL (ref 0.7–4.0)
MCH: 27.6 pg (ref 26.0–34.0)
MCHC: 30.4 g/dL (ref 30.0–36.0)
MCV: 90.7 fL (ref 80.0–100.0)
Monocytes Absolute: 0.7 10*3/uL (ref 0.1–1.0)
Monocytes Relative: 11 %
Neutro Abs: 3 10*3/uL (ref 1.7–7.7)
Neutrophils Relative %: 47 %
Platelets: 221 10*3/uL (ref 150–400)
RBC: 4.39 MIL/uL (ref 4.22–5.81)
RDW: 15.1 % (ref 11.5–15.5)
WBC: 6.3 10*3/uL (ref 4.0–10.5)
nRBC: 0 % (ref 0.0–0.2)

## 2022-09-21 LAB — RESP PANEL BY RT-PCR (RSV, FLU A&B, COVID)  RVPGX2
Influenza A by PCR: NEGATIVE
Influenza B by PCR: NEGATIVE
Resp Syncytial Virus by PCR: NEGATIVE
SARS Coronavirus 2 by RT PCR: NEGATIVE

## 2022-09-21 LAB — URINALYSIS, ROUTINE W REFLEX MICROSCOPIC
Bilirubin Urine: NEGATIVE
Glucose, UA: NEGATIVE mg/dL
Hgb urine dipstick: NEGATIVE
Ketones, ur: NEGATIVE mg/dL
Leukocytes,Ua: NEGATIVE
Nitrite: NEGATIVE
Protein, ur: NEGATIVE mg/dL
Specific Gravity, Urine: 1.002 — ABNORMAL LOW (ref 1.005–1.030)
pH: 6 (ref 5.0–8.0)

## 2022-09-21 LAB — ACETAMINOPHEN LEVEL: Acetaminophen (Tylenol), Serum: <10 ug/mL — ABNORMAL LOW (ref 10–30)

## 2022-09-21 LAB — SALICYLATE LEVEL: Salicylate Lvl: <7 mg/dL — ABNORMAL LOW (ref 7.0–30.0)

## 2022-09-21 LAB — ETHANOL: Alcohol, Ethyl (B): 180 mg/dL — ABNORMAL HIGH (ref <10)

## 2022-09-21 MED ORDER — LORAZEPAM 1 MG PO TABS
1.0000 mg | ORAL_TABLET | Freq: Once | ORAL | Status: AC
  Administered 2022-09-21: 1 mg via ORAL
  Filled 2022-09-21: qty 1

## 2022-09-21 MED ORDER — LORAZEPAM 2 MG PO TABS: 0.0000 mg | ORAL_TABLET | Freq: Four times a day (QID) | ORAL | Status: DC

## 2022-09-21 MED ORDER — THIAMINE HCL 100 MG PO TABS
100.0000 mg | ORAL_TABLET | Freq: Every day | ORAL | Status: DC
  Administered 2022-09-21: 100 mg via ORAL
  Filled 2022-09-21 (×2): qty 1

## 2022-09-21 NOTE — Discharge Instructions (Addendum)
You have been seen in the emergency department for a  psychiatric concern. You have been evaluated both medically as well as psychiatrically. Please follow-up with your outpatient resources provided. Return to the emergency department for any worsening symptoms, or any thoughts of hurting yourself or anyone else so that we may attempt to help you. 

## 2022-09-21 NOTE — ED Notes (Signed)
All belongings returned to patient

## 2022-09-21 NOTE — ED Notes (Signed)
Spoke with mother Stark Klein who is legal guardian reports he needs help. Advised he has been cleared by psych and we can't keep him here and will be sending him back via cab to her house.  Dr Deberah Castle spoke with mother as well.

## 2022-09-21 NOTE — ED Notes (Signed)
VOL/pending re assessement

## 2022-09-21 NOTE — ED Provider Notes (Signed)
[facility] Provider Note    Event Date/Time   First MD Initiated Contact with Patient 09/20/22 2317     (approximate)   History   Suicidal and Homicidal   HPI  Alex [name] is a 46 y.o. male who presents to the ED from home feeling suicidal and homicidal.  Patient states he has been under multiple stressors including a recent death of a friend.  Endorses alcohol, cocaine and marijuana use.  Denies AH/VH.  Voices no medical complaints.     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 Authorizing Provider  citalopram (CELEXA) 40 MG tablet Take 40 mg by mouth daily. 02/27/18   [provider]  Multiple Vitamin (MULTIVITAMIN) tablet Take 1 tablet by mouth daily.    [provider]  omeprazole (PRILOSEC) 20 MG capsule Take 20 mg by mouth 2 (two) times daily before a meal.    [provider]  TRAZODONE HCL PO Take by mouth.    [provider]  UNKNOWN TO PATIENT Htn med    [provider]  valproic acid (DEPAKENE) 250 MG capsule Take 3 capsules (750 mg total) by mouth at bedtime. 07/11/18 07/11/19  Loleta Rose, MD      Allergies  Peanut butter flavor   Family History  History reviewed. No pertinent family history.   Physical Exam  Triage Vital Signs: ED Triage Vitals  Enc Vitals Group     BP 09/20/22 2340 124/82     Pulse Rate 09/20/22 2340 73     Resp 09/20/22 2340 16     Temp 09/20/22 2340 98 F (36.7 C)     Temp Source 09/20/22 2340 Oral     SpO2 09/20/22 2340 100 %     Weight 09/21/22 0020 220 lb (99.8 kg)     Height 09/21/22 0020 5\' 7"  (1.702 m)     Head Circumference --      Peak Flow --      Pain Score 09/21/22 0019 0     Pain Loc --      Pain Edu? --      Excl. in GC? --     Updated Vital Signs: BP 124/82 (BP Location: Left Arm)   Pulse 73   Temp 98 F (36.7 C) (Oral)   Resp 16   Ht 5\' 7"  (1.702 m)   Wt 99.8 kg   SpO2 100%   BMI 34.46 kg/m    General: Awake, no distress.  CV:  RRR.  Good peripheral perfusion.  Resp:  Normal effort.  CTAB. Abd:  Nontender.  No distention.  Other:  Tearful   ED Results / Procedures / Treatments  Labs (all labs ordered are listed, but only abnormal results are displayed) Labs Reviewed  URINALYSIS, ROUTINE W REFLEX MICROSCOPIC - Abnormal; Notable for the following components:      Result Value   Color, Urine COLORLESS (*)    APPearance CLEAR (*)    Specific Gravity, Urine 1.002 (*)    All other components within normal limits  URINE DRUG SCREEN, QUALITATIVE ([facility] ONLY) - Abnormal; Notable for the following components:   Cocaine Metabolite,Ur [place] POSITIVE (*)    Cannabinoid 50 Ng, Ur [place] POSITIVE (*)    All other components within normal limits  COMPREHENSIVE METABOLIC PANEL - Abnormal; Notable for the following components:   Glucose, Bld 102 (*)    Calcium 8.8 (*)    All other components within normal limits  CBC WITH DIFFERENTIAL/PLATELET - Abnormal; Notable for the following components:   Hemoglobin 12.1 (*)    All other components within normal limits  SALICYLATE LEVEL - Abnormal; Notable for the following  components:   Salicylate Lvl <7.0 (*)    All other components within normal limits  ETHANOL - Abnormal; Notable for the following components:   Alcohol, Ethyl (B) 180 (*)    All other components within normal limits  ACETAMINOPHEN LEVEL - Abnormal; Notable for the following components:   Acetaminophen (Tylenol), Serum <10 (*)    All other components within normal limits  RESP PANEL BY RT-PCR (RSV, FLU A&B, COVID)  [id number]     EKG  None   RADIOLOGY None   Official radiology report(s): No results found.   PROCEDURES:  Critical Care performed: No  Procedures   MEDICATIONS ORDERED IN ED: Medications  LORazepam (ATIVAN) tablet 0-4 mg ( Oral Not Given 09/21/22 0035)  thiamine (VITAMIN B1) tablet 100 mg (has no administration in time range)  LORazepam (ATIVAN) tablet 1 mg (1 mg Oral Given 09/21/22 0033)     IMPRESSION / MDM / ASSESSMENT AND PLAN / ED COURSE  I reviewed the triage vital signs and the nursing notes.                             46 year old male presenting with depression with SI and HI.  Contracts for safety while in the emergency department. The patient has been placed in psychiatric observation due to the need to provide a safe environment for the patient while obtaining psychiatric consultation and evaluation, as well as ongoing medical and medication management to treat the patient's condition.  The patient has not been placed under full IVC at this time.   Patient's presentation is most consistent with exacerbation of chronic illness.   FINAL CLINICAL IMPRESSION(S) / ED DIAGNOSES   Final diagnoses:  Alcoholic intoxication without complication (HCC)  Cocaine use  Marijuana use  Substance induced mood disorder (HCC)     Rx / DC Orders   ED Discharge Orders     None        Note:  This document was prepared using Dragon voice recognition software and may include unintentional dictation errors.   Irean Hong, MD 09/21/22 [phone]

## 2022-09-21 NOTE — Consult Note (Signed)
[facility] Face-to-Face Psychiatry Consult   Reason for Consult: Psychiatric Evaluation Referring Physician: Dr. Dolores Frame Patient Identification: Alex [name] MRN:  [id number] Principal Diagnosis: <principal problem not specified> Diagnosis:  Active Problems:   Alcohol dependence with unspecified alcohol-induced disorder (HCC)   Alcohol withdrawal (HCC)   Cocaine use disorder, moderate, dependence (HCC)   Unspecified Depressive disorder   Tobacco use disorder   ADHD   Cocaine abuse (HCC)   Substance induced mood disorder (HCC)   Moderate recurrent major depression (HCC)   Total Time spent with patient: 1 hour  Subjective: "I get sad sometimes." Alex [name] is a 46 y.o. male patient presented to [facility] ED with complaints of feeling suicidal and homicidal and here voluntarily. The patient was seen resting in bed with difficulties arousing him. The patient UDS and BAL are remarkable. The patient's BAL is 180 mg/dl, and UDS has cocaine and Cannabinoids.  This provider saw The patient face-to-face; the chart was reviewed, and Dr. Dolores Frame was consulted on 09/21/2022 due to the patient's care. It was discussed with the EDP that the patient will remain under observation overnight and reassessed in the morning to determine if he is psychiatrically safe to be discharged.  On evaluation, the patient is alert and oriented x 2, calm, cooperative, and mood-congruent with affect. The patient does not appear to be responding to internal or external stimuli. Neither is the patient presenting with any delusional thinking. The patient denies auditory or visual hallucinations. The patient admits to passive suicidal ideations but denies homicidal ideation. The patient is not presenting with any psychotic or paranoid behaviors. During an encounter with the patient, he cannot answer questions appropriately.  HPI:    Past Psychiatric History:  Alcohol abuse Anxiety Cocaine abuse (HCC) Depression Seizures (HCC)    Risk to Self:   Risk to Others:   Prior Inpatient Therapy:   Prior Outpatient Therapy:    Past Medical History:  Past Medical History:  Diagnosis Date   Alcohol abuse    Anxiety    Cocaine abuse (HCC) 05/27/2016   Depression    Seizures (HCC)     Past Surgical History:  Procedure Laterality Date   FINGER SURGERY     Family History: History reviewed. No pertinent family history. Family Psychiatric  History: History reviewed. No pertinent family psychiatric history Social History:  Social History   Substance and Sexual Activity  Alcohol Use Yes   Alcohol/week: 4.0 - 5.0 standard drinks of alcohol   Types: 4 - 5 Cans of beer per week   Comment: Pt states that last drink was 3-4 days ago     Social History   Substance and Sexual Activity  Drug Use Yes   Types: Marijuana, Cocaine   Comment: cocaine last used on friday    Social History   Socioeconomic History   Marital status: Single    Spouse name: Not on file   Number of children: Not on file   Years of education: Not on file   Highest education level: Not on file  Occupational History   Not on file  Tobacco Use   Smoking status: Every Day    Packs/day: 1.00    Types: Cigarettes   Smokeless tobacco: Never  Substance and Sexual Activity   Alcohol use: Yes    Alcohol/week: 4.0 - 5.0 standard drinks of alcohol    Types: 4 - 5 Cans of beer per week    Comment: Pt states that last drink was 3-4 days ago  Drug use: Yes    Types: Marijuana, Cocaine    Comment: cocaine last used on friday   Sexual activity: Not on file  Other Topics Concern   Not on file  Social History Narrative   Not on file   Social Determinants of Health   Financial Resource Strain: Not on file  Food Insecurity: Not on file  Transportation Needs: Not on file  Physical Activity: Not on file  Stress: Not on file  Social Connections: Not on file   Additional Social History:    Allergies:   Allergies  Allergen Reactions   Peanut  Butter Flavor     Labs:  Results for orders placed or performed during the hospital encounter of 09/20/22 (from the past 48 hour(s))  Comprehensive metabolic panel     Status: Abnormal   Collection Time: 09/20/22 11:59 PM  Result Value Ref Range   Sodium 141 135 - 145 mmol/L   Potassium 3.9 3.5 - 5.1 mmol/L   Chloride 109 98 - 111 mmol/L   CO2 24 22 - 32 mmol/L   Glucose, Bld 102 (H) 70 - 99 mg/dL    Comment: Glucose reference range applies only to samples taken after fasting for at least 8 hours.   BUN 10 6 - 20 mg/dL   Creatinine, Ser 1.61 0.61 - 1.24 mg/dL   Calcium 8.8 (L) 8.9 - 10.3 mg/dL   Total Protein 7.5 6.5 - 8.1 g/dL   Albumin 3.8 3.5 - 5.0 g/dL   AST 17 15 - 41 U/L   ALT 11 0 - 44 U/L   Alkaline Phosphatase 63 38 - 126 U/L   Total Bilirubin 0.4 0.3 - 1.2 mg/dL   GFR, Estimated >09 >60 mL/min    Comment: (NOTE) Calculated using the CKD-EPI Creatinine Equation (2021)    Anion gap 8 5 - 15    Comment: Performed at [facility], [address] [place], [place] [address]  CBC with Differential     Status: Abnormal   Collection Time: 09/20/22 11:59 PM  Result Value Ref Range   WBC 6.3 4.0 - 10.5 K/uL   RBC 4.39 4.22 - 5.81 MIL/uL   Hemoglobin 12.1 (L) 13.0 - 17.0 g/dL   HCT 81.1 91.4 - 78.2 %   MCV 90.7 80.0 - 100.0 fL   MCH 27.6 26.0 - 34.0 pg   MCHC 30.4 30.0 - 36.0 g/dL   RDW 95.6 21.3 - 08.6 %   Platelets 221 150 - 400 K/uL   nRBC 0.0 0.0 - 0.2 %   Neutrophils Relative % 47 %   Neutro Abs 3.0 1.7 - 7.7 K/uL   Lymphocytes Relative 35 %   Lymphs Abs 2.2 0.7 - 4.0 K/uL   Monocytes Relative 11 %   Monocytes Absolute 0.7 0.1 - 1.0 K/uL   Eosinophils Relative 6 %   Eosinophils Absolute 0.4 0.0 - 0.5 K/uL   Basophils Relative 1 %   Basophils Absolute 0.0 0.0 - 0.1 K/uL   Immature Granulocytes 0 %   Abs Immature Granulocytes 0.01 0.00 - 0.07 K/uL    Comment: Performed at [facility], [address] [place], [place] [address]  Salicylate  level     Status: Abnormal   Collection Time: 09/20/22 11:59 PM  Result Value Ref Range   Salicylate Lvl <7.0 (L) 7.0 - 30.0 mg/dL    Comment: Performed at [facility], [address] [place], [place] [address]  Ethanol     Status:  Abnormal   Collection Time: 09/20/22 11:59 PM  Result Value Ref Range   Alcohol, Ethyl (B) 180 (H) <10 mg/dL    Comment: (NOTE) Lowest detectable limit for serum alcohol is 10 mg/dL.  For medical purposes only. Performed at [facility], [address] [place], [place] [address]   Acetaminophen level     Status: Abnormal   Collection Time: 09/20/22 11:59 PM  Result Value Ref Range   Acetaminophen (Tylenol), Serum <10 (L) 10 - 30 ug/mL    Comment: (NOTE) Therapeutic concentrations vary significantly. A range of 10-30 ug/mL  may be an effective concentration for many patients. However, some  are best treated at concentrations outside of this range. Acetaminophen concentrations >150 ug/mL at 4 hours after ingestion  and >50 ug/mL at 12 hours after ingestion are often associated with  toxic reactions.  Performed at [facility], [address]  [place], [place] [address]   Resp panel by RT-PCR (RSV, Flu A&B, Covid) Anterior Nasal Swab     Status: None   Collection Time: 09/21/22 12:29 AM   Specimen: Anterior Nasal Swab  Result Value Ref Range   SARS Coronavirus 2 by RT PCR NEGATIVE NEGATIVE    Comment: (NOTE) SARS-CoV-2 target nucleic acids are NOT DETECTED.  The SARS-CoV-2 RNA is generally detectable in upper respiratory specimens during the acute phase of infection. The lowest concentration of SARS-CoV-2 viral copies this assay can detect is 138 copies/mL. A negative result does not preclude SARS-Cov-2 infection and should not be used as the sole basis for treatment or other patient management decisions. A negative result may occur with  improper specimen collection/handling, submission of specimen other than  nasopharyngeal swab, presence of viral mutation(s) within the areas targeted by this assay, and inadequate number of viral copies(<138 copies/mL). A negative result must be combined with clinical observations, patient history, and epidemiological information. The expected result is Negative.  Fact Sheet for Patients:  BloggerCourse.com  Fact Sheet for Healthcare Providers:  SeriousBroker.it  This test is no t yet approved or cleared by the [place] FDA and  has been authorized for detection and/or diagnosis of SARS-CoV-2 by FDA under an Emergency Use Authorization (EUA). This EUA will remain  in effect (meaning this test can be used) for the duration of the COVID-19 declaration under Section 564(b)(1) of the Act, 21 U.S.C.section 360bbb-3(b)(1), unless the authorization is terminated  or revoked sooner.       Influenza A by PCR NEGATIVE NEGATIVE   Influenza B by PCR NEGATIVE NEGATIVE    Comment: (NOTE) The Xpert Xpress SARS-CoV-2/FLU/RSV plus assay is intended as an aid in the diagnosis of influenza from Nasopharyngeal swab specimens and should not be used as a sole basis for treatment. Nasal washings and aspirates are unacceptable for Xpert Xpress SARS-CoV-2/FLU/RSV testing.  Fact Sheet for Patients: BloggerCourse.com  Fact Sheet for Healthcare Providers: SeriousBroker.it  This test is not yet approved or cleared by the [place] FDA and has been authorized for detection and/or diagnosis of SARS-CoV-2 by FDA under an Emergency Use Authorization (EUA). This EUA will remain in effect (meaning this test can be used) for the duration of the COVID-19 declaration under Section 564(b)(1) of the Act, 21 U.S.C. section 360bbb-3(b)(1), unless the authorization is terminated or revoked.     Resp Syncytial Virus by PCR NEGATIVE NEGATIVE    Comment: (NOTE) Fact Sheet for  Patients: BloggerCourse.com  Fact Sheet for Healthcare Providers: SeriousBroker.it  This test is not yet approved or  cleared by the [place] and has been authorized for detection and/or diagnosis of SARS-CoV-2 by FDA under an Emergency Use Authorization (EUA). This EUA will remain in effect (meaning this test can be used) for the duration of the COVID-19 declaration under Section 564(b)(1) of the Act, 21 U.S.C. section 360bbb-3(b)(1), unless the authorization is terminated or revoked.  Performed at [facility], [address] [place], [place] [address]   Urinalysis, Routine w reflex microscopic     Status: Abnormal   Collection Time: 09/21/22  1:00 AM  Result Value Ref Range   Color, Urine COLORLESS (A) YELLOW   APPearance CLEAR (A) CLEAR   Specific Gravity, Urine 1.002 (L) 1.005 - 1.030   pH 6.0 5.0 - 8.0   Glucose, UA NEGATIVE NEGATIVE mg/dL   Hgb urine dipstick NEGATIVE NEGATIVE   Bilirubin Urine NEGATIVE NEGATIVE   Ketones, ur NEGATIVE NEGATIVE mg/dL   Protein, ur NEGATIVE NEGATIVE mg/dL   Nitrite NEGATIVE NEGATIVE   Leukocytes,Ua NEGATIVE NEGATIVE    Comment: Performed at [facility], [address] [place], [place] [address]  Urine Drug Screen, Qualitative     Status: Abnormal   Collection Time: 09/21/22  1:00 AM  Result Value Ref Range   Tricyclic, Ur Screen NONE DETECTED NONE DETECTED   Amphetamines, Ur Screen NONE DETECTED NONE DETECTED   MDMA (Ecstasy)Ur Screen NONE DETECTED NONE DETECTED   Cocaine Metabolite,Ur [place] POSITIVE (A) NONE DETECTED   Opiate, Ur Screen NONE DETECTED NONE DETECTED   Phencyclidine (PCP) Ur S NONE DETECTED NONE DETECTED   Cannabinoid 50 Ng, Ur [place] POSITIVE (A) NONE DETECTED   Barbiturates, Ur Screen NONE DETECTED NONE DETECTED   Benzodiazepine, Ur Scrn NONE DETECTED NONE DETECTED   Methadone Scn, Ur NONE DETECTED NONE DETECTED    Comment: (NOTE) Tricyclics +  metabolites, urine    Cutoff 1000 ng/mL Amphetamines + metabolites, urine  Cutoff 1000 ng/mL MDMA (Ecstasy), urine              Cutoff 500 ng/mL Cocaine Metabolite, urine          Cutoff 300 ng/mL Opiate + metabolites, urine        Cutoff 300 ng/mL Phencyclidine (PCP), urine         Cutoff 25 ng/mL Cannabinoid, urine                 Cutoff 50 ng/mL Barbiturates + metabolites, urine  Cutoff 200 ng/mL Benzodiazepine, urine              Cutoff 200 ng/mL Methadone, urine                   Cutoff 300 ng/mL  The urine drug screen provides only a preliminary, unconfirmed analytical test result and should not be used for non-medical purposes. Clinical consideration and professional judgment should be applied to any positive drug screen result due to possible interfering substances. A more specific alternate chemical method must be used in order to obtain a confirmed analytical result. Gas chromatography / mass spectrometry (GC/MS) is the preferred confirm atory method. Performed at [facility], [address] [place], [place] [address]     Current Facility-Administered Medications  Medication Dose Route Frequency Provider Last Rate Last Admin   LORazepam (ATIVAN) tablet 0-4 mg  0-4 mg Oral Q6H Irean Hong, MD       thiamine (VITAMIN B1) tablet 100 mg  100 mg Oral Daily Irean Hong, MD  Current Outpatient Medications  Medication Sig Dispense Refill   citalopram (CELEXA) 40 MG tablet Take 40 mg by mouth daily.  5   Multiple Vitamin (MULTIVITAMIN) tablet Take 1 tablet by mouth daily.     omeprazole (PRILOSEC) 20 MG capsule Take 20 mg by mouth 2 (two) times daily before a meal.     TRAZODONE HCL PO Take by mouth.     UNKNOWN TO PATIENT Htn med     valproic acid (DEPAKENE) 250 MG capsule Take 3 capsules (750 mg total) by mouth at bedtime. 90 capsule 2    Musculoskeletal: Strength & Muscle Tone: within normal limits Gait & Station: normal Patient leans: N/A Psychiatric  Specialty Exam:  Presentation  General Appearance:  Disheveled  Eye Contact: Minimal  Speech: Slurred; Slow  Speech Volume: Decreased  Handedness: Right   Mood and Affect  Mood: Euphoric  Affect: Blunt; Depressed; Inappropriate   Thought Process  Thought Processes: Disorganized  Descriptions of Associations:Loose  Orientation:Full (Time, Place and Person)  Thought Content:Illogical; Scattered  History of Schizophrenia/Schizoaffective disorder:No data recorded Duration of Psychotic Symptoms:No data recorded Hallucinations:Hallucinations: None  Ideas of Reference:None  Suicidal Thoughts:Suicidal Thoughts: Yes, Passive SI Passive Intent and/or Plan: Without Intent; Without Plan  Homicidal Thoughts:Homicidal Thoughts: No   Sensorium  Memory: Immediate Fair; Recent Fair; Remote Fair  Judgment: Poor  Insight: Poor   Executive Functions  Concentration: Fair  Attention Span: Fair  Recall: FiservFair  Fund of Knowledge: Fair  Language: Fair   Psychomotor Activity  Psychomotor Activity: Psychomotor Activity: Normal   Assets  Assets: Manufacturing systems engineerCommunication Skills; Desire for Improvement; Resilience; Social Support   Sleep  Sleep: Sleep: Good Number of Hours of Sleep: 8   Physical Exam: Physical Exam Vitals and nursing note reviewed.  Constitutional:      Appearance: Normal appearance. He is normal weight.  HENT:     Head: Normocephalic and atraumatic.     Right Ear: External ear normal.     Left Ear: External ear normal.     Nose: Nose normal.     Mouth/Throat:     Mouth: Mucous membranes are dry.  Cardiovascular:     Rate and Rhythm: Normal rate.     Pulses: Normal pulses.  Pulmonary:     Effort: Pulmonary effort is normal.  Musculoskeletal:        General: Normal range of motion.     Cervical back: Normal range of motion.  Neurological:     General: No focal deficit present.     Mental Status: He is alert and oriented to  person, place, and time.  Psychiatric:        Attention and Perception: Attention normal.        Mood and Affect: Mood is depressed. Affect is blunt and inappropriate.        Speech: Speech is delayed and slurred.        Behavior: Behavior is cooperative.        Thought Content: Thought content includes suicidal ideation.        Cognition and Memory: Cognition is impaired.        Judgment: Judgment is inappropriate.    Review of Systems  Psychiatric/Behavioral:  Positive for depression, substance abuse and suicidal ideas.   All other systems reviewed and are negative.  Blood pressure 124/82, pulse 73, temperature 98 F (36.7 C), temperature source Oral, resp. rate 16, height 5\' 7"  (1.702 m), weight 99.8 kg, SpO2 100 %. Body mass index is 34.46 kg/m.  Treatment Plan Summary: Daily contact with patient to assess and evaluate symptoms and progress in treatment and Plan   The patient will remain under observation overnight and reassessed in the morning to determine if he is psychiatrically safe to be discharged.  Disposition: Supportive therapy provided about ongoing stressors. The patient will remain under observation overnight and reassessed in the morning to determine if he is psychiatrically safe to be discharged.  Gillermo Murdoch, NP 09/21/2022 2:15 AM

## 2022-09-21 NOTE — Consult Note (Signed)
[facility] Psych ED Progress Note  09/21/2022 2:36 PM Alex [name]  MRN:  [id number]   Method of visit?: Face to Face   Subjective:  "I'm ready to go home. I was high, not suicidal."  UDS was positive for Cocaine and cannabinoids at 0100. BAL 180 at 2400 yesterday. Patient appears to have metabolized substances at this time and denies any thought or intent of suicide. Denies auditory or visual hallucinations and perceptions appear to be intact. Patient states that he has had some things going on at home with family and  He was upset. He states that he wants to be discharged and will follow up with outpatient provider. Writer also gave him contact information for [facility] for mental health and substance abuse services.  Patient denies suicidal or homicidal thoughts or intent. Denies auditory or visual hallucinations. Patient noted to have a cough. Writer made EDP aware.    Principal Problem: Substance induced mood disorder (HCC) Diagnosis:  Principal Problem:   Substance induced mood disorder (HCC) Active Problems:   Alcohol dependence with unspecified alcohol-induced disorder (HCC)   Alcohol withdrawal (HCC)   Cocaine use disorder, moderate, dependence (HCC)   Unspecified Depressive disorder   Tobacco use disorder   ADHD   Cocaine abuse (HCC)   Moderate recurrent major depression (HCC)  Total Time spent with patient: 20 minutes  Past Psychiatric History: Substance-induced mood disorder  Past Medical History:  Past Medical History:  Diagnosis Date   Alcohol abuse    Anxiety    Cocaine abuse (HCC) 05/27/2016   Depression    Seizures (HCC)     Past Surgical History:  Procedure Laterality Date   FINGER SURGERY     Family History: History reviewed. No pertinent family history. Family Psychiatric  History:  Social History:  Social History   Substance and Sexual Activity  Alcohol Use Yes   Alcohol/week: 4.0 - 5.0 standard drinks of alcohol   Types: 4 - 5 Cans of beer per week    Comment: Pt states that last drink was 3-4 days ago     Social History   Substance and Sexual Activity  Drug Use Yes   Types: Marijuana, Cocaine   Comment: cocaine last used on friday    Social History   Socioeconomic History   Marital status: Single    Spouse name: Not on file   Number of children: Not on file   Years of education: Not on file   Highest education level: Not on file  Occupational History   Not on file  Tobacco Use   Smoking status: Every Day    Packs/day: 1.00    Types: Cigarettes   Smokeless tobacco: Never  Substance and Sexual Activity   Alcohol use: Yes    Alcohol/week: 4.0 - 5.0 standard drinks of alcohol    Types: 4 - 5 Cans of beer per week    Comment: Pt states that last drink was 3-4 days ago   Drug use: Yes    Types: Marijuana, Cocaine    Comment: cocaine last used on friday   Sexual activity: Not on file  Other Topics Concern   Not on file  Social History Narrative   Not on file   Social Determinants of Health   Financial Resource Strain: Not on file  Food Insecurity: Not on file  Transportation Needs: Not on file  Physical Activity: Not on file  Stress: Not on file  Social Connections: Not on file    Sleep: Good  Appetite:  Fair  Current Medications: Current Facility-Administered Medications  Medication Dose Route Frequency Provider Last Rate Last Admin   LORazepam (ATIVAN) tablet 0-4 mg  0-4 mg Oral Q6H Paulette Blanch, MD       thiamine (VITAMIN B1) tablet 100 mg  100 mg Oral Daily Paulette Blanch, MD   100 mg at 09/21/22 [id number]   Current Outpatient Medications  Medication Sig Dispense Refill   citalopram (CELEXA) 40 MG tablet Take 40 mg by mouth daily.  5   Multiple Vitamin (MULTIVITAMIN) tablet Take 1 tablet by mouth daily.     omeprazole (PRILOSEC) 20 MG capsule Take 20 mg by mouth 2 (two) times daily before a meal.     TRAZODONE HCL PO Take by mouth.     UNKNOWN TO PATIENT Htn med     valproic acid (DEPAKENE) 250 MG capsule  Take 3 capsules (750 mg total) by mouth at bedtime. 90 capsule 2    Lab Results:  Results for orders placed or performed during the hospital encounter of 09/20/22 (from the past 48 hour(s))  Comprehensive metabolic panel     Status: Abnormal   Collection Time: 09/20/22 11:59 PM  Result Value Ref Range   Sodium 141 135 - 145 mmol/L   Potassium 3.9 3.5 - 5.1 mmol/L   Chloride 109 98 - 111 mmol/L   CO2 24 22 - 32 mmol/L   Glucose, Bld 102 (H) 70 - 99 mg/dL    Comment: Glucose reference range applies only to samples taken after fasting for at least 8 hours.   BUN 10 6 - 20 mg/dL   Creatinine, Ser 0.73 0.61 - 1.24 mg/dL   Calcium 8.8 (L) 8.9 - 10.3 mg/dL   Total Protein 7.5 6.5 - 8.1 g/dL   Albumin 3.8 3.5 - 5.0 g/dL   AST 17 15 - 41 U/L   ALT 11 0 - 44 U/L   Alkaline Phosphatase 63 38 - 126 U/L   Total Bilirubin 0.4 0.3 - 1.2 mg/dL   GFR, Estimated >60 >60 mL/min    Comment: (NOTE) Calculated using the CKD-EPI Creatinine Equation (2021)    Anion gap 8 5 - 15    Comment: Performed at  [facility], [address] [place], [place] [address]  CBC with Differential     Status: Abnormal   Collection Time: 09/20/22 11:59 PM  Result Value Ref Range   WBC 6.3 4.0 - 10.5 K/uL   RBC 4.39 4.22 - 5.81 MIL/uL   Hemoglobin 12.1 (L) 13.0 - 17.0 g/dL   HCT 39.8 39.0 - 52.0 %   MCV 90.7 80.0 - 100.0 fL   MCH 27.6 26.0 - 34.0 pg   MCHC 30.4 30.0 - 36.0 g/dL   RDW 15.1 11.5 - 15.5 %   Platelets 221 150 - 400 K/uL   nRBC 0.0 0.0 - 0.2 %   Neutrophils Relative % 47 %   Neutro Abs 3.0 1.7 - 7.7 K/uL   Lymphocytes Relative 35 %   Lymphs Abs 2.2 0.7 - 4.0 K/uL   Monocytes Relative 11 %   Monocytes Absolute 0.7 0.1 - 1.0 K/uL   Eosinophils Relative 6 %   Eosinophils Absolute 0.4 0.0 - 0.5 K/uL   Basophils Relative 1 %   Basophils Absolute 0.0 0.0 - 0.1 K/uL   Immature Granulocytes 0 %   Abs Immature Granulocytes 0.01 0.00 - 0.07 K/uL    Comment: Performed at [facility],  [address],  [place], [place] [id number]  Salicylate level     Status: Abnormal   Collection Time: 09/20/22 11:59 PM  Result Value Ref Range   Salicylate Lvl [id number] (L) 7.0 - 30.0 mg/dL    Comment: Performed at [facility], [address] [place], [place] [address]  Ethanol     Status: Abnormal   Collection Time: 09/20/22 11:59 PM  Result Value Ref Range   Alcohol, Ethyl (B) 180 (H) <10 mg/dL    Comment: (NOTE) Lowest detectable limit for serum alcohol is 10 mg/dL.  For medical purposes only. Performed at [facility], [address] [place], [place] [address]   Acetaminophen level     Status: Abnormal   Collection Time: 09/20/22 11:59 PM  Result Value Ref Range   Acetaminophen (Tylenol), Serum <10 (L) 10 - 30 ug/mL    Comment: (NOTE) Therapeutic concentrations vary significantly. A range of 10-30 ug/mL  may be an effective concentration for many patients. However, some  are best treated at concentrations outside of this range. Acetaminophen concentrations >150 ug/mL at 4 hours after ingestion  and >50 ug/mL at 12 hours after ingestion are often associated with  toxic reactions.  Performed at [facility], [address] [place], [place] [address]   Resp panel by RT-PCR (RSV, Flu A&B, Covid) Anterior Nasal Swab     Status: None   Collection Time: 09/21/22 12:29 AM   Specimen: Anterior Nasal Swab  Result Value Ref Range   SARS Coronavirus 2 by RT PCR NEGATIVE NEGATIVE    Comment: (NOTE) SARS-CoV-2 target nucleic acids are NOT DETECTED.  The SARS-CoV-2 RNA is generally detectable in upper respiratory specimens during the acute phase of infection. The lowest concentration of SARS-CoV-2 viral copies this assay can detect is 138 copies/mL. A negative result does not preclude SARS-Cov-2 infection and should not be used as the sole basis for treatment or other patient management decisions. A negative result may occur with  improper specimen  collection/handling, submission of specimen other than nasopharyngeal swab, presence of viral mutation(s) within the areas targeted by this assay, and inadequate number of viral copies(<138 copies/mL). A negative result must be combined with clinical observations, patient history, and epidemiological information. The expected result is Negative.  Fact Sheet for Patients:  EntrepreneurPulse.com.au  Fact Sheet for Healthcare Providers:  IncredibleEmployment.be  This test is no t yet approved or cleared by the [place] FDA and  has been authorized for detection and/or diagnosis of SARS-CoV-2 by FDA under an Emergency Use Authorization (EUA). This EUA will remain  in effect (meaning this test can be used) for the duration of the COVID-19 declaration under Section 564(b)(1) of the Act, 21 U.S.C.section 360bbb-3(b)(1), unless the authorization is terminated  or revoked sooner.       Influenza A by PCR NEGATIVE NEGATIVE   Influenza B by PCR NEGATIVE NEGATIVE    Comment: (NOTE) The Xpert Xpress SARS-CoV-2/FLU/RSV plus assay is intended as an aid in the diagnosis of influenza from Nasopharyngeal swab specimens and should not be used as a sole basis for treatment. Nasal washings and aspirates are unacceptable for Xpert Xpress SARS-CoV-2/FLU/RSV testing.  Fact Sheet for Patients: EntrepreneurPulse.com.au  Fact Sheet for Healthcare Providers: IncredibleEmployment.be  This test is not yet approved or cleared by the [place] FDA and has been authorized for detection and/or diagnosis of SARS-CoV-2 by FDA under an Emergency Use Authorization (EUA). This EUA will remain in effect (meaning this test can be used) for the duration of  the COVID-19 declaration under Section 564(b)(1) of the Act, 21 U.S.C. section 360bbb-3(b)(1), unless the authorization is terminated or revoked.     Resp Syncytial Virus by PCR  NEGATIVE NEGATIVE    Comment: (NOTE) Fact Sheet for Patients: EntrepreneurPulse.com.au  Fact Sheet for Healthcare Providers: IncredibleEmployment.be  This test is not yet approved or cleared by the [place] FDA and has been authorized for detection and/or diagnosis of SARS-CoV-2 by FDA under an Emergency Use Authorization (EUA). This EUA will remain in effect (meaning this test can be used) for the duration of the COVID-19 declaration under Section 564(b)(1) of the Act, 21 U.S.C. section 360bbb-3(b)(1), unless the authorization is terminated or revoked.  Performed at [facility], [address] [place], [place] [address]   Urinalysis, Routine w reflex microscopic     Status: Abnormal   Collection Time: 09/21/22  1:00 AM  Result Value Ref Range   Color, Urine COLORLESS (A) YELLOW   APPearance CLEAR (A) CLEAR   Specific Gravity, Urine 1.002 (L) 1.005 - 1.030   pH 6.0 5.0 - 8.0   Glucose, UA NEGATIVE NEGATIVE mg/dL   Hgb urine dipstick NEGATIVE NEGATIVE   Bilirubin Urine NEGATIVE NEGATIVE   Ketones, ur NEGATIVE NEGATIVE mg/dL   Protein, ur NEGATIVE NEGATIVE mg/dL   Nitrite NEGATIVE NEGATIVE   Leukocytes,Ua NEGATIVE NEGATIVE    Comment: Performed at [facility], [address] [place], [place] [address]  Urine Drug Screen, Qualitative     Status: Abnormal   Collection Time: 09/21/22  1:00 AM  Result Value Ref Range   Tricyclic, Ur Screen NONE DETECTED NONE DETECTED   Amphetamines, Ur Screen NONE DETECTED NONE DETECTED   MDMA (Ecstasy)Ur Screen NONE DETECTED NONE DETECTED   Cocaine Metabolite,Ur [place] POSITIVE (A) NONE DETECTED   Opiate, Ur Screen NONE DETECTED NONE DETECTED   Phencyclidine (PCP) Ur S NONE DETECTED NONE DETECTED   Cannabinoid 50 Ng, Ur [place] POSITIVE (A) NONE DETECTED   Barbiturates, Ur Screen NONE DETECTED NONE DETECTED   Benzodiazepine, Ur Scrn NONE DETECTED NONE DETECTED   Methadone Scn, Ur NONE  DETECTED NONE DETECTED    Comment: (NOTE) Tricyclics + metabolites, urine    Cutoff 1000 ng/mL Amphetamines + metabolites, urine  Cutoff 1000 ng/mL MDMA (Ecstasy), urine              Cutoff 500 ng/mL Cocaine Metabolite, urine          Cutoff 300 ng/mL Opiate + metabolites, urine        Cutoff 300 ng/mL Phencyclidine (PCP), urine         Cutoff 25 ng/mL Cannabinoid, urine                 Cutoff 50 ng/mL Barbiturates + metabolites, urine  Cutoff 200 ng/mL Benzodiazepine, urine              Cutoff 200 ng/mL Methadone, urine                   Cutoff 300 ng/mL  The urine drug screen provides only a preliminary, unconfirmed analytical test result and should not be used for non-medical purposes. Clinical consideration and professional judgment should be applied to any positive drug screen result due to possible interfering substances. A more specific alternate chemical method must be used in order to obtain a confirmed analytical result. Gas chromatography / mass spectrometry (GC/MS) is the preferred confirm atory method. Performed at [facility], [address] [place],  [address]  Blood Alcohol level:  Lab Results  Component Value Date   ETH 180 (H) 09/20/2022   ETH 332 (HH) 01/18/2021    Physical Findings: AIMS:  , ,  ,  ,    CIWA:  CIWA-Ar Total: 1 COWS:     Musculoskeletal: Strength & Muscle Tone: within normal limits Gait & Station: normal Patient leans: N/A  Psychiatric Specialty Exam:  Presentation  General Appearance:  Disheveled  Eye Contact: Good  Speech: Clear and Coherent  Speech Volume: Normal  Handedness: Right   Mood and Affect  Mood: Euthymic  Affect: Congruent   Thought Process  Thought Processes: Coherent  Descriptions of Associations:Intact  Orientation:Full (Time, Place and Person)  Thought Content:WDL  History of Schizophrenia/Schizoaffective disorder:No data recorded Duration of Psychotic  Symptoms:No data recorded Hallucinations:Hallucinations: None  Ideas of Reference:None  Suicidal Thoughts:Suicidal Thoughts: No SI Passive Intent and/or Plan: Without Intent; Without Plan  Homicidal Thoughts:Homicidal Thoughts: No   Sensorium  Memory: Immediate Good; Recent Fair  Judgment: Fair  Insight: Fair   Community education officer  Concentration: Fair  Attention Span: Good  Recall: [employer] of Knowledge: Fair  Language: Fair   Psychomotor Activity  Psychomotor Activity: Psychomotor Activity: Normal   Assets  Assets: Housing; Resilience; Physical Health   Sleep  Sleep: Sleep: Good Number of Hours of Sleep: 8    Physical Exam: Physical Exam Vitals and nursing note reviewed.  HENT:     Head: Normocephalic.     Nose: No congestion or rhinorrhea.  Cardiovascular:     Rate and Rhythm: Normal rate.  Musculoskeletal:        General: Normal range of motion.     Cervical back: Normal range of motion.  Skin:    General: Skin is dry.  Neurological:     Mental Status: He is alert and oriented to person, place, and time.  Psychiatric:        Attention and Perception: Attention normal.        Mood and Affect: Mood normal.        Speech: Speech normal.        Behavior: Behavior normal.        Thought Content: Thought content normal.        Cognition and Memory: Cognition normal.        Judgment: Judgment normal.    Review of Systems  Constitutional: Negative.   HENT: Negative.    Respiratory:  Positive for cough.        EDP made aware  Psychiatric/Behavioral:  Positive for substance abuse. Negative for depression, hallucinations, memory loss and suicidal ideas. The patient is not nervous/anxious and does not have insomnia.    Blood pressure 127/89, pulse 79, temperature 97.6 F (36.4 C), temperature source Oral, resp. rate 18, height 5\' 7"  (1.702 m), weight 99.8 kg, SpO2 95 %. Body mass index is 34.46 kg/m.  Treatment Plan Summary: Plan  Patient states he has an outpatient provider and will follow up with her. Reviewed with EDP   Sherlon Handing, NP 09/21/2022, 2:36 PM

## 2022-09-21 NOTE — ED Triage Notes (Signed)
Patient to ER for c/o feeling suicidal and homicidal. Patient states he has "been just having a bad time lately". Patient states he does use cocaine and marijuana. Last use of cocaine was on 12/25. +ETOH. Last drink per patient was at approx 1430 on 12/25 (reports having 40 oz & 16 oz beer). Patient cooperative throughout.

## 2022-09-21 NOTE — ED Provider Notes (Signed)
-----------------------------------------   9:09 AM on 09/21/2022 ----------------------------------------- Patient has been seen and evaluated by psychiatry.  They believe the patient is safe for discharge home from a psychiatric standpoint.  Patient's medical workup is largely nonrevealing with a normal urinalysis, positive cocaine and cannabinoid urine toxicology, negative COVID/flu/RSV, normal chemistry, normal CBC negative salicylate and acetaminophen patient did have a mildly elevated alcohol level at 180 however this was greater than 10 hours ago.  We will discharge with outpatient resources.   Minna Antis, MD 09/21/22 [phone]

## 2022-10-07 ENCOUNTER — Emergency Department
Admission: EM | Admit: 2022-10-07 | Discharge: 2022-10-07 | Disposition: A | Discharge status: Home / Self Care | Payer: Medicaid Other | Attending: Student in an Organized Health Care Education/Training Program | Admitting: Student in an Organized Health Care Education/Training Program

## 2022-10-07 ENCOUNTER — Encounter: Payer: Self-pay | Admitting: Emergency Medicine

## 2022-10-07 ENCOUNTER — Other Ambulatory Visit: Payer: Self-pay

## 2022-10-07 DIAGNOSIS — R454 Irritability and anger: Secondary | ICD-10-CM

## 2022-10-07 LAB — CBC WITH DIFFERENTIAL/PLATELET
Abs Immature Granulocytes: 0.02 10*3/uL (ref 0.00–0.07)
Basophils Absolute: 0 10*3/uL (ref 0.0–0.1)
Basophils Relative: 1 %
Eosinophils Absolute: 0.3 10*3/uL (ref 0.0–0.5)
Eosinophils Relative: 5 %
HCT: 42.1 % (ref 39.0–52.0)
Hemoglobin: 13.1 g/dL (ref 13.0–17.0)
Immature Granulocytes: 0 %
Lymphocytes Relative: 32 %
Lymphs Abs: 2.1 10*3/uL (ref 0.7–4.0)
MCH: 27.1 pg (ref 26.0–34.0)
MCHC: 31.1 g/dL (ref 30.0–36.0)
MCV: 87 fL (ref 80.0–100.0)
Monocytes Absolute: 0.7 10*3/uL (ref 0.1–1.0)
Monocytes Relative: 11 %
Neutro Abs: 3.4 10*3/uL (ref 1.7–7.7)
Neutrophils Relative %: 51 %
Platelets: 196 10*3/uL (ref 150–400)
RBC: 4.84 MIL/uL (ref 4.22–5.81)
RDW: 15.4 % (ref 11.5–15.5)
WBC: 6.6 10*3/uL (ref 4.0–10.5)
nRBC: 0 % (ref 0.0–0.2)

## 2022-10-07 LAB — URINE DRUG SCREEN, QUALITATIVE (ARMC ONLY)
Amphetamines, Ur Screen: NOT DETECTED
Barbiturates, Ur Screen: NOT DETECTED
Benzodiazepine, Ur Scrn: NOT DETECTED
Cannabinoid 50 Ng, Ur ~~LOC~~: POSITIVE — AB
Cocaine Metabolite,Ur ~~LOC~~: POSITIVE — AB
MDMA (Ecstasy)Ur Screen: NOT DETECTED
Methadone Scn, Ur: NOT DETECTED
Opiate, Ur Screen: NOT DETECTED
Phencyclidine (PCP) Ur S: NOT DETECTED
Tricyclic, Ur Screen: NOT DETECTED

## 2022-10-07 LAB — URINALYSIS, ROUTINE W REFLEX MICROSCOPIC
Bilirubin Urine: NEGATIVE
Glucose, UA: NEGATIVE mg/dL
Hgb urine dipstick: NEGATIVE
Ketones, ur: NEGATIVE mg/dL
Leukocytes,Ua: NEGATIVE
Nitrite: NEGATIVE
Protein, ur: NEGATIVE mg/dL
Specific Gravity, Urine: 1.003 — ABNORMAL LOW (ref 1.005–1.030)
pH: 6 (ref 5.0–8.0)

## 2022-10-07 LAB — BASIC METABOLIC PANEL
Anion gap: 10 (ref 5–15)
BUN: 16 mg/dL (ref 6–20)
CO2: 24 mmol/L (ref 22–32)
Calcium: 8.6 mg/dL — ABNORMAL LOW (ref 8.9–10.3)
Chloride: 99 mmol/L (ref 98–111)
Creatinine, Ser: 0.66 mg/dL (ref 0.61–1.24)
GFR, Estimated: >60 mL/min (ref >60)
Glucose, Bld: 79 mg/dL (ref 70–99)
Potassium: 3.8 mmol/L (ref 3.5–5.1)
Sodium: 133 mmol/L — ABNORMAL LOW (ref 135–145)

## 2022-10-07 NOTE — ED Notes (Addendum)
Pt put on his coat and was talking to himself. When asked what he was doing pt states he is leaving. Pt encouraged to stay for his results and to allow the MD to finish his assessment but pt states he is leaving. MD aware.

## 2022-10-07 NOTE — ED Provider Notes (Signed)
[facility] Provider Note    Event Date/Time   First MD Initiated Contact with Patient 10/07/22 1934     (approximate)   History   Mental Health Problem   HPI  Alex [name] is a 47 y.o. male with a history of substance abuse presents to the ER for evaluation of irritability.   States he will feel angry from time to time where he may hit someone but doesn't want to hurt anyone so he came to the ER to get away.  Denies any SI.  States that he is got a lot going on in his life wants to speak with psychiatry.  Also states he is having some burning when he goes to pee.  Denies any history of STI and is not currently sexually active.  Denies any fevers or chills.     Physical Exam   Triage Vital Signs: ED Triage Vitals [10/07/22 1943]  Enc Vitals Group     BP 122/86     Pulse Rate 73     Resp 18     Temp 98.1 F (36.7 C)     Temp Source Oral     SpO2 96 %     Weight      Height      Head Circumference      Peak Flow      Pain Score      Pain Loc      Pain Edu?      Excl. in [place]?     Most recent vital signs: Vitals:   10/07/22 1943  BP: 122/86  Pulse: 73  Resp: 18  Temp: 98.1 F (36.7 C)  SpO2: 96%     Constitutional: Alert  Eyes: Conjunctivae are normal.  Head: Atraumatic. Nose: No congestion/rhinnorhea. Mouth/Throat: Mucous membranes are moist.   Neck: Painless ROM.  Cardiovascular:   Good peripheral circulation. Respiratory: Normal respiratory effort.  No retractions.  Gastrointestinal: Soft and nontender.  Musculoskeletal:  no deformity Neurologic:  MAE spontaneously. No gross focal neurologic deficits are appreciated.  Skin:  Skin is warm, dry and intact. No rash noted. Psychiatric: Mood and affect are normal. Speech and behavior are normal.    ED Results / Procedures / Treatments   Labs (all labs ordered are listed, but only abnormal results are displayed) Labs Reviewed  URINALYSIS, ROUTINE W REFLEX MICROSCOPIC -  Abnormal; Notable for the following components:      Result Value   Color, Urine COLORLESS (*)    APPearance CLEAR (*)    Specific Gravity, Urine 1.003 (*)    All other components within normal limits  URINE DRUG SCREEN, QUALITATIVE ([facility] ONLY) - Abnormal; Notable for the following components:   Cocaine Metabolite,Ur [place] POSITIVE (*)    Cannabinoid 50 Ng, Ur [place] POSITIVE (*)    All other components within normal limits  CBC WITH DIFFERENTIAL/PLATELET  BASIC METABOLIC PANEL     EKG     RADIOLOGY    PROCEDURES:  Critical Care performed:   Procedures   MEDICATIONS ORDERED IN ED: Medications - No data to display   IMPRESSION / MDM / [place] / ED COURSE  I reviewed the triage vital signs and the nursing notes.                              Differential diagnosis includes, but is not limited to, Psychosis, delirium, medication effect, noncompliance, polysubstance abuse,  Si, Hi, depression  Patient presented to ER for evaluation symptoms as described above.  He is denying any HI or ACE I.  He is here voluntarily.  Does not meet criteria for IVC.  Will check urine.  Will consult psych as he is requesting to speak with them.  Seems to be secondary to a lot of ongoing stressors at home but he does not appear to be danger to himself or others at this time.   Clinical Course as of 10/07/22 2040  Thu Oct 07, 2022  2037 Urinalysis clear.  Patient seen chatting with officer.  Had some food and states that he was feeling better and that he no longer wanted to speak with psychiatry.  Does not meet criteria for IVC. [PR]    Clinical Course User Index [PR] Merlyn Lot, MD     FINAL CLINICAL IMPRESSION(S) / ED DIAGNOSES   Final diagnoses:  Irritability and anger     Rx / DC Orders   ED Discharge Orders     None        Note:  This document was prepared using Dragon voice recognition software and may include unintentional dictation errors.     Merlyn Lot, MD 10/07/22 2040

## 2022-10-07 NOTE — ED Triage Notes (Addendum)
Pt presents to ED with c/o feeling like he wanted to hit someone. Pt states he has not been doing well since he got out of [facility] Nov 1. Pt states he has been living with his mom and brother and they have not been getting along. Pt states he wants just to stay here a night or two and then go back home and he does not want to be sent anywhere else. Pt smiling and talkative during triage with no distress noted. Pt asking if this nurse is still married or thinking about getting divorced. When told no he said "that's too bad cause I like you".

## 2022-10-07 NOTE — ED Notes (Signed)
Pt given [place] sandwich tray and beverage

## 2022-10-07 NOTE — ED Notes (Signed)
Pt mom called and informed of pt leaving. Verbalized understanding and states "that's fine he will have to find his own way home".

## 2022-10-10 ENCOUNTER — Emergency Department: Payer: Self-pay

## 2022-10-10 ENCOUNTER — Observation Stay
Admission: EM | Admit: 2022-10-10 | Discharge: 2022-10-11 | Discharge status: Against Medical Advice | Payer: Self-pay | Attending: Internal Medicine | Admitting: Internal Medicine

## 2022-10-10 DIAGNOSIS — Z1152 Encounter for screening for COVID-19: Secondary | ICD-10-CM | POA: Insufficient documentation

## 2022-10-10 DIAGNOSIS — F418 Other specified anxiety disorders: Secondary | ICD-10-CM

## 2022-10-10 DIAGNOSIS — F1910 Other psychoactive substance abuse, uncomplicated: Secondary | ICD-10-CM

## 2022-10-10 DIAGNOSIS — R55 Syncope and collapse: Principal | ICD-10-CM | POA: Insufficient documentation

## 2022-10-10 DIAGNOSIS — Z79899 Other long term (current) drug therapy: Secondary | ICD-10-CM | POA: Insufficient documentation

## 2022-10-10 DIAGNOSIS — F17200 Nicotine dependence, unspecified, uncomplicated: Secondary | ICD-10-CM

## 2022-10-10 DIAGNOSIS — E6609 Other obesity due to excess calories: Secondary | ICD-10-CM | POA: Insufficient documentation

## 2022-10-10 DIAGNOSIS — M79605 Pain in left leg: Secondary | ICD-10-CM | POA: Insufficient documentation

## 2022-10-10 DIAGNOSIS — Z6836 Body mass index (BMI) 36.0-36.9, adult: Secondary | ICD-10-CM | POA: Insufficient documentation

## 2022-10-10 DIAGNOSIS — E669 Obesity, unspecified: Secondary | ICD-10-CM

## 2022-10-10 DIAGNOSIS — F17210 Nicotine dependence, cigarettes, uncomplicated: Secondary | ICD-10-CM | POA: Insufficient documentation

## 2022-10-10 DIAGNOSIS — N50819 Testicular pain, unspecified: Secondary | ICD-10-CM

## 2022-10-10 DIAGNOSIS — N50811 Right testicular pain: Secondary | ICD-10-CM | POA: Insufficient documentation

## 2022-10-10 DIAGNOSIS — M79604 Pain in right leg: Secondary | ICD-10-CM | POA: Insufficient documentation

## 2022-10-10 DIAGNOSIS — R059 Cough, unspecified: Secondary | ICD-10-CM

## 2022-10-10 DIAGNOSIS — F1010 Alcohol abuse, uncomplicated: Secondary | ICD-10-CM | POA: Diagnosis present

## 2022-10-10 DIAGNOSIS — D649 Anemia, unspecified: Secondary | ICD-10-CM | POA: Insufficient documentation

## 2022-10-10 DIAGNOSIS — Z91010 Allergy to peanuts: Secondary | ICD-10-CM | POA: Insufficient documentation

## 2022-10-10 DIAGNOSIS — F1410 Cocaine abuse, uncomplicated: Secondary | ICD-10-CM

## 2022-10-10 LAB — URINE DRUG SCREEN, QUALITATIVE (ARMC ONLY)
Amphetamines, Ur Screen: NOT DETECTED
Barbiturates, Ur Screen: NOT DETECTED
Benzodiazepine, Ur Scrn: NOT DETECTED
Cannabinoid 50 Ng, Ur ~~LOC~~: POSITIVE — AB
Cocaine Metabolite,Ur ~~LOC~~: POSITIVE — AB
MDMA (Ecstasy)Ur Screen: NOT DETECTED
Methadone Scn, Ur: NOT DETECTED
Opiate, Ur Screen: NOT DETECTED
Phencyclidine (PCP) Ur S: NOT DETECTED
Tricyclic, Ur Screen: POSITIVE — AB

## 2022-10-10 LAB — CHLAMYDIA/NGC RT PCR (ARMC ONLY)
Chlamydia Tr: NOT DETECTED
N gonorrhoeae: NOT DETECTED

## 2022-10-10 LAB — COMPREHENSIVE METABOLIC PANEL
ALT: 13 U/L (ref 0–44)
AST: 25 U/L (ref 15–41)
Albumin: 3.7 g/dL (ref 3.5–5.0)
Alkaline Phosphatase: 75 U/L (ref 38–126)
Anion gap: 10 (ref 5–15)
BUN: 13 mg/dL (ref 6–20)
CO2: 26 mmol/L (ref 22–32)
Calcium: 8.9 mg/dL (ref 8.9–10.3)
Chloride: 102 mmol/L (ref 98–111)
Creatinine, Ser: 1.13 mg/dL (ref 0.61–1.24)
GFR, Estimated: >60 mL/min (ref >60)
Glucose, Bld: 121 mg/dL — ABNORMAL HIGH (ref 70–99)
Potassium: 4 mmol/L (ref 3.5–5.1)
Sodium: 138 mmol/L (ref 135–145)
Total Bilirubin: 0.5 mg/dL (ref 0.3–1.2)
Total Protein: 7.6 g/dL (ref 6.5–8.1)

## 2022-10-10 LAB — CBC
HCT: 35.9 % — ABNORMAL LOW (ref 39.0–52.0)
Hemoglobin: 11 g/dL — ABNORMAL LOW (ref 13.0–17.0)
MCH: 27.2 pg (ref 26.0–34.0)
MCHC: 30.6 g/dL (ref 30.0–36.0)
MCV: 88.6 fL (ref 80.0–100.0)
Platelets: 189 10*3/uL (ref 150–400)
RBC: 4.05 MIL/uL — ABNORMAL LOW (ref 4.22–5.81)
RDW: 15.6 % — ABNORMAL HIGH (ref 11.5–15.5)
WBC: 7.2 10*3/uL (ref 4.0–10.5)
nRBC: 0 % (ref 0.0–0.2)

## 2022-10-10 LAB — CBC WITH DIFFERENTIAL/PLATELET
Abs Immature Granulocytes: 0.05 10*3/uL (ref 0.00–0.07)
Basophils Absolute: 0 10*3/uL (ref 0.0–0.1)
Basophils Relative: 0 %
Eosinophils Absolute: 0.3 10*3/uL (ref 0.0–0.5)
Eosinophils Relative: 3 %
HCT: 38.7 % — ABNORMAL LOW (ref 39.0–52.0)
Hemoglobin: 12 g/dL — ABNORMAL LOW (ref 13.0–17.0)
Immature Granulocytes: 1 %
Lymphocytes Relative: 11 %
Lymphs Abs: 1.1 10*3/uL (ref 0.7–4.0)
MCH: 27.6 pg (ref 26.0–34.0)
MCHC: 31 g/dL (ref 30.0–36.0)
MCV: 89 fL (ref 80.0–100.0)
Monocytes Absolute: 1 10*3/uL (ref 0.1–1.0)
Monocytes Relative: 10 %
Neutro Abs: 7.5 10*3/uL (ref 1.7–7.7)
Neutrophils Relative %: 75 %
Platelets: 227 10*3/uL (ref 150–400)
RBC: 4.35 MIL/uL (ref 4.22–5.81)
RDW: 15.5 % (ref 11.5–15.5)
WBC: 10.1 10*3/uL (ref 4.0–10.5)
nRBC: 0 % (ref 0.0–0.2)

## 2022-10-10 LAB — URINALYSIS, ROUTINE W REFLEX MICROSCOPIC
Bilirubin Urine: NEGATIVE
Glucose, UA: NEGATIVE mg/dL
Hgb urine dipstick: NEGATIVE
Ketones, ur: 5 mg/dL — AB
Leukocytes,Ua: NEGATIVE
Nitrite: NEGATIVE
Protein, ur: NEGATIVE mg/dL
Specific Gravity, Urine: 1.017 (ref 1.005–1.030)
pH: 5 (ref 5.0–8.0)

## 2022-10-10 LAB — CREATININE, SERUM
Creatinine, Ser: 0.87 mg/dL (ref 0.61–1.24)
GFR, Estimated: >60 mL/min (ref >60)

## 2022-10-10 LAB — TROPONIN I (HIGH SENSITIVITY)
Troponin I (High Sensitivity): 4 ng/L (ref <18)
Troponin I (High Sensitivity): 3 ng/L (ref <18)

## 2022-10-10 LAB — RESP PANEL BY RT-PCR (RSV, FLU A&B, COVID)  RVPGX2
Influenza A by PCR: NEGATIVE
Influenza B by PCR: NEGATIVE
Resp Syncytial Virus by PCR: NEGATIVE
SARS Coronavirus 2 by RT PCR: NEGATIVE

## 2022-10-10 LAB — MAGNESIUM: Magnesium: 2.4 mg/dL (ref 1.7–2.4)

## 2022-10-10 LAB — D-DIMER, QUANTITATIVE: D-Dimer, Quant: 0.61 ug/mL-FEU — ABNORMAL HIGH (ref 0.00–0.50)

## 2022-10-10 LAB — LIPASE, BLOOD: Lipase: 71 U/L — ABNORMAL HIGH (ref 11–51)

## 2022-10-10 LAB — ETHANOL: Alcohol, Ethyl (B): <10 mg/dL (ref <10)

## 2022-10-10 LAB — CBG MONITORING, ED: Glucose-Capillary: 111 mg/dL — ABNORMAL HIGH (ref 70–99)

## 2022-10-10 MED ORDER — NICOTINE 21 MG/24HR TD PT24
21.0000 mg | MEDICATED_PATCH | Freq: Every day | TRANSDERMAL | Status: DC
  Administered 2022-10-10 – 2022-10-11 (×2): 21 mg via TRANSDERMAL
  Filled 2022-10-10 (×2): qty 1

## 2022-10-10 MED ORDER — SODIUM CHLORIDE 0.9 % IV BOLUS (SEPSIS)
1000.0000 mL | Freq: Once | INTRAVENOUS | Status: AC
  Administered 2022-10-10: 1000 mL via INTRAVENOUS

## 2022-10-10 MED ORDER — SODIUM CHLORIDE 0.9% FLUSH: 3.0000 mL | Freq: Two times a day (BID) | INTRAVENOUS | Status: DC

## 2022-10-10 MED ORDER — ACETAMINOPHEN 500 MG PO TABS
1000.0000 mg | ORAL_TABLET | Freq: Once | ORAL | Status: AC
  Administered 2022-10-10: 1000 mg via ORAL
  Filled 2022-10-10: qty 2

## 2022-10-10 MED ORDER — ENOXAPARIN SODIUM 60 MG/0.6ML IJ SOSY: 0.5000 mg/kg | PREFILLED_SYRINGE | INTRAMUSCULAR | Status: DC

## 2022-10-10 MED ORDER — THIAMINE HCL 100 MG/ML IJ SOLN: 100.0000 mg | Freq: Every day | INTRAMUSCULAR | Status: DC

## 2022-10-10 MED ORDER — LORAZEPAM 2 MG PO TABS: 0.0000 mg | ORAL_TABLET | Freq: Four times a day (QID) | ORAL | Status: DC

## 2022-10-10 MED ORDER — LORAZEPAM 2 MG PO TABS: 0.0000 mg | ORAL_TABLET | Freq: Two times a day (BID) | ORAL | Status: DC

## 2022-10-10 MED ORDER — LORAZEPAM 2 MG/ML IJ SOLN: 1.0000 mg | INTRAMUSCULAR | Status: DC | PRN

## 2022-10-10 MED ORDER — ONDANSETRON HCL 4 MG/2ML IJ SOLN: 4.0000 mg | Freq: Four times a day (QID) | INTRAMUSCULAR | Status: DC | PRN

## 2022-10-10 MED ORDER — ACETAMINOPHEN 650 MG RE SUPP: 650.0000 mg | Freq: Four times a day (QID) | RECTAL | Status: DC | PRN

## 2022-10-10 MED ORDER — ADULT MULTIVITAMIN W/MINERALS CH
1.0000 | ORAL_TABLET | Freq: Every day | ORAL | Status: DC
  Administered 2022-10-10 – 2022-10-11 (×2): 1 via ORAL
  Filled 2022-10-10 (×2): qty 1

## 2022-10-10 MED ORDER — OXYCODONE-ACETAMINOPHEN 5-325 MG PO TABS: 1.0000 | ORAL_TABLET | ORAL | Status: DC | PRN

## 2022-10-10 MED ORDER — THIAMINE HCL 100 MG/ML IJ SOLN
100.0000 mg | Freq: Once | INTRAMUSCULAR | Status: AC
  Administered 2022-10-10: 100 mg via INTRAVENOUS
  Filled 2022-10-10: qty 2

## 2022-10-10 MED ORDER — SODIUM CHLORIDE 0.9 % IV SOLN: INTRAVENOUS | Status: DC

## 2022-10-10 MED ORDER — FOLIC ACID 5 MG/ML IJ SOLN
1.0000 mg | Freq: Every day | INTRAMUSCULAR | Status: AC
  Filled 2022-10-10: qty 0.2

## 2022-10-10 MED ORDER — ACETAMINOPHEN 325 MG PO TABS: 650.0000 mg | ORAL_TABLET | Freq: Four times a day (QID) | ORAL | Status: DC | PRN

## 2022-10-10 MED ORDER — MAGNESIUM HYDROXIDE 400 MG/5ML PO SUSP: 30.0000 mL | Freq: Every day | ORAL | Status: DC | PRN

## 2022-10-10 MED ORDER — TRAZODONE HCL 50 MG PO TABS
25.0000 mg | ORAL_TABLET | Freq: Every evening | ORAL | Status: DC | PRN
  Administered 2022-10-10: 25 mg via ORAL
  Filled 2022-10-10: qty 1

## 2022-10-10 MED ORDER — FOLIC ACID 1 MG PO TABS
1.0000 mg | ORAL_TABLET | Freq: Every day | ORAL | Status: DC
  Administered 2022-10-10 – 2022-10-11 (×2): 1 mg via ORAL
  Filled 2022-10-10 (×2): qty 1

## 2022-10-10 MED ORDER — THIAMINE MONONITRATE 100 MG PO TABS
100.0000 mg | ORAL_TABLET | Freq: Every day | ORAL | Status: DC
  Administered 2022-10-11: 100 mg via ORAL
  Filled 2022-10-10: qty 1

## 2022-10-10 MED ORDER — KETOROLAC TROMETHAMINE 30 MG/ML IJ SOLN
30.0000 mg | Freq: Once | INTRAMUSCULAR | Status: AC
  Administered 2022-10-10: 30 mg via INTRAVENOUS
  Filled 2022-10-10: qty 1

## 2022-10-10 MED ORDER — ONDANSETRON HCL 4 MG/2ML IJ SOLN
4.0000 mg | Freq: Once | INTRAMUSCULAR | Status: AC
  Administered 2022-10-10: 4 mg via INTRAVENOUS
  Filled 2022-10-10: qty 2

## 2022-10-10 MED ORDER — ONDANSETRON HCL 4 MG PO TABS: 4.0000 mg | ORAL_TABLET | Freq: Four times a day (QID) | ORAL | Status: DC | PRN

## 2022-10-10 MED ORDER — LORAZEPAM 1 MG PO TABS: 1.0000 mg | ORAL_TABLET | ORAL | Status: DC | PRN

## 2022-10-10 MED ORDER — IOHEXOL 350 MG/ML SOLN
75.0000 mL | Freq: Once | INTRAVENOUS | Status: AC | PRN
  Administered 2022-10-10: 75 mL via INTRAVENOUS

## 2022-10-10 NOTE — ED Notes (Signed)
This nurse attempt to retrieve pt's blood for morning labs and was unable to do so. Lab is called at this time to be notified and was requested for someone to get the blood.

## 2022-10-10 NOTE — ED Notes (Signed)
Pt refusing to go to hallway bed stating "I have been here all day and I want to watch TV"

## 2022-10-10 NOTE — Progress Notes (Signed)
PHARMACIST - PHYSICIAN COMMUNICATION  CONCERNING:  Enoxaparin (Lovenox) for DVT Prophylaxis    RECOMMENDATION: Patient was prescribed enoxaprin 40mg  q24 hours for VTE prophylaxis.   There were no vitals filed for this visit.  There is no height or weight on file to calculate BMI.  Estimated Creatinine Clearance: 90.4 mL/min (by C-G formula based on SCr of 1.13 mg/dL).   Based on [place] patient is candidate for enoxaparin 0.5mg /kg TBW SQ every 24 hours based on BMI being >30.  DESCRIPTION: Pharmacy has adjusted enoxaparin dose per [facility] policy.  Patient is now receiving enoxaparin 0.5 mg/kg every 24 hours   Renda Rolls, PharmD, [facility] 10/10/2022 6:08 AM

## 2022-10-10 NOTE — ED Notes (Signed)
Report given to [facility] RN at this time.

## 2022-10-10 NOTE — ED Notes (Signed)
Delay in giving medications due to patient sleeping.

## 2022-10-10 NOTE — ED Notes (Signed)
Unable to complete CIWA due to pt sleeping at this time

## 2022-10-10 NOTE — ED Provider Notes (Signed)
[facility] Provider Note    Event Date/Time   First MD Initiated Contact with Patient 10/10/22 0016     (approximate)   History   Loss of Consciousness   HPI  Alex [name] is a 47 y.o. male history of substance abuse, depression, seizures who presents to the emergency department with multiple complaints.  Patient presents with EMS.  Patient states he was standing outside smoking a cigarette when he passed out.  No witnessed seizure-like activity and EMS reports he was not postictal.  He denies any preceding symptoms that led to him to pass out including chest pain, shortness of breath or palpitations.  He complains of recent cough, congestion and thinks he has a "cold".  No fever.  Also complains of nausea but no vomiting or diarrhea.  No abdominal pain.  Complaining of bilateral leg pain that has been going on for over a month.  Complaining also of right-sided testicular pain ongoing for over a month.  Has had dysuria but no discharge.  Last drink alcohol around 1 PM today and snorted cocaine this morning.   History provided by patient, EMS.    Past Medical History:  Diagnosis Date   Alcohol abuse    Anxiety    Cocaine abuse (HCC) 05/27/2016   Depression    Seizures (HCC)     Past Surgical History:  Procedure Laterality Date   FINGER SURGERY      MEDICATIONS:  Prior to Admission medications   Medication Sig Start Date End Date Taking? Authorizing Provider  citalopram (CELEXA) 40 MG tablet Take 40 mg by mouth daily. 02/27/18   [provider]  Multiple Vitamin (MULTIVITAMIN) tablet Take 1 tablet by mouth daily.    [provider]  omeprazole (PRILOSEC) 20 MG capsule Take 20 mg by mouth 2 (two) times daily before a meal.    [provider]  TRAZODONE HCL PO Take by mouth.    [provider]  UNKNOWN TO PATIENT Htn med    [provider]  valproic acid (DEPAKENE) 250 MG capsule Take 3 capsules  (750 mg total) by mouth at bedtime. 07/11/18 07/11/19  Loleta Rose, MD    Physical Exam   Triage Vital Signs: ED Triage Vitals  Enc Vitals Group     BP 10/10/22 0021 120/77     Pulse Rate 10/10/22 0021 (!) 101     Resp 10/10/22 0021 (!) 21     Temp 10/10/22 0021 98.2 F (36.8 C)     Temp Source 10/10/22 0021 Oral     SpO2 10/10/22 0021 97 %     Weight --      Height --      Head Circumference --      Peak Flow --      Pain Score 10/10/22 0013 10     Pain Loc --      Pain Edu? --      Excl. in GC? --      Most recent vital signs: Vitals:   10/10/22 0021 10/10/22 0030  BP: 120/77 126/76  Pulse: (!) 101 (!) 104  Resp: (!) 21 (!) 25  Temp: 98.2 F (36.8 C)   SpO2: 97% 99%    CONSTITUTIONAL: Alert and oriented and responds appropriately to questions.  Chronically ill-appearing HEAD: Normocephalic, atraumatic EYES: Conjunctivae clear, pupils appear equal, sclera nonicteric ENT: normal nose; moist mucous membranes NECK: Supple, normal ROM no midline spinal tenderness or step-off or deformity CARD: Regular  and tachycardic; S1 and S2 appreciated; no murmurs, no clicks, no rubs, no gallops RESP: Normal chest excursion, slightly tachypneic; breath sounds clear and equal bilaterally; no wheezes, no rhonchi, no rales, no hypoxia or respiratory distress, speaking full sentences ABD/GI: Normal bowel sounds; non-distended; soft, non-tender, no rebound, no guarding, no peritoneal signs GU:  Normal external genitalia, circumcised male, normal penile shaft, no blood or discharge at the urethral meatus, patient has right-sided testicular pain on exam, no scrotal masses or swelling, no hernias appreciated, 2+ femoral pulses bilaterally; no perineal erythema, warmth, subcutaneous air or crepitus; no high riding testicle, normal bilateral cremasteric reflex.  Chaperone present for exam. BACK: The back appears normal no midline spinal tenderness or step-off or deformity EXT: Tender to  palpation throughout the distal lower extremities bilaterally without redness, abnormal warmth, soft tissue swelling, ecchymosis or deformity.  No joint effusions.  Compartments soft.  Does have bilateral calf tenderness.  2+ DP pulses bilaterally.  Patient begins yelling out in pain before I have even touched his legs. SKIN: Normal color for age and race; warm; no rash on exposed skin NEURO: Moves all extremities equally, normal speech sensation, no facial asymmetry PSYCH: The patient's mood and manner are appropriate.   ED Results / Procedures / Treatments   LABS: (all labs ordered are listed, but only abnormal results are displayed) Labs Reviewed  CBC WITH DIFFERENTIAL/PLATELET - Abnormal; Notable for the following components:      Result Value   Hemoglobin 12.0 (*)    HCT 38.7 (*)    All other components within normal limits  COMPREHENSIVE METABOLIC PANEL - Abnormal; Notable for the following components:   Glucose, Bld 121 (*)    All other components within normal limits  LIPASE, BLOOD - Abnormal; Notable for the following components:   Lipase 71 (*)    All other components within normal limits  URINALYSIS, ROUTINE W REFLEX MICROSCOPIC - Abnormal; Notable for the following components:   Color, Urine YELLOW (*)    APPearance CLEAR (*)    Ketones, ur 5 (*)    All other components within normal limits  URINE DRUG SCREEN, QUALITATIVE ([facility] ONLY) - Abnormal; Notable for the following components:   Tricyclic, Ur Screen POSITIVE (*)    Cocaine Metabolite,Ur [place] POSITIVE (*)    Cannabinoid 50 Ng, Ur [place] POSITIVE (*)    All other components within normal limits  D-DIMER, QUANTITATIVE (NOT AT [facility]) - Abnormal; Notable for the following components:   D-Dimer, Quant 0.61 (*)    All other components within normal limits  CHLAMYDIA/NGC RT PCR ([facility] ONLY)            RESP PANEL BY RT-PCR (RSV, FLU A&B, COVID)  [id number]  ETHANOL  MAGNESIUM  TROPONIN I (HIGH SENSITIVITY)  TROPONIN I (HIGH  SENSITIVITY)     EKG:  EKG Interpretation  Date/Time:  Sunday October 10 2022 00:19:24 EST Ventricular Rate:  91 PR Interval:  154 QRS Duration: 102 QT Interval:  371 QTC Calculation: 457 R Axis:   -22 Text Interpretation: Sinus rhythm Borderline left axis deviation Confirmed by Pryor Curia [phone]) on 10/10/2022 12:55:23 AM         RADIOLOGY: My personal review and interpretation of imaging: Patient's imaging showed no acute abnormality.  I have personally reviewed all radiology reports.   DG Ankle Complete Right  Result Date: 10/10/2022 CLINICAL DATA:  47 year old male status post fall with lower extremity pain. EXAM: RIGHT ANKLE - COMPLETE 3+ VIEW COMPARISON:  Right  foot series today. Right ankle series 08/19/2017. FINDINGS: Chronic degenerative spurring at the calcaneus, talus, tibial plafond, and both malleoli. But mortise joint alignment is maintained. No ankle joint effusion is evident. No acute fracture or dislocation identified. No discrete soft tissue injury. 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 No acute fracture or dislocation identified. Electronically Signed   By: Odessa Fleming M.D.   On: 10/10/2022 05:28   DG Foot Complete Left  Result Date: 10/10/2022 CLINICAL DATA:  47 year old male with history of trauma from a fall complaining of lower extremity pain. EXAM: LEFT FOOT - COMPLETE 3+ VIEW; LEFT ANKLE COMPLETE - 3+ VIEW COMPARISON:  Left ankle radiographs 11/09/2016. FINDINGS: Multiple views of the left foot and ankle demonstrate no definite acute displaced fracture or dislocation. Throughout the foot and ankle there are multifocal degenerative changes of osteoarthritis with joint space narrowing, subchondral sclerosis, subchondral cyst formation and osteophyte formation, most evident in the midfoot, hindfoot and at the tibiotalar joint. Small plantar calcaneal enthesophyte incidentally noted. IMPRESSION: 1. No acute  radiographic abnormality of the left foot or ankle. 2. 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 3. Small plantar calcaneal enthesophyte. Electronically Signed   By: Trudie Reed M.D.   On: 10/10/2022 05:27   DG Ankle Complete Left  Result Date: 10/10/2022 CLINICAL DATA:  48 year old male with history of trauma from a fall complaining of lower extremity pain. EXAM: LEFT FOOT - COMPLETE 3+ VIEW; LEFT ANKLE COMPLETE - 3+ VIEW COMPARISON:  Left ankle radiographs 11/09/2016. FINDINGS: Multiple views of the left foot and ankle demonstrate no definite acute displaced fracture or dislocation. Throughout the foot and ankle there are multifocal degenerative changes of osteoarthritis with joint space narrowing, subchondral sclerosis, subchondral cyst formation and osteophyte formation, most evident in the midfoot, hindfoot and at the tibiotalar joint. Small plantar calcaneal enthesophyte incidentally noted. IMPRESSION: 1. No acute radiographic abnormality of the left foot or ankle. 2. 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 3. Small plantar calcaneal enthesophyte. Electronically Signed   By: Trudie Reed M.D.   On: 10/10/2022 05:27   DG Foot Complete Right  Result Date: 10/10/2022 CLINICAL DATA:  47 year old male status post fall with lower extremity pain. EXAM: RIGHT FOOT COMPLETE - 3+ VIEW COMPARISON:  Right ankle series reported separately. FINDINGS: Three views of the right foot. Maintained alignment. Chronic degenerative spurring of the calcaneus and the anterior talus. Chronic deformity at the base of the 2nd middle phalanx. Chronic appearing deformity also of the 5th proximal phalanx. No acute fracture or dislocation identified. No discrete soft tissue injury. IMPRESSION: No acute fracture or dislocation identified about the right foot. Electronically Signed   By: Odessa Fleming M.D.   On:  10/10/2022 05:26   CT Angio Chest PE W and/or Wo Contrast  Result Date: 10/10/2022 CLINICAL DATA:  Positive D-dimer and chest pain, initial encounter EXAM: CT ANGIOGRAPHY CHEST WITH CONTRAST TECHNIQUE: Multidetector CT imaging of the chest was performed using the standard protocol during bolus administration of intravenous contrast. Multiplanar CT image reconstructions and MIPs were obtained to evaluate the vascular anatomy. RADIATION DOSE REDUCTION: This exam was performed according to the departmental dose-optimization program which includes automated exposure control, adjustment of the mA and/or kV according to patient size and/or use of iterative reconstruction technique. CONTRAST:  42mL OMNIPAQUE IOHEXOL 350 MG/ML SOLN COMPARISON:  None Available. FINDINGS: Cardiovascular: Thoracic aorta and its branches  are within normal limits. Mild atherosclerotic calcifications are seen. No aneurysmal dilatation or dissection is noted. Heart is not significantly enlarged in size. The pulmonary artery shows a normal branching pattern bilaterally. No filling defect to suggest pulmonary embolism is noted. Mediastinum/Nodes: Thoracic inlet is within normal limits. No hilar or mediastinal adenopathy is noted. The esophagus as visualized is within normal limits. Lungs/Pleura: The lungs are well aerated bilaterally. No focal infiltrate or sizable effusion is noted. No parenchymal nodules are seen. Upper Abdomen: Visualized upper abdomen is within normal limits. Musculoskeletal: Degenerative changes of the thoracic spine are noted. No acute rib abnormality is seen. Review of the MIP images confirms the above findings. IMPRESSION: No evidence of pulmonary emboli. No acute abnormality seen. Aortic Atherosclerosis (ICD10-I70.0). Electronically Signed   By: Alcide CleverMark  Lukens M.D.   On: 10/10/2022 03:48   US SCROTUM W/DOPPLER  Result Date: 10/10/2022 CLINICAL DATA:  Right-sided testicular pain EXAM: SCROTAL ULTRASOUND DOPPLER  ULTRASOUND OF THE TESTICLES TECHNIQUE: Complete ultrasound examination of the testicles, epididymis, and other scrotal structures was performed. Color and spectral Doppler ultrasound were also utilized to evaluate blood flow to the testicles. COMPARISON:  None Available. FINDINGS: Right testicle Measurements: 4.6 x 2.6 x 3.4 cm. No mass or microlithiasis visualized. Scrotal pearl is noted on the right. Left testicle Measurements: 4.3 x 2.3 x 2.9 cm. No mass or microlithiasis visualized. Right epididymis:  3 mm cyst is noted. Left epididymis:  6 mm cyst is noted. Hydrocele:  Small bilateral hydroceles are seen. Varicocele:  Left-sided varicocele is noted. Pulsed Doppler interrogation of both testes demonstrates normal low resistance arterial and venous waveforms bilaterally. IMPRESSION: Bilateral epididymal cysts and hydroceles. Small left varicocele. No acute abnormality to correspond with the given clinical history is noted. Electronically Signed   By: Alcide CleverMark  Lukens M.D.   On: 10/10/2022 02:13   DG Chest Portable 1 View  Result Date: 10/10/2022 CLINICAL DATA:  Cough and syncopal episode EXAM: PORTABLE CHEST 1 VIEW COMPARISON:  05/01/2021 FINDINGS: Cardiac shadow is within normal limits and stable. Lungs are clear bilaterally. No bony abnormality is noted. IMPRESSION: No active disease. Electronically Signed   By: Alcide CleverMark  Lukens M.D.   On: 10/10/2022 02:11   US Venous Img Lower Bilateral (DVT)  Result Date: 10/10/2022 CLINICAL DATA:  Bilateral lower extremity pain EXAM: BILATERAL LOWER EXTREMITY VENOUS DOPPLER ULTRASOUND TECHNIQUE: Gray-scale sonography with graded compression, as well as color Doppler and duplex ultrasound were performed to evaluate the lower extremity deep venous systems from the level of the common femoral vein and including the common femoral, femoral, profunda femoral, popliteal and calf veins including the posterior tibial, peroneal and gastrocnemius veins when visible. The superficial  great saphenous vein was also interrogated. Spectral Doppler was utilized to evaluate flow at rest and with distal augmentation maneuvers in the common femoral, femoral and popliteal veins. COMPARISON:  None Available. FINDINGS: RIGHT LOWER EXTREMITY Common Femoral Vein: No evidence of thrombus. Normal compressibility, respiratory phasicity and response to augmentation. Saphenofemoral Junction: No evidence of thrombus. Normal compressibility and flow on color Doppler imaging. Profunda Femoral Vein: No evidence of thrombus. Normal compressibility and flow on color Doppler imaging. Femoral Vein: No evidence of thrombus. Normal compressibility, respiratory phasicity and response to augmentation. Popliteal Vein: No evidence of thrombus. Normal compressibility, respiratory phasicity and response to augmentation. Calf Veins: No evidence of thrombus. Normal compressibility and flow on color Doppler imaging. Superficial Great Saphenous Vein: No evidence of thrombus. Normal compressibility. Venous Reflux:  None. Other Findings:  None.  LEFT LOWER EXTREMITY Common Femoral Vein: No evidence of thrombus. Normal compressibility, respiratory phasicity and response to augmentation. Saphenofemoral Junction: No evidence of thrombus. Normal compressibility and flow on color Doppler imaging. Profunda Femoral Vein: No evidence of thrombus. Normal compressibility and flow on color Doppler imaging. Femoral Vein: No evidence of thrombus. Normal compressibility, respiratory phasicity and response to augmentation. Popliteal Vein: No evidence of thrombus. Normal compressibility, respiratory phasicity and response to augmentation. Calf Veins: No evidence of thrombus. Normal compressibility and flow on color Doppler imaging. Superficial Great Saphenous Vein: No evidence of thrombus. Normal compressibility. Venous Reflux:  None. Other Findings:  None. IMPRESSION: No evidence of deep venous thrombosis in either lower extremity. Electronically  Signed   By: Sammie Bench M.D.   On: 10/10/2022 01:56   CT Cervical Spine Wo Contrast  Result Date: 10/10/2022 CLINICAL DATA:  Neck trauma. EXAM: CT CERVICAL SPINE WITHOUT CONTRAST TECHNIQUE: Multidetector CT imaging of the cervical spine was performed without intravenous contrast. Multiplanar CT image reconstructions were also generated. RADIATION DOSE REDUCTION: This exam was performed according to the departmental dose-optimization program which includes automated exposure control, adjustment of the mA and/or kV according to patient size and/or use of iterative reconstruction technique. COMPARISON:  01/18/2021 FINDINGS: Alignment: Normal. Skull base and vertebrae: No acute fracture. No primary bone lesion or focal pathologic process. Soft tissues and spinal canal: No prevertebral fluid or swelling. No visible canal hematoma. Disc levels: Degenerative changes identified with disc space narrowing and marginal osteophyte formation from C3-4 through C7-T1. Neural foraminal narrowing noted right worse than left at C4-5, bilaterally at C5-6 and C6-C7. Upper chest: Negative. IMPRESSION: Degenerative changes.  No acute osseous abnormalities. Electronically Signed   By: Sammie Bench M.D.   On: 10/10/2022 01:00   CT HEAD WO CONTRAST (5MM)  Result Date: 10/10/2022 CLINICAL DATA:  Syncope, seizure. EXAM: CT HEAD WITHOUT CONTRAST TECHNIQUE: Contiguous axial images were obtained from the base of the skull through the vertex without intravenous contrast. RADIATION DOSE REDUCTION: This exam was performed according to the departmental dose-optimization program which includes automated exposure control, adjustment of the mA and/or kV according to patient size and/or use of iterative reconstruction technique. COMPARISON:  05/29/2017 FINDINGS: Brain: No evidence of acute infarction, hemorrhage, hydrocephalus, extra-axial collection or mass lesion/mass effect. Vascular: No hyperdense vessel or unexpected  calcification. Skull: Normal. Negative for fracture or focal lesion. Sinuses/Orbits: No acute finding. IMPRESSION: No acute intracranial process. Electronically Signed   By: Sammie Bench M.D.   On: 10/10/2022 00:52     PROCEDURES:  Critical Care performed: No     .1-3 Lead EKG Interpretation  Performed by: Swetha Rayle, Delice Bison, DO Authorized by: Jamicia Haaland, Delice Bison, DO     Interpretation: abnormal     ECG rate:  101   ECG rate assessment: tachycardic     Rhythm: sinus tachycardia     Ectopy: none     Conduction: normal       IMPRESSION / MDM / ASSESSMENT AND PLAN / ED COURSE  I reviewed the triage vital signs and the nursing notes.    Patient here after syncopal event.  Also complaining of bilateral leg pain, right testicular pain, URI symptoms, nausea.  The patient is on the cardiac monitor to evaluate for evidence of arrhythmia and/or significant heart rate changes.   DIFFERENTIAL DIAGNOSIS (includes but not limited to):   ACS, PE, DVT, dissection, arrhythmia, dehydration, substance abuse, electrolyte derangement, epididymitis, orchitis, doubt torsion, bilateral leg pain could be from neuropathy  from alcohol abuse, no signs of fracture, cellulitis, septic arthritis, gout,   Patient's presentation is most consistent with acute presentation with potential threat to life or bodily function.   PLAN: Will obtain CBC, CMP, lipase, ethanol level, analysis, urine drug screen, urine gonorrhea and chlamydia screen, troponin x 2, D-dimer.  Will obtain CT of the head and cervical spine given syncope and concern for intoxication.  Will obtain bilateral lower extremity Dopplers and scrotal ultrasound with Doppler.  EKG shows no ischemia, electrolyte derangement arrhythmia.  Will give IV fluids, thiamine, Zofran and Toradol here for symptomatic relief.   MEDICATIONS GIVEN IN ED: Medications  sodium chloride 0.9 % bolus 1,000 mL (0 mLs Intravenous Stopped 10/10/22 0330)  thiamine (VITAMIN  B1) injection 100 mg (100 mg Intravenous Given 10/10/22 0202)  ondansetron (ZOFRAN) injection 4 mg (4 mg Intravenous Given 10/10/22 0200)  ketorolac (TORADOL) 30 MG/ML injection 30 mg (30 mg Intravenous Given 10/10/22 0201)  iohexol (OMNIPAQUE) 350 MG/ML injection 75 mL (75 mLs Intravenous Contrast Given 10/10/22 0336)  acetaminophen (TYLENOL) tablet 1,000 mg (1,000 mg Oral Given 10/10/22 0500)     ED COURSE: Patient's labs unremarkable.  Normal hemoglobin, electrolytes, renal function, LFTs.  Lipase minimally elevated but no abdominal pain on exam.  Troponin x 2 negative.  Urine shows no sign infection.  Gonorrhea and Chlamydia negative.  Drug screen is positive for tricyclics, cocaine and cannabinoids.  Ethanol level negative.  D-dimer slightly elevated.  CT a of the chest obtained and reviewed/interpreted by myself and the radiologist and shows no acute abnormality.  CT of the head and cervical spine also reviewed by myself and the radiologist and shows no traumatic injury.  Chest x-ray clear.  Venous Dopplers negative.  Testicular ultrasound negative.  No cardiac arrhythmia seen on monitoring.  He is tolerating p.o.  Will attempt to ambulate patient.     4:30 AM  Patient states he is unable to ambulate due to foot and ankle pain.  He now states that this started today after the syncopal event and that he was misunderstood that the foot pain started weeks ago.  He states the foot pain is acute but the testicular pain started weeks ago.  No sign of fracture, soft tissue swelling, joint effusion on exam but will obtain x-rays.  Patient is able to move his legs in the bed without difficulty.  Denies knee or hip pain.    5:35 AM  Pt's x-rays reviewed and interpreted by myself and the radiologist and show significant arthritic changes but no fracture or dislocation.  Patient still complaining of pain in his feet and unable to ambulate and is very concerned about why he passed out today.  Will discuss with  hospitalist for possible admission for syncopal event for further cardiac monitoring, echocardiogram and physical therapy for bilateral foot pain and difficulty with ambulating secondary to pain.  Has had mild tachycardia and tachypnea here but no hypertension, tremors, vomiting, diaphoresis or other symptoms suggestive of alcohol withdrawal.  Not requiring any benzodiazepines currently.  Low suspicion that the syncopal event was an alcohol withdrawal seizure.  CONSULTS:  Consulted and discussed patient's case with hospitalist, Dr. Arville Care.  I have recommended admission and consulting physician agrees and will place admission orders.  Patient (and family if present) agree with this plan.   I reviewed all nursing notes, vitals, pertinent previous records.  All labs, EKGs, imaging ordered have been independently reviewed and interpreted by myself.    OUTSIDE RECORDS REVIEWED: Reviewed last  family medicine note September 30, 2022 for tremor.       FINAL CLINICAL IMPRESSION(S) / ED DIAGNOSES   Final diagnoses:  Syncope, unspecified syncope type  Bilateral leg pain  Substance abuse (HCC)  Right testicular pain  Cough, unspecified type     Rx / DC Orders   ED Discharge Orders     None        Note:  This document was prepared using Dragon voice recognition software and may include unintentional dictation errors.   Anthon Harpole, Layla Maw, DO 10/10/22 [phone]

## 2022-10-10 NOTE — H&P (Signed)
History and Physical    WHITMAN MEINHARDT XIH:[id number] DOB: Feb 08, 1976 DOA: 10/10/2022  Referring MD/NP/PA:   PCP: Dortha Kern, MD   Patient coming from:  The patient is coming from home.     Chief Complaint: syncope  HPI: Alex [name] is a 47 y.o. male with medical history significant of polysubstance abuse, tobacco abuse, alcohol abuse, cocaine abuse, marijuana use, depression with anxiety, ADHD, obesity, who presents with syncope.  Pt states that he was standing outside smoking a cigarette when he passed out.  He is not sure how long he passed out.  Per report, no witnessed seizure-like activity and EMS reports he was not postictal.  He denies unilateral numbness or tinglings in extremities.  No facial droop or slurred speech.  Patient denies chest pain, cough, shortness breath.  No nausea, vomiting, diarrhea or abdominal pain. He states that he has pain in both legs and feet.  He cannot walk due normally to pain.  He also complains of right-sided testicular pain ongoing for over a month.  He states that he has some mild dysuria, no hematuria.  Data reviewed independently and ED Course: pt was found to have WBC 10.1, alcohol level<10, positive D-dimer 0.61, GFR> 60, temperature normal, blood pressure 126/76, heart rate of 104, RR 25, oxygen saturation 99% on room air. Images negative for acute injury or bony fracture include negative CT head, negative CT of C-spine, bilateral feet and bilateral ankles.  CTA negative for PE.  Lower extremity venous Doppler negative for DVT.  Chest x-ray negative. Pt is placed on telemetry  US-Scrotum: Bilateral epididymal cysts and hydroceles.   Small left varicocele.   No acute abnormality to correspond with the given clinical history is noted.  EKG: I have personally reviewed.  Sinus rhythm, QTc 457, LAD.   Review of Systems:   General: no fevers, chills, no body weight gain, has fatigue HEENT: no blurry vision, hearing changes or sore  throat Respiratory: no dyspnea, coughing, wheezing CV: no chest pain, no palpitations GI: no nausea, vomiting, abdominal pain, diarrhea, constipation GU: no dysuria, burning on urination, increased urinary frequency, hematuria  Ext: no leg edema Neuro: no unilateral weakness, numbness, or tingling, no vision change or hearing loss. Has fall. Skin: no rash, no skin tear. MSK: No muscle spasm, no deformity, no limitation of range of movement in spin Heme: No easy bruising.  Travel history: No recent long distant travel.   Allergy:  Allergies  Allergen Reactions   Peanut Butter Flavor     Past Medical History:  Diagnosis Date   Alcohol abuse    Anxiety    Cocaine abuse (HCC) 05/27/2016   Depression    Seizures (HCC)     Past Surgical History:  Procedure Laterality Date   FINGER SURGERY      Social History:  reports that he has been smoking cigarettes and e-cigarettes. He has been smoking an average of 1 pack per day. He has never used smokeless tobacco. He reports current alcohol use of about 4.0 - 5.0 standard drinks of alcohol per week. He reports current drug use. Drugs: Marijuana and Cocaine.  Family History: History reviewed. No pertinent family history.  I have tried to review with patient about his family medical history, but patient states that he does not know the detailed information about his family medical history.  Prior to Admission medications   Not on File    Physical Exam: Vitals:   10/10/22 0021 10/10/22 0030 10/10/22 1100  10/10/22 1130  BP: 120/77 126/76 (!) 82/68 (!) 90/56  Pulse: (!) 101 (!) 104 68 70  Resp: (!) 21 (!) 25    Temp: 98.2 F (36.8 C)     TempSrc: Oral     SpO2: 97% 99% 94% 93%   General: Not in acute distress HEENT:       Eyes: PERRL, EOMI, no scleral icterus.       ENT: No discharge from the ears and nose, no pharynx injection, no tonsillar enlargement.        Neck: No JVD, no bruit, no mass felt. Heme: No neck lymph node  enlargement. Cardiac: S1/S2, RRR, No murmurs, No gallops or rubs. Respiratory: No rales, wheezing, rhonchi or rubs. GI: Soft, nondistended, nontender, no rebound pain, no organomegaly, BS present. GU: No hematuria Ext: No pitting leg edema bilaterally. 1+DP/PT pulse bilaterally. Musculoskeletal: has pain in both lower legs and feet.  Skin: No rashes.  Neuro: Alert, oriented X3, cranial nerves II-XII grossly intact, moves all extremities normally.  Psych: Patient is not psychotic, no suicidal or hemocidal ideation.  Labs on Admission: I have personally reviewed following labs and imaging studies  CBC: Recent Labs  Lab 10/07/22 2013 10/10/22 0020 10/10/22 0803  WBC 6.6 10.1 7.2  NEUTROABS 3.4 7.5  --   HGB 13.1 12.0* 11.0*  HCT 42.1 38.7* 35.9*  MCV 87.0 89.0 88.6  PLT 196 227 160   Basic Metabolic Panel: Recent Labs  Lab 10/07/22 2013 10/10/22 0020 10/10/22 0803  NA 133* 138  --   K 3.8 4.0  --   CL 99 102  --   CO2 24 26  --   GLUCOSE 79 121*  --   BUN 16 13  --   CREATININE 0.66 1.13 0.87  CALCIUM 8.6* 8.9  --   MG  --  2.4  --    GFR: Estimated Creatinine Clearance: 117.4 mL/min (by C-G formula based on SCr of 0.87 mg/dL). Liver Function Tests: Recent Labs  Lab 10/10/22 0020  AST 25  ALT 13  ALKPHOS 75  BILITOT 0.5  PROT 7.6  ALBUMIN 3.7   Recent Labs  Lab 10/10/22 0020  LIPASE 71*   No results for input(s): "AMMONIA" in the last 168 hours. Coagulation Profile: No results for input(s): "INR", "PROTIME" in the last 168 hours. Cardiac Enzymes: No results for input(s): "CKTOTAL", "CKMB", "CKMBINDEX", "TROPONINI" in the last 168 hours. BNP (last 3 results) No results for input(s): "PROBNP" in the last 8760 hours. HbA1C: No results for input(s): "HGBA1C" in the last 72 hours. CBG: No results for input(s): "GLUCAP" in the last 168 hours. Lipid Profile: No results for input(s): "CHOL", "HDL", "LDLCALC", "TRIG", "CHOLHDL", "LDLDIRECT" in the last 72  hours. Thyroid Function Tests: No results for input(s): "TSH", "T4TOTAL", "FREET4", "T3FREE", "THYROIDAB" in the last 72 hours. Anemia Panel: No results for input(s): "VITAMINB12", "FOLATE", "FERRITIN", "TIBC", "IRON", "RETICCTPCT" in the last 72 hours. Urine analysis:    Component Value Date/Time   COLORURINE YELLOW (A) 10/10/2022 0155   APPEARANCEUR CLEAR (A) 10/10/2022 0155   APPEARANCEUR Clear 07/24/2013 2209   LABSPEC 1.017 10/10/2022 0155   LABSPEC 1.010 07/24/2013 2209   PHURINE 5.0 10/10/2022 0155   GLUCOSEU NEGATIVE 10/10/2022 0155   GLUCOSEU Negative 07/24/2013 2209   HGBUR NEGATIVE 10/10/2022 0155   BILIRUBINUR NEGATIVE 10/10/2022 0155   BILIRUBINUR Negative 07/24/2013 2209   KETONESUR 5 (A) 10/10/2022 0155   PROTEINUR NEGATIVE 10/10/2022 0155   NITRITE NEGATIVE 10/10/2022 0155  LEUKOCYTESUR NEGATIVE 10/10/2022 0155   LEUKOCYTESUR Negative 07/24/2013 2209   Sepsis Labs: @LABRCNTIP (procalcitonin:4,lacticidven:4) ) Recent Results (from the past 240 hour(s))  Chlamydia/NGC rt PCR ([facility] only)     Status: None   Collection Time: 10/10/22  1:55 AM   Specimen: Urine, Clean Catch  Result Value Ref Range Status   Specimen source GC/Chlam URINE, RANDOM  Final   Chlamydia Tr NOT DETECTED NOT DETECTED Final   N gonorrhoeae NOT DETECTED NOT DETECTED Final    Comment: (NOTE) This CT/NG assay has not been evaluated in patients with a history of  hysterectomy. Performed at [facility], [address] [place], [place] [address]   Resp panel by RT-PCR (RSV, Flu A&B, Covid) Anterior Nasal Swab     Status: None   Collection Time: 10/10/22  1:55 AM   Specimen: Anterior Nasal Swab  Result Value Ref Range Status   SARS Coronavirus 2 by RT PCR NEGATIVE NEGATIVE Final    Comment: (NOTE) SARS-CoV-2 target nucleic acids are NOT DETECTED.  The SARS-CoV-2 RNA is generally detectable in upper respiratory specimens during the acute phase of infection. The  lowest concentration of SARS-CoV-2 viral copies this assay can detect is 138 copies/mL. A negative result does not preclude SARS-Cov-2 infection and should not be used as the sole basis for treatment or other patient management decisions. A negative result may occur with  improper specimen collection/handling, submission of specimen other than nasopharyngeal swab, presence of viral mutation(s) within the areas targeted by this assay, and inadequate number of viral copies(<138 copies/mL). A negative result must be combined with clinical observations, patient history, and epidemiological information. The expected result is Negative.  Fact Sheet for Patients:  BloggerCourse.comhttps://www.fda.gov/media/152166/download  Fact Sheet for Healthcare Providers:  SeriousBroker.ithttps://www.fda.gov/media/152162/download  This test is no t yet approved or cleared by the [place] FDA and  has been authorized for detection and/or diagnosis of SARS-CoV-2 by FDA under an Emergency Use Authorization (EUA). This EUA will remain  in effect (meaning this test can be used) for the duration of the COVID-19 declaration under Section 564(b)(1) of the Act, 21 U.S.C.section 360bbb-3(b)(1), unless the authorization is terminated  or revoked sooner.       Influenza A by PCR NEGATIVE NEGATIVE Final   Influenza B by PCR NEGATIVE NEGATIVE Final    Comment: (NOTE) The Xpert Xpress SARS-CoV-2/FLU/RSV plus assay is intended as an aid in the diagnosis of influenza from Nasopharyngeal swab specimens and should not be used as a sole basis for treatment. Nasal washings and aspirates are unacceptable for Xpert Xpress SARS-CoV-2/FLU/RSV testing.  Fact Sheet for Patients: BloggerCourse.comhttps://www.fda.gov/media/152166/download  Fact Sheet for Healthcare Providers: SeriousBroker.ithttps://www.fda.gov/media/152162/download  This test is not yet approved or cleared by the [place] FDA and has been authorized for detection and/or diagnosis of SARS-CoV-2 by FDA under  an Emergency Use Authorization (EUA). This EUA will remain in effect (meaning this test can be used) for the duration of the COVID-19 declaration under Section 564(b)(1) of the Act, 21 U.S.C. section 360bbb-3(b)(1), unless the authorization is terminated or revoked.     Resp Syncytial Virus by PCR NEGATIVE NEGATIVE Final    Comment: (NOTE) Fact Sheet for Patients: BloggerCourse.comhttps://www.fda.gov/media/152166/download  Fact Sheet for Healthcare Providers: SeriousBroker.ithttps://www.fda.gov/media/152162/download  This test is not yet approved or cleared by the [place] FDA and has been authorized for detection and/or diagnosis of SARS-CoV-2 by FDA under an Emergency Use Authorization (EUA). This EUA will remain in effect (meaning this test can be used) for the duration  of the COVID-19 declaration under Section 564(b)(1) of the Act, 21 U.S.C. section 360bbb-3(b)(1), unless the authorization is terminated or revoked.  Performed at [facility], [address] [place], [place] [address]      Radiological Exams on Admission: DG Ankle Complete Right  Result Date: 10/10/2022 CLINICAL DATA:  47 year old male status post fall with lower extremity pain. EXAM: RIGHT ANKLE - COMPLETE 3+ VIEW COMPARISON:  Right foot series today. Right ankle series 08/19/2017. FINDINGS: Chronic degenerative spurring at the calcaneus, talus, tibial plafond, and both malleoli. But mortise joint alignment is maintained. No ankle joint effusion is evident. No acute fracture or dislocation identified. No discrete soft tissue injury. 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 No acute fracture or dislocation identified. Electronically Signed   By: Odessa Fleming M.D.   On: 10/10/2022 05:28   DG Foot Complete Left  Result Date: 10/10/2022 CLINICAL DATA:  47 year old male with history of trauma from a fall complaining of lower extremity pain. EXAM: LEFT FOOT - COMPLETE 3+ VIEW; LEFT ANKLE  COMPLETE - 3+ VIEW COMPARISON:  Left ankle radiographs 11/09/2016. FINDINGS: Multiple views of the left foot and ankle demonstrate no definite acute displaced fracture or dislocation. Throughout the foot and ankle there are multifocal degenerative changes of osteoarthritis with joint space narrowing, subchondral sclerosis, subchondral cyst formation and osteophyte formation, most evident in the midfoot, hindfoot and at the tibiotalar joint. Small plantar calcaneal enthesophyte incidentally noted. IMPRESSION: 1. No acute radiographic abnormality of the left foot or ankle. 2. 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 3. Small plantar calcaneal enthesophyte. Electronically Signed   By: Trudie Reed M.D.   On: 10/10/2022 05:27   DG Ankle Complete Left  Result Date: 10/10/2022 CLINICAL DATA:  47 year old male with history of trauma from a fall complaining of lower extremity pain. EXAM: LEFT FOOT - COMPLETE 3+ VIEW; LEFT ANKLE COMPLETE - 3+ VIEW COMPARISON:  Left ankle radiographs 11/09/2016. FINDINGS: Multiple views of the left foot and ankle demonstrate no definite acute displaced fracture or dislocation. Throughout the foot and ankle there are multifocal degenerative changes of osteoarthritis with joint space narrowing, subchondral sclerosis, subchondral cyst formation and osteophyte formation, most evident in the midfoot, hindfoot and at the tibiotalar joint. Small plantar calcaneal enthesophyte incidentally noted. IMPRESSION: 1. No acute radiographic abnormality of the left foot or ankle. 2. 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 3. Small plantar calcaneal enthesophyte. Electronically Signed   By: Trudie Reed M.D.   On: 10/10/2022 05:27   DG Foot Complete Right  Result Date: 10/10/2022 CLINICAL DATA:  47 year old male status post fall with lower extremity pain. EXAM: RIGHT FOOT  COMPLETE - 3+ VIEW COMPARISON:  Right ankle series reported separately. FINDINGS: Three views of the right foot. Maintained alignment. Chronic degenerative spurring of the calcaneus and the anterior talus. Chronic deformity at the base of the 2nd middle phalanx. Chronic appearing deformity also of the 5th proximal phalanx. No acute fracture or dislocation identified. No discrete soft tissue injury. IMPRESSION: No acute fracture or dislocation identified about the right foot. Electronically Signed   By: Odessa Fleming M.D.   On: 10/10/2022 05:26   CT Angio Chest PE W and/or Wo Contrast  Result Date: 10/10/2022 CLINICAL DATA:  Positive D-dimer and chest pain, initial encounter EXAM: CT ANGIOGRAPHY CHEST WITH CONTRAST TECHNIQUE: Multidetector CT imaging of the chest was performed using the standard  protocol during bolus administration of intravenous contrast. Multiplanar CT image reconstructions and MIPs were obtained to evaluate the vascular anatomy. RADIATION DOSE REDUCTION: This exam was performed according to the departmental dose-optimization program which includes automated exposure control, adjustment of the mA and/or kV according to patient size and/or use of iterative reconstruction technique. CONTRAST:  34mL OMNIPAQUE IOHEXOL 350 MG/ML SOLN COMPARISON:  None Available. FINDINGS: Cardiovascular: Thoracic aorta and its branches are within normal limits. Mild atherosclerotic calcifications are seen. No aneurysmal dilatation or dissection is noted. Heart is not significantly enlarged in size. The pulmonary artery shows a normal branching pattern bilaterally. No filling defect to suggest pulmonary embolism is noted. Mediastinum/Nodes: Thoracic inlet is within normal limits. No hilar or mediastinal adenopathy is noted. The esophagus as visualized is within normal limits. Lungs/Pleura: The lungs are well aerated bilaterally. No focal infiltrate or sizable effusion is noted. No parenchymal nodules are seen. Upper  Abdomen: Visualized upper abdomen is within normal limits. Musculoskeletal: Degenerative changes of the thoracic spine are noted. No acute rib abnormality is seen. Review of the MIP images confirms the above findings. IMPRESSION: No evidence of pulmonary emboli. No acute abnormality seen. Aortic Atherosclerosis (ICD10-I70.0). Electronically Signed   By: Alcide Clever M.D.   On: 10/10/2022 03:48   US SCROTUM W/DOPPLER  Result Date: 10/10/2022 CLINICAL DATA:  Right-sided testicular pain EXAM: SCROTAL ULTRASOUND DOPPLER ULTRASOUND OF THE TESTICLES TECHNIQUE: Complete ultrasound examination of the testicles, epididymis, and other scrotal structures was performed. Color and spectral Doppler ultrasound were also utilized to evaluate blood flow to the testicles. COMPARISON:  None Available. FINDINGS: Right testicle Measurements: 4.6 x 2.6 x 3.4 cm. No mass or microlithiasis visualized. Scrotal pearl is noted on the right. Left testicle Measurements: 4.3 x 2.3 x 2.9 cm. No mass or microlithiasis visualized. Right epididymis:  3 mm cyst is noted. Left epididymis:  6 mm cyst is noted. Hydrocele:  Small bilateral hydroceles are seen. Varicocele:  Left-sided varicocele is noted. Pulsed Doppler interrogation of both testes demonstrates normal low resistance arterial and venous waveforms bilaterally. IMPRESSION: Bilateral epididymal cysts and hydroceles. Small left varicocele. No acute abnormality to correspond with the given clinical history is noted. Electronically Signed   By: Alcide Clever M.D.   On: 10/10/2022 02:13   DG Chest Portable 1 View  Result Date: 10/10/2022 CLINICAL DATA:  Cough and syncopal episode EXAM: PORTABLE CHEST 1 VIEW COMPARISON:  05/01/2021 FINDINGS: Cardiac shadow is within normal limits and stable. Lungs are clear bilaterally. No bony abnormality is noted. IMPRESSION: No active disease. Electronically Signed   By: Alcide Clever M.D.   On: 10/10/2022 02:11   US Venous Img Lower Bilateral  (DVT)  Result Date: 10/10/2022 CLINICAL DATA:  Bilateral lower extremity pain EXAM: BILATERAL LOWER EXTREMITY VENOUS DOPPLER ULTRASOUND TECHNIQUE: Gray-scale sonography with graded compression, as well as color Doppler and duplex ultrasound were performed to evaluate the lower extremity deep venous systems from the level of the common femoral vein and including the common femoral, femoral, profunda femoral, popliteal and calf veins including the posterior tibial, peroneal and gastrocnemius veins when visible. The superficial great saphenous vein was also interrogated. Spectral Doppler was utilized to evaluate flow at rest and with distal augmentation maneuvers in the common femoral, femoral and popliteal veins. COMPARISON:  None Available. FINDINGS: RIGHT LOWER EXTREMITY Common Femoral Vein: No evidence of thrombus. Normal compressibility, respiratory phasicity and response to augmentation. Saphenofemoral Junction: No evidence of thrombus. Normal compressibility and flow on color Doppler imaging. Profunda Femoral  Vein: No evidence of thrombus. Normal compressibility and flow on color Doppler imaging. Femoral Vein: No evidence of thrombus. Normal compressibility, respiratory phasicity and response to augmentation. Popliteal Vein: No evidence of thrombus. Normal compressibility, respiratory phasicity and response to augmentation. Calf Veins: No evidence of thrombus. Normal compressibility and flow on color Doppler imaging. Superficial Great Saphenous Vein: No evidence of thrombus. Normal compressibility. Venous Reflux:  None. Other Findings:  None. LEFT LOWER EXTREMITY Common Femoral Vein: No evidence of thrombus. Normal compressibility, respiratory phasicity and response to augmentation. Saphenofemoral Junction: No evidence of thrombus. Normal compressibility and flow on color Doppler imaging. Profunda Femoral Vein: No evidence of thrombus. Normal compressibility and flow on color Doppler imaging. Femoral Vein: No  evidence of thrombus. Normal compressibility, respiratory phasicity and response to augmentation. Popliteal Vein: No evidence of thrombus. Normal compressibility, respiratory phasicity and response to augmentation. Calf Veins: No evidence of thrombus. Normal compressibility and flow on color Doppler imaging. Superficial Great Saphenous Vein: No evidence of thrombus. Normal compressibility. Venous Reflux:  None. Other Findings:  None. IMPRESSION: No evidence of deep venous thrombosis in either lower extremity. Electronically Signed   By: Layla Maw M.D.   On: 10/10/2022 01:56   CT Cervical Spine Wo Contrast  Result Date: 10/10/2022 CLINICAL DATA:  Neck trauma. EXAM: CT CERVICAL SPINE WITHOUT CONTRAST TECHNIQUE: Multidetector CT imaging of the cervical spine was performed without intravenous contrast. Multiplanar CT image reconstructions were also generated. RADIATION DOSE REDUCTION: This exam was performed according to the departmental dose-optimization program which includes automated exposure control, adjustment of the mA and/or kV according to patient size and/or use of iterative reconstruction technique. COMPARISON:  01/18/2021 FINDINGS: Alignment: Normal. Skull base and vertebrae: No acute fracture. No primary bone lesion or focal pathologic process. Soft tissues and spinal canal: No prevertebral fluid or swelling. No visible canal hematoma. Disc levels: Degenerative changes identified with disc space narrowing and marginal osteophyte formation from C3-4 through C7-T1. Neural foraminal narrowing noted right worse than left at C4-5, bilaterally at C5-6 and C6-C7. Upper chest: Negative. IMPRESSION: Degenerative changes.  No acute osseous abnormalities. Electronically Signed   By: Layla Maw M.D.   On: 10/10/2022 01:00   CT HEAD WO CONTRAST ( )  Result Date: 10/10/2022 CLINICAL DATA:  Syncope, seizure. EXAM: CT HEAD WITHOUT CONTRAST TECHNIQUE: Contiguous axial images were obtained from the  base of the skull through the vertex without intravenous contrast. RADIATION DOSE REDUCTION: This exam was performed according to the departmental dose-optimization program which includes automated exposure control, adjustment of the mA and/or kV according to patient size and/or use of iterative reconstruction technique. COMPARISON:  05/29/2017 FINDINGS: Brain: No evidence of acute infarction, hemorrhage, hydrocephalus, extra-axial collection or mass lesion/mass effect. Vascular: No hyperdense vessel or unexpected calcification. Skull: Normal. Negative for fracture or focal lesion. Sinuses/Orbits: No acute finding. IMPRESSION: No acute intracranial process. Electronically Signed   By: Layla Maw M.D.   On: 10/10/2022 00:52      Assessment/Plan Principal Problem:   Syncope Active Problems:   Testicular pain   Cocaine abuse (HCC)   Depression with anxiety   Polysubstance abuse (HCC)   Tobacco use disorder   Alcohol abuse   Obesity (BMI 30-39.9)   Assessment and Plan:   Syncope: Etiology is not clear.  May be related to alcohol abuse and drug abuse.  Other differential diagnosis is broad, including vasovagal syncope, PE, seizure, TIA, orthostatic status, cardiac etiology..  No focal neurodeficit on physical examination. CTA negative for PE. CT-head  and neck negative for acute issues. No seizure activity.  - Place in tele bed for obs - Orthostatic vital signs  - 2d echo is ordered by EDP --> will f/u - Neuro checks  - IVF: 1L and then NS 100 cc/h  Testicular pain: Etiology is not clear.  Urinalysis negative.  GC chlamydia and gonorrhea negative. US-Scrotum negative for acute issues -Observe closely and pain control: As needed Percocet and Tylenol  Polysubstance abuse, tobacco, alcohol, cocaine abuse: -Did counseling about  the importance of quitting substance use -Nicotine patch -CiWA protocol  Depression with anxiety: not taking meds now -observe closely  Obesity (BMI  30-39.9): Body weight 102.1 kg, BMI 36.32 -Exercise and healthy diet -Encouraged losing weight    DVT ppx: SQ Lovenox  Code Status: Full code  Family Communication: not done, no family member is at bed side.     Disposition Plan:  Anticipate discharge back to previous environment  Consults called:  none  Admission status and Level of care: Telemetry Medical:    for obs   Dispo: The patient is from: Home              Anticipated d/c is to: Home              Anticipated d/c date is: 1 day              Patient currently is not medically stable to d/c.    Severity of Illness:  The appropriate patient status for this patient is OBSERVATION. Observation status is judged to be reasonable and necessary in order to provide the required intensity of service to ensure the patient's safety. The patient's presenting symptoms, physical exam findings, and initial radiographic and laboratory data in the context of their medical condition is felt to place them at decreased risk for further clinical deterioration. Furthermore, it is anticipated that the patient will be medically stable for discharge from the hospital within 2 midnights of admission.        Date of Service 10/10/2022    [facility]   If 7PM-7AM, please contact night-coverage [email or web address] 10/10/2022, 12:49 PM

## 2022-10-10 NOTE — ED Notes (Signed)
Ambulatory trial was attempted at this time. Pt was able to stand at all and cried out before his feet hit the floor.

## 2022-10-10 NOTE — ED Triage Notes (Signed)
Pt comes via [facility] for a syncopal episode, hx of seizures with no postictal state. Pt also c/o of bilateral knee pain and testicle pain for the past month.

## 2022-10-10 NOTE — ED Notes (Signed)
This nurse has contacted pharmacy to see if they can change the PO meds to IV at this time due to pt sleeping and is unable to take PO meds.

## 2022-10-10 NOTE — ED Notes (Signed)
PO challenge completed at this time. Pt drank 2 cups of water with no issues.

## 2022-10-11 ENCOUNTER — Encounter: Payer: Self-pay | Admitting: Internal Medicine

## 2022-10-11 ENCOUNTER — Observation Stay (HOSPITAL_BASED_OUTPATIENT_CLINIC_OR_DEPARTMENT_OTHER)
Admit: 2022-10-11 | Discharge: 2022-10-11 | Disposition: A | Discharge status: Home / Self Care | Payer: Self-pay | Attending: Internal Medicine | Admitting: Internal Medicine

## 2022-10-11 ENCOUNTER — Other Ambulatory Visit: Payer: Self-pay

## 2022-10-11 DIAGNOSIS — R55 Syncope and collapse: Secondary | ICD-10-CM

## 2022-10-11 DIAGNOSIS — F1910 Other psychoactive substance abuse, uncomplicated: Secondary | ICD-10-CM

## 2022-10-11 LAB — ECHOCARDIOGRAM COMPLETE
AR max vel: 2.92 cm2
AV Area VTI: 2.64 cm2
AV Area mean vel: 2.62 cm2
AV Mean grad: 4 mmHg
AV Peak grad: 6.8 mmHg
Ao pk vel: 1.3 m/s
Area-P 1/2: 3.08 cm2
MV VTI: 1.94 cm2
S' Lateral: 3 cm

## 2022-10-11 LAB — CBG MONITORING, ED: Glucose-Capillary: 98 mg/dL (ref 70–99)

## 2022-10-11 LAB — HIV ANTIBODY (ROUTINE TESTING W REFLEX): HIV Screen 4th Generation wRfx: NONREACTIVE

## 2022-10-11 NOTE — Progress Notes (Signed)
*  PRELIMINARY RESULTS* Echocardiogram 2D Echocardiogram has been performed.  Alex [name] 10/11/2022, 10:29 AM

## 2022-10-11 NOTE — ED Notes (Addendum)
Pt is refusing to comply with MD orders and requesting to leave AMA. Legal Guardian notified and did request that patient stay and receive all the medical attention offered by hospital staff. MD notified.  Assigned Attending did inform this RN that he will come reassess face to face. After communications with pt Attending MD did inform he will contact [place], [place].  Will continue plan of care.

## 2022-10-11 NOTE — ED Notes (Signed)
Called to patient's room r/t patient wanting to sign out AMA.  Dr. Algis Liming at bedside discussing with patient risks of signing out AMA.  Dr. Algis Liming also spoke with patient's mother and legal guardian, Allen Norris.  Patient's mother upset r/t patient leaving hospital.  Patient AAOx3.  Reasonable and clearly stating that he takes responsibility for his decision to sign out AMA.  IV discontinued and patient signed AMA paperwork.  Walked independently with easy and steady gait out of the ED.

## 2022-10-11 NOTE — ED Notes (Signed)
Pt discharge focused he did disconnect the monitoring device and threatened to leave AMA. Pt requesting to leave with cab voucher because of ambulating limitations.  This RN informed pt of importance of compliance with treatment plan and necessity of safe discharge planning that was all discussed in detail with patient per MD. MD notified. Will continue plan of care

## 2022-10-11 NOTE — Progress Notes (Signed)
PROGRESS NOTE   Alex [name]  XNA:[id number]    DOB: Jun 01, 1976    DOA: 10/10/2022  PCP: Dortha Kern, MD   I have briefly reviewed patients previous medical records in [facility].  Chief Complaint  Patient presents with   Loss of Consciousness    Brief Narrative:  47 year old male, lives in a motel, independent, medical history significant for polysubstance abuse (tobacco, alcohol, cocaine and THC), depression and anxiety, ADHD, obesity, prior history of syncope approximately 7 months ago, documented seizure, presented to the ED on 10/10/2022 with an episode of syncope.  He was standing outside smoking a cigarette when he reports that his legs gave away and he passed out for an undetermined amount of time with injury to bilateral feet and right knee.  No premonitory symptoms.  Reportedly no witnessed seizure-like activity and per EMS was not postictal.  No strokelike symptoms.  Also with chronic right sided testicular pain.   Assessment & Plan:  Principal Problem:   Syncope Active Problems:   Testicular pain   Cocaine abuse (HCC)   Depression with anxiety   Polysubstance abuse (HCC)   Tobacco use disorder   Alcohol abuse   Obesity (BMI 30-39.9)   Syncope and collapse: Unclear etiology.  This appears to be a second episode in the last 7 months per his report.  Orthostatic vital signs negative.  CT head and C-spine without acute findings.  CTA chest: No PE or acute findings.  Unfortunately not on telemetry, requested RN that he be placed on telemetry as soon as possible.  Check 2D echo.  Although low index of suspicion for seizures, given history of seizures we will check an Spot EEG.  Patient reportedly has ambulated in the ED without difficulty.  If above workup is negative, possible DC home later this afternoon or tomorrow.  Patient counseled that he should not drive x 6 months but indicates that he does not drive to begin with.  Troponin x 2: Negative.  Polysubstance abuse  (tobacco, alcohol, THC and cocaine) Patient indicates that he actually did alcohol and cocaine prior to his syncopal episode on day of admission.  Cocaine may have been the precipitating factor.  Reports only drinking 240 ounce beers a couple times a week.  Strongly counseled regarding cessation.  Continue nicotine patch and CIWA protocol.  UDS positive for cocaine, cannabinoid and TCAs.  BAL <10.  Chronic right-sided testicular pain: Scrotal ultrasound without acute findings.  UA, GC, chlamydia and gonorrhea testing negative.  No reported trauma.  Outpatient follow-up.  Anxiety and depression: Not on meds PTA.  Outpatient follow-up.  Anemia: No acute bleeding.  Suspect dilutional from IV fluids.  Outpatient follow-up.  There is no height or weight on file to calculate BMI.   DVT prophylaxis:   Initiated Lovenox.   Code Status: Full Code:  Family Communication: None at bedside. Disposition:  Status is: Observation The patient remains OBS appropriate and will d/c before 2 midnights.  Return to prior living/motel upon discharge.  May need transportation assistance from [facility].     Consultants:   None  Procedures:     Antimicrobials:      Subjective:  Seen this morning in the ED along with patient's RN in the room.  No new complaints.  Some bilateral feet pain but states that he has ambulated multiple times in the ED.  Denies any premonitory symptoms.  Rest of history as noted above.  Objective:   Vitals:   10/11/22 0200  10/11/22 0700 10/11/22 0800 10/11/22 0816  BP: 102/71 120/65 (!) 140/92 (!) 141/99  Pulse:  62 63 77  Resp:  18  19  Temp:  98.2 F (36.8 C)  98.2 F (36.8 C)  TempSrc:  Oral  Oral  SpO2:  96% 97% 95%    General exam: Young male, moderately built and nourished lying comfortably propped up in bed without distress. Respiratory system: Clear to auscultation. Respiratory effort normal. Cardiovascular system: S1 & S2 heard, RRR. No JVD, murmurs, rubs,  gallops or clicks. No pedal edema.  Was not on telemetry, requested ED RN that he be placed on telemetry ASAP. Gastrointestinal system: Abdomen is nondistended, soft and nontender. No organomegaly or masses felt. Normal bowel sounds heard. Central nervous system: Alert and oriented. No focal neurological deficits. Extremities: Symmetric 5 x 5 power.  Bilateral ankle and feet with some painful range of movements.  Minimal superficial abrasion on the inferior medial aspect of right knee Skin: No rashes, lesions or ulcers Psychiatry: Judgement and insight appear normal. Mood & affect appropriate.     Data Reviewed:   I have personally reviewed following labs and imaging studies   CBC: Recent Labs  Lab 10/07/22 2013 10/10/22 0020 10/10/22 0803  WBC 6.6 10.1 7.2  NEUTROABS 3.4 7.5  --   HGB 13.1 12.0* 11.0*  HCT 42.1 38.7* 35.9*  MCV 87.0 89.0 88.6  PLT 196 227 189    Basic Metabolic Panel: Recent Labs  Lab 10/07/22 2013 10/10/22 0020 10/10/22 0803  NA 133* 138  --   K 3.8 4.0  --   CL 99 102  --   CO2 24 26  --   GLUCOSE 79 121*  --   BUN 16 13  --   CREATININE 0.66 1.13 0.87  CALCIUM 8.6* 8.9  --   MG  --  2.4  --     Liver Function Tests: Recent Labs  Lab 10/10/22 0020  AST 25  ALT 13  ALKPHOS 75  BILITOT 0.5  PROT 7.6  ALBUMIN 3.7    CBG: Recent Labs  Lab 10/10/22 1617 10/11/22 0551  GLUCAP 111* 98    Microbiology Studies:   Recent Results (from the past 240 hour(s))  Chlamydia/NGC rt PCR ([facility] only)     Status: None   Collection Time: 10/10/22  1:55 AM   Specimen: Urine, Clean Catch  Result Value Ref Range Status   Specimen source GC/Chlam URINE, RANDOM  Final   Chlamydia Tr NOT DETECTED NOT DETECTED Final   N gonorrhoeae NOT DETECTED NOT DETECTED Final    Comment: (NOTE) This CT/NG assay has not been evaluated in patients with a history of  hysterectomy. Performed at [facility], [address] [place], [place] [address]    Resp panel by RT-PCR (RSV, Flu A&B, Covid) Anterior Nasal Swab     Status: None   Collection Time: 10/10/22  1:55 AM   Specimen: Anterior Nasal Swab  Result Value Ref Range Status   SARS Coronavirus 2 by RT PCR NEGATIVE NEGATIVE Final    Comment: (NOTE) SARS-CoV-2 target nucleic acids are NOT DETECTED.  The SARS-CoV-2 RNA is generally detectable in upper respiratory specimens during the acute phase of infection. The lowest concentration of SARS-CoV-2 viral copies this assay can detect is 138 copies/mL. A negative result does not preclude SARS-Cov-2 infection and should not be used as the sole basis for treatment or other patient management decisions. A negative result may occur with  improper  specimen collection/handling, submission of specimen other than nasopharyngeal swab, presence of viral mutation(s) within the areas targeted by this assay, and inadequate number of viral copies(<138 copies/mL). A negative result must be combined with clinical observations, patient history, and epidemiological information. The expected result is Negative.  Fact Sheet for Patients:  BloggerCourse.com  Fact Sheet for Healthcare Providers:  SeriousBroker.it  This test is no t yet approved or cleared by the [place] FDA and  has been authorized for detection and/or diagnosis of SARS-CoV-2 by FDA under an Emergency Use Authorization (EUA). This EUA will remain  in effect (meaning this test can be used) for the duration of the COVID-19 declaration under Section 564(b)(1) of the Act, 21 U.S.C.section 360bbb-3(b)(1), unless the authorization is terminated  or revoked sooner.       Influenza A by PCR NEGATIVE NEGATIVE Final   Influenza B by PCR NEGATIVE NEGATIVE Final    Comment: (NOTE) The Xpert Xpress SARS-CoV-2/FLU/RSV plus assay is intended as an aid in the diagnosis of influenza from Nasopharyngeal swab specimens and should not be used  as a sole basis for treatment. Nasal washings and aspirates are unacceptable for Xpert Xpress SARS-CoV-2/FLU/RSV testing.  Fact Sheet for Patients: BloggerCourse.com  Fact Sheet for Healthcare Providers: SeriousBroker.it  This test is not yet approved or cleared by the [place] FDA and has been authorized for detection and/or diagnosis of SARS-CoV-2 by FDA under an Emergency Use Authorization (EUA). This EUA will remain in effect (meaning this test can be used) for the duration of the COVID-19 declaration under Section 564(b)(1) of the Act, 21 U.S.C. section 360bbb-3(b)(1), unless the authorization is terminated or revoked.     Resp Syncytial Virus by PCR NEGATIVE NEGATIVE Final    Comment: (NOTE) Fact Sheet for Patients: BloggerCourse.com  Fact Sheet for Healthcare Providers: SeriousBroker.it  This test is not yet approved or cleared by the [place] FDA and has been authorized for detection and/or diagnosis of SARS-CoV-2 by FDA under an Emergency Use Authorization (EUA). This EUA will remain in effect (meaning this test can be used) for the duration of the COVID-19 declaration under Section 564(b)(1) of the Act, 21 U.S.C. section 360bbb-3(b)(1), unless the authorization is terminated or revoked.  Performed at [facility], [address] [place], [place] [address]     Radiology Studies:  DG Ankle Complete Right  Result Date: 10/10/2022 CLINICAL DATA:  47 year old male status post fall with lower extremity pain. EXAM: RIGHT ANKLE - COMPLETE 3+ VIEW COMPARISON:  Right foot series today. Right ankle series 08/19/2017. FINDINGS: Chronic degenerative spurring at the calcaneus, talus, tibial plafond, and both malleoli. But mortise joint alignment is maintained. No ankle joint effusion is evident. No acute fracture or dislocation identified. No discrete soft  tissue injury. 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 No acute fracture or dislocation identified. Electronically Signed   By: Odessa Fleming M.D.   On: 10/10/2022 05:28   DG Foot Complete Left  Result Date: 10/10/2022 CLINICAL DATA:  47 year old male with history of trauma from a fall complaining of lower extremity pain. EXAM: LEFT FOOT - COMPLETE 3+ VIEW; LEFT ANKLE COMPLETE - 3+ VIEW COMPARISON:  Left ankle radiographs 11/09/2016. FINDINGS: Multiple views of the left foot and ankle demonstrate no definite acute displaced fracture or dislocation. Throughout the foot and ankle there are multifocal degenerative changes of osteoarthritis with joint space narrowing, subchondral sclerosis, subchondral cyst formation and osteophyte formation, most evident in the midfoot, hindfoot and at  the tibiotalar joint. Small plantar calcaneal enthesophyte incidentally noted. IMPRESSION: 1. No acute radiographic abnormality of the left foot or ankle. 2. 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 3. Small plantar calcaneal enthesophyte. Electronically Signed   By: Vinnie Langton M.D.   On: 10/10/2022 05:27   DG Ankle Complete Left  Result Date: 10/10/2022 CLINICAL DATA:  47 year old male with history of trauma from a fall complaining of lower extremity pain. EXAM: LEFT FOOT - COMPLETE 3+ VIEW; LEFT ANKLE COMPLETE - 3+ VIEW COMPARISON:  Left ankle radiographs 11/09/2016. FINDINGS: Multiple views of the left foot and ankle demonstrate no definite acute displaced fracture or dislocation. Throughout the foot and ankle there are multifocal degenerative changes of osteoarthritis with joint space narrowing, subchondral sclerosis, subchondral cyst formation and osteophyte formation, most evident in the midfoot, hindfoot and at the tibiotalar joint. Small plantar calcaneal enthesophyte incidentally noted. IMPRESSION: 1. No acute  radiographic abnormality of the left foot or ankle. 2. 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 3. Small plantar calcaneal enthesophyte. Electronically Signed   By: Vinnie Langton M.D.   On: 10/10/2022 05:27   DG Foot Complete Right  Result Date: 10/10/2022 CLINICAL DATA:  47 year old male status post fall with lower extremity pain. EXAM: RIGHT FOOT COMPLETE - 3+ VIEW COMPARISON:  Right ankle series reported separately. FINDINGS: Three views of the right foot. Maintained alignment. Chronic degenerative spurring of the calcaneus and the anterior talus. Chronic deformity at the base of the 2nd middle phalanx. Chronic appearing deformity also of the 5th proximal phalanx. No acute fracture or dislocation identified. No discrete soft tissue injury. IMPRESSION: No acute fracture or dislocation identified about the right foot. Electronically Signed   By: Genevie Ann M.D.   On: 10/10/2022 05:26   CT Angio Chest PE W and/or Wo Contrast  Result Date: 10/10/2022 CLINICAL DATA:  Positive D-dimer and chest pain, initial encounter EXAM: CT ANGIOGRAPHY CHEST WITH CONTRAST TECHNIQUE: Multidetector CT imaging of the chest was performed using the standard protocol during bolus administration of intravenous contrast. Multiplanar CT image reconstructions and MIPs were obtained to evaluate the vascular anatomy. RADIATION DOSE REDUCTION: This exam was performed according to the departmental dose-optimization program which includes automated exposure control, adjustment of the mA and/or kV according to patient size and/or use of iterative reconstruction technique. CONTRAST:  53mL OMNIPAQUE IOHEXOL 350 MG/ML SOLN COMPARISON:  None Available. FINDINGS: Cardiovascular: Thoracic aorta and its branches are within normal limits. Mild atherosclerotic calcifications are seen. No aneurysmal dilatation or dissection is noted. Heart is not significantly enlarged in size. The pulmonary  artery shows a normal branching pattern bilaterally. No filling defect to suggest pulmonary embolism is noted. Mediastinum/Nodes: Thoracic inlet is within normal limits. No hilar or mediastinal adenopathy is noted. The esophagus as visualized is within normal limits. Lungs/Pleura: The lungs are well aerated bilaterally. No focal infiltrate or sizable effusion is noted. No parenchymal nodules are seen. Upper Abdomen: Visualized upper abdomen is within normal limits. Musculoskeletal: Degenerative changes of the thoracic spine are noted. No acute rib abnormality is seen. Review of the MIP images confirms the above findings. IMPRESSION: No evidence of pulmonary emboli. No acute abnormality seen. Aortic Atherosclerosis (ICD10-I70.0). Electronically Signed   By: Inez Catalina M.D.   On: 10/10/2022 03:48   US SCROTUM W/DOPPLER  Result Date: 10/10/2022 CLINICAL DATA:  Right-sided testicular pain EXAM: SCROTAL ULTRASOUND DOPPLER ULTRASOUND OF THE TESTICLES TECHNIQUE: Complete  ultrasound examination of the testicles, epididymis, and other scrotal structures was performed. Color and spectral Doppler ultrasound were also utilized to evaluate blood flow to the testicles. COMPARISON:  None Available. FINDINGS: Right testicle Measurements: 4.6 x 2.6 x 3.4 cm. No mass or microlithiasis visualized. Scrotal pearl is noted on the right. Left testicle Measurements: 4.3 x 2.3 x 2.9 cm. No mass or microlithiasis visualized. Right epididymis:  3 mm cyst is noted. Left epididymis:  6 mm cyst is noted. Hydrocele:  Small bilateral hydroceles are seen. Varicocele:  Left-sided varicocele is noted. Pulsed Doppler interrogation of both testes demonstrates normal low resistance arterial and venous waveforms bilaterally. IMPRESSION: Bilateral epididymal cysts and hydroceles. Small left varicocele. No acute abnormality to correspond with the given clinical history is noted. Electronically Signed   By: Alcide CleverMark  Lukens M.D.   On: 10/10/2022 02:13    DG Chest Portable 1 View  Result Date: 10/10/2022 CLINICAL DATA:  Cough and syncopal episode EXAM: PORTABLE CHEST 1 VIEW COMPARISON:  05/01/2021 FINDINGS: Cardiac shadow is within normal limits and stable. Lungs are clear bilaterally. No bony abnormality is noted. IMPRESSION: No active disease. Electronically Signed   By: Alcide CleverMark  Lukens M.D.   On: 10/10/2022 02:11   US Venous Img Lower Bilateral (DVT)  Result Date: 10/10/2022 CLINICAL DATA:  Bilateral lower extremity pain EXAM: BILATERAL LOWER EXTREMITY VENOUS DOPPLER ULTRASOUND TECHNIQUE: Gray-scale sonography with graded compression, as well as color Doppler and duplex ultrasound were performed to evaluate the lower extremity deep venous systems from the level of the common femoral vein and including the common femoral, femoral, profunda femoral, popliteal and calf veins including the posterior tibial, peroneal and gastrocnemius veins when visible. The superficial great saphenous vein was also interrogated. Spectral Doppler was utilized to evaluate flow at rest and with distal augmentation maneuvers in the common femoral, femoral and popliteal veins. COMPARISON:  None Available. FINDINGS: RIGHT LOWER EXTREMITY Common Femoral Vein: No evidence of thrombus. Normal compressibility, respiratory phasicity and response to augmentation. Saphenofemoral Junction: No evidence of thrombus. Normal compressibility and flow on color Doppler imaging. Profunda Femoral Vein: No evidence of thrombus. Normal compressibility and flow on color Doppler imaging. Femoral Vein: No evidence of thrombus. Normal compressibility, respiratory phasicity and response to augmentation. Popliteal Vein: No evidence of thrombus. Normal compressibility, respiratory phasicity and response to augmentation. Calf Veins: No evidence of thrombus. Normal compressibility and flow on color Doppler imaging. Superficial Great Saphenous Vein: No evidence of thrombus. Normal compressibility. Venous Reflux:   None. Other Findings:  None. LEFT LOWER EXTREMITY Common Femoral Vein: No evidence of thrombus. Normal compressibility, respiratory phasicity and response to augmentation. Saphenofemoral Junction: No evidence of thrombus. Normal compressibility and flow on color Doppler imaging. Profunda Femoral Vein: No evidence of thrombus. Normal compressibility and flow on color Doppler imaging. Femoral Vein: No evidence of thrombus. Normal compressibility, respiratory phasicity and response to augmentation. Popliteal Vein: No evidence of thrombus. Normal compressibility, respiratory phasicity and response to augmentation. Calf Veins: No evidence of thrombus. Normal compressibility and flow on color Doppler imaging. Superficial Great Saphenous Vein: No evidence of thrombus. Normal compressibility. Venous Reflux:  None. Other Findings:  None. IMPRESSION: No evidence of deep venous thrombosis in either lower extremity. Electronically Signed   By: Layla MawJoshua  Pleasure M.D.   On: 10/10/2022 01:56   CT Cervical Spine Wo Contrast  Result Date: 10/10/2022 CLINICAL DATA:  Neck trauma. EXAM: CT CERVICAL SPINE WITHOUT CONTRAST TECHNIQUE: Multidetector CT imaging of the cervical spine was performed without intravenous contrast. Multiplanar  CT image reconstructions were also generated. RADIATION DOSE REDUCTION: This exam was performed according to the departmental dose-optimization program which includes automated exposure control, adjustment of the mA and/or kV according to patient size and/or use of iterative reconstruction technique. COMPARISON:  01/18/2021 FINDINGS: Alignment: Normal. Skull base and vertebrae: No acute fracture. No primary bone lesion or focal pathologic process. Soft tissues and spinal canal: No prevertebral fluid or swelling. No visible canal hematoma. Disc levels: Degenerative changes identified with disc space narrowing and marginal osteophyte formation from C3-4 through C7-T1. Neural foraminal narrowing noted  right worse than left at C4-5, bilaterally at C5-6 and C6-C7. Upper chest: Negative. IMPRESSION: Degenerative changes.  No acute osseous abnormalities. Electronically Signed   By: Sammie Bench M.D.   On: 10/10/2022 01:00   CT HEAD WO CONTRAST (5MM)  Result Date: 10/10/2022 CLINICAL DATA:  Syncope, seizure. EXAM: CT HEAD WITHOUT CONTRAST TECHNIQUE: Contiguous axial images were obtained from the base of the skull through the vertex without intravenous contrast. RADIATION DOSE REDUCTION: This exam was performed according to the departmental dose-optimization program which includes automated exposure control, adjustment of the mA and/or kV according to patient size and/or use of iterative reconstruction technique. COMPARISON:  05/29/2017 FINDINGS: Brain: No evidence of acute infarction, hemorrhage, hydrocephalus, extra-axial collection or mass lesion/mass effect. Vascular: No hyperdense vessel or unexpected calcification. Skull: Normal. Negative for fracture or focal lesion. Sinuses/Orbits: No acute finding. IMPRESSION: No acute intracranial process. Electronically Signed   By: Sammie Bench M.D.   On: 10/10/2022 00:52    Scheduled Meds:    folic acid  1 mg Oral Daily   folic acid  1 mg Intravenous Daily   LORazepam  0-4 mg Oral Q6H   Followed by   Derrill Memo ON 10/12/2022] LORazepam  0-4 mg Oral Q12H   multivitamin with minerals  1 tablet Oral Daily   nicotine  21 mg Transdermal Daily   thiamine  100 mg Oral Daily   Or   thiamine  100 mg Intravenous Daily    Continuous Infusions:    sodium chloride 100 mL/hr at 10/10/22 2235     LOS: 0 days     Vernell Leep, MD,  FACP, [facility], [facility], [facility], [place]     To contact the attending provider between 7A-7P or the covering provider during after hours 7P-7A, please log into the web site [email or web address] and access using universal [place] password for that web site. If you do not have  the password, please call the hospital operator.  10/11/2022, 8:54 AM

## 2022-10-11 NOTE — ED Notes (Signed)
Attending at bedside explaining need for hospitalization.

## 2022-10-11 NOTE — ED Notes (Signed)
Pt did leave AMA after speaking with Attending, Chg RN and This RN about detailed explanation of medical necessity for hospitalization.

## 2022-10-11 NOTE — TOC Initial Note (Signed)
Transition of Care [facility]) - Initial/Assessment Note    Patient Details  Name: Alex [name] MRN: [id number] Date of Birth: 11/02/1975  Transition [facility] CM/SW Contact:    Shelbie Hutching, RN Phone Number: 10/11/2022, 12:26 PM  Clinical Narrative:                 [facility] consult for substance abuse resources and cab voucher.  RNCM met with patient at the bedside in the ED.  Patient is ready to be discharged he would like to get back to the [employer] in [place].  He says that his brother is staying with him.  Patient reports that he has been doing cocaine for a long time he used to do meth.  He does not believe that is the reason he passed out.  Physician and ER RN explained to patient that he should remain in the hospital until all his testing returns.  Provided patient with substance abuse resources.  Patient reports that he is familiar with all the resources in [place].  He says he probably should quit the Cocaine but to do that he needs to get away from his brother.    Patient requests a ride to the [employer] [place].  RNCM provided a Cab Voucher for [employer] for when patient is discharged.    Expected Discharge Plan: Home/Self Care Barriers to Discharge: Continued Medical Work up   Patient Goals and CMS Choice Patient states their goals for this hospitalization and ongoing recovery are:: Patient is ready to go          Expected Discharge Plan and Services   Discharge Planning Services: CM Consult   Living arrangements for the past 2 months: Hotel/Motel                 DME Arranged: N/A DME Agency: NA       HH Arranged: NA [place] Agency: NA        Prior Living Arrangements/Services Living arrangements for the past 2 months: Hotel/Motel Lives with:: Self Patient language and need for interpreter reviewed:: Yes Do you feel safe going back to the place where you live?: Yes      Need for Family Participation in Patient Care: Yes (Comment) Care giver  support system in place?: No (comment)   Criminal Activity/Legal Involvement Pertinent to Current Situation/Hospitalization: No - Comment as needed  Activities of Daily Living Home Assistive Devices/Equipment: None ADL Screening (condition at time of admission) Patient's cognitive ability adequate to safely complete daily activities?: Yes Is the patient deaf or have difficulty hearing?: No Does the patient have difficulty seeing, even when wearing glasses/contacts?: No Does the patient have difficulty concentrating, remembering, or making decisions?: No Patient able to express need for assistance with ADLs?: Yes Does the patient have difficulty dressing or bathing?: No Independently performs ADLs?: Yes (appropriate for developmental age) Does the patient have difficulty walking or climbing stairs?: No Weakness of Legs: None Weakness of Arms/Hands: None  Permission Sought/Granted   Permission granted to share information with : No              Emotional Assessment Appearance:: Appears stated age Attitude/Demeanor/Rapport: Engaged, Self-Confident, Charismatic Affect (typically observed): Accepting, Happy Orientation: : Oriented to Self, Oriented to Place, Oriented to  Time, Oriented to Situation Alcohol / Substance Use: Illicit Drugs Psych Involvement: No (comment)  Admission diagnosis:  Syncope [R55] Patient Active Problem List   Diagnosis Date Noted   Syncope 10/10/2022  Depression with anxiety 10/10/2022   Testicular pain 10/10/2022   Obesity (BMI 30-39.9) 10/10/2022   Polysubstance abuse ([place]) 10/10/2022   Alcohol abuse 10/10/2022   Substance induced mood disorder ([place]) 12/30/2016   Moderate recurrent major depression ([place]) 12/30/2016   Cocaine abuse ([place])    ADHD 08/11/2016   Alcohol dependence with unspecified alcohol-induced disorder ([place]) 06/28/2016   Alcohol withdrawal ([place]) 06/28/2016   Cocaine use disorder, moderate, dependence ([place]) 06/28/2016    Unspecified Depressive disorder 06/28/2016   Tobacco use disorder 06/28/2016   PCP:  Lynnell Jude, MD Pharmacy:   [facility] [address] [place] [address] Phone: [phone] Fax: [phone]  [place], [place] - [address] [place] [address] Phone: [phone] Fax: [place], [place] - [address] [place] [address] Phone: [phone] Fax: [phone]     Social Determinants of Health (SDOH) Social History: SDOH Screenings   Alcohol Screen: Medium Risk (07/31/2017)  Tobacco Use: High Risk (10/11/2022)   SDOH Interventions:     Readmission Risk Interventions     No data to display

## 2022-10-11 NOTE — Discharge Summary (Signed)
Physician Discharge Summary  Alex [name] ZOX:[id number] DOB: 1976/01/17  PCP: Dortha Kern, MD  Patient left the hospital AGAINST MEDICAL ADVICE.  Admitted from: Home Discharged to: [facility].  Likely returning home  Admit date: 10/10/2022 Discharge date: 10/11/2022  Recommendations for Outpatient Follow-up:  Patient and patient's mother (via phone) were both that patient should seek medical attention immediately if there was any worsening of his medical condition.    Home Health: N/A    Equipment/Devices: N/A    Discharge Condition: Guarded   Code Status: Full Code Diet recommendation: Left AMA    Discharge Diagnoses:  Principal Problem:   Syncope Active Problems:   Testicular pain   Cocaine abuse (HCC)   Depression with anxiety   Polysubstance abuse (HCC)   Tobacco use disorder   Alcohol abuse   Obesity (BMI 30-39.9)   Brief Summary: 47 year old male, lives in a motel, independent, medical history significant for polysubstance abuse (tobacco, alcohol, cocaine and THC), depression and anxiety, ADHD, obesity, prior history of syncope approximately 7 months ago, documented seizure, presented to the ED on 10/10/2022 with an episode of syncope.  He was standing outside smoking a cigarette when he reports that his legs gave away and he passed out for an undetermined amount of time with injury to bilateral feet and right knee.  No premonitory symptoms (specifically asked and he denied chest pain, dyspnea, palpitations, lightheadedness, strokelike symptoms).  Reportedly no witnessed seizure-like activity and per EMS was not postictal.  No strokelike symptoms.  Also with chronic right sided testicular pain.  Patient was admitted for syncope evaluation.  I had seen him early this morning.  Since then I received multiple communications from 3 ED nurses asking if patient was discharging today.  I advised them that we were waiting for evaluation to complete including 2D echo and EEG.   Pending review of those results, review of telemetry and evaluation of clinical stability, there was a possibility of him being discharged later today.  Echo showed mild low EF of 45-50%.  I consulted cardiology to evaluate.  EEG technician was about to come in and perform an EEG after completing LTM EEG on a different patient.  Patient's RN reported that since 9 AM this morning, patient has been insisting on discharging home, threatening multiple times to leave AMA.  RN had notified patient's mother/legal guardian who requested that the patient stay and receive all the medical attention offered by hospital staff.  Later this afternoon, I proceeded to patient's room and reassessed patient along with his ED RN.  Patient had already dressed up and was demanding on leaving.  I had a detailed discussion with him.  I advised him in no uncertain terms that his medical evaluation and related management was not yet complete.  I warned him that if he left the hospital prematurely, there was high risk of recurrence of his medical condition, worsening medical condition and even death.  I strongly recommended that he remain in the hospital to complete evaluation following which I was happy to discharge him home in a safe and stable condition.  Despite all of this, patient insisted on going home.  He stated that he was well aware of the risks that I had stated and reiterated them back to me.  He then went on to inform me that after he passed out, he was down for only a few seconds, got up and went into his house (motel apartment), sat down and was watching TV when he  started having feet pain is why he activated EMS.  He told us that he was well aware of how to seek medical attention and even had a form of a life alert at home.  With his permission, I proceeded to call and speak with his mother/legal guardian who appropriately was upset that he was leaving without completion of medical evaluation and management and wanted him  to stay.  I advised her that the patient has capacity (I.e. competent) to make his own medical decisions but is making poor judgment and we are unable to hold him involuntarily against his wishes.  She verbalized understanding and indicated that she may have to call the ambulance and send him back to the ED again.  I advised her that he should seek immediate medical attention if there was any decline in his condition.  I updated patient's ED RN and even ED charge nurse in person.  ED charge RN had apparently recommended involuntary commitment for this patient.  However on careful review, as stated above, the patient had medical decision-making capacity and was choosing to leave on his own volition despite being aware of the risks and believe that he is making poor judgment.  Moreover he was not at risk of hurting himself or anyone else.  To confirm, I even reached out to the [facility] Director on-call Dr. Delfino Lovett, discussed the case in detail with him and he was in agreement that patient had the rights to sign out AMA.  Patient then left AMA.  Rest of evaluation and management as was planned but unable to be fully completed as as in the note below from earlier this morning..     Assessment & Plan:   Syncope and collapse: Unclear etiology.  This appears to be a second episode in the last 7 months per his report.  Orthostatic vital signs negative.  CT head and C-spine without acute findings.  CTA chest: No PE or acute findings.  Unfortunately not on telemetry, requested RN that he be placed on telemetry as soon as possible.  Check 2D echo.  Although low index of suspicion for seizures, given history of seizures we will check an Spot EEG.  Patient reportedly has ambulated in the ED without difficulty.  If above workup is negative, possible DC home later this afternoon or tomorrow.  Patient counseled that he should not drive x 6 months but indicates that he does not drive to begin with.  Troponin x 2: Negative.    Polysubstance abuse (tobacco, alcohol, THC and cocaine) Patient indicates that he actually did alcohol and cocaine prior to his syncopal episode on day of admission.  Cocaine may have been the precipitating factor.  Reports only drinking 240 ounce beers a couple times a week.  Strongly counseled regarding cessation.  Continue nicotine patch and CIWA protocol.  UDS positive for cocaine, cannabinoid and TCAs.  BAL <10.   Chronic right-sided testicular pain: Scrotal ultrasound without acute findings.  UA, GC, chlamydia and gonorrhea testing negative.  No reported trauma.  Outpatient follow-up.  I did not get a chance to examine this in the morning and was planning to return and do this exam but unfortunately patient was not cooperative and chose to leave AMA.   Anxiety and depression: Not on meds PTA.  Outpatient follow-up.   Anemia: No acute bleeding.  Suspect dilutional from IV fluids.  Outpatient follow-up.   There is no height or weight on file to calculate BMI.  Consultants:   Cardiology was consulted but he left AMA prior to being seen   Procedures:     Discharge Instructions: Left AMA   Allergies  Allergen Reactions   Peanut Butter Flavor       Procedures/Studies: ECHOCARDIOGRAM COMPLETE  Result Date: 10/11/2022    ECHOCARDIOGRAM REPORT   Patient Name:   Alex HousemanBRIAN J Mccuistion Date of Exam: 10/11/2022 Medical Rec #:  [id number]      Height:       66.0 in Accession #:    [id number]     Weight:       225.0 lb Date of Birth:  [birth year]      BSA:          2.102 m Patient Age:    46 years       BP:           141/99 mmHg Patient Gender: M              HR:           63 bpm. Exam Location:  [facility] Procedure: 2D Echo, Cardiac Doppler and Color Doppler Indications:     Syncope  History:         Patient has no prior history of Echocardiogram examinations.                  Signs/Symptoms:Syncope; Risk Factors:Current Smoker. ETOH and                  Polysubstance abuse.  Sonographer:      Mikki Harbororothy Buchanan Referring Phys:  [id number] Theadora RamaANAND D Raha Tennison Diagnosing Phys: Lorine BearsMuhammad Arida MD IMPRESSIONS  1. Left ventricular ejection fraction, by estimation, is 45 to 50%. The left ventricle has mildly decreased function. Left ventricular endocardial border not optimally defined to evaluate regional wall motion. Left ventricular diastolic parameters were normal.  2. Right ventricular systolic function is normal. The right ventricular size is normal. Tricuspid regurgitation signal is inadequate for assessing PA pressure.  3. Left atrial size was mildly dilated.  4. The mitral valve is normal in structure. Trivial mitral valve regurgitation. No evidence of mitral stenosis.  5. The aortic valve is normal in structure. Aortic valve regurgitation is not visualized. No aortic stenosis is present.  6. The inferior vena cava is normal in size with greater than 50% respiratory variability, suggesting right atrial pressure of 3 mmHg. FINDINGS  Left Ventricle: Left ventricular ejection fraction, by estimation, is 45 to 50%. The left ventricle has mildly decreased function. Left ventricular endocardial border not optimally defined to evaluate regional wall motion. The left ventricular internal cavity size was normal in size. There is no left ventricular hypertrophy. Left ventricular diastolic parameters were normal. Right Ventricle: The right ventricular size is normal. No increase in right ventricular wall thickness. Right ventricular systolic function is normal. Tricuspid regurgitation signal is inadequate for assessing PA pressure. Left Atrium: Left atrial size was mildly dilated. Right Atrium: Right atrial size was normal in size. Pericardium: There is no evidence of pericardial effusion. Mitral Valve: The mitral valve is normal in structure. Trivial mitral valve regurgitation. No evidence of mitral valve stenosis. MV peak gradient, 7.2 mmHg. The mean mitral valve gradient is 2.0 mmHg. Tricuspid Valve: The tricuspid valve  is normal in structure. Tricuspid valve regurgitation is not demonstrated. No evidence of tricuspid stenosis. Aortic Valve: The aortic valve is normal in structure. Aortic valve regurgitation is not visualized. No aortic stenosis is present. Aortic valve mean gradient measures 4.0  mmHg. Aortic valve peak gradient measures 6.8 mmHg. Aortic valve area, by VTI measures 2.64 cm. Pulmonic Valve: The pulmonic valve was normal in structure. Pulmonic valve regurgitation is not visualized. No evidence of pulmonic stenosis. Aorta: The aortic root is normal in size and structure. Venous: The inferior vena cava is normal in size with greater than 50% respiratory variability, suggesting right atrial pressure of 3 mmHg. IAS/Shunts: No atrial level shunt detected by color flow Doppler.  LEFT VENTRICLE PLAX 2D LVIDd:         4.70 cm   Diastology LVIDs:         3.00 cm   LV e' medial:    10.60 cm/s LV PW:         1.00 cm   LV E/e' medial:  10.7 LV IVS:        1.10 cm   LV e' lateral:   12.80 cm/s LVOT diam:     2.00 cm   LV E/e' lateral: 8.8 LV SV:         80 LV SV Index:   38 LVOT Area:     3.14 cm  RIGHT VENTRICLE RV Basal diam:  3.00 cm RV Mid diam:    3.10 cm RV S prime:     14.10 cm/s TAPSE (M-mode): 2.7 cm LEFT ATRIUM             Index        RIGHT ATRIUM           Index LA diam:        3.70 cm 1.76 cm/m   RA Area:     16.10 cm LA Vol (A2C):   46.9 ml 22.31 ml/m  RA Volume:   46.40 ml  22.07 ml/m LA Vol (A4C):   53.8 ml 25.59 ml/m LA Biplane Vol: 53.0 ml 25.21 ml/m  AORTIC VALVE                    PULMONIC VALVE AV Area (Vmax):    2.92 cm     PV Vmax:       0.97 m/s AV Area (Vmean):   2.62 cm     PV Peak grad:  3.8 mmHg AV Area (VTI):     2.64 cm AV Vmax:           130.00 cm/s AV Vmean:          91.300 cm/s AV VTI:            0.302 m AV Peak Grad:      6.8 mmHg AV Mean Grad:      4.0 mmHg LVOT Vmax:         121.00 cm/s LVOT Vmean:        76.200 cm/s LVOT VTI:          0.254 m LVOT/AV VTI ratio: 0.84  AORTA Ao Root  diam: 3.20 cm MITRAL VALVE MV Area (PHT): 3.08 cm     SHUNTS MV Area VTI:   1.94 cm     Systemic VTI:  0.25 m MV Peak grad:  7.2 mmHg     Systemic Diam: 2.00 cm MV Mean grad:  2.0 mmHg MV Vmax:       1.34 m/s MV Vmean:      65.6 cm/s MV Decel Time: 246 msec MV E velocity: 113.00 cm/s MV A velocity: 79.70 cm/s MV E/A ratio:  1.42 Lorine Bears MD Electronically signed by Lorine Bears MD Signature Date/Time: 10/11/2022/1:31:42  PM    Final    DG Ankle Complete Right  Result Date: 10/10/2022 CLINICAL DATA:  47 year old male status post fall with lower extremity pain. EXAM: RIGHT ANKLE - COMPLETE 3+ VIEW COMPARISON:  Right foot series today. Right ankle series 08/19/2017. FINDINGS: Chronic degenerative spurring at the calcaneus, talus, tibial plafond, and both malleoli. But mortise joint alignment is maintained. No ankle joint effusion is evident. No acute fracture or dislocation identified. No discrete soft tissue injury. 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 No acute fracture or dislocation identified. Electronically Signed   By: Genevie Ann M.D.   On: 10/10/2022 05:28   DG Foot Complete Left  Result Date: 10/10/2022 CLINICAL DATA:  47 year old male with history of trauma from a fall complaining of lower extremity pain. EXAM: LEFT FOOT - COMPLETE 3+ VIEW; LEFT ANKLE COMPLETE - 3+ VIEW COMPARISON:  Left ankle radiographs 11/09/2016. FINDINGS: Multiple views of the left foot and ankle demonstrate no definite acute displaced fracture or dislocation. Throughout the foot and ankle there are multifocal degenerative changes of osteoarthritis with joint space narrowing, subchondral sclerosis, subchondral cyst formation and osteophyte formation, most evident in the midfoot, hindfoot and at the tibiotalar joint. Small plantar calcaneal enthesophyte incidentally noted. IMPRESSION: 1. No acute radiographic abnormality of the left foot or ankle. 2. 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 3. Small plantar calcaneal enthesophyte. Electronically Signed   By: Vinnie Langton M.D.   On: 10/10/2022 05:27   DG Ankle Complete Left  Result Date: 10/10/2022 CLINICAL DATA:  47 year old male with history of trauma from a fall complaining of lower extremity pain. EXAM: LEFT FOOT - COMPLETE 3+ VIEW; LEFT ANKLE COMPLETE - 3+ VIEW COMPARISON:  Left ankle radiographs 11/09/2016. FINDINGS: Multiple views of the left foot and ankle demonstrate no definite acute displaced fracture or dislocation. Throughout the foot and ankle there are multifocal degenerative changes of osteoarthritis with joint space narrowing, subchondral sclerosis, subchondral cyst formation and osteophyte formation, most evident in the midfoot, hindfoot and at the tibiotalar joint. Small plantar calcaneal enthesophyte incidentally noted. IMPRESSION: 1. No acute radiographic abnormality of the left foot or ankle. 2. 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 3. Small plantar calcaneal enthesophyte. Electronically Signed   By: Vinnie Langton M.D.   On: 10/10/2022 05:27   DG Foot Complete Right  Result Date: 10/10/2022 CLINICAL DATA:  47 year old male status post fall with lower extremity pain. EXAM: RIGHT FOOT COMPLETE - 3+ VIEW COMPARISON:  Right ankle series reported separately. FINDINGS: Three views of the right foot. Maintained alignment. Chronic degenerative spurring of the calcaneus and the anterior talus. Chronic deformity at the base of the 2nd middle phalanx. Chronic appearing deformity also of the 5th proximal phalanx. No acute fracture or dislocation identified. No discrete soft tissue injury. IMPRESSION: No acute fracture or dislocation identified about the right foot. Electronically Signed   By: Genevie Ann M.D.   On: 10/10/2022 05:26   CT Angio Chest PE W and/or Wo Contrast  Result Date:  10/10/2022 CLINICAL DATA:  Positive D-dimer and chest pain, initial encounter EXAM: CT ANGIOGRAPHY CHEST WITH CONTRAST TECHNIQUE: Multidetector CT imaging of the chest was performed using the standard protocol during bolus administration of intravenous contrast. Multiplanar CT image reconstructions and MIPs were obtained to evaluate the vascular anatomy. RADIATION DOSE REDUCTION: This exam was performed according to the departmental dose-optimization program which includes  automated exposure control, adjustment of the mA and/or kV according to patient size and/or use of iterative reconstruction technique. CONTRAST:  15mL OMNIPAQUE IOHEXOL 350 MG/ML SOLN COMPARISON:  None Available. FINDINGS: Cardiovascular: Thoracic aorta and its branches are within normal limits. Mild atherosclerotic calcifications are seen. No aneurysmal dilatation or dissection is noted. Heart is not significantly enlarged in size. The pulmonary artery shows a normal branching pattern bilaterally. No filling defect to suggest pulmonary embolism is noted. Mediastinum/Nodes: Thoracic inlet is within normal limits. No hilar or mediastinal adenopathy is noted. The esophagus as visualized is within normal limits. Lungs/Pleura: The lungs are well aerated bilaterally. No focal infiltrate or sizable effusion is noted. No parenchymal nodules are seen. Upper Abdomen: Visualized upper abdomen is within normal limits. Musculoskeletal: Degenerative changes of the thoracic spine are noted. No acute rib abnormality is seen. Review of the MIP images confirms the above findings. IMPRESSION: No evidence of pulmonary emboli. No acute abnormality seen. Aortic Atherosclerosis (ICD10-I70.0). Electronically Signed   By: Alcide Clever M.D.   On: 10/10/2022 03:48   US SCROTUM W/DOPPLER  Result Date: 10/10/2022 CLINICAL DATA:  Right-sided testicular pain EXAM: SCROTAL ULTRASOUND DOPPLER ULTRASOUND OF THE TESTICLES TECHNIQUE: Complete ultrasound examination of the  testicles, epididymis, and other scrotal structures was performed. Color and spectral Doppler ultrasound were also utilized to evaluate blood flow to the testicles. COMPARISON:  None Available. FINDINGS: Right testicle Measurements: 4.6 x 2.6 x 3.4 cm. No mass or microlithiasis visualized. Scrotal pearl is noted on the right. Left testicle Measurements: 4.3 x 2.3 x 2.9 cm. No mass or microlithiasis visualized. Right epididymis:  3 mm cyst is noted. Left epididymis:  6 mm cyst is noted. Hydrocele:  Small bilateral hydroceles are seen. Varicocele:  Left-sided varicocele is noted. Pulsed Doppler interrogation of both testes demonstrates normal low resistance arterial and venous waveforms bilaterally. IMPRESSION: Bilateral epididymal cysts and hydroceles. Small left varicocele. No acute abnormality to correspond with the given clinical history is noted. Electronically Signed   By: Alcide Clever M.D.   On: 10/10/2022 02:13   DG Chest Portable 1 View  Result Date: 10/10/2022 CLINICAL DATA:  Cough and syncopal episode EXAM: PORTABLE CHEST 1 VIEW COMPARISON:  05/01/2021 FINDINGS: Cardiac shadow is within normal limits and stable. Lungs are clear bilaterally. No bony abnormality is noted. IMPRESSION: No active disease. Electronically Signed   By: Alcide Clever M.D.   On: 10/10/2022 02:11   US Venous Img Lower Bilateral (DVT)  Result Date: 10/10/2022 CLINICAL DATA:  Bilateral lower extremity pain EXAM: BILATERAL LOWER EXTREMITY VENOUS DOPPLER ULTRASOUND TECHNIQUE: Gray-scale sonography with graded compression, as well as color Doppler and duplex ultrasound were performed to evaluate the lower extremity deep venous systems from the level of the common femoral vein and including the common femoral, femoral, profunda femoral, popliteal and calf veins including the posterior tibial, peroneal and gastrocnemius veins when visible. The superficial great saphenous vein was also interrogated. Spectral Doppler was utilized to  evaluate flow at rest and with distal augmentation maneuvers in the common femoral, femoral and popliteal veins. COMPARISON:  None Available. FINDINGS: RIGHT LOWER EXTREMITY Common Femoral Vein: No evidence of thrombus. Normal compressibility, respiratory phasicity and response to augmentation. Saphenofemoral Junction: No evidence of thrombus. Normal compressibility and flow on color Doppler imaging. Profunda Femoral Vein: No evidence of thrombus. Normal compressibility and flow on color Doppler imaging. Femoral Vein: No evidence of thrombus. Normal compressibility, respiratory phasicity and response to augmentation. Popliteal Vein: No evidence of thrombus. Normal compressibility,  respiratory phasicity and response to augmentation. Calf Veins: No evidence of thrombus. Normal compressibility and flow on color Doppler imaging. Superficial Great Saphenous Vein: No evidence of thrombus. Normal compressibility. Venous Reflux:  None. Other Findings:  None. LEFT LOWER EXTREMITY Common Femoral Vein: No evidence of thrombus. Normal compressibility, respiratory phasicity and response to augmentation. Saphenofemoral Junction: No evidence of thrombus. Normal compressibility and flow on color Doppler imaging. Profunda Femoral Vein: No evidence of thrombus. Normal compressibility and flow on color Doppler imaging. Femoral Vein: No evidence of thrombus. Normal compressibility, respiratory phasicity and response to augmentation. Popliteal Vein: No evidence of thrombus. Normal compressibility, respiratory phasicity and response to augmentation. Calf Veins: No evidence of thrombus. Normal compressibility and flow on color Doppler imaging. Superficial Great Saphenous Vein: No evidence of thrombus. Normal compressibility. Venous Reflux:  None. Other Findings:  None. IMPRESSION: No evidence of deep venous thrombosis in either lower extremity. Electronically Signed   By: Layla MawJoshua  Pleasure M.D.   On: 10/10/2022 01:56   CT Cervical Spine  Wo Contrast  Result Date: 10/10/2022 CLINICAL DATA:  Neck trauma. EXAM: CT CERVICAL SPINE WITHOUT CONTRAST TECHNIQUE: Multidetector CT imaging of the cervical spine was performed without intravenous contrast. Multiplanar CT image reconstructions were also generated. RADIATION DOSE REDUCTION: This exam was performed according to the departmental dose-optimization program which includes automated exposure control, adjustment of the mA and/or kV according to patient size and/or use of iterative reconstruction technique. COMPARISON:  01/18/2021 FINDINGS: Alignment: Normal. Skull base and vertebrae: No acute fracture. No primary bone lesion or focal pathologic process. Soft tissues and spinal canal: No prevertebral fluid or swelling. No visible canal hematoma. Disc levels: Degenerative changes identified with disc space narrowing and marginal osteophyte formation from C3-4 through C7-T1. Neural foraminal narrowing noted right worse than left at C4-5, bilaterally at C5-6 and C6-C7. Upper chest: Negative. IMPRESSION: Degenerative changes.  No acute osseous abnormalities. Electronically Signed   By: Layla MawJoshua  Pleasure M.D.   On: 10/10/2022 01:00   CT HEAD WO CONTRAST (5MM)  Result Date: 10/10/2022 CLINICAL DATA:  Syncope, seizure. EXAM: CT HEAD WITHOUT CONTRAST TECHNIQUE: Contiguous axial images were obtained from the base of the skull through the vertex without intravenous contrast. RADIATION DOSE REDUCTION: This exam was performed according to the departmental dose-optimization program which includes automated exposure control, adjustment of the mA and/or kV according to patient size and/or use of iterative reconstruction technique. COMPARISON:  05/29/2017 FINDINGS: Brain: No evidence of acute infarction, hemorrhage, hydrocephalus, extra-axial collection or mass lesion/mass effect. Vascular: No hyperdense vessel or unexpected calcification. Skull: Normal. Negative for fracture or focal lesion. Sinuses/Orbits: No acute  finding. IMPRESSION: No acute intracranial process. Electronically Signed   By: Layla MawJoshua  Pleasure M.D.   On: 10/10/2022 00:52     The results of significant diagnostics from this hospitalization (including imaging, microbiology, ancillary and laboratory) are listed below for reference.     Microbiology: Recent Results (from the past 240 hour(s))  Chlamydia/NGC rt PCR ([facility] only)     Status: None   Collection Time: 10/10/22  1:55 AM   Specimen: Urine, Clean Catch  Result Value Ref Range Status   Specimen source GC/Chlam URINE, RANDOM  Final   Chlamydia Tr NOT DETECTED NOT DETECTED Final   N gonorrhoeae NOT DETECTED NOT DETECTED Final    Comment: (NOTE) This CT/NG assay has not been evaluated in patients with a history of  hysterectomy. Performed at [facility], [address] [place], [place] [address]   Resp panel by  RT-PCR (RSV, Flu A&B, Covid) Anterior Nasal Swab     Status: None   Collection Time: 10/10/22  1:55 AM   Specimen: Anterior Nasal Swab  Result Value Ref Range Status   SARS Coronavirus 2 by RT PCR NEGATIVE NEGATIVE Final    Comment: (NOTE) SARS-CoV-2 target nucleic acids are NOT DETECTED.  The SARS-CoV-2 RNA is generally detectable in upper respiratory specimens during the acute phase of infection. The lowest concentration of SARS-CoV-2 viral copies this assay can detect is 138 copies/mL. A negative result does not preclude SARS-Cov-2 infection and should not be used as the sole basis for treatment or other patient management decisions. A negative result may occur with  improper specimen collection/handling, submission of specimen other than nasopharyngeal swab, presence of viral mutation(s) within the areas targeted by this assay, and inadequate number of viral copies(<138 copies/mL). A negative result must be combined with clinical observations, patient history, and epidemiological information. The expected result is Negative.  Fact Sheet for  Patients:  EntrepreneurPulse.com.au  Fact Sheet for Healthcare Providers:  IncredibleEmployment.be  This test is no t yet approved or cleared by the [place] FDA and  has been authorized for detection and/or diagnosis of SARS-CoV-2 by FDA under an Emergency Use Authorization (EUA). This EUA will remain  in effect (meaning this test can be used) for the duration of the COVID-19 declaration under Section 564(b)(1) of the Act, 21 U.S.C.section 360bbb-3(b)(1), unless the authorization is terminated  or revoked sooner.       Influenza A by PCR NEGATIVE NEGATIVE Final   Influenza B by PCR NEGATIVE NEGATIVE Final    Comment: (NOTE) The Xpert Xpress SARS-CoV-2/FLU/RSV plus assay is intended as an aid in the diagnosis of influenza from Nasopharyngeal swab specimens and should not be used as a sole basis for treatment. Nasal washings and aspirates are unacceptable for Xpert Xpress SARS-CoV-2/FLU/RSV testing.  Fact Sheet for Patients: EntrepreneurPulse.com.au  Fact Sheet for Healthcare Providers: IncredibleEmployment.be  This test is not yet approved or cleared by the [place] FDA and has been authorized for detection and/or diagnosis of SARS-CoV-2 by FDA under an Emergency Use Authorization (EUA). This EUA will remain in effect (meaning this test can be used) for the duration of the COVID-19 declaration under Section 564(b)(1) of the Act, 21 U.S.C. section 360bbb-3(b)(1), unless the authorization is terminated or revoked.     Resp Syncytial Virus by PCR NEGATIVE NEGATIVE Final    Comment: (NOTE) Fact Sheet for Patients: EntrepreneurPulse.com.au  Fact Sheet for Healthcare Providers: IncredibleEmployment.be  This test is not yet approved or cleared by the [place] FDA and has been authorized for detection and/or diagnosis of SARS-CoV-2 by FDA under an Emergency  Use Authorization (EUA). This EUA will remain in effect (meaning this test can be used) for the duration of the COVID-19 declaration under Section 564(b)(1) of the Act, 21 U.S.C. section 360bbb-3(b)(1), unless the authorization is terminated or revoked.  Performed at [facility], [address] [place], [place] [address]      Labs: CBC: Recent Labs  Lab 10/07/22 2013 10/10/22 0020 10/10/22 0803  WBC 6.6 10.1 7.2  NEUTROABS 3.4 7.5  --   HGB 13.1 12.0* 11.0*  HCT 42.1 38.7* 35.9*  MCV 87.0 89.0 88.6  PLT 196 227 564    Basic Metabolic Panel: Recent Labs  Lab 10/07/22 2013 10/10/22 0020 10/10/22 0803  NA 133* 138  --   K 3.8 4.0  --   CL 99 102  --  CO2 24 26  --   GLUCOSE 79 121*  --   BUN 16 13  --   CREATININE 0.66 1.13 0.87  CALCIUM 8.6* 8.9  --   MG  --  2.4  --     Liver Function Tests: Recent Labs  Lab 10/10/22 0020  AST 25  ALT 13  ALKPHOS 75  BILITOT 0.5  PROT 7.6  ALBUMIN 3.7    CBG: Recent Labs  Lab 10/10/22 1617 10/11/22 0551  GLUCAP 111* 98   Urinalysis    Component Value Date/Time   COLORURINE YELLOW (A) 10/10/2022 0155   APPEARANCEUR CLEAR (A) 10/10/2022 0155   APPEARANCEUR Clear 07/24/2013 2209   LABSPEC 1.017 10/10/2022 0155   LABSPEC 1.010 07/24/2013 2209   PHURINE 5.0 10/10/2022 0155   GLUCOSEU NEGATIVE 10/10/2022 0155   GLUCOSEU Negative 07/24/2013 2209   HGBUR NEGATIVE 10/10/2022 0155   BILIRUBINUR NEGATIVE 10/10/2022 0155   BILIRUBINUR Negative 07/24/2013 2209   KETONESUR 5 (A) 10/10/2022 0155   PROTEINUR NEGATIVE 10/10/2022 0155   NITRITE NEGATIVE 10/10/2022 0155   LEUKOCYTESUR NEGATIVE 10/10/2022 0155   LEUKOCYTESUR Negative 07/24/2013 2209      Time coordinating discharge: 25 minutes  SIGNED:  Marcellus ScottAnand Leslye Puccini, MD,  FACP, FHM, SFHM, [facility] Hospital[facility]MPC, [facility]QM-PHYADV   [facility] [place]     To contact the attending provider between 7A-7P or the covering provider during  after hours 7P-7A, please log into the web site [email or web address] and access using universal [place] password for that web site. If you do not have the password, please call the hospital operator.

## 2022-11-07 ENCOUNTER — Emergency Department: Payer: Medicaid Other

## 2022-11-07 ENCOUNTER — Emergency Department
Admission: EM | Admit: 2022-11-07 | Discharge: 2022-11-07 | Disposition: A | Discharge status: Home / Self Care | Payer: Medicaid Other | Attending: Emergency Medicine | Admitting: Emergency Medicine

## 2022-11-07 ENCOUNTER — Other Ambulatory Visit: Payer: Self-pay

## 2022-11-07 DIAGNOSIS — G8929 Other chronic pain: Secondary | ICD-10-CM | POA: Insufficient documentation

## 2022-11-07 DIAGNOSIS — R0609 Other forms of dyspnea: Secondary | ICD-10-CM | POA: Insufficient documentation

## 2022-11-07 DIAGNOSIS — M542 Cervicalgia: Secondary | ICD-10-CM | POA: Insufficient documentation

## 2022-11-07 DIAGNOSIS — R079 Chest pain, unspecified: Secondary | ICD-10-CM | POA: Insufficient documentation

## 2022-11-07 DIAGNOSIS — M545 Low back pain, unspecified: Secondary | ICD-10-CM | POA: Insufficient documentation

## 2022-11-07 LAB — CBC
HCT: 39.4 % (ref 39.0–52.0)
Hemoglobin: 12.4 g/dL — ABNORMAL LOW (ref 13.0–17.0)
MCH: 26.4 pg (ref 26.0–34.0)
MCHC: 31.5 g/dL (ref 30.0–36.0)
MCV: 84 fL (ref 80.0–100.0)
Platelets: 202 10*3/uL (ref 150–400)
RBC: 4.69 MIL/uL (ref 4.22–5.81)
RDW: 15.8 % — ABNORMAL HIGH (ref 11.5–15.5)
WBC: 8.2 10*3/uL (ref 4.0–10.5)
nRBC: 0 % (ref 0.0–0.2)

## 2022-11-07 LAB — BASIC METABOLIC PANEL
Anion gap: 11 (ref 5–15)
BUN: 17 mg/dL (ref 6–20)
CO2: 20 mmol/L — ABNORMAL LOW (ref 22–32)
Calcium: 8.9 mg/dL (ref 8.9–10.3)
Chloride: 104 mmol/L (ref 98–111)
Creatinine, Ser: 0.99 mg/dL (ref 0.61–1.24)
GFR, Estimated: >60 mL/min (ref >60)
Glucose, Bld: 95 mg/dL (ref 70–99)
Potassium: 3.9 mmol/L (ref 3.5–5.1)
Sodium: 135 mmol/L (ref 135–145)

## 2022-11-07 LAB — TROPONIN I (HIGH SENSITIVITY): Troponin I (High Sensitivity): 3 ng/L (ref <18)

## 2022-11-07 NOTE — ED Notes (Signed)
Pt then stated during triage "you can't just put me in the locked behavioral unit then so I dont have to wait in the lobby."

## 2022-11-07 NOTE — ED Notes (Signed)
Pt then stated "You can just put me in the behavioral unit then so I dont have to wait in the lobby".

## 2022-11-07 NOTE — ED Triage Notes (Signed)
Pt to ED from home via EMS. Pt smells of ETOH at this time. Pt complaint of pain all over. Pt was yelling at EMS in the room. EMS advised secretary informed EMS that this same pt has called the ER multiple times tonight for CP. Pt admits to same. Pt is CAOx4 and in no acute distress.

## 2022-11-07 NOTE — ED Provider Notes (Signed)
[facility] Provider Note   Event Date/Time   First MD Initiated Contact with Patient 11/07/22 [phone]     (approximate) History  No chief complaint on file.  HPI Alex [name] is a 47 y.o. male with an extensive past medical history including polysubstance abuse, depression/anxiety, and ADHD who presents complaining of intermittent chest pain and dyspnea on exertion over the last 2 months.  Patient states that the symptoms have accompanied chronic back and neck pain over this time as well.  Patient states that he was recently admitted to the hospital this last month where he received an echocardiogram that he did not receive the results to.  Patient is concerned as he is unable to walk distances at this time without getting severely short of breath. ROS: Patient currently denies any vision changes, tinnitus, difficulty speaking, facial droop, sore throat, abdominal pain, nausea/vomiting/diarrhea, dysuria, or weakness/numbness/paresthesias in any extremity   Physical Exam  Triage Vital Signs: ED Triage Vitals  Enc Vitals Group     BP 11/07/22 0134 108/80     Pulse Rate 11/07/22 0134 72     Resp 11/07/22 0134 16     Temp 11/07/22 0134 98 F (36.7 C)     Temp Source 11/07/22 0134 Oral     SpO2 11/07/22 0134 95 %     Weight 11/07/22 0135 224 lb 13.9 oz (102 kg)     Height 11/07/22 0135 5' 6"$  (1.676 m)     Head Circumference --      Peak Flow --      Pain Score 11/07/22 0135 0     Pain Loc --      Pain Edu? --      Excl. in [place]? --    Most recent vital signs: Vitals:   11/07/22 0134  BP: 108/80  Pulse: 72  Resp: 16  Temp: 98 F (36.7 C)  SpO2: 95%   General: Awake, oriented x4,  CV:  Good peripheral perfusion.  Resp:  Normal effort.  Abd:  No distention.  Other:  Obese middle-aged Caucasian male laying in bed in no acute distress.  Mild slurred speech ED Results / Procedures / Treatments  Labs (all labs ordered are listed, but only abnormal results  are displayed) Labs Reviewed  BASIC METABOLIC PANEL - Abnormal; Notable for the following components:      Result Value   CO2 20 (*)    All other components within normal limits  CBC - Abnormal; Notable for the following components:   Hemoglobin 12.4 (*)    RDW 15.8 (*)    All other components within normal limits  TROPONIN I (HIGH SENSITIVITY)   EKG ED ECG REPORT I, Naaman Plummer, the attending physician, personally viewed and interpreted this ECG. Date: 11/07/2022 EKG Time: 0142 Rate: 69 Rhythm: normal sinus rhythm QRS Axis: normal Intervals: normal ST/T Wave abnormalities: normal Narrative Interpretation: no evidence of acute ischemia RADIOLOGY ED MD interpretation: One-view portable chest x-ray interpreted by me shows no evidence of acute abnormalities including no pneumonia, pneumothorax, or widened mediastinum -Agree with radiology assessment Official radiology report(s): DG Chest 1 View  Result Date: 11/07/2022 CLINICAL DATA:  [id number].  Chest pain. EXAM: CHEST  1 VIEW COMPARISON:  Portable chest 10/10/2022 FINDINGS: The patient is rotated the right. The lungs are expiratory with limited visualization of the lower zones. The visualized lungs are clear. The sulci are sharp. The mediastinum is normally outlined. Heart size and vasculature are normal for  expiration. No acute osseous findings. IMPRESSION: Expiratory chest x-ray with no obvious acute chest process. Limited view of the lower zones. Electronically Signed   By: Telford Nab M.D.   On: 11/07/2022 02:21   PROCEDURES: Critical Care performed: No .1-3 Lead EKG Interpretation  Performed by: Naaman Plummer, MD Authorized by: Naaman Plummer, MD     Interpretation: normal     ECG rate:  71   ECG rate assessment: normal     Rhythm: sinus rhythm     Ectopy: none     Conduction: normal    MEDICATIONS ORDERED IN ED: Medications - No data to display IMPRESSION / MDM / [place] / ED COURSE  I reviewed the  triage vital signs and the nursing notes.                             The patient is on the cardiac monitor to evaluate for evidence of arrhythmia and/or significant heart rate changes. Patient's presentation is most consistent with acute presentation with potential threat to life or bodily function. 47 year old male presents for 2 months of chest pain and dyspnea on exertion Workup: ECG, CXR, CBC, BMP, Troponin Findings: ECG: No overt evidence of STEMI. No evidence of Brugadas sign, delta wave, epsilon wave, significantly prolonged QTc, or malignant arrhythmia HS Troponin: Negative x1 Other Labs unremarkable for emergent problems. CXR: Without PTX, PNA, or widened mediastinum Last Stress Test: Never Last Heart Catheterization: Never HEART Score: 4  Given History, Exam, and Workup I have low suspicion for ACS, Pneumothorax, Pneumonia, Pulmonary Embolus, Tamponade, Aortic Dissection or other emergent problem as a cause for this presentation.   Reassesment: Prior to discharge patients pain was controlled and they were well appearing.  Disposition:  Discharge. Strict return precautions discussed with patient with full understanding. Advised patient to follow up promptly with primary care provider    FINAL CLINICAL IMPRESSION(S) / ED DIAGNOSES   Final diagnoses:  Chest pain, unspecified type  Chronic low back pain without sciatica, unspecified back pain laterality  Dyspnea on exertion   Rx / DC Orders   ED Discharge Orders     None      Note:  This document was prepared using Dragon voice recognition software and may include unintentional dictation errors.   Naaman Plummer, MD 11/07/22 [phone]

## 2022-12-25 ENCOUNTER — Emergency Department
Admission: EM | Admit: 2022-12-25 | Discharge: 2022-12-25 | Disposition: A | Discharge status: Home / Self Care | Payer: Medicaid Other | Attending: Emergency Medicine | Admitting: Emergency Medicine

## 2022-12-25 ENCOUNTER — Emergency Department: Payer: Medicaid Other

## 2022-12-25 ENCOUNTER — Other Ambulatory Visit: Payer: Self-pay

## 2022-12-25 DIAGNOSIS — R079 Chest pain, unspecified: Secondary | ICD-10-CM | POA: Diagnosis present

## 2022-12-25 DIAGNOSIS — Y908 Blood alcohol level of 240 mg/100 ml or more: Secondary | ICD-10-CM | POA: Diagnosis not present

## 2022-12-25 DIAGNOSIS — F10920 Alcohol use, unspecified with intoxication, uncomplicated: Secondary | ICD-10-CM | POA: Insufficient documentation

## 2022-12-25 LAB — CBC
HCT: 41.9 % (ref 39.0–52.0)
Hemoglobin: 13.3 g/dL (ref 13.0–17.0)
MCH: 27.4 pg (ref 26.0–34.0)
MCHC: 31.7 g/dL (ref 30.0–36.0)
MCV: 86.4 fL (ref 80.0–100.0)
Platelets: 204 10*3/uL (ref 150–400)
RBC: 4.85 MIL/uL (ref 4.22–5.81)
RDW: 17.9 % — ABNORMAL HIGH (ref 11.5–15.5)
WBC: 7.2 10*3/uL (ref 4.0–10.5)
nRBC: 0 % (ref 0.0–0.2)

## 2022-12-25 LAB — COMPREHENSIVE METABOLIC PANEL
ALT: 15 U/L (ref 0–44)
AST: 25 U/L (ref 15–41)
Albumin: 4 g/dL (ref 3.5–5.0)
Alkaline Phosphatase: 74 U/L (ref 38–126)
Anion gap: 10 (ref 5–15)
BUN: 18 mg/dL (ref 6–20)
CO2: 22 mmol/L (ref 22–32)
Calcium: 8.7 mg/dL — ABNORMAL LOW (ref 8.9–10.3)
Chloride: 99 mmol/L (ref 98–111)
Creatinine, Ser: 0.78 mg/dL (ref 0.61–1.24)
GFR, Estimated: >60 mL/min (ref >60)
Glucose, Bld: 102 mg/dL — ABNORMAL HIGH (ref 70–99)
Potassium: 4.1 mmol/L (ref 3.5–5.1)
Sodium: 131 mmol/L — ABNORMAL LOW (ref 135–145)
Total Bilirubin: 0.7 mg/dL (ref 0.3–1.2)
Total Protein: 7.9 g/dL (ref 6.5–8.1)

## 2022-12-25 LAB — TROPONIN I (HIGH SENSITIVITY): Troponin I (High Sensitivity): 3 ng/L (ref <18)

## 2022-12-25 LAB — ETHANOL: Alcohol, Ethyl (B): 248 mg/dL — ABNORMAL HIGH (ref <10)

## 2022-12-25 MED ORDER — ONDANSETRON HCL 4 MG/2ML IJ SOLN
4.0000 mg | Freq: Once | INTRAMUSCULAR | Status: AC
  Administered 2022-12-25: 4 mg via INTRAVENOUS
  Filled 2022-12-25: qty 2

## 2022-12-25 MED ORDER — SODIUM CHLORIDE 0.9 % IV BOLUS
1000.0000 mL | Freq: Once | INTRAVENOUS | Status: AC
  Administered 2022-12-25: 1000 mL via INTRAVENOUS

## 2022-12-25 NOTE — ED Notes (Signed)
Attempted to call legal guardian. HIPAA appropriate message left.

## 2022-12-25 NOTE — ED Provider Notes (Signed)
[facility] Provider Note    None    (approximate)  History   Chief Complaint: Chest Pain  HPI  Alex [name] is a 47 y.o. male with a past medically alcohol abuse, presents to the emergency department for chest pain.  According to the patient he states for the past few weeks he has had pain in his chest and all over his body.  Patient admits to alcohol use today states he decided to come get it checked out.  Patient denies any trouble breathing denies any cough or fever.  Immediately patient is asking for something to eat and drink.  Denies any history of heart disease or stents previously.  Physical Exam   Triage Vital Signs: ED Triage Vitals  Enc Vitals Group     BP --      Pulse Rate 12/25/22 1823 68     Resp 12/25/22 1823 20     Temp --      Temp Source 12/25/22 1823 Oral     SpO2 --      Weight 12/25/22 1824 250 lb (113.4 kg)     Height 12/25/22 1824 5\' 5"  (1.651 m)     Head Circumference --      Peak Flow --      Pain Score 12/25/22 1824 10     Pain Loc --      Pain Edu? --      Excl. in [place]? --     Most recent vital signs: Vitals:   12/25/22 1823  Pulse: 68  Resp: 20    General: Awake, no distress.  CV:  Good peripheral perfusion.  Regular rate and rhythm  Resp:  Normal effort.  Equal breath sounds bilaterally.  Abd:  No distention.  Soft, nontender.  No rebound or guarding.  ED Results / Procedures / Treatments   EKG  Patient refused  RADIOLOGY  I have reviewed and interpreted chest x-ray images.  No obvious consolidation seen on my evaluation. Radiology is read the x-ray as negative.   MEDICATIONS ORDERED IN ED: Medications  sodium chloride 0.9 % bolus 1,000 mL (has no administration in time range)  ondansetron (ZOFRAN) injection 4 mg (has no administration in time range)     IMPRESSION / MDM / ASSESSMENT AND PLAN / ED COURSE  I reviewed the triage vital signs and the nursing notes.  Patient's presentation is  most consistent with acute presentation with potential threat to life or bodily function.  Patient presents emergency department for chest pain as well as generalized body pain.  Patient states that has been ongoing for several weeks.  Patient does admit to alcohol intoxication today.  States he does not drink alcohol every day.  Patient is requesting food and drink.  We will check labs including cardiac enzymes, EKG and a chest x-ray.  Differential would include ACS, chest wall discomfort, alcohol intoxication, gastritis or reflux.  Patient's labs have resulted showing reassuring results with a normal CBC, reassuring chemistry with normal renal function.  Patient's troponin reassuringly is negative.  Patient's ethanol level is elevated 248.  Patient is a frequent alcohol user states he does not feel intoxicated, patient is ambulating without any difficulty and is requesting to be discharged home.  We attempted to call his mother who is listed as legal guardian but there has been no response.  Patient is politely asking to be discharged home.  Patient states he was able to reach a friend who is coming  to pick him up.  FINAL CLINICAL IMPRESSION(S) / ED DIAGNOSES   Chest pain Alcohol intoxication  Note:  This document was prepared using Dragon voice recognition software and may include unintentional dictation errors.   Harvest Dark, MD 12/25/22 2037

## 2022-12-25 NOTE — ED Triage Notes (Signed)
Pt to ED from home [facility] Pt complains of generalized chest pain and is ETOH intoxicated per EMS 12 lead NSR   324mg  aspirin given by EMS VSS: 103/53, HR 70, 100% RA, CBG 115  3 empty 40 oz alcohol bottles at home seen by EMS EDP at bedside  Pt noted to have slurred speech Has legal guardian, will call. Hx cocaine abuse and cigarette smoking

## 2022-12-25 NOTE — ED Notes (Signed)
Pt states that his mom is coming to pick him up. Pt bed alarm going off and this RN went to check on pt, he had discontinued his IV and left it leaking all over the floor again and was out at the nurses station demanding his IV be taken out. This RN got pt back into the room to remove IV, ERP notified that pt found a ride and is ready to go. ERP requesting EKG. This RN found pt wandering hallway and asked pt to get an EKG prior to leave, pt walked past this RN stating he's leaving and pt left. ERP notified.

## 2022-12-25 NOTE — ED Notes (Signed)
Upon shift change pt undid his IV and was standing in the hallway stating he wants to leave. PT given a sandwhich and and told that we need to find a plan to get him home pt understanding at this time

## 2022-12-25 NOTE — ED Notes (Signed)
This RN attempted to call pt's mother listed in chart as patient's legal guardian, [phone] and [phone], both times phone rang then was forwarded to voicemail. Primary RN and MD made aware.

## 2022-12-25 NOTE — ED Notes (Signed)
Pt is yelling in room and when this RN enters room, he states that hewants to go home but needs a ride. Pt is told not to yell and use call bell for assistance and this RN will find out from primary nurse and MD what discharge plan is. Pt states that he doesn't have anyone to come get him and that he usually gets a cab home.

## 2022-12-25 NOTE — ED Notes (Signed)
This RN explained to pt that his mom is not answering and at this time he can call someone to get a ride, but ERP advised pt to stay for a few hours. Pt educated that we would like him to stay for monitoring pt yelling at this RN stating to call law enforcement to take him home, pt educated that is not a service that they offer. Pt given a phone to attempt to call someone to get him a ride home.

## 2022-12-25 NOTE — ED Notes (Signed)
Pt placed on bed alarm due to impulsivity.

## 2022-12-25 NOTE — ED Notes (Signed)
Pt mom phone called and no answer, phone sounded discontinued. PT informed that mom did not answer. Pt given phone to attempt to call someone for a ride. ERP notified that pt wants to leave

## 2023-02-07 ENCOUNTER — Emergency Department: Payer: Medicaid Other

## 2023-02-07 ENCOUNTER — Emergency Department
Admission: EM | Admit: 2023-02-07 | Discharge: 2023-02-07 | Disposition: A | Discharge status: Home / Self Care | Payer: Medicaid Other | Attending: Emergency Medicine | Admitting: Emergency Medicine

## 2023-02-07 ENCOUNTER — Other Ambulatory Visit: Payer: Self-pay

## 2023-02-07 DIAGNOSIS — S43005A Unspecified dislocation of left shoulder joint, initial encounter: Secondary | ICD-10-CM

## 2023-02-07 DIAGNOSIS — X500XXA Overexertion from strenuous movement or load, initial encounter: Secondary | ICD-10-CM | POA: Diagnosis not present

## 2023-02-07 MED ORDER — LIDOCAINE HCL (PF) 1 % IJ SOLN
INTRAMUSCULAR | Status: AC
  Administered 2023-02-07: 5 mL via INTRADERMAL
  Filled 2023-02-07: qty 5

## 2023-02-07 MED ORDER — FENTANYL CITRATE PF 50 MCG/ML IJ SOSY
75.0000 ug | PREFILLED_SYRINGE | Freq: Once | INTRAMUSCULAR | Status: AC
  Administered 2023-02-07: 75 ug via INTRAVENOUS
  Filled 2023-02-07: qty 2

## 2023-02-07 MED ORDER — LIDOCAINE HCL (PF) 1 % IJ SOLN: 5.0000 mL | Freq: Once | INTRAMUSCULAR | Status: DC

## 2023-02-07 MED ORDER — LIDOCAINE HCL (PF) 1 % IJ SOLN: 5.0000 mL | Freq: Once | INTRAMUSCULAR | Status: AC

## 2023-02-07 MED ORDER — ETOMIDATE 2 MG/ML IV SOLN
10.0000 mg | Freq: Once | INTRAVENOUS | Status: AC
  Administered 2023-02-07: 10 mg via INTRAVENOUS
  Filled 2023-02-07: qty 10

## 2023-02-07 MED ORDER — LIDOCAINE HCL (PF) 1 % IJ SOLN: 30.0000 mL | Freq: Once | INTRAMUSCULAR | Status: DC

## 2023-02-07 MED ORDER — LIDOCAINE HCL (PF) 1 % IJ SOLN
30.0000 mL | Freq: Once | INTRAMUSCULAR | Status: DC
  Filled 2023-02-07: qty 30

## 2023-02-07 MED ORDER — KETOROLAC TROMETHAMINE 15 MG/ML IJ SOLN
15.0000 mg | Freq: Once | INTRAMUSCULAR | Status: AC
  Administered 2023-02-07: 15 mg via INTRAVENOUS
  Filled 2023-02-07: qty 1

## 2023-02-07 NOTE — ED Provider Notes (Signed)
Reduction of dislocation  Date/Time: 02/07/2023 7:09 AM  Performed by: Sharyn Creamer, MD Authorized by: Sharyn Creamer, MD  Consent: Verbal consent obtained. Written consent obtained. Consent given by: patient Patient understanding: patient states understanding of the procedure being performed Patient consent: the patient's understanding of the procedure matches consent given Procedure consent: procedure consent matches procedure scheduled Relevant documents: relevant documents present and verified Imaging studies: imaging studies available Patient identity confirmed: verbally with patient and arm band Time out: Immediately prior to procedure a "time out" was called to verify the correct patient, procedure, equipment, support staff and site/side marked as required. Patient tolerance: patient tolerated the procedure well with no immediate complications       Sharyn Creamer, MD 02/07/23 0710

## 2023-02-07 NOTE — ED Provider Notes (Signed)
I assisted Dr. Fanny Bien with sedation to facilitate shoulder reduction.   .Sedation  Date/Time: 02/07/2023 7:10 AM  Performed by: Delton Prairie, MD Authorized by: Delton Prairie, MD   Consent:    Consent obtained:  Verbal and written   Consent given by:  Patient Universal protocol:    Immediately prior to procedure, a time out was called: yes   Pre-sedation assessment:    Time since last food or drink:  12hrs   ASA classification: class 2 - patient with mild systemic disease     Mallampati score:  I - soft palate, uvula, fauces, pillars visible   Pre-sedation assessments completed and reviewed: pre-procedure airway patency not reviewed   Immediate pre-procedure details:    Reassessment: Patient reassessed immediately prior to procedure     Reviewed: vital signs, relevant labs/tests and NPO status     Verified: bag valve mask available, emergency equipment available, intubation equipment available, IV patency confirmed, oxygen available and reversal medications available   Procedure details (see MAR for exact dosages):    Preoxygenation:  Nasal cannula   Sedation:  Etomidate   Intended level of sedation: deep   Analgesia:  Fentanyl   Intra-procedure monitoring:  Blood pressure monitoring, frequent LOC assessments, frequent vital sign checks, cardiac monitor, continuous pulse oximetry and continuous capnometry   Intra-procedure events: none     Total Provider sedation time (minutes):  10 Post-procedure details:    Attendance: Constant attendance by certified staff until patient recovered     Recovery: Patient returned to pre-procedure baseline     Procedure completion:  Tolerated well, no immediate complications     Delton Prairie, MD 02/07/23 [phone]

## 2023-02-07 NOTE — ED Notes (Signed)
Patient ready for discharge. Attempted to call both numbers listed for legal guardian in chart without success. Patient states they do not have legal guardian.

## 2023-02-07 NOTE — Discharge Instructions (Addendum)
Please wear the shoulder immobilizer for at least 7 days or until you follow-up with the orthopedic doctors.  It is important you follow-up with the orthopedic doctors.

## 2023-02-07 NOTE — ED Provider Notes (Signed)
Patient was signed out to me pending reassessment after metabolizing and after sedation.  My assessment patient is sitting up in bed mentating normally.  Able to ambulate.  We did attempt to call legal guardian multiple times were not able to get through.  Patient denies having legal guardian.  We have arranged for a cab ride so he has safe ride home.  This point I think he is stable for discharge.   Georga Hacking, MD 02/07/23 [phone]

## 2023-02-07 NOTE — ED Provider Notes (Signed)
[facility] Provider Note    Event Date/Time   First MD Initiated Contact with Patient 02/07/23 [phone]     (approximate)   History   Dislocated Left Shoulder   HPI  Alex [name] is a 47 y.o. male with a history of polysubstance abuse  Today he was at his motel room.  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  His left shoulder is painful and he reports it is "dislocated".  Reports he has dislocated several times in the past.  Is causing a numbing feeling over his fourth and fifth fingers of his left hand.  It is painful in the shoulder joint.  There is no fall or injury  No chest pain no shortness of breath.  No other fall or injury.  Did not fall or strike his head.  Reports pain moderate in the left shoulder and reports it is "dislocated"   EMS patient reports the patient ox saturation 94%.  Patient denies any shortness of breath or respiratory complaint (he is a smoker)  Physical Exam   Triage Vital Signs: ED Triage Vitals  Enc Vitals Group     BP      Pulse      Resp      Temp      Temp src      SpO2      Weight      Height      Head Circumference      Peak Flow      Pain Score      Pain Loc      Pain Edu?      Excl. in GC?     Most recent vital signs: Vitals:   02/07/23 0646 02/07/23 0655  BP: 129/84 132/88  Pulse: 80 72  Resp: (!) 23 (!) 24  Temp:    SpO2:       General: Awake, no distress.  Holds his left arm.  He does appear to be in some pain when he lifts his left arm up he winces.  There is a slight defect in the musculature of the left arm consistent with a probable dislocation. CV:  Good peripheral perfusion.  Strong left radial pulse.All fingers left hand well-perfused.  He reports the tingling over his left fourth and fifth fingers of the left hand, normal motion and sensation on the remainder the hand he is able to demonstrate use and  flexion extension of all fingers.  No numbness over the axillary nerve region.  The left humerus left elbow appear atraumatic.  The clavicle appears atraumatic.  Rib cage appears normal Resp:  Normal effort.  Clear bilateral Abd:  No distention.  Other:     ED Results / Procedures / Treatments   Labs (all labs ordered are listed, but only abnormal results are displayed) Labs Reviewed - No data to display    RADIOLOGY Personally interpreted the patient's initial and second shoulder x-rays as positive for dislocation    DG Shoulder Left Portable  Result Date: 02/07/2023 CLINICAL DATA:  47 year old male with history of left shoulder dislocation status post reduction. EXAM: LEFT SHOULDER COMPARISON:  02/07/2023. FINDINGS: Previously noted left shoulder dislocation has been reduced. Left humeral head resides in the glenoid fossa. Irregularity of the superolateral aspect of the left humeral head suggests associated Hill-Sachs type fracture. There is also a faint osseous density adjacent to  the inferior aspect of the glenoid, which could represent a bony Bankart lesion. Chronic degenerative changes are again noted at the left acromioclavicular joint. IMPRESSION: 1. Successful reduction of previously noted left shoulder dislocation with probable Hill-Sachs type fracture and bony Bankart lesion, as above. Electronically Signed   By: Trudie Reed M.D.   On: 02/07/2023 07:09     PROCEDURES:   SEE separte note for Left shoulder dislocation reduction  Critical Care performed: No  Procedures   MEDICATIONS ORDERED IN ED: Medications  fentaNYL (SUBLIMAZE) injection 75 mcg (75 mcg Intravenous Given 02/07/23 0526)  ketorolac (TORADOL) 15 MG/ML injection 15 mg (15 mg Intravenous Given 02/07/23 0526)  lidocaine (PF) (XYLOCAINE) 1 % injection 5 mL (5 mLs Intradermal Given 02/07/23 0623)  etomidate (AMIDATE) injection 10 mg (10 mg Intravenous Given 02/07/23 1610)     IMPRESSION / MDM /  ASSESSMENT AND PLAN / ED COURSE  I reviewed the triage vital signs and the nursing notes.                              Differential diagnosis includes, but is not limited to, humeral fracture, shoulder dislocation, clavicular injury, etc.  Based on the patient's clinical history and presentation this occurred while he rolled in bed with a history of frequent dislocations it does appear most consistent with a left shoulder dislocation.  He does report paresthesia over his fourth and fifth digits  I attempted with multiple attempts to perform reduction without need for deep sedation as the patient had reported use of ethanol earlier in the night and was not n.p.o. until approximately 1 AM per his report.  He was not deemed a extremely reliable historian however  After attempts and without complication, the patient was not able to fully relax in order to allow reduction.  Dr. Katrinka Blazing assisted me in providing sedation with etomidate to facilitate reduction  Patient did have successful reduction.  During his sedation, he became somewhat unexpectedly hyper agitated, however otherwise tolerated the procedure well.  Patient's presentation is most consistent with acute complicated illness / injury requiring diagnostic workup.   The patient is on the cardiac monitor to evaluate for evidence of arrhythmia and/or significant heart rate changes.     ----------------------------------------- 6:46 AM on 02/07/2023 -----------------------------------------    ----------------------------------------- 7:18 AM on 02/07/2023 ----------------------------------------- Patient alert sitting up requesting a warm blanket and water.  Shoulder slow but sore but otherwise well.  Good neurovascular function reports no ongoing numbness in the left hand.  Normal capillary refill.  Patient wearing a sling.  Recommended strongly to follow-up with orthopedic specialty given the history that this frequently dislocates on  him sometimes normal spontaneous leak, but also advised on traditional follow-up for shoulder dislocation return precautions etc.  Patient is not driving, he does not drive.  Will either take a taxi home or is awaiting ride   Ongoing care assigned to Dr. Sidney Ace as patient continues to be observed post sedation, doing well anticipate discharge.  FINAL CLINICAL IMPRESSION(S) / ED DIAGNOSES   Final diagnoses:  Shoulder dislocation, left, initial encounter     Rx / DC Orders   ED Discharge Orders     None        Note:  This document was prepared using Dragon voice recognition software and may include unintentional dictation errors.   Sharyn Creamer, MD 02/07/23 [phone]

## 2023-03-13 ENCOUNTER — Encounter: Payer: Self-pay | Admitting: Emergency Medicine

## 2023-03-13 ENCOUNTER — Emergency Department: Payer: Medicaid Other

## 2023-03-13 DIAGNOSIS — G40909 Epilepsy, unspecified, not intractable, without status epilepticus: Secondary | ICD-10-CM | POA: Insufficient documentation

## 2023-03-13 DIAGNOSIS — F17210 Nicotine dependence, cigarettes, uncomplicated: Secondary | ICD-10-CM | POA: Insufficient documentation

## 2023-03-13 DIAGNOSIS — S43015A Anterior dislocation of left humerus, initial encounter: Secondary | ICD-10-CM | POA: Diagnosis present

## 2023-03-13 DIAGNOSIS — F419 Anxiety disorder, unspecified: Secondary | ICD-10-CM | POA: Diagnosis not present

## 2023-03-13 DIAGNOSIS — S42032A Displaced fracture of lateral end of left clavicle, initial encounter for closed fracture: Secondary | ICD-10-CM | POA: Insufficient documentation

## 2023-03-13 DIAGNOSIS — M19012 Primary osteoarthritis, left shoulder: Secondary | ICD-10-CM | POA: Diagnosis not present

## 2023-03-13 DIAGNOSIS — F32A Depression, unspecified: Secondary | ICD-10-CM | POA: Diagnosis not present
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
## 2023-03-14 ENCOUNTER — Emergency Department: Payer: Medicaid Other

## 2023-03-14 ENCOUNTER — Ambulatory Visit
Admission: EM | Admit: 2023-03-14 | Discharge: 2023-03-14 | Disposition: A | Discharge status: Home / Self Care | Payer: Medicaid Other | Attending: Emergency Medicine | Admitting: Emergency Medicine

## 2023-03-14 ENCOUNTER — Emergency Department: Payer: Medicaid Other | Admitting: Anesthesiology

## 2023-03-14 ENCOUNTER — Encounter
Admission: EM | Disposition: A | Discharge status: Home / Self Care | Payer: Self-pay | Source: Home / Self Care | Attending: Emergency Medicine

## 2023-03-14 DIAGNOSIS — S43015A Anterior dislocation of left humerus, initial encounter: Secondary | ICD-10-CM

## 2023-03-14 HISTORY — PX: SHOULDER CLOSED REDUCTION: SHX1051

## 2023-03-14 SURGERY — CLOSED REDUCTION, SHOULDER
Anesthesia: Monitor Anesthesia Care | Site: Shoulder | Laterality: Left

## 2023-03-14 MED ORDER — KETAMINE HCL 50 MG/5ML IJ SOSY
PREFILLED_SYRINGE | INTRAMUSCULAR | Status: AC
  Filled 2023-03-14: qty 5

## 2023-03-14 MED ORDER — PROPOFOL 10 MG/ML IV BOLUS
1.0000 mg/kg | Freq: Once | INTRAVENOUS | Status: AC
  Administered 2023-03-14: 60 mg via INTRAVENOUS
  Filled 2023-03-14: qty 20

## 2023-03-14 MED ORDER — SODIUM CHLORIDE 0.9 % IV SOLN: INTRAVENOUS | Status: DC | PRN

## 2023-03-14 MED ORDER — KETAMINE HCL 10 MG/ML IJ SOLN
INTRAMUSCULAR | Status: DC | PRN
  Administered 2023-03-14: 50 mg via INTRAVENOUS

## 2023-03-14 MED ORDER — HYDROMORPHONE HCL 1 MG/ML IJ SOLN
0.5000 mg | Freq: Once | INTRAMUSCULAR | Status: AC
  Administered 2023-03-14: 0.5 mg via INTRAVENOUS
  Filled 2023-03-14: qty 0.5

## 2023-03-14 MED ORDER — MIDAZOLAM HCL 2 MG/2ML IJ SOLN
INTRAMUSCULAR | Status: AC
  Filled 2023-03-14: qty 2

## 2023-03-14 MED ORDER — LIDOCAINE HCL (PF) 1 % IJ SOLN
30.0000 mL | Freq: Once | INTRAMUSCULAR | Status: AC
  Administered 2023-03-14: 30 mL
  Filled 2023-03-14: qty 30

## 2023-03-14 MED ORDER — PROPOFOL 10 MG/ML IV BOLUS
INTRAVENOUS | Status: AC
  Filled 2023-03-14: qty 20

## 2023-03-14 MED ORDER — PROPOFOL 10 MG/ML IV BOLUS
INTRAVENOUS | Status: DC | PRN
  Administered 2023-03-14: 40 mg via INTRAVENOUS
  Administered 2023-03-14: 60 mg via INTRAVENOUS
  Administered 2023-03-14: 50 mg via INTRAVENOUS

## 2023-03-14 MED ORDER — ONDANSETRON HCL 4 MG/2ML IJ SOLN
4.0000 mg | Freq: Once | INTRAMUSCULAR | Status: AC
  Administered 2023-03-14: 4 mg via INTRAVENOUS
  Filled 2023-03-14: qty 2

## 2023-03-14 MED ORDER — MIDAZOLAM HCL 2 MG/2ML IJ SOLN
2.0000 mg | Freq: Once | INTRAMUSCULAR | Status: AC
  Administered 2023-03-14: 2 mg via INTRAVENOUS

## 2023-03-14 SURGICAL SUPPLY — 1 items: IMMOBILIZER SHDR XL LX WHT (SOFTGOODS) IMPLANT

## 2023-03-14 NOTE — Interval H&P Note (Signed)
Patient history and physical updated. Consent reviewed including risks, benefits, and alternatives to surgery. Patient agrees with above plan to proceed with closed left shoulder reduction under anesthesia.
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
## 2023-03-14 NOTE — Discharge Instructions (Addendum)
Keep the left shoulder in the immobilizer at all times to prevent recurrent dislocation. Tylenol or Motrin as needed for pain over-the-counter No weightbearing through the left upper extremity until follow-up Follow-up at [facility] within 1 week call to make an appointment Continue all home medications as previously prescribed by primary care physician For any follow-up concerns or questions please call [facility] orthoped[facility].

## 2023-03-14 NOTE — Op Note (Signed)
Patient Name: Alex [name]  MRN:[id number]  Pre-Operative Diagnosis: Left shoulder anterior dislocation  Post-Operative Diagnosis: (same)  Procedure: Left shoulder examination in the operating room with anesthesia present  Date of Procedure: 03/14/2023  Surgeon: Reinaldo Berber MD  Assistant: None  Anesthesiologist: Corinda Gubler  Anesthesia: Oxygen and monitoring EBL: 0   Complications: None   Brief history: The patient is a 47 year old left-hand-dominant recurrent left shoulder dislocator who presented to the emergency room with an anterior shoulder dislocation overnight.  After multiple attempts by the ER providers I was consulted for evaluation and possible closed reduction in the operating room.  I discussed the risks and benefits of closed reduction with the patient including the risk of humeral fracture, recurrent dislocation, need for surgical intervention.  On examination in preoperative holding the patient did have a sulcus sign of the left shoulder submit the clinical decision to move to the operating room for x-ray evaluation and possible closed reduction attempt.    Description of procedure: The patient was brought to the operating room where laterality was confirmed by all those present to be the left shoulder.  The patient was given intranasal oxygen by anesthesia and the shoulder was reexamined.  There was interval improvement of the sulcus sign of the left shoulder.  Prior to any initiation of anesthetic repeat x-rays were performed with AP and axillary views taken by myself.  These x-rays showed that the shoulder was reduced within the glenohumeral joint.  With smooth motion of the shoulder without crepitus however it was still significantly painful for the patient which I feel likely indicates he could have a rotator cuff tear.  The patient was placed in a shoulder immobilizer in order to maintain the reduction.  The patient tolerated procedure well, good pulses were  found on the operative side post x-ray.  Patient was transferred to the recovery room in stable condition.

## 2023-03-14 NOTE — Anesthesia Postprocedure Evaluation (Signed)
Anesthesia Post Note  Patient: Alex [name]  Procedure(s) Performed: Radiological Exam in OR (Left: Shoulder)  Patient location during evaluation: PACU Anesthesia Type: MAC Level of consciousness: awake and alert Pain management: pain level controlled Vital Signs Assessment: post-procedure vital signs reviewed and stable Respiratory status: spontaneous breathing, nonlabored ventilation, respiratory function stable and patient connected to nasal cannula oxygen Cardiovascular status: stable and blood pressure returned to baseline Postop Assessment: no apparent nausea or vomiting Anesthetic complications: no   No notable events documented.   Last Vitals:  Vitals:   03/14/23 0815 03/14/23 0832  BP: 128/88 (!) 141/94  Pulse: 81 69  Resp: 20 18  Temp:  36.7 C  SpO2: 95% 94%    Last Pain:  Vitals:   03/14/23 0832  TempSrc: Temporal  PainSc: 0-No pain                 Corinda Gubler

## 2023-03-14 NOTE — Anesthesia Procedure Notes (Signed)
Date/Time: 03/14/2023 7:45 AM  Performed by: Elmarie Mainland, CRNAPre-anesthesia Checklist: Patient identified, Emergency Drugs available and Suction available Oxygen Delivery Method: Nasal cannula
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
## 2023-03-14 NOTE — Progress Notes (Signed)
Patient evaluated in recovery room after procedure. Patient neurovascularly intact to left upper extremity, in shoulder immobilizer in place. Discussed likelihood of rotator cuff tear in combination with recurrent shoulder dislocations and advised on follow up in clinic and the importance of getting in so that we can evaluate and make a treatment plan to stabilize his shoulder. All questions answered and he agrees with plan to follow up.

## 2023-03-14 NOTE — Transfer of Care (Signed)
Immediate Anesthesia Transfer of Care Note  Patient: Alex [name]  Procedure(s) Performed: CLOSED REDUCTION SHOULDER (Left: Shoulder)  Patient Location: PACU  Anesthesia Type: No Anesthesia administered  Level of Consciousness: awake, alert , and oriented  Airway & Oxygen Therapy: Patient Spontanous Breathing and Patient connected to nasal cannula oxygen  Post-op Assessment: Report given to RN and Post -op Vital signs reviewed and stable  Post vital signs: Reviewed and stable  Last Vitals:  Vitals Value Taken Time  BP 117/71 03/14/2023 0814  Temp    Pulse 81 03/14/23 0814  Resp    SpO2 95 % 03/14/23 0814  Vitals shown include unvalidated device data.  Last Pain:  Vitals:   03/14/23 0729  TempSrc: Temporal  PainSc:          Complications: No notable events documented.

## 2023-03-14 NOTE — Anesthesia Preprocedure Evaluation (Signed)
Anesthesia Evaluation  Patient identified by MRN, date of birth, ID band Patient awake  General Assessment Comment:Patient AO x 3, though appears sleepy. Able to answer all my questions. Appears to have capacity to consent  Reviewed: Allergy & Precautions, NPO status , Patient's Chart, lab work & pertinent test results  History of Anesthesia Complications Negative for: history of anesthetic complications  Airway Mallampati: III  TM Distance: >3 FB Neck ROM: Full    Dental  (+) Poor Dentition   Pulmonary neg sleep apnea, neg COPD, Current Smoker and Patient abstained from smoking.   Pulmonary exam normal breath sounds clear to auscultation       Cardiovascular Exercise Tolerance: Good METS(-) hypertension(-) CAD and (-) Past MI negative cardio ROS (-) dysrhythmias  Rhythm:Regular Rate:Normal - Systolic murmurs    Neuro/Psych  PSYCHIATRIC DISORDERS Anxiety Depression       GI/Hepatic ,neg GERD  ,,(+)     substance abuse  alcohol use and cocaine useDenies cocaine use today   Endo/Other  neg diabetes    Renal/GU negative Renal ROS     Musculoskeletal   Abdominal  (+) + obese  Peds  Hematology   Anesthesia Other Findings Past Medical History: No date: Alcohol abuse No date: Anxiety 05/27/2016: Cocaine abuse (HCC) No date: Depression No date: Seizures (HCC)  Reproductive/Obstetrics                             Anesthesia Physical Anesthesia Plan  ASA: 2  Anesthesia Plan: General   Post-op Pain Management:    Induction: Intravenous  PONV Risk Score and Plan: 1 and Ondansetron and TIVA  Airway Management Planned: Mask  Additional Equipment: None  Intra-op Plan:   Post-operative Plan:   Informed Consent: I have reviewed the patients History and Physical, chart, labs and discussed the procedure including the risks, benefits and alternatives for the proposed anesthesia with the  patient or authorized representative who has indicated his/her understanding and acceptance.     Dental advisory given  Plan Discussed with: CRNA and Surgeon  Anesthesia Plan Comments: (NPO status confirmed and appropriate. Discussed risks of anesthesia with patient, including PONV, muscle aches. Rare risks discussed as well, such as cardiorespiratory sequelae, need for airway intervention and its associated risks including lip/dental/eye damage and sore throat, and allergic reactions. Discussed the role of CRNA in patient's perioperative care. Patient understands.)       Anesthesia Quick Evaluation

## 2023-03-14 NOTE — Sedation Documentation (Signed)
Attempted multiple times to put shoulder back in place, unsuccessful

## 2023-03-15 ENCOUNTER — Encounter: Payer: Self-pay | Admitting: Orthopedic Surgery

## 2023-04-27 ENCOUNTER — Emergency Department: Payer: MEDICAID

## 2023-04-27 ENCOUNTER — Emergency Department
Admission: EM | Admit: 2023-04-27 | Discharge: 2023-04-27 | Disposition: A | Discharge status: Home / Self Care | Payer: MEDICAID | Source: Home / Self Care | Attending: Emergency Medicine | Admitting: Emergency Medicine

## 2023-04-27 ENCOUNTER — Encounter: Payer: Self-pay | Admitting: Emergency Medicine

## 2023-04-27 ENCOUNTER — Emergency Department
Admission: EM | Admit: 2023-04-27 | Discharge: 2023-04-27 | Discharge status: Against Medical Advice | Payer: MEDICAID | Attending: Emergency Medicine | Admitting: Emergency Medicine

## 2023-04-27 DIAGNOSIS — R45851 Suicidal ideations: Secondary | ICD-10-CM | POA: Insufficient documentation

## 2023-04-27 DIAGNOSIS — M5412 Radiculopathy, cervical region: Secondary | ICD-10-CM | POA: Insufficient documentation

## 2023-04-27 DIAGNOSIS — Z91010 Allergy to peanuts: Secondary | ICD-10-CM | POA: Insufficient documentation

## 2023-04-27 DIAGNOSIS — R4689 Other symptoms and signs involving appearance and behavior: Secondary | ICD-10-CM | POA: Diagnosis present

## 2023-04-27 DIAGNOSIS — R202 Paresthesia of skin: Secondary | ICD-10-CM | POA: Insufficient documentation

## 2023-04-27 DIAGNOSIS — F1914 Other psychoactive substance abuse with psychoactive substance-induced mood disorder: Secondary | ICD-10-CM | POA: Insufficient documentation

## 2023-04-27 DIAGNOSIS — Z5321 Procedure and treatment not carried out due to patient leaving prior to being seen by health care provider: Secondary | ICD-10-CM | POA: Insufficient documentation

## 2023-04-27 DIAGNOSIS — F32A Depression, unspecified: Secondary | ICD-10-CM

## 2023-04-27 DIAGNOSIS — F1090 Alcohol use, unspecified, uncomplicated: Secondary | ICD-10-CM | POA: Diagnosis not present

## 2023-04-27 DIAGNOSIS — F1010 Alcohol abuse, uncomplicated: Secondary | ICD-10-CM

## 2023-04-27 LAB — COMPREHENSIVE METABOLIC PANEL
ALT: 16 U/L (ref 0–44)
AST: 21 U/L (ref 15–41)
Albumin: 3.8 g/dL (ref 3.5–5.0)
Alkaline Phosphatase: 71 U/L (ref 38–126)
Anion gap: 7 (ref 5–15)
BUN: 7 mg/dL (ref 6–20)
CO2: 23 mmol/L (ref 22–32)
Calcium: 8.7 mg/dL — ABNORMAL LOW (ref 8.9–10.3)
Chloride: 107 mmol/L (ref 98–111)
Creatinine, Ser: 0.75 mg/dL (ref 0.61–1.24)
GFR, Estimated: >60 mL/min (ref >60)
Glucose, Bld: 88 mg/dL (ref 70–99)
Potassium: 3.9 mmol/L (ref 3.5–5.1)
Sodium: 137 mmol/L (ref 135–145)
Total Bilirubin: 0.5 mg/dL (ref 0.3–1.2)
Total Protein: 7.3 g/dL (ref 6.5–8.1)

## 2023-04-27 LAB — ETHANOL: Alcohol, Ethyl (B): 222 mg/dL — ABNORMAL HIGH (ref <10)

## 2023-04-27 LAB — CBC
HCT: 39.5 % (ref 39.0–52.0)
Hemoglobin: 12.7 g/dL — ABNORMAL LOW (ref 13.0–17.0)
MCH: 27.8 pg (ref 26.0–34.0)
MCHC: 32.2 g/dL (ref 30.0–36.0)
MCV: 86.4 fL (ref 80.0–100.0)
Platelets: 216 10*3/uL (ref 150–400)
RBC: 4.57 MIL/uL (ref 4.22–5.81)
RDW: 16.4 % — ABNORMAL HIGH (ref 11.5–15.5)
WBC: 7.5 10*3/uL (ref 4.0–10.5)
nRBC: 0 % (ref 0.0–0.2)

## 2023-04-27 LAB — URINE DRUG SCREEN, QUALITATIVE (ARMC ONLY)
Amphetamines, Ur Screen: NOT DETECTED
Barbiturates, Ur Screen: NOT DETECTED
Benzodiazepine, Ur Scrn: NOT DETECTED
Cannabinoid 50 Ng, Ur ~~LOC~~: POSITIVE — AB
Cocaine Metabolite,Ur ~~LOC~~: POSITIVE — AB
MDMA (Ecstasy)Ur Screen: NOT DETECTED
Methadone Scn, Ur: NOT DETECTED
Opiate, Ur Screen: NOT DETECTED
Phencyclidine (PCP) Ur S: NOT DETECTED
Tricyclic, Ur Screen: NOT DETECTED

## 2023-04-27 NOTE — ED Notes (Signed)
Urine sent to the lab at this time.  

## 2023-04-27 NOTE — ED Notes (Signed)
Pt was given his clothes. Pt refused discharge paperwork and refused repeat VS. Steady gait noted noted.

## 2023-04-27 NOTE — ED Notes (Signed)
Pt resting quietly in hall recliner.

## 2023-04-27 NOTE — ED Notes (Signed)
ED Provider at bedside. 

## 2023-04-27 NOTE — ED Provider Notes (Signed)
Emergency Medicine Observation Re-evaluation Note  JAELYN ALVARADO is a 47 y.o. male, seen on rounds today.  Pt initially presented to the ED for complaints of Numbness and Psychiatric Evaluation  Currently, the patient is is no acute distress. Denies any concerns at this time.  Physical Exam  Blood pressure 123/85, pulse 86, temperature 98.2 F (36.8 C), temperature source Oral, resp. rate 20, height 5\' 7"  (1.702 m), weight 111.6 kg, SpO2 94%.  Physical Exam: General: No apparent distress Pulm: Normal WOB Neuro: Moving all extremities Psych: Denies SI or HI Ambulating multiple times in the emergency department without any difficulties.  Appears clinically sober.     ED Course / MDM    I have reviewed the labs performed to date as well as medications administered while in observation.  Recent changes in the last 24 hours include: No acute events overnight.  Plan   Current plan: Patient clinically sober.  States that he wants to go home.  No SI or HI.  States that he already has resources for rehab.    Corena Herter, MD 04/27/23 [phone]
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
## 2023-04-27 NOTE — ED Notes (Signed)
Pt refusing to stay and storms out of ED with his clothing and shoes back in place. Pt with even, stead gait on exit out of lobby.

## 2023-04-27 NOTE — ED Notes (Signed)
Pt walked to bathroom and back. EDP wants to see pt before pt dresses back in own clothes. Pt refusing meal.

## 2023-04-27 NOTE — ED Notes (Signed)
EDP saw pt. Pt denies coming to ER 3 times and demanding ride home.
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
## 2023-04-27 NOTE — ED Notes (Signed)
TTS at bedside. 

## 2023-04-27 NOTE — BH Assessment (Signed)
Comprehensive Clinical Assessment (CCA) Note  04/27/2023 Alex [name] [id number] Recommendations for Services/Supports/Treatments: Psych consult/disposition pending.   Alex [name] is a 47 y.o., Caucasian race, Not Hispanic or Latino ethnicity, ENGLISH speaking male who presented ED voluntarily for an evaluation. Per triage note: Pt LWBS earlier in night and returned with different complaint. Pt reports when he left and was walking that he had episode of numbness to his upper extremities. Pt boisterous and swearing in triage. Pt laughing and smells of ETOH but denies use after leaving from prior visit.  On assessment the pt. admitted that he drinks about 2 beers, daily. Pt reported that he uses crack cocaine; last use prior to arrival. Pt reported that he lives at the [facility]. The pt reported having hx of substance abuse treatment in the past; however, it did not work. The pt. reported that he has SI with a plan to jump off of a bridge; however, he decided to come to the hospital. Pt endorsed having vague HI with no specific plan. The pt. had fair insight and impaired judgement. Pt noted to be under the influence and laughing inappropriately. Pt was in the action stage of change, as he admitted that his substance use is problematic. The pt. is not connected to any services. Pt was fixated on getting out of the hallway into a room due to being in physical pain. Pt was not responding to internal/external stimuli. Pt had slurred speech; however, his thoughts were linear. Pt was oriented x4. Pt presented with a euthymic mood; affect was congruent. Pt had a disheveled appearance. Pt's BAL was 222; UDS + for cocaine and cannabis. Pt denied current AV/H.  Chief Complaint:  Chief Complaint  Patient presents with   Numbness   Psychiatric Evaluation   Visit Diagnosis: Substance induced mood disorder Polysubstance abuse    CCA Screening, Triage and Referral (STR)  Patient Reported Information How  did you hear about [place]? No data recorded Referral name: No data recorded Referral phone number: No data recorded  Whom do you see for routine medical problems? No data recorded Practice/Facility Name: No data recorded Practice/Facility Phone Number: No data recorded Name of Contact: No data recorded Contact Number: No data recorded Contact Fax Number: No data recorded Prescriber Name: No data recorded Prescriber Address (if known): No data recorded  What Is the Reason for Your Visit/Call Today? No data recorded How Long Has This Been Causing You Problems? No data recorded What Do You Feel Would Help You the Most Today? No data recorded  Have You Recently Been in Any Inpatient Treatment (Hospital/Detox/Crisis Center/28-Day Program)? No data recorded Name/Location of Program/Hospital:No data recorded How Long Were You There? No data recorded When Were You Discharged? No data recorded  Have You Ever Received Services From [facility] Before? No data recorded Who Do You See at [facility]? No data recorded  Have You Recently Had Any Thoughts About Hurting Yourself? No data recorded Are You Planning to Commit Suicide/Harm Yourself At This time? No data recorded  Have you Recently Had Thoughts About Hurting Someone [name]? No data recorded Explanation: No data recorded  Have You Used Any Alcohol or Drugs in the Past 24 Hours? No data recorded How Long Ago Did You Use Drugs or Alcohol? No data recorded What Did You Use and How Much? No data recorded  Do You Currently Have a Therapist/Psychiatrist? No data recorded Name of Therapist/Psychiatrist: No data recorded  Have You Been Recently Discharged From Any Office  Practice or Programs? No data recorded Explanation of Discharge From Practice/Program: No data recorded    CCA Screening Triage Referral Assessment Type of Contact: No data recorded Is this Initial or Reassessment? No data recorded Date Telepsych consult ordered in [facility]:  No  data recorded Time Telepsych consult ordered in [facility]:  No data recorded  Patient Reported Information Reviewed? No data recorded Patient Left Without Being Seen? No data recorded Reason for Not Completing Assessment: No data recorded  Collateral Involvement: No data recorded  Does Patient Have a Court Appointed Legal Guardian? No data recorded Name and Contact of Legal Guardian: No data recorded If Minor and Not Living with Parent(s), Who has Custody? No data recorded Is CPS involved or ever been involved? No data recorded Is APS involved or ever been involved? No data recorded  Patient Determined To Be At Risk for Harm To Self or Others Based on Review of Patient Reported Information or Presenting Complaint? No data recorded Method: No data recorded Availability of Means: No data recorded Intent: No data recorded Notification Required: No data recorded Additional Information for Danger to Others Potential: No data recorded Additional Comments for Danger to Others Potential: No data recorded Are There Guns or Other Weapons in Your Home? No data recorded Types of Guns/Weapons: No data recorded Are These Weapons Safely Secured?                            No data recorded Who Could Verify You Are Able To Have These Secured: No data recorded Do You Have any Outstanding Charges, Pending Court Dates, Parole/Probation? No data recorded Contacted To Inform of Risk of Harm To Self or Others: No data recorded  Location of Assessment: No data recorded  Does Patient Present under Involuntary Commitment? No data recorded IVC Papers Initial File Date: No data recorded  [place] of Residence: No data recorded  Patient Currently Receiving the Following Services: No data recorded  Determination of Need: No data recorded  Options For Referral: No data recorded    CCA Biopsychosocial Intake/Chief Complaint:  No data recorded Current Symptoms/Problems: No data recorded  Patient Reported  Schizophrenia/Schizoaffective Diagnosis in Past: No data recorded  Strengths: No data recorded Preferences: No data recorded Abilities: No data recorded  Type of Services Patient Feels are Needed: No data recorded  Initial Clinical Notes/Concerns: No data recorded  Mental Health Symptoms Depression:  No data recorded  Duration of Depressive symptoms: No data recorded  Mania:  No data recorded  Anxiety:   No data recorded  Psychosis:  No data recorded  Duration of Psychotic symptoms: No data recorded  Trauma:  No data recorded  Obsessions:  No data recorded  Compulsions:  No data recorded  Inattention:  No data recorded  Hyperactivity/Impulsivity:  No data recorded  Oppositional/Defiant Behaviors:  No data recorded  Emotional Irregularity:  No data recorded  Other Mood/Personality Symptoms:  No data recorded   Mental Status Exam Appearance and self-care  Stature:  No data recorded  Weight:  No data recorded  Clothing:  No data recorded  Grooming:  No data recorded  Cosmetic use:  No data recorded  Posture/gait:  No data recorded  Motor activity:  No data recorded  Sensorium  Attention:  No data recorded  Concentration:  No data recorded  Orientation:  No data recorded  Recall/memory:  No data recorded  Affect and Mood  Affect:  No data recorded  Mood:  No data recorded  Relating  Eye contact:  No data recorded  Facial expression:  No data recorded  Attitude toward examiner:  No data recorded  Thought and Language  Speech flow: No data recorded  Thought content:  No data recorded  Preoccupation:  No data recorded  Hallucinations:  No data recorded  Organization:  No data recorded  [employer] of Knowledge:  No data recorded  Intelligence:  No data recorded  Abstraction:  No data recorded  Judgement:  No data recorded  Reality Testing:  No data recorded  Insight:  No data recorded  Decision Making:  No data recorded  Social Functioning  Social  Maturity:  No data recorded  Social Judgement:  No data recorded  Stress  Stressors:  No data recorded  Coping Ability:  No data recorded  Skill Deficits:  No data recorded  Supports:  No data recorded    Religion:    Leisure/Recreation:    Exercise/Diet:     CCA Employment/Education Employment/Work Situation:    Education:     CCA Family/Childhood History Family and Relationship History:    Childhood History:     Child/Adolescent Assessment:     CCA Substance Use Alcohol/Drug Use:                           ASAM's:  Six Dimensions of Multidimensional Assessment  Dimension 1:  Acute Intoxication and/or Withdrawal Potential:      Dimension 2:  Biomedical Conditions and Complications:      Dimension 3:  Emotional, Behavioral, or Cognitive Conditions and Complications:     Dimension 4:  Readiness to Change:     Dimension 5:  Relapse, Continued use, or Continued Problem Potential:     Dimension 6:  Recovery/Living Environment:     ASAM Severity Score:    ASAM Recommended Level of Treatment:     Substance use Disorder (SUD)    Recommendations for Services/Supports/Treatments:    DSM5 Diagnoses: Patient Active Problem List   Diagnosis Date Noted   Anterior dislocation of left shoulder 03/14/2023   Syncope 10/10/2022   Depression with anxiety 10/10/2022   Testicular pain 10/10/2022   Obesity (BMI 30-39.9) 10/10/2022   Polysubstance abuse (HCC) 10/10/2022   Alcohol abuse 10/10/2022   Substance induced mood disorder (HCC) 12/30/2016   Moderate recurrent major depression (HCC) 12/30/2016   Cocaine abuse (HCC)    ADHD 08/11/2016   Alcohol dependence with unspecified alcohol-induced disorder (HCC) 06/28/2016   Alcohol withdrawal (HCC) 06/28/2016   Cocaine use disorder, moderate, dependence (HCC) 06/28/2016   Unspecified Depressive disorder 06/28/2016   Tobacco use disorder 06/28/2016   Daysi Boggan R Channing Yeager, LCAS

## 2023-04-27 NOTE — ED Triage Notes (Signed)
Pt LWBS earlier in night and returned with different complaint. Pt reports when he left and was walking that he had episode of numbness to his upper extremities. Pt boisterous and swearing in triage. Pt laughing and smells of ETOH but denies use after leaving from prior visit.

## 2023-04-27 NOTE — Consult Note (Signed)
Psychiatric consult received for "MDD, SI". Patient observed resting comfortably in a recliner chair in the hallway, sleeping soundly. He does not respond to verbal or tactile stimuli. Rise and fall of chest observed,  respirations are even and unlabored. Psychiatry will re-attempt to evaluate him when he is awake and alert to conduct a thorough assessment.    Renelle Stegenga H.Willeen Cass, NP 04/27/2023

## 2023-04-27 NOTE — ED Provider Notes (Addendum)
[facility] Provider Note    Event Date/Time   First MD Initiated Contact with Patient 04/27/23 0327     (approximate)   History   Numbness and Psychiatric Evaluation   HPI  Alex [name] is a 47 y.o. male who left without being seen earlier tonight now returning with different chief complaint.  Patient checked in earlier for depression with vague suicidal ideation.  Left without being seen.  Now checks in for right arm numbness.  Denies recent fall/trauma/injury.  Denies striking head or LOC. + EtOH.  Denies plan for suicide.  Denies HI/AH/VH.  Denies extremity weakness.  Denies slurred speech, altered mentation, chest pain, shortness of breath, abdominal pain, nausea, vomiting or dizziness.     Past Medical History   Past Medical History:  Diagnosis Date   Alcohol abuse    Anxiety    Cocaine abuse (HCC) 05/27/2016   Depression    Seizures (HCC)      Active Problem List   Patient Active Problem List   Diagnosis Date Noted   Anterior dislocation of left shoulder 03/14/2023   Syncope 10/10/2022   Depression with anxiety 10/10/2022   Testicular pain 10/10/2022   Obesity (BMI 30-39.9) 10/10/2022   Polysubstance abuse (HCC) 10/10/2022   Alcohol abuse 10/10/2022   Substance induced mood disorder (HCC) 12/30/2016   Moderate recurrent major depression (HCC) 12/30/2016   Cocaine abuse (HCC)    ADHD 08/11/2016   Alcohol dependence with unspecified alcohol-induced disorder (HCC) 06/28/2016   Alcohol withdrawal (HCC) 06/28/2016   Cocaine use disorder, moderate, dependence (HCC) 06/28/2016   Unspecified Depressive disorder 06/28/2016   Tobacco use disorder 06/28/2016     Past Surgical History   Past Surgical History:  Procedure Laterality Date   FINGER SURGERY     SHOULDER CLOSED REDUCTION Left 03/14/2023   Procedure: Radiological Exam in OR;  Surgeon: Reinaldo Berber, MD;  Location: [facility] ORS;  Service: Orthopedics;  Laterality: Left;      Home Medications   Prior to Admission medications   Medication Sig Start Date End Date Taking? Authorizing Provider  citalopram (CELEXA) 40 MG tablet Take 40 mg by mouth daily.    [provider]  divalproex (DEPAKOTE) 250 MG DR tablet Take 750 mg by mouth at bedtime.    [provider]  lithium carbonate 300 MG capsule Take 300 mg by mouth 2 (two) times daily with a meal.    [provider]  omeprazole (PRILOSEC) 20 MG capsule Take 20 mg by mouth 2 (two) times daily before a meal.    [provider]  promethazine (PHENERGAN) 25 MG tablet Take 25 mg by mouth at bedtime.    [provider]  propranolol (INDERAL) 10 MG tablet Take 10 mg by mouth daily.    [provider]  traZODone (DESYREL) 150 MG tablet Take 150 mg by mouth at bedtime.    [provider]  triamterene-hydrochlorothiazide (DYAZIDE) 37.5-25 MG capsule Take 1 capsule by mouth daily.    [provider]     Allergies  Peanut butter flavor   Family History  History reviewed. No pertinent family history.   Physical Exam  Triage Vital Signs: ED Triage Vitals  Encounter Vitals Group     BP 04/27/23 0246 123/85     Systolic BP Percentile --      Diastolic BP Percentile --      Pulse Rate 04/27/23 0246 86     Resp 04/27/23 0246 20  Temp 04/27/23 0246 98.2 F (36.8 C)     Temp Source 04/27/23 0246 Oral     SpO2 04/27/23 0246 94 %     Weight 04/27/23 0246 246 lb (111.6 kg)     Height 04/27/23 0246 5\' 7"  (1.702 m)     Head Circumference --      Peak Flow --      Pain Score 04/27/23 0258 0     Pain Loc --      Pain Education --      Exclude from Growth Chart --     Updated Vital Signs: BP 123/85 (BP Location: Left Arm)   Pulse 86   Temp 98.2 F (36.8 C) (Oral)   Resp 20   Ht 5\' 7"  (1.702 m)   Wt 111.6 kg   SpO2 94%   BMI 38.53 kg/m    General: Awake, no distress.  Intoxicated. CV:  RRR.  Good peripheral perfusion.   Resp:  Normal effort.  CTAB. Abd:  Nontender.  No distention.  Other:  Mild midline cervical spine tenderness to palpation without step-offs or deformities.  No carotid bruits.  Full range of motion right shoulder on distraction.  2+ radial pulse.  Brisk, less than 5-second cap refill.  5/5 motor strength and sensation.   ED Results / Procedures / Treatments  Labs (all labs ordered are listed, but only abnormal results are displayed) Labs Reviewed - No data to display    EKG  None   RADIOLOGY I have independently visualized and interpreted patient's x-ray as well as noted the radiology interpretation:  Cervical spine x-ray:  Official radiology report(s): No results found.   PROCEDURES:  Critical Care performed: No  Procedures   MEDICATIONS ORDERED IN ED: Medications - No data to display   IMPRESSION / MDM / ASSESSMENT AND PLAN / ED COURSE  I reviewed the triage vital signs and the nursing notes.                             47 year old male presenting with depression with suicidal thoughts without plan, now right upper extremity paresthesia without weakness.  Contracts for safety while in the emergency department.  Will have behavioral medicine speak with him.  Will obtain basic lab work, cervical spine x-ray and reassess.  Patient's presentation is most consistent with exacerbation of chronic illness.  The patient has been placed in psychiatric observation due to the need to provide a safe environment for the patient while obtaining psychiatric consultation and evaluation, as well as ongoing medical and medication management to treat the patient's condition.  The patient has not been placed under full IVC at this time.   Clinical Course as of 04/27/23 0981  Wed Apr 27, 2023  1914 Noted patient had lab work on his earlier visit tonight. [JS]  [id number] Cervical spine x-rays negative for acute process. [JS]    Clinical Course User Index [JS] Irean Hong, MD      FINAL CLINICAL IMPRESSION(S) / ED DIAGNOSES   Final diagnoses:  Cervical radiculopathy  Depression, unspecified depression type     Rx / DC Orders   ED Discharge Orders     None        Note:  This document was prepared using Dragon voice recognition software and may include unintentional dictation errors.   Irean Hong, MD 04/27/23 7829    Irean Hong, MD 04/27/23 [phone]

## 2023-05-03 ENCOUNTER — Emergency Department: Payer: MEDICAID

## 2023-05-03 ENCOUNTER — Emergency Department
Admission: EM | Admit: 2023-05-03 | Discharge: 2023-05-04 | Disposition: A | Discharge status: Home / Self Care | Payer: MEDICAID | Attending: Emergency Medicine | Admitting: Emergency Medicine

## 2023-05-03 ENCOUNTER — Other Ambulatory Visit: Payer: Self-pay

## 2023-05-03 DIAGNOSIS — Z765 Malingerer [conscious simulation]: Secondary | ICD-10-CM

## 2023-05-03 DIAGNOSIS — F1420 Cocaine dependence, uncomplicated: Secondary | ICD-10-CM | POA: Diagnosis present

## 2023-05-03 DIAGNOSIS — F32A Depression, unspecified: Secondary | ICD-10-CM | POA: Diagnosis not present

## 2023-05-03 DIAGNOSIS — F17210 Nicotine dependence, cigarettes, uncomplicated: Secondary | ICD-10-CM | POA: Diagnosis not present

## 2023-05-03 DIAGNOSIS — F1029 Alcohol dependence with unspecified alcohol-induced disorder: Secondary | ICD-10-CM | POA: Diagnosis present

## 2023-05-03 DIAGNOSIS — F331 Major depressive disorder, recurrent, moderate: Secondary | ICD-10-CM | POA: Diagnosis present

## 2023-05-03 DIAGNOSIS — F1910 Other psychoactive substance abuse, uncomplicated: Secondary | ICD-10-CM

## 2023-05-03 DIAGNOSIS — G629 Polyneuropathy, unspecified: Secondary | ICD-10-CM

## 2023-05-03 DIAGNOSIS — M792 Neuralgia and neuritis, unspecified: Secondary | ICD-10-CM | POA: Insufficient documentation

## 2023-05-03 DIAGNOSIS — F17200 Nicotine dependence, unspecified, uncomplicated: Secondary | ICD-10-CM | POA: Diagnosis present

## 2023-05-03 LAB — SALICYLATE LEVEL: Salicylate Lvl: <7 mg/dL — ABNORMAL LOW (ref 7.0–30.0)

## 2023-05-03 LAB — ETHANOL: Alcohol, Ethyl (B): 15 mg/dL — ABNORMAL HIGH (ref <10)

## 2023-05-03 LAB — URINE DRUG SCREEN, QUALITATIVE (ARMC ONLY)
Amphetamines, Ur Screen: NOT DETECTED
Barbiturates, Ur Screen: NOT DETECTED
Benzodiazepine, Ur Scrn: NOT DETECTED
Cannabinoid 50 Ng, Ur ~~LOC~~: POSITIVE — AB
Cocaine Metabolite,Ur ~~LOC~~: POSITIVE — AB
MDMA (Ecstasy)Ur Screen: NOT DETECTED
Methadone Scn, Ur: NOT DETECTED
Opiate, Ur Screen: NOT DETECTED
Phencyclidine (PCP) Ur S: NOT DETECTED
Tricyclic, Ur Screen: NOT DETECTED

## 2023-05-03 LAB — CBC
HCT: 39.1 % (ref 39.0–52.0)
Hemoglobin: 12.3 g/dL — ABNORMAL LOW (ref 13.0–17.0)
MCH: 28.5 pg (ref 26.0–34.0)
MCHC: 31.5 g/dL (ref 30.0–36.0)
MCV: 90.7 fL (ref 80.0–100.0)
Platelets: 232 10*3/uL (ref 150–400)
RBC: 4.31 MIL/uL (ref 4.22–5.81)
RDW: 17.1 % — ABNORMAL HIGH (ref 11.5–15.5)
WBC: 7.7 10*3/uL (ref 4.0–10.5)
nRBC: 0 % (ref 0.0–0.2)

## 2023-05-03 LAB — COMPREHENSIVE METABOLIC PANEL
ALT: 15 U/L (ref 0–44)
AST: 17 U/L (ref 15–41)
Albumin: 3.5 g/dL (ref 3.5–5.0)
Alkaline Phosphatase: 64 U/L (ref 38–126)
Anion gap: 9 (ref 5–15)
BUN: 11 mg/dL (ref 6–20)
CO2: 22 mmol/L (ref 22–32)
Calcium: 8.6 mg/dL — ABNORMAL LOW (ref 8.9–10.3)
Chloride: 101 mmol/L (ref 98–111)
Creatinine, Ser: 1.08 mg/dL (ref 0.61–1.24)
GFR, Estimated: >60 mL/min (ref >60)
Glucose, Bld: 98 mg/dL (ref 70–99)
Potassium: 3.6 mmol/L (ref 3.5–5.1)
Sodium: 132 mmol/L — ABNORMAL LOW (ref 135–145)
Total Bilirubin: 0.3 mg/dL (ref 0.3–1.2)
Total Protein: 7 g/dL (ref 6.5–8.1)

## 2023-05-03 LAB — TROPONIN I (HIGH SENSITIVITY): Troponin I (High Sensitivity): 3 ng/L (ref <18)

## 2023-05-03 LAB — ACETAMINOPHEN LEVEL: Acetaminophen (Tylenol), Serum: <10 ug/mL — ABNORMAL LOW (ref 10–30)

## 2023-05-03 NOTE — ED Notes (Addendum)
Pt dressed out in triage room 2 with this Clinical research associate and EDT Arianne.  Pt dressed out into burgundy colored BH scrubs.  Pt belongings placed into pt belongings bag and labeled with pt labels.  Pt belongings  Blue t-shirt  [employer]  Gray socks Black and white flip flops  Gray underwear  Blue touch screen cell phone

## 2023-05-03 NOTE — ED Triage Notes (Signed)
Pt presents to ER via ems with c/o right arm and leg tingling that started a couple of months ago.  Pt also endorses some dizziness, and occasional pain in center of chest.  Pt also endorses occasional blurry vision, and increased difficulty breathing when laying flat.  Denies significant hx of cardiac, lung, or neurological problems.  Pt is otherwise A&O x4 and in NAD in triage.

## 2023-05-03 NOTE — Consult Note (Signed)
Telepsych Consultation   Reason for Consult:  Psych Evaluation Referring Physician:  Dr. Lenard Lance Location of Patient: [facility] ER Location of Provider: Other: Remote ER  Patient Identification: Alex [name] MRN:  [id number] Principal Diagnosis: Malingering Diagnosis:  Principal Problem:   Malingering Active Problems:   Alcohol dependence with unspecified alcohol-induced disorder (HCC)   Cocaine use disorder, moderate, dependence (HCC)   Unspecified Depressive disorder   Tobacco use disorder   Moderate recurrent major depression (HCC)   Total Time spent with patient: 30 minutes  Subjective:    HPI:  Tele psych Assessment  Alex [name], 47 y.o., male patient seen via tele health by TTS and this provider; chart reviewed and consulted with EDP on 05/04/23.  On evaluation Alex [name] reports that he has been dealing with life's stressors and he feels like he cannot take it anymore.  Reports wanting to hurt the hotel neighbor for talking about his family but then retracted that statement.  This patient is endorsing HI.  However, while in the ER, he has demonstrated no unsafe behavior. There is no evidence of a psychiatric condition which is amendable to inpatient psychiatric hospitalization. Patient's HI appears to be conditional and is a means of manipulation and attention seeking without a true intention to hurt anyone. His behavior is more consistent with someone who is acting in self-preservation, rather than someone who is acutely homicidal.   During evaluation Alex [name] i is alert/oriented x 4; calm/cooperative; and mood congruent with affect.  Patient is speaking in a clear tone at moderate volume, and normal pace; with good eye contact.  His thought process is coherent and relevant; There is no indication that he is currently responding to internal/external stimuli or experiencing delusional thought content.  Patient denies suicidal/self-harm/homicidal ideation, psychosis,  and paranoia.  Patient has remained calm throughout assessment and has answered questions appropriately.     Recommendations:  Reassessment in the AM  Dr. Lenard Lance informed of above recommendation and disposition    Past Psychiatric History:    Alcohol dependence with unspecified alcohol-induced disorder (HCC)   Cocaine use disorder, moderate, dependence (HCC)   Unspecified Depressive disorder   Tobacco use disorder   Moderate recurrent major depression (HCC)   Risk to Self:   Risk to Others:   Prior Inpatient Therapy:   Prior Outpatient Therapy:    Past Medical History:  Past Medical History:  Diagnosis Date   Alcohol abuse    Anxiety    Cocaine abuse (HCC) 05/27/2016   Depression    Seizures (HCC)     Past Surgical History:  Procedure Laterality Date   FINGER SURGERY     SHOULDER CLOSED REDUCTION Left 03/14/2023   Procedure: Radiological Exam in OR;  Surgeon: Reinaldo Berber, MD;  Location: [facility] ORS;  Service: Orthopedics;  Laterality: Left;   Family History: History reviewed. No pertinent family history. Family Psychiatric  History: unknown Social History:  Social History   Substance and Sexual Activity  Alcohol Use Yes   Alcohol/week: 4.0 - 5.0 standard drinks of alcohol   Types: 4 - 5 Cans of beer per week     Social History   Substance and Sexual Activity  Drug Use Yes   Types: Marijuana, Cocaine    Social History   Socioeconomic History   Marital status: Single    Spouse name: Not on file   Number of children: Not on file   Years of education: Not on file   Highest education level:  Not on file  Occupational History   Not on file  Tobacco Use   Smoking status: Every Day    Current packs/day: 1.00    Types: Cigarettes, E-cigarettes   Smokeless tobacco: Never  Substance and Sexual Activity   Alcohol use: Yes    Alcohol/week: 4.0 - 5.0 standard drinks of alcohol    Types: 4 - 5 Cans of beer per week   Drug use: Yes    Types: Marijuana,  Cocaine   Sexual activity: Not Currently  Other Topics Concern   Not on file  Social History Narrative   Not on file   Social Determinants of Health   Financial Resource Strain: Not on file  Food Insecurity: Not on file  Transportation Needs: Not on file  Physical Activity: Not on file  Stress: Not on file  Social Connections: Not on file   Additional Social History:    Allergies:   Allergies  Allergen Reactions   Peanut Butter Flavor     Labs:  Results for orders placed or performed during the hospital encounter of 05/03/23 (from the past 48 hour(s))  CBC     Status: Abnormal   Collection Time: 05/03/23 10:00 PM  Result Value Ref Range   WBC 7.7 4.0 - 10.5 K/uL   RBC 4.31 4.22 - 5.81 MIL/uL   Hemoglobin 12.3 (L) 13.0 - 17.0 g/dL   HCT 16.1 09.6 - 04.5 %   MCV 90.7 80.0 - 100.0 fL   MCH 28.5 26.0 - 34.0 pg   MCHC 31.5 30.0 - 36.0 g/dL   RDW 40.9 (H) 81.1 - 91.4 %   Platelets 232 150 - 400 K/uL   nRBC 0.0 0.0 - 0.2 %    Comment: Performed at [facility], [address] [place], [place] [address]  Comprehensive metabolic panel     Status: Abnormal   Collection Time: 05/03/23 10:00 PM  Result Value Ref Range   Sodium 132 (L) 135 - 145 mmol/L   Potassium 3.6 3.5 - 5.1 mmol/L   Chloride 101 98 - 111 mmol/L   CO2 22 22 - 32 mmol/L   Glucose, Bld 98 70 - 99 mg/dL    Comment: Glucose reference range applies only to samples taken after fasting for at least 8 hours.   BUN 11 6 - 20 mg/dL   Creatinine, Ser 6.21 0.61 - 1.24 mg/dL   Calcium 8.6 (L) 8.9 - 10.3 mg/dL   Total Protein 7.0 6.5 - 8.1 g/dL   Albumin 3.5 3.5 - 5.0 g/dL   AST 17 15 - 41 U/L   ALT 15 0 - 44 U/L   Alkaline Phosphatase 64 38 - 126 U/L   Total Bilirubin 0.3 0.3 - 1.2 mg/dL   GFR, Estimated >30 >86 mL/min    Comment: (NOTE) Calculated using the CKD-EPI Creatinine Equation (2021)    Anion gap 9 5 - 15    Comment: Performed at [facility], [address] [place], [place]  [address]  Troponin I (High Sensitivity)     Status: None   Collection Time: 05/03/23 10:00 PM  Result Value Ref Range   Troponin I (High Sensitivity) 3 <18 ng/L    Comment: (NOTE) Elevated high sensitivity troponin I (hsTnI) values and significant  changes across serial measurements may suggest ACS but many other  chronic and acute conditions are known to elevate hsTnI results.  Refer to the "Links" section for chest pain algorithms and additional  guidance. Performed at [facility], [address] [place], [place] [address]   Ethanol     Status: Abnormal   Collection Time: 05/03/23 10:00 PM  Result Value Ref Range   Alcohol, Ethyl (B) 15 (H) <10 mg/dL    Comment: (NOTE) Lowest detectable limit for serum alcohol is 10 mg/dL.  For medical purposes only. Performed at [facility], [address] [place], [place] [address]   Salicylate level     Status: Abnormal   Collection Time: 05/03/23 10:00 PM  Result Value Ref Range   Salicylate Lvl <7.0 (L) 7.0 - 30.0 mg/dL    Comment: Performed at [facility], [address] [place], [place] [address]  Acetaminophen level     Status: Abnormal   Collection Time: 05/03/23 10:00 PM  Result Value Ref Range   Acetaminophen (Tylenol), Serum <10 (L) 10 - 30 ug/mL    Comment: (NOTE) Therapeutic concentrations vary significantly. A range of 10-30 ug/mL  may be an effective concentration for many patients. However, some  are best treated at concentrations outside of this range. Acetaminophen concentrations >150 ug/mL at 4 hours after ingestion  and >50 ug/mL at 12 hours after ingestion are often associated with  toxic reactions.  Performed at [facility], [address] [place], [place] [address]   Urine Drug Screen, Qualitative     Status: Abnormal   Collection Time: 05/03/23 10:08 PM  Result Value Ref Range   Tricyclic, Ur Screen NONE DETECTED NONE DETECTED   Amphetamines, Ur Screen NONE  DETECTED NONE DETECTED   MDMA (Ecstasy)Ur Screen NONE DETECTED NONE DETECTED   Cocaine Metabolite,Ur [place] POSITIVE (A) NONE DETECTED   Opiate, Ur Screen NONE DETECTED NONE DETECTED   Phencyclidine (PCP) Ur S NONE DETECTED NONE DETECTED   Cannabinoid 50 Ng, Ur [place] POSITIVE (A) NONE DETECTED   Barbiturates, Ur Screen NONE DETECTED NONE DETECTED   Benzodiazepine, Ur Scrn NONE DETECTED NONE DETECTED   Methadone Scn, Ur NONE DETECTED NONE DETECTED    Comment: (NOTE) Tricyclics + metabolites, urine    Cutoff 1000 ng/mL Amphetamines + metabolites, urine  Cutoff 1000 ng/mL MDMA (Ecstasy), urine              Cutoff 500 ng/mL Cocaine Metabolite, urine          Cutoff 300 ng/mL Opiate + metabolites, urine        Cutoff 300 ng/mL Phencyclidine (PCP), urine         Cutoff 25 ng/mL Cannabinoid, urine                 Cutoff 50 ng/mL Barbiturates + metabolites, urine  Cutoff 200 ng/mL Benzodiazepine, urine              Cutoff 200 ng/mL Methadone, urine                   Cutoff 300 ng/mL  The urine drug screen provides only a preliminary, unconfirmed analytical test result and should not be used for non-medical purposes. Clinical consideration and professional judgment should be applied to any positive drug screen result due to possible interfering substances. A more specific alternate chemical method must be used in order to obtain a confirmed analytical result. Gas chromatography / mass spectrometry (GC/MS) is the preferred confirm atory method. Performed at [facility], [address] [place], [place] [address]     Medications:  No current facility-administered medications for this encounter.   Current Outpatient Medications  Medication Sig Dispense Refill  citalopram (CELEXA) 40 MG tablet Take 40 mg by mouth daily.     divalproex (DEPAKOTE) 250 MG DR tablet Take 750 mg by mouth at bedtime.     lithium carbonate 300 MG capsule Take 300 mg by mouth 2 (two) times daily with a meal.      omeprazole (PRILOSEC) 20 MG capsule Take 20 mg by mouth 2 (two) times daily before a meal.     promethazine (PHENERGAN) 25 MG tablet Take 25 mg by mouth at bedtime.     propranolol (INDERAL) 10 MG tablet Take 10 mg by mouth daily.     traZODone (DESYREL) 150 MG tablet Take 150 mg by mouth at bedtime.     triamterene-hydrochlorothiazide (DYAZIDE) 37.5-25 MG capsule Take 1 capsule by mouth daily.      Musculoskeletal: Strength & Muscle Tone: within normal limits Gait & Station: normal Patient leans: Right  Psychiatric Specialty Exam:  Presentation  General Appearance:  Disheveled  Eye Contact: Good  Speech: Clear and Coherent  Speech Volume: Normal  Handedness: Right   Mood and Affect  Mood: Euthymic  Affect: Congruent   Thought Process  Thought Processes: Coherent  Descriptions of Associations:Intact  Orientation:Full (Time, Place and Person)  Thought Content:WDL  Suicidal Thoughts:none Homicidal Thoughts:  no plan, no target  Sensorium  Memory: Immediate Good; Recent Fair  Judgment: Fair  Insight: Fair   Art therapist  Concentration: Fair  Attention Span: Good  Recall: [employer] of Knowledge: Fair  Language: Fair   Psychomotor Activity  Psychomotor Activity:  WNL  Assets  Assets: Housing; Resilience; Physical Health   Sleep  Sleep: fair   Physical Exam: Physical Exam Vitals and nursing note reviewed.  HENT:     Head: Normocephalic and atraumatic.     Nose: Nose normal.     Mouth/Throat:     Mouth: Mucous membranes are moist.  Eyes:     Pupils: Pupils are equal, round, and reactive to light.  Pulmonary:     Effort: Pulmonary effort is normal.  Musculoskeletal:        General: Normal range of motion.     Cervical back: Normal range of motion.  Skin:    General: Skin is warm.  Neurological:     Mental Status: He is alert and oriented to person, place, and time.  Psychiatric:        Attention and  Perception: Attention and perception normal.        Mood and Affect: Mood is depressed. Affect is flat.        Speech: Speech normal.        Behavior: Behavior is cooperative.        Thought Content: Thought content includes homicidal ideation.        Cognition and Memory: Cognition and memory normal.        Judgment: Judgment normal.    Review of Systems  Psychiatric/Behavioral:  Positive for depression and substance abuse. Negative for hallucinations. The patient is not nervous/anxious and does not have insomnia.   All other systems reviewed and are negative.  Blood pressure 107/67, pulse 71, temperature 98.3 F (36.8 C), temperature source Oral, resp. rate 16, SpO2 94%. There is no height or weight on file to calculate BMI.   Disposition: Supportive therapy provided about ongoing stressors. Discussed crisis plan, support from social network, calling 911, coming to the Emergency Department, and calling Suicide Hotline. Reassess in the AM  This service was provided via telemedicine using a 2-way,  interactive audio and Immunologist.     Jearld Lesch, NP 05/04/2023 1:33 AM

## 2023-05-03 NOTE — ED Notes (Signed)
Pt provided with hospital snack tray per request. Pt denied wanting a water when offered multiple times.

## 2023-05-03 NOTE — ED Provider Notes (Signed)
[facility] Provider Note    Event Date/Time   First MD Initiated Contact with Patient 05/03/23 2252     (approximate)  History   Chief Complaint: Tingling and Psychiatric Evaluation  HPI  Alex [name] is a 47 y.o. male with a past medical history of alcohol abuse, anxiety, cocaine use, seizures, presents emergency department with various complaints.  According to the patient for the last several months or years he has been experiencing intermittent pain and numbness in his right elbow going down to his fingers and sometimes in the left side.  He also states he will sometimes get numbness and tingling and pain from his right hip down to his right knee but never past his right knee.  He states this has been ongoing for quite some time but he never brings it up.  He also states he is staying at [employer], and if we send it back there tonight he is going to hurt somebody because "he cannot do it anymore".  Patient is asking for something to eat and drink.  Physical Exam   Triage Vital Signs: ED Triage Vitals  Encounter Vitals Group     BP 05/03/23 2155 107/67     Systolic BP Percentile --      Diastolic BP Percentile --      Pulse Rate 05/03/23 2155 71     Resp 05/03/23 2155 16     Temp 05/03/23 2155 98.3 F (36.8 C)     Temp Source 05/03/23 2155 Oral     SpO2 05/03/23 2155 94 %     Weight --      Height --      Head Circumference --      Peak Flow --      Pain Score 05/03/23 2158 10     Pain Loc --      Pain Education --      Exclude from Growth Chart --     Most recent vital signs: Vitals:   05/03/23 2155  BP: 107/67  Pulse: 71  Resp: 16  Temp: 98.3 F (36.8 C)  SpO2: 94%    General: Awake, no distress.  Lying in bed no distress.  Eating a sandwich. CV:  Good peripheral perfusion.  Regular rate and rhythm  Resp:  Normal effort.  Equal breath sounds bilaterally.  Abd:  No distention.  Soft, nontender.  No rebound or guarding.  ED  Results / Procedures / Treatments   EKG viewed and interpreted by myself shows a normal sinus rhythm at 66 bpm with a narrow QRS, normal axis, normal intervals, no concerning ST changes  I have reviewed and interpreted chest x-ray images.  No consolidation seen on my evaluation. Radiology does states some haziness in the left lower lobe could be due to poor inspiration.   He has no infectious symptoms to suggest pneumonia.  MEDICATIONS ORDERED IN ED: Medications - No data to display   IMPRESSION / MDM / ASSESSMENT AND PLAN / ED COURSE  I reviewed the triage vital signs and the nursing notes.  Patient's presentation is most consistent with acute presentation with potential threat to life or bodily function.  Patient presents to the emergency department with various complaints of tingling throughout his body and sometimes pain throughout his body sometimes in his right arm sometimes his left arm and sometimes in his right thigh.  Symptoms are very vague.  When I discussed possible neuropathy as well as his reassuring lab  work, he then states he actually came in because he is afraid he is going to hurt somebody if we send him back to the motel that he is currently staying in.  Given patient's history of psychiatric illness will have psychiatry TTS evaluate otherwise highly suspect the patient is malingering for a place to stay.  From a medical perspective patient's symptoms have been ongoing for quite some time could represent peripheral neuropathy, but does not appear to have an acute issue and the patient will need to follow-up with a PCP.  Patient's lab work shows a reassuring CBC, reassuring chemistry, troponin is normal and alcohol level is 15.  Will have psychiatry TTS evaluate.  FINAL CLINICAL IMPRESSION(S) / ED DIAGNOSES   Psychiatric evaluation Neuropathy   Note:  This document was prepared using Dragon voice recognition software and may include unintentional dictation errors.    Minna Antis, MD 05/03/23 2256

## 2023-05-04 DIAGNOSIS — F331 Major depressive disorder, recurrent, moderate: Secondary | ICD-10-CM

## 2023-05-04 DIAGNOSIS — G629 Polyneuropathy, unspecified: Secondary | ICD-10-CM | POA: Diagnosis not present

## 2023-05-04 DIAGNOSIS — F32A Depression, unspecified: Secondary | ICD-10-CM

## 2023-05-04 DIAGNOSIS — Z765 Malingerer [conscious simulation]: Secondary | ICD-10-CM

## 2023-05-04 NOTE — Consult Note (Incomplete)
[facility] Face-to-Face Psychiatry Consult Reassessment   Reason for Consult:  Psychiatric Evaluation Referring Physician:  Minna Antis, MD Patient Identification: Alex [name] MRN:  [id number] Principal Diagnosis: Moderate recurrent major depression (HCC) Diagnosis:  Principal Problem:   Moderate recurrent major depression (HCC) Active Problems:   Alcohol dependence with unspecified alcohol-induced disorder (HCC)   Cocaine use disorder, moderate, dependence (HCC)   Depression   Tobacco use disorder   Neuropathy   Total Time spent with patient: 30 minutes  Subjective:  "Sometimes I just don't want nobody calling the police on me."   8-7 - HPI:  Patient seen face to face by this provider, consulted with psychiatrist Dr. Marval Regal and emergency department physician Dr.Quale; and chart reviewed on 05/04/23.  Alex [name] is a 47 y.o. male patient presented to the emergency department via EMS with chief complaints of right arm and leg tingling that started a couple of months ago. He also endorsed dizziness, occasional central chest pain, blurry vision, and increased difficulty breathing when lying flat. The patient has a past history of alcohol abuse, anxiety, and cocaine use.  On evaluation today, patient observed lying on the stretcher in the hallway, asleep upon approach, irritable upon awakening with minimal eye contact. He continues to endorse homicidal ideation against a neighbor at the hotel without a specific plan. He reports inconsistent living conditions, staying at a hotel and occasionally with his mother. He denies suicidal ideation, auditory or visual hallucinations. He reports a mental health history of bipolar disorder and identifies a friend as his support system. He reports daily marijuana use, occasional alcohol use with the last consumption being one beer yesterday, and smoking cigarettes and vaping. The patient states that he is non-compliant with his medications, claiming  "they don't work", and that his medications are managed by his primary care physician. He reports that he is currently unemployed and has applied for disability, claiming to have had it before.  During the evaluation, patient appears disheveled, dressed in hospital scrubs. He is alert, oriented x 4, clam and cooperative. Speech is clear, normal rate and coherent. Eye contact is good. Mood is euthymic; affect is congruent with mood. Thought process is coherent and thought content is within defined limits. Patient is able to converse coherently, goal directed thoughts, no distractibility, or pre-occupation.    HPI on admission per ED MD 8/06: Alex [name] is a 47 y.o. male with a past medical history of alcohol abuse, anxiety, cocaine use, seizures, presents emergency department with various complaints.  According to the patient for the last several months or years he has been experiencing intermittent pain and numbness in his right elbow going down to his fingers and sometimes in the left side.  He also states he will sometimes get numbness and tingling and pain from his right hip down to his right knee but never past his right knee.  He states this has been ongoing for quite some time but he never brings it up.  He also states he is staying at [employer], and if we send it back there tonight he is going to hurt somebody because "he cannot do it anymore".  Patient is asking for something to eat and drink.  Past Psychiatric History:  Alcohol dependence with unspecified alcohol-induced disorder (HCC)  Cocaine use disorder, moderate, dependence (HCC)  Unspecified Depressive disorder  Tobacco use disorder  Moderate recurrent major depression (HCC)  Risk to Self:  No  Risk to Others: No   Prior Inpatient  Therapy:  Yes  Prior Outpatient Therapy:  Noncompliance   Past Medical History:  Past Medical History:  Diagnosis Date   Alcohol abuse    Anxiety    Cocaine abuse (HCC) 05/27/2016   Depression     Seizures (HCC)     Past Surgical History:  Procedure Laterality Date   FINGER SURGERY     SHOULDER CLOSED REDUCTION Left 03/14/2023   Procedure: Radiological Exam in OR;  Surgeon: Reinaldo Berber, MD;  Location: [facility] ORS;  Service: Orthopedics;  Laterality: Left;   Family History: History reviewed. No pertinent family history. Family Psychiatric  History: Unknown Social History:  Social History   Substance and Sexual Activity  Alcohol Use Yes   Alcohol/week: 4.0 - 5.0 standard drinks of alcohol   Types: 4 - 5 Cans of beer per week     Social History   Substance and Sexual Activity  Drug Use Yes   Types: Marijuana, Cocaine    Social History   Socioeconomic History   Marital status: Single    Spouse name: Not on file   Number of children: Not on file   Years of education: Not on file   Highest education level: Not on file  Occupational History   Not on file  Tobacco Use   Smoking status: Every Day    Current packs/day: 1.00    Types: Cigarettes, E-cigarettes   Smokeless tobacco: Never  Substance and Sexual Activity   Alcohol use: Yes    Alcohol/week: 4.0 - 5.0 standard drinks of alcohol    Types: 4 - 5 Cans of beer per week   Drug use: Yes    Types: Marijuana, Cocaine   Sexual activity: Not Currently  Other Topics Concern   Not on file  Social History Narrative   Not on file   Social Determinants of Health   Financial Resource Strain: Not on file  Food Insecurity: Not on file  Transportation Needs: Not on file  Physical Activity: Not on file  Stress: Not on file  Social Connections: Not on file   Additional Social History:    Allergies:   Allergies  Allergen Reactions   Peanut Butter Flavor     Labs:  Results for orders placed or performed during the hospital encounter of 05/03/23 (from the past 48 hour(s))  CBC     Status: Abnormal   Collection Time: 05/03/23 10:00 PM  Result Value Ref Range   WBC 7.7 4.0 - 10.5 K/uL   RBC 4.31 4.22 - 5.81  MIL/uL   Hemoglobin 12.3 (L) 13.0 - 17.0 g/dL   HCT 78.2 95.6 - 21.3 %   MCV 90.7 80.0 - 100.0 fL   MCH 28.5 26.0 - 34.0 pg   MCHC 31.5 30.0 - 36.0 g/dL   RDW 08.6 (H) 57.8 - 46.9 %   Platelets 232 150 - 400 K/uL   nRBC 0.0 0.0 - 0.2 %    Comment: Performed at [facility], [address] [place], [place] [address]  Comprehensive metabolic panel     Status: Abnormal   Collection Time: 05/03/23 10:00 PM  Result Value Ref Range   Sodium 132 (L) 135 - 145 mmol/L   Potassium 3.6 3.5 - 5.1 mmol/L   Chloride 101 98 - 111 mmol/L   CO2 22 22 - 32 mmol/L   Glucose, Bld 98 70 - 99 mg/dL    Comment: Glucose reference range applies only to samples taken after fasting for at least 8 hours.  BUN 11 6 - 20 mg/dL   Creatinine, Ser 7.82 0.61 - 1.24 mg/dL   Calcium 8.6 (L) 8.9 - 10.3 mg/dL   Total Protein 7.0 6.5 - 8.1 g/dL   Albumin 3.5 3.5 - 5.0 g/dL   AST 17 15 - 41 U/L   ALT 15 0 - 44 U/L   Alkaline Phosphatase 64 38 - 126 U/L   Total Bilirubin 0.3 0.3 - 1.2 mg/dL   GFR, Estimated >95 >62 mL/min    Comment: (NOTE) Calculated using the CKD-EPI Creatinine Equation (2021)    Anion gap 9 5 - 15    Comment: Performed at [facility], [address], [place], [place] [address]  Troponin I (High Sensitivity)     Status: None   Collection Time: 05/03/23 10:00 PM  Result Value Ref Range   Troponin I (High Sensitivity) 3 <18 ng/L    Comment: (NOTE) Elevated high sensitivity troponin I (hsTnI) values and significant  changes across serial measurements may suggest ACS but many other  chronic and acute conditions are known to elevate hsTnI results.  Refer to the "Links" section for chest pain algorithms and additional  guidance. Performed at [facility], [address] [place], [place] [address]   Ethanol     Status: Abnormal   Collection Time: 05/03/23 10:00 PM  Result Value Ref Range   Alcohol, Ethyl (B) 15 (H) <10 mg/dL    Comment: (NOTE) Lowest  detectable limit for serum alcohol is 10 mg/dL.  For medical purposes only. Performed at [facility], [address] [place], [place] [address]   Salicylate level     Status: Abnormal   Collection Time: 05/03/23 10:00 PM  Result Value Ref Range   Salicylate Lvl <7.0 (L) 7.0 - 30.0 mg/dL    Comment: Performed at [facility], [address] [place], [place] [address]  Acetaminophen level     Status: Abnormal   Collection Time: 05/03/23 10:00 PM  Result Value Ref Range   Acetaminophen (Tylenol), Serum <10 (L) 10 - 30 ug/mL    Comment: (NOTE) Therapeutic concentrations vary significantly. A range of 10-30 ug/mL  may be an effective concentration for many patients. However, some  are best treated at concentrations outside of this range. Acetaminophen concentrations >150 ug/mL at 4 hours after ingestion  and >50 ug/mL at 12 hours after ingestion are often associated with  toxic reactions.  Performed at [facility], [address] [place], [place] [address]   Urine Drug Screen, Qualitative     Status: Abnormal   Collection Time: 05/03/23 10:08 PM  Result Value Ref Range   Tricyclic, Ur Screen NONE DETECTED NONE DETECTED   Amphetamines, Ur Screen NONE DETECTED NONE DETECTED   MDMA (Ecstasy)Ur Screen NONE DETECTED NONE DETECTED   Cocaine Metabolite,Ur [place] POSITIVE (A) NONE DETECTED   Opiate, Ur Screen NONE DETECTED NONE DETECTED   Phencyclidine (PCP) Ur S NONE DETECTED NONE DETECTED   Cannabinoid 50 Ng, Ur [place] POSITIVE (A) NONE DETECTED   Barbiturates, Ur Screen NONE DETECTED NONE DETECTED   Benzodiazepine, Ur Scrn NONE DETECTED NONE DETECTED   Methadone Scn, Ur NONE DETECTED NONE DETECTED    Comment: (NOTE) Tricyclics + metabolites, urine    Cutoff 1000 ng/mL Amphetamines + metabolites, urine  Cutoff 1000 ng/mL MDMA (Ecstasy), urine              Cutoff 500 ng/mL Cocaine Metabolite, urine          Cutoff  300 ng/mL Opiate + metabolites, urine         Cutoff 300 ng/mL Phencyclidine (PCP), urine         Cutoff 25 ng/mL Cannabinoid, urine                 Cutoff 50 ng/mL Barbiturates + metabolites, urine  Cutoff 200 ng/mL Benzodiazepine, urine              Cutoff 200 ng/mL Methadone, urine                   Cutoff 300 ng/mL  The urine drug screen provides only a preliminary, unconfirmed analytical test result and should not be used for non-medical purposes. Clinical consideration and professional judgment should be applied to any positive drug screen result due to possible interfering substances. A more specific alternate chemical method must be used in order to obtain a confirmed analytical result. Gas chromatography / mass spectrometry (GC/MS) is the preferred confirm atory method. Performed at [facility], [address] [place], [place] [address]     No current facility-administered medications for this encounter.   Current Outpatient Medications  Medication Sig Dispense Refill   citalopram (CELEXA) 40 MG tablet Take 40 mg by mouth daily.     divalproex (DEPAKOTE) 250 MG DR tablet Take 750 mg by mouth at bedtime.     lithium carbonate 300 MG capsule Take 300 mg by mouth 2 (two) times daily with a meal.     omeprazole (PRILOSEC) 20 MG capsule Take 20 mg by mouth 2 (two) times daily before a meal.     promethazine (PHENERGAN) 25 MG tablet Take 25 mg by mouth at bedtime.     propranolol (INDERAL) 10 MG tablet Take 10 mg by mouth daily.     traZODone (DESYREL) 150 MG tablet Take 150 mg by mouth at bedtime.     triamterene-hydrochlorothiazide (DYAZIDE) 37.5-25 MG capsule Take 1 capsule by mouth daily.      Musculoskeletal: Strength & Muscle Tone: within normal limits Gait & Station:  Did not assess  Patient leans: N/A            Psychiatric Specialty Exam:  Presentation  General Appearance:  Disheveled  Eye Contact: Good  Speech: Clear and Coherent  Speech  Volume: Normal  Handedness: Right   Mood and Affect  Mood: Euthymic  Affect: Congruent   Thought Process  Thought Processes: Coherent  Descriptions of Associations:Intact  Orientation:Full (Time, Place and Person)  Thought Content:WDL  History of Schizophrenia/Schizoaffective disorder:No  Duration of Psychotic Symptoms:No data recorded Hallucinations:Hallucinations: None  Ideas of Reference:None  Suicidal Thoughts:Suicidal Thoughts: No  Homicidal Thoughts:Homicidal Thoughts: Yes, Passive HI Passive Intent and/or Plan: Without Plan   Sensorium  Memory: Immediate Good; Recent Fair  Judgment: Fair  Insight: Fair   Chartered certified accountant: Fair  Attention Span: Good  Recall: [employer] of Knowledge: Fair  Language: Fair   Psychomotor Activity  Psychomotor Activity:Psychomotor Activity: Normal   Assets  Assets: Housing; Resilience; Physical Health   Sleep  Sleep:Sleep: Fair   Physical Exam: Physical Exam Vitals and nursing note reviewed.  HENT:     Head: Normocephalic.     Nose: Nose normal.  Cardiovascular:     Comments: Blood Pressure 148/104 Heart Rate 59 Musculoskeletal:        General: Normal range of motion.     Cervical back: Normal range of motion.  Neurological:     Mental Status: He is  alert and oriented to person, place, and time.  Psychiatric:        Attention and Perception: Attention normal. He does not perceive auditory or visual hallucinations.        Mood and Affect: Affect is flat.        Speech: Speech normal.        Behavior: Behavior is cooperative.        Thought Content: Thought content is not paranoid. Thought content includes homicidal ideation.        Cognition and Memory: Cognition and memory normal.    Review of Systems  Respiratory:  Negative for shortness of breath.   Psychiatric/Behavioral:  Positive for hallucinations (Passive) and substance abuse.   All other systems  reviewed and are negative.  Blood pressure (!) 148/104, pulse (!) 59, temperature 97.7 F (36.5 C), temperature source Oral, resp. rate 18, SpO2 98%. There is no height or weight on file to calculate BMI.  Treatment Plan Summary: Plan :   47 year old male with a history of alcohol abuse, anxiety, and cocaine use, presented to the emergency department with right arm and leg tingling, dizziness, occasional central chest pain, blurry vision, and increased difficulty breathing when lying flat. He reports non-compliance with medications and daily marijuana use. He also endorses vague homicidal ideation without a specific plan.The patient denies suicidal or self-harm ideations. Presently, there is no indication of psychiatric impairment, nor is he an imminent risk to himself or others. He does not meet criteria for involuntary commitment at this time.    - Follow-up Appointments: Please schedule a follow-up visit with your primary care provider (PCP) to review and possibly adjust your current medications. - Medication Compliance: It is important to continue taking your prescribed medications as directed by your PCP. Discuss any ineffectiveness with your PCP - Substance Use: Consider engaging in a program to address your daily marijuana use, occasional alcohol consumption, and tobacco use. - Mental Health Support: Continue to utilize your support system and consider regular counseling sessions to help manage your bipolar disorder and other mental health concerns. - Lifestyle Adjustments: Seek stable housing arrangements to improve your living conditions. This can significantly impact your overall well-being. - Safety and Well-being: If feelings of harm towards others or yourself arise, it is crucial to seek immediate help from a healthcare professional or emergency services.    Disposition: No evidence of imminent risk to self or others at present.   Patient does not meet criteria for psychiatric  inpatient admission. Supportive therapy provided about ongoing stressors. Discussed crisis plan, support from social network, calling 911, coming to the Emergency Department, and calling Suicide Hotline.  Norma Fredrickson, NP 05/05/2023 6:48 PM

## 2023-05-04 NOTE — ED Notes (Signed)
vol/psych consult ordered/pending/reassess in the am.

## 2023-05-04 NOTE — BH Assessment (Signed)
Psych Team met with patient for reassessment. Patient reports he stays at [facility] and was feeling like he wanted to hurt his neighbor, who he later stated does not live at the hotel. Patient reports he needs assistance with housing. Patient states he is not currently working, trying to get disability and not taking any medications. Patient reports recent substance use of marijuana and alcohol on yesterday. Patient denies current SI/HI/AH/VH.  Per Christal, NP, patient does not meet criteria for inpatient psychiatric admission. Patient was provided resources.

## 2023-05-04 NOTE — ED Provider Notes (Signed)
Per Margaretha Glassing (Psych NP) "Patient does not meet criteria for inpatient psychiatric hospitalization at this time."  Patient alert, oriented.  He was resting comfortably easily aroused to voice.  Advised patient on recommendation to follow-up with outpatient resources, he is agreeable.  He does request to see if we can arrange a ride home for him, I advised him that he would have to call a friend or potentially hire a taxi.  Return precautions and treatment recommendations and follow-up discussed with the patient who is agreeable with the plan.      Sharyn Creamer, MD 05/04/23 [phone]

## 2023-05-04 NOTE — ED Notes (Addendum)
Pt refusing last set of discharge vitals. Informed patient of the educational materials attached to paperwork. Provided patient with a bus pass. Pt ambulatory from department. ED MD at bedside prior to discharge. Belongings given to patient.

## 2023-05-04 NOTE — Consult Note (Incomplete)
Telepsych Consultation   Reason for Consult:  *** Referring Physician:  *** Location of Patient:  Location of Provider: { [facility] Provider Location:[id number]}  Patient Identification: Alex [name] MRN:  [id number] Principal Diagnosis: Malingering Diagnosis:  Principal Problem:   Malingering Active Problems:   Alcohol dependence with unspecified alcohol-induced disorder (HCC)   Cocaine use disorder, moderate, dependence (HCC)   Depression   Tobacco use disorder   Moderate recurrent major depression (HCC)   Neuropathy   Total Time spent with patient: {Time; 15 min - 8 hours:17441}  Subjective:   Alex [name] is a 47 y.o. male patient admitted with ***.  HPI:   Patient seen via telepsych by this provider; chart reviewed and consulted with Dr. Lucianne Muss on 05/04/23.  On evaluation [name] reports    During evaluation [name] is ***(position); ***he/she is alert/oriented x 4; calm/cooperative; and mood congruent with affect.  Patient is speaking in a clear tone at moderate volume, and normal pace; with good eye contact.  ***His/Her thought process is coherent and relevant; There is no indication that ***he/she is currently responding to internal/external stimuli or experiencing delusional thought content.  Patient denies suicidal/self-harm/homicidal ideation, psychosis, and paranoia.  Patient has remained calm throughout assessment and has answered questions appropriately.     Per ED Provider Admission Assessment    Chief Complaint: Tingling and Psychiatric Evaluation   HPI   Alex [name] is a 47 y.o. male with a past medical history of alcohol abuse, anxiety, cocaine use, seizures, presents emergency department with various complaints.  According to the patient for the last several months or years he has been experiencing intermittent pain and numbness in his right elbow going down to his fingers and sometimes in the left side.  He also states he will sometimes get  numbness and tingling and pain from his right hip down to his right knee but never past his right knee.  He states this has been ongoing for quite some time but he never brings it up.  He also states he is staying at [employer], and if we send it back there tonight he is going to hurt somebody because "he cannot do it anymore".  Patient is asking for something to eat and drink. Spoke with Dr. ***; informed of above recommendation and disposition  Past Psychiatric History: ***  Risk to Self:   Risk to Others:   Prior Inpatient Therapy:   Prior Outpatient Therapy:    Past Medical History:  Past Medical History:  Diagnosis Date  . Alcohol abuse   . Anxiety   . Cocaine abuse (HCC) 05/27/2016  . Depression   . Seizures (HCC)     Past Surgical History:  Procedure Laterality Date  . FINGER SURGERY    . SHOULDER CLOSED REDUCTION Left 03/14/2023   Procedure: Radiological Exam in OR;  Surgeon: Reinaldo Berber, MD;  Location: [facility] ORS;  Service: Orthopedics;  Laterality: Left;   Family History: History reviewed. No pertinent family history. Family Psychiatric  History: *** Social History:  Social History   Substance and Sexual Activity  Alcohol Use Yes  . Alcohol/week: 4.0 - 5.0 standard drinks of alcohol  . Types: 4 - 5 Cans of beer per week     Social History   Substance and Sexual Activity  Drug Use Yes  . Types: Marijuana, Cocaine    Social History   Socioeconomic History  . Marital status: Single    Spouse name: Not on file  .  Number of children: Not on file  . Years of education: Not on file  . Highest education level: Not on file  Occupational History  . Not on file  Tobacco Use  . Smoking status: Every Day    Current packs/day: 1.00    Types: Cigarettes, E-cigarettes  . Smokeless tobacco: Never  Substance and Sexual Activity  . Alcohol use: Yes    Alcohol/week: 4.0 - 5.0 standard drinks of alcohol    Types: 4 - 5 Cans of beer per week  . Drug use: Yes     Types: Marijuana, Cocaine  . Sexual activity: Not Currently  Other Topics Concern  . Not on file  Social History Narrative  . Not on file   Social Determinants of Health   Financial Resource Strain: Not on file  Food Insecurity: Not on file  Transportation Needs: Not on file  Physical Activity: Not on file  Stress: Not on file  Social Connections: Not on file   Additional Social History:    Allergies:   Allergies  Allergen Reactions  . Peanut Butter Flavor     Labs:  Results for orders placed or performed during the hospital encounter of 05/03/23 (from the past 48 hour(s))  CBC     Status: Abnormal   Collection Time: 05/03/23 10:00 PM  Result Value Ref Range   WBC 7.7 4.0 - 10.5 K/uL   RBC 4.31 4.22 - 5.81 MIL/uL   Hemoglobin 12.3 (L) 13.0 - 17.0 g/dL   HCT 16.1 09.6 - 04.5 %   MCV 90.7 80.0 - 100.0 fL   MCH 28.5 26.0 - 34.0 pg   MCHC 31.5 30.0 - 36.0 g/dL   RDW 40.9 (H) 81.1 - 91.4 %   Platelets 232 150 - 400 K/uL   nRBC 0.0 0.0 - 0.2 %    Comment: Performed at [facility], [address] [place], [place] [address]  Comprehensive metabolic panel     Status: Abnormal   Collection Time: 05/03/23 10:00 PM  Result Value Ref Range   Sodium 132 (L) 135 - 145 mmol/L   Potassium 3.6 3.5 - 5.1 mmol/L   Chloride 101 98 - 111 mmol/L   CO2 22 22 - 32 mmol/L   Glucose, Bld 98 70 - 99 mg/dL    Comment: Glucose reference range applies only to samples taken after fasting for at least 8 hours.   BUN 11 6 - 20 mg/dL   Creatinine, Ser 6.21 0.61 - 1.24 mg/dL   Calcium 8.6 (L) 8.9 - 10.3 mg/dL   Total Protein 7.0 6.5 - 8.1 g/dL   Albumin 3.5 3.5 - 5.0 g/dL   AST 17 15 - 41 U/L   ALT 15 0 - 44 U/L   Alkaline Phosphatase 64 38 - 126 U/L   Total Bilirubin 0.3 0.3 - 1.2 mg/dL   GFR, Estimated >30 >86 mL/min    Comment: (NOTE) Calculated using the CKD-EPI Creatinine Equation (2021)    Anion gap 9 5 - 15    Comment: Performed at [facility], [address] [place], [place] [address]  Troponin I (High Sensitivity)     Status: None   Collection Time: 05/03/23 10:00 PM  Result Value Ref Range   Troponin I (High Sensitivity) 3 <18 ng/L    Comment: (NOTE) Elevated high sensitivity troponin I (hsTnI) values and significant  changes across serial measurements may suggest ACS but many other  chronic and acute conditions are known to elevate  hsTnI results.  Refer to the "Links" section for chest pain algorithms and additional  guidance. Performed at [facility], [address] [place], [place] [address]   Ethanol     Status: Abnormal   Collection Time: 05/03/23 10:00 PM  Result Value Ref Range   Alcohol, Ethyl (B) 15 (H) <10 mg/dL    Comment: (NOTE) Lowest detectable limit for serum alcohol is 10 mg/dL.  For medical purposes only. Performed at [facility], [address] [place], [place] [address]   Salicylate level     Status: Abnormal   Collection Time: 05/03/23 10:00 PM  Result Value Ref Range   Salicylate Lvl <7.0 (L) 7.0 - 30.0 mg/dL    Comment: Performed at [facility], [address] [place], [place] [address]  Acetaminophen level     Status: Abnormal   Collection Time: 05/03/23 10:00 PM  Result Value Ref Range   Acetaminophen (Tylenol), Serum <10 (L) 10 - 30 ug/mL    Comment: (NOTE) Therapeutic concentrations vary significantly. A range of 10-30 ug/mL  may be an effective concentration for many patients. However, some  are best treated at concentrations outside of this range. Acetaminophen concentrations >150 ug/mL at 4 hours after ingestion  and >50 ug/mL at 12 hours after ingestion are often associated with  toxic reactions.  Performed at [facility], [address] [place], [place] [address]   Urine Drug Screen, Qualitative     Status: Abnormal   Collection Time: 05/03/23 10:08 PM  Result Value Ref Range   Tricyclic, Ur Screen NONE DETECTED NONE DETECTED    Amphetamines, Ur Screen NONE DETECTED NONE DETECTED   MDMA (Ecstasy)Ur Screen NONE DETECTED NONE DETECTED   Cocaine Metabolite,Ur [place] POSITIVE (A) NONE DETECTED   Opiate, Ur Screen NONE DETECTED NONE DETECTED   Phencyclidine (PCP) Ur S NONE DETECTED NONE DETECTED   Cannabinoid 50 Ng, Ur [place] POSITIVE (A) NONE DETECTED   Barbiturates, Ur Screen NONE DETECTED NONE DETECTED   Benzodiazepine, Ur Scrn NONE DETECTED NONE DETECTED   Methadone Scn, Ur NONE DETECTED NONE DETECTED    Comment: (NOTE) Tricyclics + metabolites, urine    Cutoff 1000 ng/mL Amphetamines + metabolites, urine  Cutoff 1000 ng/mL MDMA (Ecstasy), urine              Cutoff 500 ng/mL Cocaine Metabolite, urine          Cutoff 300 ng/mL Opiate + metabolites, urine        Cutoff 300 ng/mL Phencyclidine (PCP), urine         Cutoff 25 ng/mL Cannabinoid, urine                 Cutoff 50 ng/mL Barbiturates + metabolites, urine  Cutoff 200 ng/mL Benzodiazepine, urine              Cutoff 200 ng/mL Methadone, urine                   Cutoff 300 ng/mL  The urine drug screen provides only a preliminary, unconfirmed analytical test result and should not be used for non-medical purposes. Clinical consideration and professional judgment should be applied to any positive drug screen result due to possible interfering substances. A more specific alternate chemical method must be used in order to obtain a confirmed analytical result. Gas chromatography / mass spectrometry (GC/MS) is the preferred confirm atory method. Performed at [facility], [address], [place], [place] [address]  Medications:  No current facility-administered medications for this encounter.   Current Outpatient Medications  Medication Sig Dispense Refill  . citalopram (CELEXA) 40 MG tablet Take 40 mg by mouth daily.    . divalproex (DEPAKOTE) 250 MG DR tablet Take 750 mg by mouth at bedtime.    Marland Kitchen lithium carbonate 300 MG capsule Take 300 mg by mouth  2 (two) times daily with a meal.    . omeprazole (PRILOSEC) 20 MG capsule Take 20 mg by mouth 2 (two) times daily before a meal.    . promethazine (PHENERGAN) 25 MG tablet Take 25 mg by mouth at bedtime.    . propranolol (INDERAL) 10 MG tablet Take 10 mg by mouth daily.    . traZODone (DESYREL) 150 MG tablet Take 150 mg by mouth at bedtime.    . triamterene-hydrochlorothiazide (DYAZIDE) 37.5-25 MG capsule Take 1 capsule by mouth daily.      Musculoskeletal: Strength & Muscle Tone: {desc; muscle tone:32375} Gait & Station: {PE GAIT ED WUXL:24401} Patient leans: {Patient Leans:21022755}          Psychiatric Specialty Exam:  Presentation  General Appearance:  Disheveled  Eye Contact: Good  Speech: Clear and Coherent  Speech Volume: Normal  Handedness: Right   Mood and Affect  Mood: Euthymic  Affect: Congruent   Thought Process  Thought Processes: Coherent  Descriptions of Associations:Intact  Orientation:Full (Time, Place and Person)  Thought Content:WDL  History of Schizophrenia/Schizoaffective disorder:No  Duration of Psychotic Symptoms:No data recorded Hallucinations:No data recorded Ideas of Reference:None  Suicidal Thoughts:No data recorded Homicidal Thoughts:No data recorded  Sensorium  Memory: Immediate Good; Recent Fair  Judgment: Fair  Insight: Fair   Art therapist  Concentration: Fair  Attention Span: Good  Recall: [employer] of Knowledge: Fair  Language: Fair   Psychomotor Activity  Psychomotor Activity:No data recorded  Assets  Assets: Housing; Resilience; Physical Health   Sleep  Sleep:No data recorded   Physical Exam: Physical Exam ROS Blood pressure (!) 148/104, pulse (!) 59, temperature 97.7 F (36.5 C), temperature source Oral, resp. rate 18, SpO2 98%. There is no height or weight on file to calculate BMI.  Treatment Plan Summary: {CHL [facility] MD [place] UUVO:[id number]}  Disposition: {CHL  [facility] Consult Plan:20772}  This service was provided via telemedicine using a 2-way, interactive audio and video technology.  Names of all persons participating in this telemedicine service and their role in this encounter. Name: *** Role: ***  Name: *** Role: ***  Name: *** Role: ***  Name: *** Role: ***    Chales Abrahams, NP 05/04/2023 10:43 AM

## 2023-05-04 NOTE — ED Provider Notes (Signed)
Emergency Medicine Observation Re-evaluation Note  JAETHAN DUEL is a 47 y.o. male, seen on rounds today.  Pt initially presented to the ED for complaints of Tingling and Psychiatric Evaluation Currently, the patient is resting, voices no medical complaints.  Physical Exam  BP 107/67   Pulse 71   Temp 98.3 F (36.8 C) (Oral)   Resp 16   SpO2 94%  Physical Exam General: Resting in no acute distress Cardiac: No cyanosis Lungs: Equal rise and fall Psych: Not agitated  ED Course / MDM  EKG:   I have reviewed the labs performed to date as well as medications administered while in observation.  Recent changes in the last 24 hours include no events overnight.  Plan  Current plan is for psychiatric disposition.    Irean Hong, MD 05/04/23 [phone]
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
## 2023-05-04 NOTE — ED Notes (Signed)
Pt resting calm cooperative , no complaints this am

## 2023-05-04 NOTE — Discharge Instructions (Signed)

## 2023-05-04 NOTE — ED Notes (Signed)
Pt had his tts consult and recommendation was reassessment in the morning

## 2023-05-06 ENCOUNTER — Emergency Department
Admission: EM | Admit: 2023-05-06 | Discharge: 2023-05-07 | Disposition: A | Discharge status: Home / Self Care | Payer: MEDICAID | Attending: Emergency Medicine | Admitting: Emergency Medicine

## 2023-05-06 ENCOUNTER — Other Ambulatory Visit: Payer: Self-pay

## 2023-05-06 ENCOUNTER — Emergency Department: Payer: MEDICAID

## 2023-05-06 DIAGNOSIS — S43015A Anterior dislocation of left humerus, initial encounter: Secondary | ICD-10-CM | POA: Insufficient documentation

## 2023-05-06 DIAGNOSIS — F1090 Alcohol use, unspecified, uncomplicated: Secondary | ICD-10-CM | POA: Insufficient documentation

## 2023-05-06 DIAGNOSIS — Y908 Blood alcohol level of 240 mg/100 ml or more: Secondary | ICD-10-CM | POA: Insufficient documentation

## 2023-05-06 DIAGNOSIS — F10920 Alcohol use, unspecified with intoxication, uncomplicated: Secondary | ICD-10-CM

## 2023-05-06 DIAGNOSIS — S4992XA Unspecified injury of left shoulder and upper arm, initial encounter: Secondary | ICD-10-CM | POA: Diagnosis present

## 2023-05-06 DIAGNOSIS — X501XXA Overexertion from prolonged static or awkward postures, initial encounter: Secondary | ICD-10-CM | POA: Insufficient documentation

## 2023-05-06 LAB — COMPREHENSIVE METABOLIC PANEL
ALT: 16 U/L (ref 0–44)
AST: 21 U/L (ref 15–41)
Albumin: 3.9 g/dL (ref 3.5–5.0)
Alkaline Phosphatase: 81 U/L (ref 38–126)
Anion gap: 13 (ref 5–15)
BUN: 9 mg/dL (ref 6–20)
CO2: 19 mmol/L — ABNORMAL LOW (ref 22–32)
Calcium: 8.6 mg/dL — ABNORMAL LOW (ref 8.9–10.3)
Chloride: 100 mmol/L (ref 98–111)
Creatinine, Ser: 0.74 mg/dL (ref 0.61–1.24)
GFR, Estimated: >60 mL/min (ref >60)
Glucose, Bld: 90 mg/dL (ref 70–99)
Potassium: 4 mmol/L (ref 3.5–5.1)
Sodium: 132 mmol/L — ABNORMAL LOW (ref 135–145)
Total Bilirubin: 0.8 mg/dL (ref 0.3–1.2)
Total Protein: 7.7 g/dL (ref 6.5–8.1)

## 2023-05-06 LAB — CBC
HCT: 42.2 % (ref 39.0–52.0)
Hemoglobin: 13.4 g/dL (ref 13.0–17.0)
MCH: 28.1 pg (ref 26.0–34.0)
MCHC: 31.8 g/dL (ref 30.0–36.0)
MCV: 88.5 fL (ref 80.0–100.0)
Platelets: 244 10*3/uL (ref 150–400)
RBC: 4.77 MIL/uL (ref 4.22–5.81)
RDW: 16.9 % — ABNORMAL HIGH (ref 11.5–15.5)
WBC: 8.3 10*3/uL (ref 4.0–10.5)
nRBC: 0 % (ref 0.0–0.2)

## 2023-05-06 LAB — ETHANOL: Alcohol, Ethyl (B): 246 mg/dL — ABNORMAL HIGH (ref <10)

## 2023-05-06 MED ORDER — PROPOFOL 10 MG/ML IV BOLUS
0.5000 mg/kg | Freq: Once | INTRAVENOUS | Status: AC
  Administered 2023-05-07: 100 mg via INTRAVENOUS
  Filled 2023-05-06: qty 20

## 2023-05-06 MED ORDER — LIDOCAINE HCL (PF) 1 % IJ SOLN
10.0000 mL | Freq: Once | INTRAMUSCULAR | Status: AC
  Administered 2023-05-07: 10 mL
  Filled 2023-05-06: qty 10

## 2023-05-06 NOTE — ED Triage Notes (Addendum)
Pt states he lifted his arms and his left shoulder dislocated, pt also reports passing out twice. Pt is AOX4, NAD noted. Left shoulder does not appears obviously dislocated. Pt smells of ETOH, states he drank 2 beers today.

## 2023-05-07 ENCOUNTER — Emergency Department: Payer: MEDICAID

## 2023-05-07 MED ORDER — IBUPROFEN 600 MG PO TABS
600.0000 mg | ORAL_TABLET | Freq: Once | ORAL | Status: AC
  Administered 2023-05-07: 600 mg via ORAL
  Filled 2023-05-07: qty 1

## 2023-05-07 MED ORDER — MELATONIN 5 MG PO TABS
2.5000 mg | ORAL_TABLET | Freq: Every day | ORAL | Status: DC
  Administered 2023-05-07: 2.5 mg via ORAL
  Filled 2023-05-07: qty 1

## 2023-05-07 MED ORDER — MELATONIN 3 MG PO TABS: 3.0000 mg | ORAL_TABLET | Freq: Every day | ORAL | Status: DC

## 2023-05-07 MED ORDER — ACETAMINOPHEN 500 MG PO TABS
1000.0000 mg | ORAL_TABLET | Freq: Once | ORAL | Status: AC
  Administered 2023-05-07: 1000 mg via ORAL
  Filled 2023-05-07: qty 2

## 2023-05-07 NOTE — Group Note (Deleted)
Date:  05/07/2023 Time:  2:12 AM  Group Topic/Focus:  Wrap-Up Group:   The focus of this group is to help patients review their daily goal of treatment and discuss progress on daily workbooks.     Participation Level:  {BHH PARTICIPATION ZOXWR:60454}  Participation Quality:  {BHH PARTICIPATION QUALITY:22265}  Affect:  {BHH AFFECT:22266}  Cognitive:  {BHH COGNITIVE:22267}  Insight: {BHH Insight2:20797}  Engagement in Group:  {BHH ENGAGEMENT IN UJWJX:91478}  Modes of Intervention:  {BHH MODES OF INTERVENTION:22269}  Additional Comments:  ***  Maglione,Seward Coran E 05/07/2023, 2:12 AM

## 2023-05-07 NOTE — ED Notes (Signed)
Patient is awake and talking. Patient is requesting food at this time. Provider notified.

## 2023-05-07 NOTE — ED Notes (Signed)
Pt A&O x4, no obvious distress noted, respirations regular/unlabored. Pt verbalizes understanding of discharge instructions. Pt able to ambulate from ED independently.   

## 2023-05-07 NOTE — ED Provider Notes (Signed)
[facility] Provider Note    Event Date/Time   First MD Initiated Contact with Patient 05/06/23 2301     (approximate)   History   Shoulder Injury   HPI  Alex [name] is a 47 y.o. male   Past medical history of notable shoulder dislocations, substance use, depression who presents emergency department with a shoulder dislocation on the left side.  Recurrent.  Was lifting his arms earlier this afternoon on 2 PM when it became dislocated.  He tried to wait it out but could not reduce it came in tonight for reduction.  He has drank alcohol today and smoked marijuana but no other drug use.  He has no other acute medical complaints.   External Medical Documents Reviewed: OR note from Dr. Audelia Acton earlier this year for shoulder dislocation       Physical Exam   Triage Vital Signs: ED Triage Vitals  Encounter Vitals Group     BP 05/06/23 2148 120/87     Systolic BP Percentile --      Diastolic BP Percentile --      Pulse Rate 05/06/23 2148 73     Resp 05/06/23 2148 18     Temp 05/06/23 2148 98.2 F (36.8 C)     Temp Source 05/06/23 2148 Oral     SpO2 05/06/23 2148 98 %     Weight --      Height --      Head Circumference --      Peak Flow --      Pain Score 05/06/23 2150 9     Pain Loc --      Pain Education --      Exclude from Growth Chart --     Most recent vital signs: Vitals:   05/07/23 0020 05/07/23 0022  BP:  128/76  Pulse:  79  Resp:  16  Temp: 97.8 F (36.6 C)   SpO2:  96%    General: Awake, no distress.  CV:  Good peripheral perfusion.  Resp:  Normal effort.  Abd:  No distention.  Other:  Squared off deformity to the left shoulder, neurovascular intact   ED Results / Procedures / Treatments   Labs (all labs ordered are listed, but only abnormal results are displayed) Labs Reviewed  COMPREHENSIVE METABOLIC PANEL - Abnormal; Notable for the following components:      Result Value   Sodium 132 (*)    CO2 19 (*)     Calcium 8.6 (*)    All other components within normal limits  ETHANOL - Abnormal; Notable for the following components:   Alcohol, Ethyl (B) 246 (*)    All other components within normal limits  CBC - Abnormal; Notable for the following components:   RDW 16.9 (*)    All other components within normal limits     I ordered and reviewed the above labs they are notable for etoh levels 240  EKG  ED ECG REPORT I, Pilar Jarvis, the attending physician, personally viewed and interpreted this ECG.   Date: 05/07/2023  EKG Time: 2155  Rate: 73  Rhythm: nsr  Axis: lad  Intervals:none  ST&T Change: no stemi    RADIOLOGY I independently reviewed and interpreted x-ray of the left shoulder and seen anterior dislocation I also reviewed radiologist's formal read.   PROCEDURES:  Critical Care performed: No  .Sedation  Date/Time: 05/07/2023 12:27 AM  Performed by: Pilar Jarvis, MD Authorized by: Pilar Jarvis, MD  Consent:    Consent obtained:  Verbal   Consent given by:  Patient   Risks discussed:  Allergic reaction, dysrhythmia, vomiting, respiratory compromise necessitating ventilatory assistance and intubation, prolonged sedation necessitating reversal and prolonged hypoxia resulting in organ damage   Alternatives discussed:  Analgesia without sedation Universal protocol:    Immediately prior to procedure, a time out was called: yes   Indications:    Procedure performed:  Dislocation reduction   Procedure necessitating sedation performed by:  Different physician Pre-sedation assessment:    Time since last food or drink:  Morning   ASA classification: class 2 - patient with mild systemic disease     Mallampati score:  I - soft palate, uvula, fauces, pillars visible   Neck mobility: normal     Pre-sedation assessments completed and reviewed: airway patency, mental status and pain level   Immediate pre-procedure details:    Reassessment: Patient reassessed immediately prior to  procedure     Reviewed: vital signs     Verified: bag valve mask available, emergency equipment available, intubation equipment available and IV patency confirmed   Procedure details (see MAR for exact dosages):    Preoxygenation:  Nasal cannula   Sedation:  Propofol   Intended level of sedation: moderate (conscious sedation)   Analgesia:  None   Intra-procedure monitoring:  Blood pressure monitoring and cardiac monitor   Intra-procedure events: none     Total Provider sedation time (minutes):  25 Post-procedure details:    Attendance: Constant attendance by certified staff until patient recovered     Recovery: Patient returned to pre-procedure baseline     Post-sedation assessments completed and reviewed: airway patency and pain level     Procedure completion:  Tolerated well, no immediate complications    MEDICATIONS ORDERED IN ED: Medications  lidocaine (PF) (XYLOCAINE) 1 % injection 10 mL (has no administration in time range)  propofol (DIPRIVAN) 10 mg/mL bolus/IV push 55.8 mg (100 mg Intravenous Given 05/07/23 0018)     IMPRESSION / MDM / ASSESSMENT AND PLAN / ED COURSE  I reviewed the triage vital signs and the nursing notes.                                Patient's presentation is most consistent with acute presentation with potential threat to life or bodily function.  Differential diagnosis includes, but is not limited to, anterior shoulder dislocation, recurrent.   The patient is on the cardiac monitor to evaluate for evidence of arrhythmia and/or significant heart rate changes.  MDM: Patient with anterior shoulder dislocation with significant pain, intoxication.    Patient with significant pain, becomes agitated when approaching the left shoulder, history of difficult reduction in the past requiring sedation in the emergency department and ultimately OR for reduction.  Will require sedation today.  Sedated with 100 mg propofol with good effect and reduction was  performed by Dr. York Cerise, repeat x-ray pending,  Will await sobriety prior to discharge.       FINAL CLINICAL IMPRESSION(S) / ED DIAGNOSES   Final diagnoses:  Anterior dislocation of left shoulder, initial encounter     Rx / DC Orders   ED Discharge Orders     None        Note:  This document was prepared using Dragon voice recognition software and may include unintentional dictation errors.    Pilar Jarvis, MD 05/07/23 [place] Bruns

## 2023-05-07 NOTE — ED Provider Notes (Signed)
-----------------------------------------   12:25 AM on 05/07/2023 -----------------------------------------  .Ortho Injury Treatment  Date/Time: 05/07/2023 12:25 AM  Performed by: Loleta Rose, MD Authorized by: Loleta Rose, MD   Consent:    Consent obtained:  Emergent situation   Consent given by:  Patient   Risks discussed:  Recurrent dislocation, fracture and restricted joint movementInjury location: shoulder Location details: left shoulder Injury type: dislocation Dislocation type: anterior Chronicity: recurrent Pre-procedure neurovascular assessment: neurovascularly intact Pre-procedure distal perfusion: normal Pre-procedure neurological function: normal Pre-procedure range of motion: reduced  Patient sedated: Yes. Refer to sedation procedure documentation for details of sedation. Manipulation performed: yes Reduction method: Milch technique Reduction successful: yes X-ray confirmed reduction: yes Immobilization: sling Splint Applied by: ED Nurse and ED Provider Post-procedure neurovascular assessment: post-procedure neurovascularly intact Post-procedure distal perfusion: normal Post-procedure neurological function: normal Post-procedure range of motion: improved       Loleta Rose, MD 05/07/23 [phone]

## 2023-05-11 ENCOUNTER — Emergency Department
Admission: EM | Admit: 2023-05-11 | Discharge: 2023-05-11 | Disposition: A | Discharge status: Home / Self Care | Payer: MEDICAID | Attending: Emergency Medicine | Admitting: Emergency Medicine

## 2023-05-11 ENCOUNTER — Other Ambulatory Visit: Payer: Self-pay

## 2023-05-11 DIAGNOSIS — F1010 Alcohol abuse, uncomplicated: Secondary | ICD-10-CM | POA: Diagnosis not present

## 2023-05-11 DIAGNOSIS — F909 Attention-deficit hyperactivity disorder, unspecified type: Secondary | ICD-10-CM | POA: Diagnosis not present

## 2023-05-11 DIAGNOSIS — Z720 Tobacco use: Secondary | ICD-10-CM

## 2023-05-11 DIAGNOSIS — Y906 Blood alcohol level of 120-199 mg/100 ml: Secondary | ICD-10-CM | POA: Insufficient documentation

## 2023-05-11 DIAGNOSIS — F17210 Nicotine dependence, cigarettes, uncomplicated: Secondary | ICD-10-CM | POA: Diagnosis not present

## 2023-05-11 DIAGNOSIS — F17200 Nicotine dependence, unspecified, uncomplicated: Secondary | ICD-10-CM | POA: Diagnosis present

## 2023-05-11 DIAGNOSIS — L03311 Cellulitis of abdominal wall: Secondary | ICD-10-CM

## 2023-05-11 DIAGNOSIS — F10129 Alcohol abuse with intoxication, unspecified: Secondary | ICD-10-CM

## 2023-05-11 DIAGNOSIS — R45851 Suicidal ideations: Secondary | ICD-10-CM

## 2023-05-11 DIAGNOSIS — F1994 Other psychoactive substance use, unspecified with psychoactive substance-induced mood disorder: Secondary | ICD-10-CM | POA: Diagnosis not present

## 2023-05-11 DIAGNOSIS — F32A Depression, unspecified: Secondary | ICD-10-CM | POA: Diagnosis present

## 2023-05-11 LAB — COMPREHENSIVE METABOLIC PANEL
ALT: 34 U/L (ref 0–44)
AST: 43 U/L — ABNORMAL HIGH (ref 15–41)
Albumin: 3.5 g/dL (ref 3.5–5.0)
Alkaline Phosphatase: 75 U/L (ref 38–126)
Anion gap: 10 (ref 5–15)
BUN: 12 mg/dL (ref 6–20)
CO2: 23 mmol/L (ref 22–32)
Calcium: 8.9 mg/dL (ref 8.9–10.3)
Chloride: 102 mmol/L (ref 98–111)
Creatinine, Ser: 0.72 mg/dL (ref 0.61–1.24)
GFR, Estimated: >60 mL/min (ref >60)
Glucose, Bld: 118 mg/dL — ABNORMAL HIGH (ref 70–99)
Potassium: 3.7 mmol/L (ref 3.5–5.1)
Sodium: 135 mmol/L (ref 135–145)
Total Bilirubin: 0.5 mg/dL (ref 0.3–1.2)
Total Protein: 7.1 g/dL (ref 6.5–8.1)

## 2023-05-11 LAB — CBC WITH DIFFERENTIAL/PLATELET
Abs Immature Granulocytes: 0.02 10*3/uL (ref 0.00–0.07)
Basophils Absolute: 0 10*3/uL (ref 0.0–0.1)
Basophils Relative: 0 %
Eosinophils Absolute: 0.3 10*3/uL (ref 0.0–0.5)
Eosinophils Relative: 5 %
HCT: 39.1 % (ref 39.0–52.0)
Hemoglobin: 12.3 g/dL — ABNORMAL LOW (ref 13.0–17.0)
Immature Granulocytes: 0 %
Lymphocytes Relative: 31 %
Lymphs Abs: 1.8 10*3/uL (ref 0.7–4.0)
MCH: 28.1 pg (ref 26.0–34.0)
MCHC: 31.5 g/dL (ref 30.0–36.0)
MCV: 89.3 fL (ref 80.0–100.0)
Monocytes Absolute: 0.8 10*3/uL (ref 0.1–1.0)
Monocytes Relative: 13 %
Neutro Abs: 3 10*3/uL (ref 1.7–7.7)
Neutrophils Relative %: 51 %
Platelets: 190 10*3/uL (ref 150–400)
RBC: 4.38 MIL/uL (ref 4.22–5.81)
RDW: 16.1 % — ABNORMAL HIGH (ref 11.5–15.5)
WBC: 5.9 10*3/uL (ref 4.0–10.5)
nRBC: 0 % (ref 0.0–0.2)

## 2023-05-11 LAB — LITHIUM LEVEL: Lithium Lvl: 0.21 mmol/L — ABNORMAL LOW (ref 0.60–1.20)

## 2023-05-11 LAB — ETHANOL: Alcohol, Ethyl (B): 130 mg/dL — ABNORMAL HIGH (ref <10)

## 2023-05-11 LAB — MAGNESIUM: Magnesium: 2.1 mg/dL (ref 1.7–2.4)

## 2023-05-11 LAB — VALPROIC ACID LEVEL: Valproic Acid Lvl: 61 ug/mL (ref 50.0–100.0)

## 2023-05-11 MED ORDER — CEPHALEXIN 500 MG PO CAPS: 500.0000 mg | ORAL_CAPSULE | Freq: Four times a day (QID) | ORAL | 0 refills | Status: DC

## 2023-05-11 MED ORDER — CEPHALEXIN 500 MG PO CAPS
500.0000 mg | ORAL_CAPSULE | Freq: Four times a day (QID) | ORAL | Status: DC
  Administered 2023-05-11: 500 mg via ORAL
  Filled 2023-05-11: qty 1

## 2023-05-11 MED ORDER — CEPHALEXIN 500 MG PO CAPS: 500.0000 mg | ORAL_CAPSULE | Freq: Four times a day (QID) | ORAL | 0 refills | Status: AC

## 2023-05-11 NOTE — Discharge Instructions (Signed)
Return to the ER for new or worsening symptoms. °

## 2023-05-11 NOTE — ED Notes (Signed)
Patient dressed out into hospital scrubs at this time with this RN and EDT Zachery Dakins as witness. Pt belongings removed from room and placed in hospital safe location.  Patient belongings as follows:  1 pair tennis shoes 1 pair black socks  1 pair pants  1 shirt 1 pair underwear

## 2023-05-11 NOTE — Consult Note (Signed)
Telepsych Consultation   Reason for Consult:  Psych evalaution Referring Physician:  Dr. Elesa Massed Location of Patient: [facility] ER Location of Provider: Other: Remote ER  Patient Identification: Alex [name] MRN:  [id number] Principal Diagnosis: <principal problem not specified> Diagnosis:  Active Problems:   Depression   Tobacco use disorder   ADHD   Substance induced mood disorder (HCC)   Alcohol abuse   Total Time spent with patient: 30 minutes  Subjective:   " I have a lot going on"  HPI:   Alex [name], 47 y.o., male patient seen via tele health by TTS and this provider; chart reviewed and consulted with Dr. Elesa Massed on 05/11/23.  On evaluation Alex [name] reports that he has been dealing with life's stressors and he feels like he cannot take it anymore.  [facility].  This patient is endorsing HI. Reports wanting to hurt people, not specific to who.  While in the ER, he has demonstrated no unsafe behavior, but admitted to drinking two 40 ounces of beer today.  Patient appears intoxicated.  At this time there  is no evidence of a psychiatric condition which is amendable to inpatient psychiatric hospitalization.  Recommend reassessment in the am.  Patients complaints usually resolves once he has rested and sobered up. Patient's HI appears to be conditional and is a means of manipulation and attention seeking without a true intention to hurt anyone. His behavior is more consistent with someone who is acting in self-preservation, rather than someone who is acutely homicidal.   During evaluation Alex [name] i is alert/oriented x 4; calm/cooperative; and mood congruent with affect.  Patient is speaking in a clear tone at moderate volume, and normal pace; with good eye contact.  His thought process is coherent and relevant; There is no indication that he is currently responding to internal/external stimuli or experiencing delusional thought content.  Patient  endorses homicidal ideation, due to "ifes stressors".  No evidence of psychosis, and paranoia.  Patient has remained calm throughout assessment and has answered questions appropriately.     Recommendations:  Reassessment in the AM  Dr. Elesa Massed informed of above recommendation and disposition  Past Psychiatric History:   Depression   Tobacco use disorder   ADHD   Substance induced mood disorder (HCC)   Alcohol abuse  Risk to Self:   Risk to Others:   Prior Inpatient Therapy:   Prior Outpatient Therapy:    Past Medical History:  Past Medical History:  Diagnosis Date   Alcohol abuse    Anxiety    Cocaine abuse (HCC) 05/27/2016   Depression    Seizures (HCC)     Past Surgical History:  Procedure Laterality Date   FINGER SURGERY     SHOULDER CLOSED REDUCTION Left 03/14/2023   Procedure: Radiological Exam in OR;  Surgeon: Reinaldo Berber, MD;  Location: [facility] ORS;  Service: Orthopedics;  Laterality: Left;   Family History: History reviewed. No pertinent family history. Family Psychiatric  History: unknown Social History:  Social History   Substance and Sexual Activity  Alcohol Use Yes   Alcohol/week: 4.0 - 5.0 standard drinks of alcohol   Types: 4 - 5 Cans of beer per week     Social History   Substance and Sexual Activity  Drug Use Yes   Types: Marijuana, Cocaine    Social History   Socioeconomic History   Marital status: Single    Spouse name: Not on file   Number  of children: Not on file   Years of education: Not on file   Highest education level: Not on file  Occupational History   Not on file  Tobacco Use   Smoking status: Every Day    Current packs/day: 1.00    Types: Cigarettes, E-cigarettes   Smokeless tobacco: Never  Substance and Sexual Activity   Alcohol use: Yes    Alcohol/week: 4.0 - 5.0 standard drinks of alcohol    Types: 4 - 5 Cans of beer per week   Drug use: Yes    Types: Marijuana, Cocaine   Sexual activity: Not Currently  Other Topics  Concern   Not on file  Social History Narrative   Not on file   Social Determinants of Health   Financial Resource Strain: Not on file  Food Insecurity: Not on file  Transportation Needs: Not on file  Physical Activity: Not on file  Stress: Not on file  Social Connections: Not on file   Additional Social History:    Allergies:   Allergies  Allergen Reactions   Other Itching    Peanut Butter   Peanut Butter Flavor     Other Reaction(s): Unknown    Labs:  Results for orders placed or performed during the hospital encounter of 05/11/23 (from the past 48 hour(s))  Comprehensive metabolic panel     Status: Abnormal   Collection Time: 05/11/23  3:43 AM  Result Value Ref Range   Sodium 135 135 - 145 mmol/L   Potassium 3.7 3.5 - 5.1 mmol/L   Chloride 102 98 - 111 mmol/L   CO2 23 22 - 32 mmol/L   Glucose, Bld 118 (H) 70 - 99 mg/dL    Comment: Glucose reference range applies only to samples taken after fasting for at least 8 hours.   BUN 12 6 - 20 mg/dL   Creatinine, Ser 1.19 0.61 - 1.24 mg/dL   Calcium 8.9 8.9 - 14.7 mg/dL   Total Protein 7.1 6.5 - 8.1 g/dL   Albumin 3.5 3.5 - 5.0 g/dL   AST 43 (H) 15 - 41 U/L   ALT 34 0 - 44 U/L   Alkaline Phosphatase 75 38 - 126 U/L   Total Bilirubin 0.5 0.3 - 1.2 mg/dL   GFR, Estimated >82 >95 mL/min    Comment: (NOTE) Calculated using the CKD-EPI Creatinine Equation (2021)    Anion gap 10 5 - 15    Comment: Performed at [facility], [address] [place], [place] [address]  CBC with Diff     Status: Abnormal   Collection Time: 05/11/23  3:43 AM  Result Value Ref Range   WBC 5.9 4.0 - 10.5 K/uL   RBC 4.38 4.22 - 5.81 MIL/uL   Hemoglobin 12.3 (L) 13.0 - 17.0 g/dL   HCT 86.5 78.4 - 69.6 %   MCV 89.3 80.0 - 100.0 fL   MCH 28.1 26.0 - 34.0 pg   MCHC 31.5 30.0 - 36.0 g/dL   RDW 29.5 (H) 28.4 - 13.2 %   Platelets 190 150 - 400 K/uL   nRBC 0.0 0.0 - 0.2 %   Neutrophils Relative % 51 %   Neutro Abs 3.0 1.7 - 7.7 K/uL    Lymphocytes Relative 31 %   Lymphs Abs 1.8 0.7 - 4.0 K/uL   Monocytes Relative 13 %   Monocytes Absolute 0.8 0.1 - 1.0 K/uL   Eosinophils Relative 5 %   Eosinophils Absolute 0.3 0.0 - 0.5 K/uL   Basophils  Relative 0 %   Basophils Absolute 0.0 0.0 - 0.1 K/uL   Immature Granulocytes 0 %   Abs Immature Granulocytes 0.02 0.00 - 0.07 K/uL    Comment: Performed at [facility], [address] [place], [place] [address]  Magnesium     Status: None   Collection Time: 05/11/23  3:43 AM  Result Value Ref Range   Magnesium 2.1 1.7 - 2.4 mg/dL    Comment: Performed at [facility], [address] [place], [place] [address]    Medications:  Current Facility-Administered Medications  Medication Dose Route Frequency Provider Last Rate Last Admin   cephALEXin (KEFLEX) capsule 500 mg  500 mg Oral Q6H Ward, Kristen N, DO   500 mg at 05/11/23 7035   Current Outpatient Medications  Medication Sig Dispense Refill   cephALEXin (KEFLEX) 500 MG capsule Take 1 capsule (500 mg total) by mouth 4 (four) times daily for 7 days. 28 capsule 0   citalopram (CELEXA) 40 MG tablet Take 40 mg by mouth daily.     divalproex (DEPAKOTE) 250 MG DR tablet Take 750 mg by mouth at bedtime.     lithium carbonate 300 MG capsule Take 300 mg by mouth 2 (two) times daily with a meal.     omeprazole (PRILOSEC) 20 MG capsule Take 20 mg by mouth 2 (two) times daily before a meal.     promethazine (PHENERGAN) 25 MG tablet Take 25 mg by mouth at bedtime.     propranolol (INDERAL) 10 MG tablet Take 10 mg by mouth daily.     traZODone (DESYREL) 150 MG tablet Take 150 mg by mouth at bedtime.     triamterene-hydrochlorothiazide (DYAZIDE) 37.5-25 MG capsule Take 1 capsule by mouth daily.      Musculoskeletal: Strength & Muscle Tone: within normal limits Gait & Station: unsteady Patient leans: N/A  Psychiatric Specialty Exam:  Presentation  General Appearance:  Bizarre; Disheveled  Eye  Contact: Fleeting; Fair  Speech: Slurred  Speech Volume: Decreased  Handedness: Right   Mood and Affect  Mood: Anxious  Affect: Congruent   Thought Process  Thought Processes: Coherent  Descriptions of Associations:Intact  Orientation:Full (Time, Place and Person)  Thought Content:WDL  History of Schizophrenia/Schizoaffective disorder:No  Duration of Psychotic Symptoms:No data recorded Hallucinations:Hallucinations: None  Ideas of Reference:None  Suicidal Thoughts:Suicidal Thoughts: No  Homicidal Thoughts:Homicidal Thoughts: No HI Passive Intent and/or Plan: Without Plan   Sensorium  Memory: Immediate Fair; Remote Fair  Judgment: Impaired  Insight: Lacking   Executive Functions  Concentration: Fair  Attention Span: Fair  Recall: Fair  Fund of Knowledge: Poor  Language: Fair   Psychomotor Activity  Psychomotor Activity:Psychomotor Activity: Normal   Assets  Assets: Manufacturing systems engineer; Desire for Improvement; Leisure Time; Vocational/Educational   Sleep  Sleep:Sleep: Fair    Physical Exam: Physical Exam Vitals and nursing note reviewed.    Review of Systems  Psychiatric/Behavioral:  Positive for substance abuse and suicidal ideas.   All other systems reviewed and are negative.  Blood pressure 112/84, pulse 98, temperature 98.3 F (36.8 C), temperature source Oral, resp. rate 18, height 5\' 7"  (1.702 m), weight 111.6 kg, SpO2 100%. Body mass index is 38.53 kg/m.   Disposition: No evidence of imminent risk to self or others at present.   Patient does not meet criteria for psychiatric inpatient admission. Supportive therapy provided about ongoing stressors. Refer to IOP. Discussed crisis plan, support from social network, calling 911, coming to the Emergency Department, and calling Suicide  Hotline.  This service was provided via telemedicine using a 2-way, interactive audio and video technology.   Jearld Lesch, NP 05/11/2023 4:17 AM

## 2023-05-11 NOTE — BH Assessment (Signed)
Comprehensive Clinical Assessment (CCA) Screening, Triage and Referral Note  05/11/2023 SYER VERHOEF [phone] Recommendations for Services/Supports/Treatments: Psych NP Rashaun D. recommended pt be observed and reassessed in the AM.   [name] is a 47 y.o., Caucasian race, Not Hispanic or Latino ethnicity, ENGLISH speaking male who presented ED voluntarily for an evaluation. Per triage note: Pt arrived via [facility]PD voluntarily, pt reports SI, with plan to throw himself in front of a car. Pt states he resides at [facility], but then stated he is homeless. Pt reports using weed and etoh today.  On assessment the pt. admitted that he drank 2 40 oz Tawana Scale prior to arrival. Pt denied using other substances in the last 24 hours. Pt reported that he lives at the [facility], but expressed that being homeless is his underlying core issue. The pt expressed a desire for treatment. Pt endorsed having vague SI/HI with no specific person/plan. Pt explained that people are pushing him to a breaking point. The pt. had fair insight and impaired judgement. Pt noted to be under the influence with slurred speech. The pt. is not connected to any services. Pt was not responding to internal/external stimuli. Pt's thoughts were relevant and linear. Pt was oriented x4. Pt presented with a euthymic mood; affect was congruent. Pt was not well-groomed. Pt's BAL/UDS pending. Pt denied current AV/H.  Chief Complaint:  Chief Complaint  Patient presents with   Psychiatric Evaluation   Visit Diagnosis: Moderate recurrent major depression (HCC) Active Problems:   Alcohol dependence with unspecified alcohol-induced disorder (HCC)   Cocaine use disorder, moderate, dependence (HCC)   Depression   Tobacco use disorder   Neuropathy    Patient Reported Information How did you hear about [place]? Self  What Is the Reason for Your Visit/Call Today? Pt presents to ER via ems with c/o right arm and leg tingling that started a  couple of months ago.  Pt also endorses some dizziness, and occasional pain in center of chest.  Pt also endorses occasional blurry vision, and increased difficulty breathing when laying flat.  Denies significant hx of cardiac, lung, or neurological problems.  Pt is otherwise A&O x4 and in NAD in triage.  How Long Has This Been Causing You Problems? > than 6 months  What Do You Feel Would Help You the Most Today? Treatment for Depression or other mood problem; Alcohol or Drug Use Treatment   Have You Recently Had Any Thoughts About Hurting Yourself? Yes  Are You Planning to Commit Suicide/Harm Yourself At This time? No   Have you Recently Had Thoughts About Hurting Someone [name]? Yes  Are You Planning to Harm Someone at This Time? No  Explanation: Pt admitted to having SI with a plan to jump off of bridge; however pt resisted and came to the ED.   Have You Used Any Alcohol or Drugs in the Past 24 Hours? Yes  How Long Ago Did You Use Drugs or Alcohol? No data recorded What Did You Use and How Much? Cocaine, marijuana, alcohol   Do You Currently Have a Therapist/Psychiatrist? No  Name of Therapist/Psychiatrist: n/a   Have You Been Recently Discharged From Any Office Practice or Programs? No  Explanation of Discharge From Practice/Program: n/a    CCA Screening Triage Referral Assessment Type of Contact: Face-to-Face  Telemedicine Service Delivery:   Is this Initial or Reassessment?   Date Telepsych consult ordered in [facility]:    Time Telepsych consult ordered in [facility]:  Location of Assessment: [facility] ED  Provider Location: [facility] ED    Collateral Involvement: n/a   Does Patient Have a Court Appointed Legal Guardian? No data recorded Name and Contact of Legal Guardian: No data recorded If Minor and Not Living with Parent(s), Who has Custody? n/a  Is CPS involved or ever been involved? Never  Is APS involved or ever been involved? Never   Patient Determined To Be At Risk  for Harm To Self or Others Based on Review of Patient Reported Information or Presenting Complaint? Yes, for Harm to Others  Method: No Plan  Availability of Means: No access or NA  Intent: Vague intent or NA  Notification Required: No need or identified person  Additional Information for Danger to Others Potential: -- (n/a)  Additional Comments for Danger to Others Potential: n/a  Are There Guns or Other Weapons in Your Home? No  Types of Guns/Weapons: n/a  Are These Weapons Safely Secured?                            No  Who Could Verify You Are Able To Have These Secured: n/a  Do You Have any Outstanding Charges, Pending Court Dates, Parole/Probation? None reported  Contacted To Inform of Risk of Harm To Self or Others: -- (n/a)   Does Patient Present under Involuntary Commitment? No    [place] of Residence: [place]   Patient Currently Receiving the Following Services: Not Receiving Services   Determination of Need: Emergent (2 hours)   Options For Referral: ED Visit   Discharge Disposition:     Chibuike Fleek R Samary Shatz, LCAS

## 2023-05-11 NOTE — ED Notes (Signed)
Patient provided with sandwich tray and ginger ale per request.

## 2023-05-11 NOTE — ED Notes (Signed)
Pt voluntary for evaluation on arrival. Declines dressing out into hospital scrubs at this time.

## 2023-05-11 NOTE — Consult Note (Signed)
[facility] Face-to-Face Psychiatry Consult Reassessment   Reason for Consult:  Psychiatric Evaluation Referring Physician:  Layla Maw. Ward, DO Patient Identification: KHUSHAL BENNION MRN:  [id number] Principal Diagnosis: <principal problem not specified> Diagnosis:  Active Problems:   Depression   Tobacco use disorder   ADHD   Substance induced mood disorder (HCC)   Alcohol abuse   Abdominal wall cellulitis   Suicidal ideation   Alcohol abuse with intoxication (HCC)   Total Time spent with patient: 30 minutes  HPI Initial Psychiatry Assessment per R. Durwin Nora, NP 8/14 0359:   [name], 47 y.o., male patient seen via tele health by TTS and this provider; chart reviewed and consulted with Dr. Elesa Massed on 05/11/23.  On evaluation TEVION DERAS reports that he has been dealing with life's stressors and he feels like he cannot take it anymore.  [facility].  This patient is endorsing HI. Reports wanting to hurt people, not specific to who.  While in the ER, he has demonstrated no unsafe behavior, but admitted to drinking two 40 ounces of beer today.  Patient appears intoxicated.  At this time there  is no evidence of a psychiatric condition which is amendable to inpatient psychiatric hospitalization.  Recommend reassessment in the am.  Patients complaints usually resolves once he has rested and sobered up. Patient's HI appears to be conditional and is a means of manipulation and attention seeking without a true intention to hurt anyone. His behavior is more consistent with someone who is acting in self-preservation, rather than someone who is acutely homicidal.   -------------------------------------------------------------------------  8/14 - Patient seen face to face by this provider, consulted with attending psychiatrist M. Marval Regal and emergency department physician Cherlyn Cushing; and chart reviewed on 05/11/23.   On chart review - Patient has a history of multiple visits to  the emergency department for similar complaints, with recent visits on August 7th, and July 31st.  Upon reassessment today, patient observed sleeping soundly and arouses to verbal stimuli. He states that he is doing well and feels pretty good. The patient acknowledges being homeless and having inconsistent living arrangements. He admits to consuming two 40-ounce Tawana Scale prior to arrival at the emergency department and using marijuana and alcohol daily. No withdrawal symptoms observed or reported by the patient. He denies suicidal or homicidal ideation, as well as auditory or visual hallucinations. Sleep and appetite reported fair.   During the evaluation, patient appears disheveled, dressed in hospital scrubs. He is alert, oriented x 4, clam and cooperative. Speech is normal rate and coherent. Eye contact is minimal. Mood is euthymic; affect is congruent with mood. Thought process is coherent and thought content is within defined limits. Patient is able to converse coherently, goal directed thoughts, no distractibility, or pre-occupation.      Past Psychiatric History:  Alcohol dependence with unspecified alcohol-induced disorder (HCC) Cocaine use disorder, moderate, dependence (HCC) Unspecified Depressive disorder Tobacco use disorder Moderate recurrent major depression (HCC)  Risk to Self:  No  Risk to Others:  No  Prior Inpatient Therapy:  Yes  Prior Outpatient Therapy:  Noncompliance  Past Medical History:  Past Medical History:  Diagnosis Date   Alcohol abuse    Anxiety    Cocaine abuse (HCC) 05/27/2016   Depression    Seizures (HCC)     Past Surgical History:  Procedure Laterality Date   FINGER SURGERY     SHOULDER CLOSED REDUCTION Left 03/14/2023   Procedure: Radiological Exam  in OR;  Surgeon: Reinaldo Berber, MD;  Location: [facility] ORS;  Service: Orthopedics;  Laterality: Left;   Family History: History reviewed. No pertinent family history. Family Psychiatric  History:  Unknown Social History:  Social History   Substance and Sexual Activity  Alcohol Use Yes   Alcohol/week: 4.0 - 5.0 standard drinks of alcohol   Types: 4 - 5 Cans of beer per week     Social History   Substance and Sexual Activity  Drug Use Yes   Types: Marijuana, Cocaine    Social History   Socioeconomic History   Marital status: Single    Spouse name: Not on file   Number of children: Not on file   Years of education: Not on file   Highest education level: Not on file  Occupational History   Not on file  Tobacco Use   Smoking status: Every Day    Current packs/day: 1.00    Types: Cigarettes, E-cigarettes   Smokeless tobacco: Never  Substance and Sexual Activity   Alcohol use: Yes    Alcohol/week: 4.0 - 5.0 standard drinks of alcohol    Types: 4 - 5 Cans of beer per week   Drug use: Yes    Types: Marijuana, Cocaine   Sexual activity: Not Currently  Other Topics Concern   Not on file  Social History Narrative   Not on file   Social Determinants of Health   Financial Resource Strain: Not on file  Food Insecurity: Not on file  Transportation Needs: Not on file  Physical Activity: Not on file  Stress: Not on file  Social Connections: Not on file   Additional Social History:    Allergies:   Allergies  Allergen Reactions   Other Itching    Peanut Butter   Peanut Butter Flavor     Other Reaction(s): Unknown    Labs:  Results for orders placed or performed during the hospital encounter of 05/11/23 (from the past 48 hour(s))  Comprehensive metabolic panel     Status: Abnormal   Collection Time: 05/11/23  3:43 AM  Result Value Ref Range   Sodium 135 135 - 145 mmol/L   Potassium 3.7 3.5 - 5.1 mmol/L   Chloride 102 98 - 111 mmol/L   CO2 23 22 - 32 mmol/L   Glucose, Bld 118 (H) 70 - 99 mg/dL    Comment: Glucose reference range applies only to samples taken after fasting for at least 8 hours.   BUN 12 6 - 20 mg/dL   Creatinine, Ser 1.61 0.61 - 1.24 mg/dL    Calcium 8.9 8.9 - 09.6 mg/dL   Total Protein 7.1 6.5 - 8.1 g/dL   Albumin 3.5 3.5 - 5.0 g/dL   AST 43 (H) 15 - 41 U/L   ALT 34 0 - 44 U/L   Alkaline Phosphatase 75 38 - 126 U/L   Total Bilirubin 0.5 0.3 - 1.2 mg/dL   GFR, Estimated >04 >54 mL/min    Comment: (NOTE) Calculated using the CKD-EPI Creatinine Equation (2021)    Anion gap 10 5 - 15    Comment: Performed at [facility], [address], [place], [place] [address]  Ethanol     Status: Abnormal   Collection Time: 05/11/23  3:43 AM  Result Value Ref Range   Alcohol, Ethyl (B) 130 (H) <10 mg/dL    Comment: (NOTE) Lowest detectable limit for serum alcohol is 10 mg/dL.  For medical purposes only. Performed at [facility], [id number]  [address] [place], [place] [address]   CBC with Diff     Status: Abnormal   Collection Time: 05/11/23  3:43 AM  Result Value Ref Range   WBC 5.9 4.0 - 10.5 K/uL   RBC 4.38 4.22 - 5.81 MIL/uL   Hemoglobin 12.3 (L) 13.0 - 17.0 g/dL   HCT 86.5 78.4 - 69.6 %   MCV 89.3 80.0 - 100.0 fL   MCH 28.1 26.0 - 34.0 pg   MCHC 31.5 30.0 - 36.0 g/dL   RDW 29.5 (H) 28.4 - 13.2 %   Platelets 190 150 - 400 K/uL   nRBC 0.0 0.0 - 0.2 %   Neutrophils Relative % 51 %   Neutro Abs 3.0 1.7 - 7.7 K/uL   Lymphocytes Relative 31 %   Lymphs Abs 1.8 0.7 - 4.0 K/uL   Monocytes Relative 13 %   Monocytes Absolute 0.8 0.1 - 1.0 K/uL   Eosinophils Relative 5 %   Eosinophils Absolute 0.3 0.0 - 0.5 K/uL   Basophils Relative 0 %   Basophils Absolute 0.0 0.0 - 0.1 K/uL   Immature Granulocytes 0 %   Abs Immature Granulocytes 0.02 0.00 - 0.07 K/uL    Comment: Performed at [facility], [address] [place], [place] [address]  Magnesium     Status: None   Collection Time: 05/11/23  3:43 AM  Result Value Ref Range   Magnesium 2.1 1.7 - 2.4 mg/dL    Comment: Performed at [facility], [address] [place], [place] [address]  Valproic acid level     Status: None   Collection  Time: 05/11/23  3:43 AM  Result Value Ref Range   Valproic Acid Lvl 61 50.0 - 100.0 ug/mL    Comment: Performed at [facility], [address] [place], [place] [address]  Lithium level     Status: Abnormal   Collection Time: 05/11/23  3:44 AM  Result Value Ref Range   Lithium Lvl 0.21 (L) 0.60 - 1.20 mmol/L    Comment: Performed at [facility], [address] [place], [place] [address]    Current Facility-Administered Medications  Medication Dose Route Frequency Provider Last Rate Last Admin   cephALEXin (KEFLEX) capsule 500 mg  500 mg Oral Q6H Ward, Kristen N, DO   500 mg at 05/11/23 5956   Current Outpatient Medications  Medication Sig Dispense Refill   cephALEXin (KEFLEX) 500 MG capsule Take 1 capsule (500 mg total) by mouth 4 (four) times daily for 7 days. 28 capsule 0   lithium carbonate 300 MG capsule Take 300 mg by mouth 2 (two) times daily with a meal.     omeprazole (PRILOSEC) 20 MG capsule Take 20 mg by mouth 2 (two) times daily before a meal.     promethazine (PHENERGAN) 25 MG tablet Take 25 mg by mouth at bedtime.     propranolol (INDERAL) 20 MG tablet Take 20 mg by mouth 2 (two) times daily.     valproic acid (DEPAKENE) 250 MG capsule Take 750 mg by mouth at bedtime.     citalopram (CELEXA) 40 MG tablet Take 40 mg by mouth daily.     divalproex (DEPAKOTE) 250 MG DR tablet Take 750 mg by mouth at bedtime. (Patient not taking: Reported on 05/11/2023)     traZODone (DESYREL) 150 MG tablet Take 150 mg by mouth at bedtime.     triamterene-hydrochlorothiazide (DYAZIDE) 37.5-25 MG capsule Take 1 capsule by mouth daily.  Musculoskeletal: Strength & Muscle Tone: within normal limits Gait & Station:  Did not assess  Patient leans: N/A            Psychiatric Specialty Exam:  Presentation  General Appearance:  Disheveled  Eye Contact: Fair  Speech: Clear and Coherent  Speech Volume: Normal  Handedness: Right   Mood and Affect   Mood: -- (Appropriate)  Affect: Congruent   Thought Process  Thought Processes: Coherent  Descriptions of Associations:Intact  Orientation:Full (Time, Place and Person)  Thought Content:WDL  History of Schizophrenia/Schizoaffective disorder:No  Duration of Psychotic Symptoms:No data recorded Hallucinations:Hallucinations: None  Ideas of Reference:None  Suicidal Thoughts:Suicidal Thoughts: No SI Passive Intent and/or Plan: Without Intent; Without Plan  Homicidal Thoughts:Homicidal Thoughts: No HI Passive Intent and/or Plan: Without Plan   Sensorium  Memory: Immediate Good; Recent Good  Judgment: Good  Insight: Good   Executive Functions  Concentration: Fair  Attention Span: Fair  Recall: Good  Fund of Knowledge: Good  Language: Good   Psychomotor Activity  Psychomotor Activity: Psychomotor Activity: Normal   Assets  Assets: Communication Skills; Desire for Improvement; Leisure Time   Sleep  Sleep: Sleep: Fair   Physical Exam: Physical Exam Vitals and nursing note reviewed.  Constitutional:      General: He is not in acute distress. HENT:     Head: Normocephalic.     Nose: Nose normal.  Cardiovascular:     Rate and Rhythm: Normal rate.     Pulses: Normal pulses.  Pulmonary:     Effort: Pulmonary effort is normal. No respiratory distress.  Musculoskeletal:        General: Normal range of motion.     Cervical back: Normal range of motion.  Neurological:     Mental Status: He is alert and oriented to person, place, and time.  Psychiatric:        Attention and Perception: Attention and perception normal. He does not perceive auditory or visual hallucinations.        Mood and Affect: Mood and affect normal.        Speech: Speech normal.        Behavior: Behavior is not aggressive. Behavior is cooperative.        Thought Content: Thought content is not paranoid or delusional. Thought content does not include homicidal or  suicidal ideation.        Cognition and Memory: Cognition and memory normal.        Judgment: Judgment normal.    Review of Systems  Respiratory:  Negative for shortness of breath.   All other systems reviewed and are negative.  Blood pressure 93/64, pulse 70, temperature 98.3 F (36.8 C), temperature source Oral, resp. rate 17, height 5\' 7"  (1.702 m), weight 111.6 kg, SpO2 100%. Body mass index is 38.53 kg/m.  Treatment Plan Summary: Plan :  47 year old male well-known to the emergency department, with a history of alcohol abuse, anxiety, and cocaine use, presented to the emergency department endorsing homicidal ideation while intoxicated. On reassessment, the patient denies suicidal or homicidal ideation and appears to have metabolized alcohol. No auditory or visual hallucinations reported. Patient reports being homeless and having inconsistent living arrangements, which may contribute to his recurrent emergency department visits.  Alcohol intoxication and substance abuse:  - Provide resources for substance abuse counseling and treatment programs. Encourage patient to seek help for their substance abuse issues.  - Safety and Well-being: If feelings of harm towards others or yourself arise, it is crucial to  seek immediate help from a healthcare professional or emergency services.   Homelessness and inconsistent living arrangements: - Provide information on local shelters and resources for homeless individuals.     Disposition: No evidence of imminent risk to self or others at present.   Patient does not meet criteria for psychiatric inpatient admission. Supportive therapy provided about ongoing stressors. Discussed crisis plan, support from social network, calling 911, coming to the Emergency Department, and calling Suicide Hotline.  Norma Fredrickson, NP 05/11/2023 10:26 AM

## 2023-05-11 NOTE — ED Notes (Signed)
Pt sleeping at this time. No clinical signs of distress.

## 2023-05-11 NOTE — ED Notes (Signed)
TTS at bedside speaking with patient.  

## 2023-05-11 NOTE — ED Provider Notes (Addendum)
[facility] Provider Note    Event Date/Time   First MD Initiated Contact with Patient 05/11/23 [phone]     (approximate)   History   Psychiatric Evaluation   HPI  Alex [name] is a 47 y.o. male with history of substance abuse, homelessness who presents to the emergency department with complaints of suicidal thoughts.  States he plans to jump off a bridge.  Endorses alcohol and marijuana use today.  Has thoughts of wanting to harm people that "live near me".  No hallucinations.  Complains of some scratches to his abdomen with surrounding redness and numbness in his legs.   History provided by patient.    Past Medical History:  Diagnosis Date   Alcohol abuse    Anxiety    Cocaine abuse (HCC) 05/27/2016   Depression    Seizures (HCC)     Past Surgical History:  Procedure Laterality Date   FINGER SURGERY     SHOULDER CLOSED REDUCTION Left 03/14/2023   Procedure: Radiological Exam in OR;  Surgeon: Reinaldo Berber, MD;  Location: [facility] ORS;  Service: Orthopedics;  Laterality: Left;    MEDICATIONS:  Prior to Admission medications   Medication Sig Start Date End Date Taking? Authorizing Provider  citalopram (CELEXA) 40 MG tablet Take 40 mg by mouth daily.    [provider]  divalproex (DEPAKOTE) 250 MG DR tablet Take 750 mg by mouth at bedtime.    [provider]  lithium carbonate 300 MG capsule Take 300 mg by mouth 2 (two) times daily with a meal.    [provider]  omeprazole (PRILOSEC) 20 MG capsule Take 20 mg by mouth 2 (two) times daily before a meal.    [provider]  promethazine (PHENERGAN) 25 MG tablet Take 25 mg by mouth at bedtime.    [provider]  propranolol (INDERAL) 10 MG tablet Take 10 mg by mouth daily.    [provider]  traZODone (DESYREL) 150 MG tablet Take 150 mg by mouth at bedtime.    [provider]  triamterene-hydrochlorothiazide (DYAZIDE) 37.5-25 MG  capsule Take 1 capsule by mouth daily.    [provider]    Physical Exam   Triage Vital Signs: ED Triage Vitals  Encounter Vitals Group     BP      Systolic BP Percentile      Diastolic BP Percentile      Pulse      Resp      Temp      Temp src      SpO2      Weight      Height      Head Circumference      Peak Flow      Pain Score      Pain Loc      Pain Education      Exclude from Growth Chart     Most recent vital signs: Vitals:   05/11/23 0337  BP: 112/84  Pulse: 98  Resp: 18  Temp: 98.3 F (36.8 C)  SpO2: 100%    CONSTITUTIONAL: Alert, responds appropriately to questions.  Chronically ill-appearing, obese, appears older than stated age, intoxicated HEAD: Normocephalic, atraumatic EYES: Conjunctivae clear, pupils appear equal, sclera nonicteric ENT: normal nose; moist mucous membranes NECK: Supple, normal ROM CARD: RRR; S1 and S2 appreciated RESP: Normal chest excursion without splinting or tachypnea; breath sounds clear and equal bilaterally; no wheezes, no rhonchi, no rales, no hypoxia or  respiratory distress, speaking full sentences ABD/GI: Non-distended; soft, non-tender, no rebound, no guarding, no peritoneal signs, several superficial scratches noted to the abdomen with surrounding redness but no induration, fluctuance or drainage BACK: The back appears normal, no midline spine tenderness or step-off or deformity EXT: Normal ROM in all joints; no deformity noted, no edema SKIN: Normal color for age and race; warm; no rash on exposed skin NEURO: Moves all extremities equally, normal speech, ambulates with normal gait PSYCH: The patient's mood and manner are appropriate.   ED Results / Procedures / Treatments   LABS: (all labs ordered are listed, but only abnormal results are displayed) Labs Reviewed  COMPREHENSIVE METABOLIC PANEL - Abnormal; Notable for the following components:      Result Value   Glucose, Bld 118 (*)    AST 43 (*)     All other components within normal limits  ETHANOL - Abnormal; Notable for the following components:   Alcohol, Ethyl (B) 130 (*)    All other components within normal limits  CBC WITH DIFFERENTIAL/PLATELET - Abnormal; Notable for the following components:   Hemoglobin 12.3 (*)    RDW 16.1 (*)    All other components within normal limits  LITHIUM LEVEL - Abnormal; Notable for the following components:   Lithium Lvl 0.21 (*)    All other components within normal limits  MAGNESIUM  VALPROIC ACID LEVEL  URINE DRUG SCREEN, QUALITATIVE ([facility] ONLY)     EKG:     RADIOLOGY: My personal review and interpretation of imaging:    I have personally reviewed all radiology reports.   No results found.   PROCEDURES:  Critical Care performed: No     Procedures    IMPRESSION / MDM / ASSESSMENT AND PLAN / ED COURSE  I reviewed the triage vital signs and the nursing notes.    Patient here with complaints of suicidal thoughts.  Also history of substance abuse.  Also complaining of leg numbness likely from neuropathy from substance abuse.  Also has abdominal wall cellulitis on exam.    DIFFERENTIAL DIAGNOSIS (includes but not limited to):   Suicidal thoughts, depression, alcohol abuse, substance abuse, malingering, homelessness, abdominal wall cellulitis, neuropathy from alcohol use, electrolyte derangement   Patient's presentation is most consistent with acute presentation with potential threat to life or bodily function.   PLAN: Patient here voluntarily.  Will obtain screening labs and urine.  Will start him on Keflex for his abdominal wall cellulitis.  Will check electrolytes to ensure no abnormality contributing to neuropathy but likely secondary to his alcohol abuse.     MEDICATIONS GIVEN IN ED: Medications  cephALEXin (KEFLEX) capsule 500 mg (500 mg Oral Not Given 05/11/23 0509)     ED COURSE: Screening labs unremarkable other than ethanol level of 130.  Therapeutic  Depakote level.  Nontherapeutic lithium level.  Electrolytes normal.   CONSULTS: Psychiatry and TTS consulted.  Dispo per psychiatry.   OUTSIDE RECORDS REVIEWED: Reviewed previous psychiatric notes.       FINAL CLINICAL IMPRESSION(S) / ED DIAGNOSES   Final diagnoses:  Abdominal wall cellulitis  Suicidal ideation  Alcohol abuse     Rx / DC Orders   ED Discharge Orders          Ordered    cephALEXin (KEFLEX) 500 MG capsule  4 times daily        05/11/23 0338    cephALEXin (KEFLEX) 500 MG capsule  4 times daily,   Status:  Discontinued  05/11/23 1610             Note:  This document was prepared using Dragon voice recognition software and may include unintentional dictation errors.     Nycole Kawahara, Layla Maw, DO 05/11/23 [phone]

## 2023-05-11 NOTE — ED Triage Notes (Signed)
Pt arrived via [employer] voluntarily, pt reports SI, with plan to throw himself in front of a car.  Pt states he resides at [facility], but then stated he is homeless.  Pt reports using weed and etoh today.  Pt ambulatory without difficulty.

## 2023-05-11 NOTE — ED Provider Notes (Signed)
Care of this patient assumed from prior physician at 0700 pending psychiatric evaluation and disposition. Please see prior physician note for further details.  Briefly this is a 47 year old male who presented with suicidal thoughts, also noted to have some abdominal wall cellulitis.  Signed out pending psychiatry and TTS consult.  Patient was evaluated by psychiatry.  Case was discussed with NP Willeen Cass.  Patient has been cleared by psychiatry.  He is stable for discharge home.  Prescription for Keflex for abdominal wall cellulitis sent by prior provider.  Patient discharged stable condition.   Trinna Post, MD 05/11/23 [phone]

## 2023-05-11 NOTE — ED Notes (Signed)
Pt sleeping. Will respond to verbal stimulation. Pt not awake enough for appropriate assessment.

## 2023-05-15 ENCOUNTER — Other Ambulatory Visit: Payer: Self-pay

## 2023-05-15 ENCOUNTER — Emergency Department: Payer: MEDICAID

## 2023-05-15 DIAGNOSIS — M25571 Pain in right ankle and joints of right foot: Secondary | ICD-10-CM | POA: Insufficient documentation

## 2023-05-15 DIAGNOSIS — S0990XA Unspecified injury of head, initial encounter: Secondary | ICD-10-CM | POA: Insufficient documentation

## 2023-05-15 DIAGNOSIS — F10120 Alcohol abuse with intoxication, uncomplicated: Secondary | ICD-10-CM | POA: Insufficient documentation

## 2023-05-15 DIAGNOSIS — S8991XA Unspecified injury of right lower leg, initial encounter: Secondary | ICD-10-CM | POA: Diagnosis not present

## 2023-05-15 NOTE — ED Notes (Signed)
Pt is walking around the lobby. Pt has been to the desk several times and asked can he be seen. I informed him he would have to wait his turn. Pt has called [employer] and they are coming to pick him up.

## 2023-05-15 NOTE — ED Notes (Signed)
This RN removed his IV that was placed by EMS.
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
## 2023-05-16 ENCOUNTER — Emergency Department
Admission: EM | Admit: 2023-05-16 | Discharge: 2023-05-16 | Disposition: A | Discharge status: Home / Self Care | Payer: MEDICAID | Attending: Emergency Medicine | Admitting: Emergency Medicine

## 2023-05-16 ENCOUNTER — Emergency Department: Payer: MEDICAID

## 2023-05-16 DIAGNOSIS — S0990XA Unspecified injury of head, initial encounter: Secondary | ICD-10-CM

## 2023-05-16 DIAGNOSIS — T148XXA Other injury of unspecified body region, initial encounter: Secondary | ICD-10-CM
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
## 2023-05-22 ENCOUNTER — Encounter: Payer: Self-pay | Admitting: Emergency Medicine

## 2023-05-22 ENCOUNTER — Other Ambulatory Visit: Payer: Self-pay

## 2023-05-22 ENCOUNTER — Emergency Department
Admission: EM | Admit: 2023-05-22 | Discharge: 2023-05-23 | Disposition: A | Discharge status: Home / Self Care | Payer: MEDICAID | Attending: Emergency Medicine | Admitting: Emergency Medicine

## 2023-05-22 DIAGNOSIS — Z91010 Allergy to peanuts: Secondary | ICD-10-CM | POA: Diagnosis not present

## 2023-05-22 DIAGNOSIS — F1414 Cocaine abuse with cocaine-induced mood disorder: Secondary | ICD-10-CM

## 2023-05-22 DIAGNOSIS — F1420 Cocaine dependence, uncomplicated: Secondary | ICD-10-CM | POA: Diagnosis present

## 2023-05-22 DIAGNOSIS — F32A Depression, unspecified: Secondary | ICD-10-CM | POA: Diagnosis present

## 2023-05-22 DIAGNOSIS — F10129 Alcohol abuse with intoxication, unspecified: Secondary | ICD-10-CM | POA: Diagnosis not present

## 2023-05-22 DIAGNOSIS — F17200 Nicotine dependence, unspecified, uncomplicated: Secondary | ICD-10-CM | POA: Diagnosis present

## 2023-05-22 DIAGNOSIS — Z765 Malingerer [conscious simulation]: Secondary | ICD-10-CM | POA: Diagnosis not present

## 2023-05-22 DIAGNOSIS — F331 Major depressive disorder, recurrent, moderate: Secondary | ICD-10-CM | POA: Diagnosis present

## 2023-05-22 DIAGNOSIS — F17210 Nicotine dependence, cigarettes, uncomplicated: Secondary | ICD-10-CM | POA: Diagnosis not present

## 2023-05-22 DIAGNOSIS — F1910 Other psychoactive substance abuse, uncomplicated: Secondary | ICD-10-CM | POA: Diagnosis not present

## 2023-05-22 DIAGNOSIS — R45851 Suicidal ideations: Secondary | ICD-10-CM | POA: Diagnosis not present
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
## 2023-05-23 DIAGNOSIS — F10129 Alcohol abuse with intoxication, unspecified: Secondary | ICD-10-CM | POA: Diagnosis not present

## 2023-05-23 DIAGNOSIS — F1414 Cocaine abuse with cocaine-induced mood disorder: Secondary | ICD-10-CM

## 2023-05-23 DIAGNOSIS — R45851 Suicidal ideations: Secondary | ICD-10-CM

## 2023-05-23 DIAGNOSIS — Z765 Malingerer [conscious simulation]: Secondary | ICD-10-CM

## 2023-05-23 LAB — COMPREHENSIVE METABOLIC PANEL
ALT: 30 U/L (ref 0–44)
AST: 23 U/L (ref 15–41)
Albumin: 3.9 g/dL (ref 3.5–5.0)
Alkaline Phosphatase: 65 U/L (ref 38–126)
Anion gap: 11 (ref 5–15)
BUN: 14 mg/dL (ref 6–20)
CO2: 24 mmol/L (ref 22–32)
Calcium: 8.7 mg/dL — ABNORMAL LOW (ref 8.9–10.3)
Chloride: 99 mmol/L (ref 98–111)
Creatinine, Ser: 0.72 mg/dL (ref 0.61–1.24)
GFR, Estimated: >60 mL/min (ref >60)
Glucose, Bld: 91 mg/dL (ref 70–99)
Potassium: 3.8 mmol/L (ref 3.5–5.1)
Sodium: 134 mmol/L — ABNORMAL LOW (ref 135–145)
Total Bilirubin: 0.6 mg/dL (ref 0.3–1.2)
Total Protein: 7.3 g/dL (ref 6.5–8.1)

## 2023-05-23 LAB — CBC
HCT: 39.5 % (ref 39.0–52.0)
Hemoglobin: 12.6 g/dL — ABNORMAL LOW (ref 13.0–17.0)
MCH: 28.6 pg (ref 26.0–34.0)
MCHC: 31.9 g/dL (ref 30.0–36.0)
MCV: 89.8 fL (ref 80.0–100.0)
Platelets: 236 10*3/uL (ref 150–400)
RBC: 4.4 MIL/uL (ref 4.22–5.81)
RDW: 17.5 % — ABNORMAL HIGH (ref 11.5–15.5)
WBC: 7 10*3/uL (ref 4.0–10.5)
nRBC: 0 % (ref 0.0–0.2)

## 2023-05-23 LAB — URINE DRUG SCREEN, QUALITATIVE (ARMC ONLY)
Amphetamines, Ur Screen: NOT DETECTED
Barbiturates, Ur Screen: NOT DETECTED
Benzodiazepine, Ur Scrn: NOT DETECTED
Cannabinoid 50 Ng, Ur ~~LOC~~: POSITIVE — AB
Cocaine Metabolite,Ur ~~LOC~~: POSITIVE — AB
MDMA (Ecstasy)Ur Screen: NOT DETECTED
Methadone Scn, Ur: NOT DETECTED
Opiate, Ur Screen: NOT DETECTED
Phencyclidine (PCP) Ur S: NOT DETECTED
Tricyclic, Ur Screen: NOT DETECTED

## 2023-05-23 LAB — SALICYLATE LEVEL: Salicylate Lvl: <7 mg/dL — ABNORMAL LOW (ref 7.0–30.0)

## 2023-05-23 LAB — ETHANOL: Alcohol, Ethyl (B): 219 mg/dL — ABNORMAL HIGH (ref <10)

## 2023-05-23 LAB — ACETAMINOPHEN LEVEL: Acetaminophen (Tylenol), Serum: <10 ug/mL — ABNORMAL LOW (ref 10–30)

## 2023-05-23 NOTE — ED Notes (Signed)
Black and white slides   White t shirt Black shorts Green and black socks underwear

## 2023-05-23 NOTE — ED Notes (Signed)
VOL/psych cleared/pending discharge

## 2023-05-23 NOTE — Consult Note (Signed)
[facility] Face-to-Face Psychiatry Consult   Reason for Consult:  Psych evaluation Referring Physician:  Dr. Katrinka Blazing Patient Identification: Alex [name] MRN:  [id number] Principal Diagnosis: <principal problem not specified> Diagnosis:  Active Problems:   * No active hospital problems. *   Total Time spent with patient: 30 minutes  Subjective:  Pt states he has has had a lot to deal with, states a lot of external stressors currently. Pt endorses ETOH use, and also c/o R foot pain. Pt alert and oriented, presents to ED tearful, cooperative.    HPI: Alex [name], 47 y.o., male patient seen  by this provider; chart reviewed and consulted with Dr. Katrinka Blazing on 05/23/23.  Per triage note, pt to ED via [facility] with c/o SI. Pt states plan to hurt himself by "jumping off bridge or death by cop". Pt states he has has had a lot to deal with, states a lot of external stressors currently. Pt endorses ETOH use, and also c/o R foot pain. Pt alert and oriented, presents to ED tearful, cooperative.  Patient's BAL 219 during assessment and UDS is positive for cocaine and cannabis.  Per chart review, patient has presented to this er 9x in 6 months.  Most recent time being 8/6, 8/14, 8/19, and this evening.    [facility].  This patient is endorsing SI. There is no indication of prior attempts.  While in the ER during assessment admitted to drinking 3 40 ounce beers.  Patient appears intoxicated.  At this time there  is no evidence of a psychiatric condition which is amendable to inpatient psychiatric hospitalization.   Patients complaints usually resolves once he has rested and sobered up. Patient's SI appears to be conditional and is a means of manipulation and attention seeking without a true intention to hurt himself.   During the evaluation, patient appears disheveled, dressed in hospital scrubs. He is alert, oriented x 4, clam and cooperative. Speech is normal rate and coherent. Eye  contact is minimal. Mood is euthymic; affect is congruent with mood. Thought process is coherent and thought content is within defined limits. Patient is able to converse coherently, goal directed thoughts, no distractibility, or pre-occupation.    Recommendation:  Psych cleared. Discharge when medically cleared  Dr. Katrinka Blazing; informed of above recommendation and disposition   Past Psychiatric History: Alcohol Abuse  Risk to Self:   Risk to Others:   Prior Inpatient Therapy:   Prior Outpatient Therapy:    Past Medical History:  Past Medical History:  Diagnosis Date   Alcohol abuse    Anxiety    Cocaine abuse (HCC) 05/27/2016   Depression    Seizures (HCC)     Past Surgical History:  Procedure Laterality Date   FINGER SURGERY     SHOULDER CLOSED REDUCTION Left 03/14/2023   Procedure: Radiological Exam in OR;  Surgeon: Reinaldo Berber, MD;  Location: [facility] ORS;  Service: Orthopedics;  Laterality: Left;   Family History: History reviewed. No pertinent family history. Family Psychiatric  History: unknown Social History:  Social History   Substance and Sexual Activity  Alcohol Use Yes   Alcohol/week: 4.0 - 5.0 standard drinks of alcohol   Types: 4 - 5 Cans of beer per week     Social History   Substance and Sexual Activity  Drug Use Yes   Types: Marijuana, Cocaine    Social History   Socioeconomic History   Marital status: Single    Spouse name: Not on  file   Number of children: Not on file   Years of education: Not on file   Highest education level: Not on file  Occupational History   Not on file  Tobacco Use   Smoking status: Every Day    Current packs/day: 1.00    Types: Cigarettes, E-cigarettes   Smokeless tobacco: Never  Substance and Sexual Activity   Alcohol use: Yes    Alcohol/week: 4.0 - 5.0 standard drinks of alcohol    Types: 4 - 5 Cans of beer per week   Drug use: Yes    Types: Marijuana, Cocaine   Sexual activity: Not Currently  Other Topics  Concern   Not on file  Social History Narrative   Not on file   Social Determinants of Health   Financial Resource Strain: Not on file  Food Insecurity: Not on file  Transportation Needs: Not on file  Physical Activity: Not on file  Stress: Not on file  Social Connections: Not on file   Additional Social History:    Allergies:   Allergies  Allergen Reactions   Other Itching    Peanut Butter   Peanut Butter Flavor     Other Reaction(s): Unknown    Labs:  Results for orders placed or performed during the hospital encounter of 05/22/23 (from the past 48 hour(s))  Comprehensive metabolic panel     Status: Abnormal   Collection Time: 05/23/23 12:01 AM  Result Value Ref Range   Sodium 134 (L) 135 - 145 mmol/L   Potassium 3.8 3.5 - 5.1 mmol/L   Chloride 99 98 - 111 mmol/L   CO2 24 22 - 32 mmol/L   Glucose, Bld 91 70 - 99 mg/dL    Comment: Glucose reference range applies only to samples taken after fasting for at least 8 hours.   BUN 14 6 - 20 mg/dL   Creatinine, Ser 1.61 0.61 - 1.24 mg/dL   Calcium 8.7 (L) 8.9 - 10.3 mg/dL   Total Protein 7.3 6.5 - 8.1 g/dL   Albumin 3.9 3.5 - 5.0 g/dL   AST 23 15 - 41 U/L   ALT 30 0 - 44 U/L   Alkaline Phosphatase 65 38 - 126 U/L   Total Bilirubin 0.6 0.3 - 1.2 mg/dL   GFR, Estimated >09 >60 mL/min    Comment: (NOTE) Calculated using the CKD-EPI Creatinine Equation (2021)    Anion gap 11 5 - 15    Comment: Performed at [facility], [address] [place], [place] [address]  Salicylate level     Status: Abnormal   Collection Time: 05/23/23 12:01 AM  Result Value Ref Range   Salicylate Lvl <7.0 (L) 7.0 - 30.0 mg/dL    Comment: Performed at [facility], [address] [place], [place] [address]  Acetaminophen level     Status: Abnormal   Collection Time: 05/23/23 12:01 AM  Result Value Ref Range   Acetaminophen (Tylenol), Serum <10 (L) 10 - 30 ug/mL    Comment: (NOTE) Therapeutic concentrations vary  significantly. A range of 10-30 ug/mL  may be an effective concentration for many patients. However, some  are best treated at concentrations outside of this range. Acetaminophen concentrations >150 ug/mL at 4 hours after ingestion  and >50 ug/mL at 12 hours after ingestion are often associated with  toxic reactions.  Performed at [facility], [address] [place], [place] [address]   cbc     Status: Abnormal   Collection Time: 05/23/23  12:01 AM  Result Value Ref Range   WBC 7.0 4.0 - 10.5 K/uL   RBC 4.40 4.22 - 5.81 MIL/uL   Hemoglobin 12.6 (L) 13.0 - 17.0 g/dL   HCT 78.2 95.6 - 21.3 %   MCV 89.8 80.0 - 100.0 fL   MCH 28.6 26.0 - 34.0 pg   MCHC 31.9 30.0 - 36.0 g/dL   RDW 08.6 (H) 57.8 - 46.9 %   Platelets 236 150 - 400 K/uL   nRBC 0.0 0.0 - 0.2 %    Comment: Performed at [facility], [address] [place], [place] [address]    No current facility-administered medications for this encounter.   Current Outpatient Medications  Medication Sig Dispense Refill   citalopram (CELEXA) 40 MG tablet Take 40 mg by mouth daily.     divalproex (DEPAKOTE) 250 MG DR tablet Take 750 mg by mouth at bedtime. (Patient not taking: Reported on 05/11/2023)     lithium carbonate 300 MG capsule Take 300 mg by mouth 2 (two) times daily with a meal.     omeprazole (PRILOSEC) 20 MG capsule Take 20 mg by mouth 2 (two) times daily before a meal.     promethazine (PHENERGAN) 25 MG tablet Take 25 mg by mouth at bedtime.     propranolol (INDERAL) 20 MG tablet Take 20 mg by mouth 2 (two) times daily.     traZODone (DESYREL) 150 MG tablet Take 150 mg by mouth at bedtime.     triamterene-hydrochlorothiazide (DYAZIDE) 37.5-25 MG capsule Take 1 capsule by mouth daily.     valproic acid (DEPAKENE) 250 MG capsule Take 750 mg by mouth at bedtime.      Musculoskeletal: Strength & Muscle Tone: within normal limits Gait & Station: unsteady Patient leans: N/A    Psychiatric Specialty  Exam:  Presentation  General Appearance:  Disheveled  Eye Contact: Fair  Speech: Clear and Coherent  Speech Volume: Normal  Handedness: Right   Mood and Affect  Mood: -- (Appropriate)  Affect: Congruent   Thought Process  Thought Processes: Coherent  Descriptions of Associations:Intact  Orientation:Full (Time, Place and Person)  Thought Content:WDL  History of Schizophrenia/Schizoaffective disorder:No  Duration of Psychotic Symptoms:None Hallucinations:none Ideas of Reference:None  Suicidal Thoughts:Yes passive, chronic Homicidal Thoughts: No  Sensorium  Memory: Immediate Good; Recent Good  Judgment: Good  Insight: Good   Executive Functions  Concentration: Fair  Attention Span: Fair  Recall: Good  Fund of Knowledge: Good  Language: Good   Assets  Assets: Communication Skills; Desire for Improvement; Leisure Time   Sleep  Sleep:Fair   Physical Exam: Physical Exam Vitals and nursing note reviewed.  Constitutional:      Appearance: Normal appearance. He is toxic-appearing.  HENT:     Head: Normocephalic and atraumatic.     Nose: Nose normal.  Eyes:     Pupils: Pupils are equal, round, and reactive to light.  Pulmonary:     Effort: Pulmonary effort is normal.  Musculoskeletal:        General: Normal range of motion.     Cervical back: Normal range of motion.  Skin:    General: Skin is dry.  Neurological:     Mental Status: He is oriented to person, place, and time.  Psychiatric:        Attention and Perception: Attention and perception normal.        Mood and Affect: Mood normal. Affect is flat.        Speech: Speech normal.  Behavior: Behavior is cooperative.        Thought Content: Thought content does not include suicidal plan.        Cognition and Memory: Cognition is impaired.        Judgment: Judgment is impulsive.    Review of Systems  Psychiatric/Behavioral:  Positive for depression and  substance abuse. The patient is nervous/anxious.   All other systems reviewed and are negative.  Blood pressure 102/79, pulse 72, temperature 97.9 F (36.6 C), temperature source Oral, resp. rate 20, height 5\' 7"  (1.702 m), weight 108.9 kg, SpO2 95%. Body mass index is 37.59 kg/m.   Disposition: No evidence of imminent risk to self or others at present.   Patient does not meet criteria for psychiatric inpatient admission. Discussed crisis plan, support from social network, calling 911, coming to the Emergency Department, and calling Suicide Hotline.  Jearld Lesch, NP 05/23/2023 1:10 AM

## 2023-05-23 NOTE — ED Notes (Signed)
MD and RN spoke with patient at bedside.  MD informed patient that he will be discharged, pt acknowledges.

## 2023-05-23 NOTE — ED Provider Notes (Addendum)
[facility] Provider Note    Event Date/Time   First MD Initiated Contact with Patient 05/23/23 0005     (approximate)   History   No chief complaint on file.   HPI  Alex [name] is a 47 y.o. male who presents to the ED for evaluation of No chief complaint on file.   Patient presents to the ED for evaluation of "I got a lot of problems."  And he reports suicidal intent of jumping off a bridge.  Reports drinking ethanol daily, cannabis and cocaine.  Has not yet done anything to harm himself tonight.   Physical Exam   Triage Vital Signs: ED Triage Vitals  Encounter Vitals Group     BP 05/23/23 0009 102/79     Systolic BP Percentile --      Diastolic BP Percentile --      Pulse Rate 05/23/23 0009 72     Resp 05/23/23 0009 20     Temp 05/23/23 0009 97.9 F (36.6 C)     Temp Source 05/23/23 0009 Oral     SpO2 05/23/23 0009 95 %     Weight 05/22/23 2343 246 lb (111.6 kg)     Height 05/22/23 2343 5\' 7"  (1.702 m)     Head Circumference --      Peak Flow --      Pain Score 05/22/23 2343 10     Pain Loc --      Pain Education --      Exclude from Growth Chart --     Most recent vital signs: Vitals:   05/23/23 0009  BP: 102/79  Pulse: 72  Resp: 20  Temp: 97.9 F (36.6 C)  SpO2: 95%    General: Awake, no distress.  CV:  Good peripheral perfusion.  Resp:  Normal effort.  Abd:  No distention.  MSK:  No deformity noted.  Neuro:  No focal deficits appreciated. Other:     ED Results / Procedures / Treatments   Labs (all labs ordered are listed, but only abnormal results are displayed) Labs Reviewed  COMPREHENSIVE METABOLIC PANEL - Abnormal; Notable for the following components:      Result Value   Sodium 134 (*)    Calcium 8.7 (*)    All other components within normal limits  ETHANOL - Abnormal; Notable for the following components:   Alcohol, Ethyl (B) 219 (*)    All other components within normal limits  SALICYLATE LEVEL -  Abnormal; Notable for the following components:   Salicylate Lvl <7.0 (*)    All other components within normal limits  ACETAMINOPHEN LEVEL - Abnormal; Notable for the following components:   Acetaminophen (Tylenol), Serum <10 (*)    All other components within normal limits  CBC - Abnormal; Notable for the following components:   Hemoglobin 12.6 (*)    RDW 17.5 (*)    All other components within normal limits  URINE DRUG SCREEN, QUALITATIVE ([facility] ONLY) - Abnormal; Notable for the following components:   Cocaine Metabolite,Ur [place] POSITIVE (*)    Cannabinoid 50 Ng, Ur [place] POSITIVE (*)    All other components within normal limits    EKG   RADIOLOGY   Official radiology report(s): No results found.  PROCEDURES and INTERVENTIONS:  Procedures  Medications - No data to display   IMPRESSION / MDM / ASSESSMENT AND PLAN / ED COURSE  I reviewed the triage vital signs and the nursing notes.  Differential diagnosis includes,  but is not limited to, malingering, polysubstance abuse, overdose or SI attempt  {Patient presents with symptoms of an acute illness or injury that is potentially life-threatening.  Patient presents with vague SI in the setting of psychosocial issues and polysubstance abuse.  Suspect a degree of malingering and substance-induced mood disorder.  We will consult with psychiatry.  No evidence of medical pathology to preclude psychiatric evaluation.  No particular toxidromes.  CBC and metabolic panel are normal.  Awaiting UDS.  Psych cleared. Elevated serum ethanol. UDS with cannabis and cocaine metabolites.   We will feed him, let him sober up, reassess and likely dc  Clinical Course as of 05/23/23 0206  Mon May 23, 2023  0104 The patient has been placed in psychiatric observation due to the need to provide a safe environment for the patient while obtaining psychiatric consultation and evaluation, as well as ongoing medical and medication management to treat the  patient's condition.  The patient has not been placed under full IVC at this time.   [DS]  0206 Seen by psych, cleared from their perspective. [DS]    Clinical Course User Index [DS] Delton Prairie, MD     FINAL CLINICAL IMPRESSION(S) / ED DIAGNOSES   Final diagnoses:  Suicidal ideations     Rx / DC Orders   ED Discharge Orders     None        Note:  This document was prepared using Dragon voice recognition software and may include unintentional dictation errors.   Delton Prairie, MD 05/23/23 1610    Delton Prairie, MD 05/23/23 [phone]

## 2023-05-23 NOTE — ED Notes (Signed)
Patient admits to drinking alcohol daily, also reports that he used cocaine today.

## 2023-06-23 ENCOUNTER — Emergency Department
Admission: EM | Admit: 2023-06-23 | Discharge: 2023-06-23 | Disposition: A | Discharge status: Home / Self Care | Attending: Emergency Medicine | Admitting: Emergency Medicine

## 2023-06-23 ENCOUNTER — Emergency Department

## 2023-06-23 ENCOUNTER — Other Ambulatory Visit: Payer: Self-pay

## 2023-06-23 DIAGNOSIS — R569 Unspecified convulsions: Secondary | ICD-10-CM

## 2023-06-23 DIAGNOSIS — R251 Tremor, unspecified: Secondary | ICD-10-CM | POA: Insufficient documentation

## 2023-06-23 DIAGNOSIS — M25512 Pain in left shoulder: Secondary | ICD-10-CM | POA: Insufficient documentation

## 2023-06-23 DIAGNOSIS — F1090 Alcohol use, unspecified, uncomplicated: Secondary | ICD-10-CM | POA: Insufficient documentation

## 2023-06-23 DIAGNOSIS — G40909 Epilepsy, unspecified, not intractable, without status epilepticus: Secondary | ICD-10-CM | POA: Diagnosis not present

## 2023-06-23 DIAGNOSIS — Y906 Blood alcohol level of 120-199 mg/100 ml: Secondary | ICD-10-CM | POA: Diagnosis not present

## 2023-06-23 DIAGNOSIS — S4992XA Unspecified injury of left shoulder and upper arm, initial encounter: Secondary | ICD-10-CM

## 2023-06-23 LAB — ETHANOL: Alcohol, Ethyl (B): 195 mg/dL — ABNORMAL HIGH (ref <10)

## 2023-06-23 LAB — CBC
HCT: 39.4 % (ref 39.0–52.0)
Hemoglobin: 12.8 g/dL — ABNORMAL LOW (ref 13.0–17.0)
MCH: 28.6 pg (ref 26.0–34.0)
MCHC: 32.5 g/dL (ref 30.0–36.0)
MCV: 87.9 fL (ref 80.0–100.0)
Platelets: 207 10*3/uL (ref 150–400)
RBC: 4.48 MIL/uL (ref 4.22–5.81)
RDW: 16.7 % — ABNORMAL HIGH (ref 11.5–15.5)
WBC: 6.2 10*3/uL (ref 4.0–10.5)
nRBC: 0 % (ref 0.0–0.2)

## 2023-06-23 LAB — BASIC METABOLIC PANEL
Anion gap: 10 (ref 5–15)
BUN: 12 mg/dL (ref 6–20)
CO2: 18 mmol/L — ABNORMAL LOW (ref 22–32)
Calcium: 8.6 mg/dL — ABNORMAL LOW (ref 8.9–10.3)
Chloride: 106 mmol/L (ref 98–111)
Creatinine, Ser: 0.73 mg/dL (ref 0.61–1.24)
GFR, Estimated: >60 mL/min (ref >60)
Glucose, Bld: 154 mg/dL — ABNORMAL HIGH (ref 70–99)
Potassium: 3.5 mmol/L (ref 3.5–5.1)
Sodium: 134 mmol/L — ABNORMAL LOW (ref 135–145)

## 2023-06-23 LAB — CBG MONITORING, ED: Glucose-Capillary: 154 mg/dL — ABNORMAL HIGH (ref 70–99)

## 2023-06-23 MED ORDER — SODIUM CHLORIDE 0.9 % IV BOLUS
1000.0000 mL | Freq: Once | INTRAVENOUS | Status: AC
  Administered 2023-06-23: 1000 mL via INTRAVENOUS

## 2023-06-23 MED ORDER — VALPROIC ACID 250 MG PO CAPS
750.0000 mg | ORAL_CAPSULE | Freq: Every day | ORAL | 2 refills | Status: AC
  Filled 2023-06-23: qty 90, 30d supply, fill #0

## 2023-06-23 MED ORDER — VALPROATE SODIUM 100 MG/ML IV SOLN
500.0000 mg | Freq: Once | INTRAVENOUS | Status: AC
  Administered 2023-06-23: 500 mg via INTRAVENOUS
  Filled 2023-06-23: qty 5

## 2023-06-23 NOTE — Discharge Instructions (Signed)
I sent a prescription to our hospital pharmacy for your seizure medication.  Thank you for choosing [place] for your health care today!  Please see your primary doctor this week for a follow up appointment.   If you have any new, worsening, or unexpected symptoms call your doctor right away or come back to the emergency department for reevaluation.  It was my pleasure to care for you today.   Daneil Dan Modesto Charon, MD

## 2023-06-23 NOTE — ED Provider Notes (Signed)
[facility] Provider Note    Event Date/Time   First MD Initiated Contact with Patient 06/23/23 0402     (approximate)   History   Seizures   HPI  Alex [name] is a 47 y.o. male   Past medical history of polysubstance use, cocaine alcohol use, anxiety and depression and seizures on Depakote who presents the emergency department with seizure-like activity in jail today.  Whole body shaking brief with no tongue bite or incontinence.  Patient states that he does not remember this occurrence.  No trauma noted.  He does have left shoulder pain.  He drank alcohol earlier today.  No drug use reported.  He does state that he has been out of his seizure medications for an unknown period of time due to his inability to afford them.  Independent Historian contributed to assessment above: Emergency planning/management officer who accompanies the patient corroborates information given above       Physical Exam   Triage Vital Signs: ED Triage Vitals [06/23/23 0400]  Encounter Vitals Group     BP 122/73     Systolic BP Percentile      Diastolic BP Percentile      Pulse Rate 85     Resp (!) 21     Temp 97.9 F (36.6 C)     Temp Source Oral     SpO2 96 %     Weight      Height      Head Circumference      Peak Flow      Pain Score      Pain Loc      Pain Education      Exclude from Growth Chart     Most recent vital signs: Vitals:   06/23/23 0400 06/23/23 0401  BP: 122/73   Pulse: 85   Resp: (!) 21   Temp: 97.9 F (36.6 C)   SpO2: 96% 94%    General: Awake, no distress.  CV:  Good peripheral perfusion.  Resp:  Normal effort.  Abd:  No distention.  Other:  Slightly slurred speech, moving all extremities, no obvious deformity to the affected left shoulder, neurovascular intact to the fact extremity, no obvious head trauma.  Hemodynamics appropriate reassuring.  Awake alert oriented.   ED Results / Procedures / Treatments   Labs (all labs ordered are  listed, but only abnormal results are displayed) Labs Reviewed  BASIC METABOLIC PANEL - Abnormal; Notable for the following components:      Result Value   Sodium 134 (*)    CO2 18 (*)    Glucose, Bld 154 (*)    Calcium 8.6 (*)    All other components within normal limits  CBC - Abnormal; Notable for the following components:   Hemoglobin 12.8 (*)    RDW 16.7 (*)    All other components within normal limits  ETHANOL  CBG MONITORING, ED     I ordered and reviewed the above labs they are notable for blood sugar is normal and electrolytes and cell counts largely unremarkable  EKG  ED ECG REPORT I, Pilar Jarvis, the attending physician, personally viewed and interpreted this ECG.   Date: 06/23/2023  EKG Time: 0404  Rate: 77  Rhythm: nsr  Axis: nl  Intervals:none  ST&T Change: no stemi    RADIOLOGY I independently reviewed and interpreted x-ray of the shoulder and see no obvious fracture I also reviewed radiologist's formal read.   PROCEDURES:  Critical Care performed: No  Procedures   MEDICATIONS ORDERED IN ED: Medications  valproate (DEPACON) 500 mg in dextrose 5 % 50 mL IVPB (has no administration in time range)  sodium chloride 0.9 % bolus 1,000 mL (1,000 mLs Intravenous New Bag/Given 06/23/23 0432)    IMPRESSION / MDM / ASSESSMENT AND PLAN / ED COURSE  I reviewed the triage vital signs and the nursing notes.                                Patient's presentation is most consistent with acute presentation with potential threat to life or bodily function.  Differential diagnosis includes, but is not limited to, seizure due to medication nonadherence, drug use,  considered sz due to withdrawal but less likely, fracture of the shoulder dislocation, ICH, electrolyte disturbance    MDM: Reported seizure-like activity from jail.  Looks well now.  No obvious signs of trauma though reports left shoulder pain will get an x-ray to check for fractures or dislocations.   Get a head scan to check for ICH.  Check electrolytes.  Looks more intoxicated than withdrawal.    He did have a true seizure is likely in the setting of medication nonadherence.  Will give an IV dose of his valproic acid here, fluids, and workup as above and if unremarkable plan will be for discharge in police custody.  -- Workup unremarkable.  Discharge     FINAL CLINICAL IMPRESSION(S) / ED DIAGNOSES   Final diagnoses:  None     Rx / DC Orders   ED Discharge Orders     None        Note:  This document was prepared using Dragon voice recognition software and may include unintentional dictation errors.    Pilar Jarvis, MD 06/23/23 [phone]

## 2023-06-23 NOTE — ED Notes (Signed)
Provided pt with discharge instructions and education. All of pt questions answered. Pt in possession of all belongings. Pt AAOX4 and stable at time of discharge.Pt ambulated w/ steady gait towards ED exit in handcuffs under sheriff custody.

## 2023-06-23 NOTE — ED Notes (Signed)
CBG 154.

## 2023-06-23 NOTE — ED Triage Notes (Signed)
Pt BIB EMS, pt was being booked when he began having seizure-like activity that lasted longer than 1 min per sheriff. Pt initial BP was 89/49. Repeat BP after fluids 98/54. Reported etoh and cocaine use today. Pt arrives awakes and able to answer questions. C/o left shoulder pain.   Hx of seizures

## 2023-06-30 ENCOUNTER — Other Ambulatory Visit: Payer: Self-pay

## 2023-06-30 ENCOUNTER — Emergency Department: Admitting: General Practice

## 2023-06-30 ENCOUNTER — Encounter: Admission: EM | Payer: Self-pay | Source: Home / Self Care | Attending: Emergency Medicine

## 2023-06-30 ENCOUNTER — Emergency Department

## 2023-06-30 ENCOUNTER — Ambulatory Visit
Admission: EM | Admit: 2023-06-30 | Discharge: 2023-06-30 | Attending: Emergency Medicine | Admitting: Emergency Medicine

## 2023-06-30 DIAGNOSIS — F419 Anxiety disorder, unspecified: Secondary | ICD-10-CM | POA: Diagnosis not present

## 2023-06-30 DIAGNOSIS — M24412 Recurrent dislocation, left shoulder: Secondary | ICD-10-CM | POA: Diagnosis present

## 2023-06-30 DIAGNOSIS — F17210 Nicotine dependence, cigarettes, uncomplicated: Secondary | ICD-10-CM | POA: Diagnosis not present

## 2023-06-30 DIAGNOSIS — F32A Depression, unspecified: Secondary | ICD-10-CM | POA: Diagnosis not present

## 2023-06-30 DIAGNOSIS — X500XXA Overexertion from strenuous movement or load, initial encounter: Secondary | ICD-10-CM | POA: Diagnosis not present

## 2023-06-30 DIAGNOSIS — E669 Obesity, unspecified: Secondary | ICD-10-CM | POA: Diagnosis not present

## 2023-06-30 DIAGNOSIS — R569 Unspecified convulsions: Secondary | ICD-10-CM | POA: Insufficient documentation

## 2023-06-30 DIAGNOSIS — Z6836 Body mass index (BMI) 36.0-36.9, adult: Secondary | ICD-10-CM | POA: Diagnosis not present

## 2023-06-30 DIAGNOSIS — S4292XA Fracture of left shoulder girdle, part unspecified, initial encounter for closed fracture: Secondary | ICD-10-CM

## 2023-06-30 HISTORY — PX: SHOULDER CLOSED REDUCTION: SHX1051

## 2023-06-30 SURGERY — CLOSED REDUCTION, SHOULDER
Anesthesia: General | Site: Shoulder | Laterality: Left

## 2023-06-30 MED ORDER — PROPOFOL 10 MG/ML IV BOLUS
INTRAVENOUS | Status: AC
  Filled 2023-06-30: qty 20

## 2023-06-30 MED ORDER — MIDAZOLAM HCL 2 MG/2ML IJ SOLN
INTRAMUSCULAR | Status: DC | PRN
  Administered 2023-06-30 (×2): 1 mg via INTRAVENOUS

## 2023-06-30 MED ORDER — LACTATED RINGERS IV SOLN
INTRAVENOUS | Status: DC | PRN
Start: 2023-06-30 — End: 2023-07-11

## 2023-06-30 MED ORDER — PROPOFOL 10 MG/ML IV BOLUS
1.0000 mg/kg | Freq: Once | INTRAVENOUS | Status: AC
  Administered 2023-06-30: 30 mg via INTRAVENOUS
  Administered 2023-06-30: 100 mg via INTRAVENOUS
  Administered 2023-06-30: 200 mg via INTRAVENOUS
  Administered 2023-06-30: 100 mg via INTRAVENOUS
  Administered 2023-06-30: 50 mg via INTRAVENOUS
  Filled 2023-06-30: qty 20

## 2023-06-30 MED ORDER — FENTANYL CITRATE (PF) 100 MCG/2ML IJ SOLN
INTRAMUSCULAR | Status: AC
  Filled 2023-06-30: qty 2

## 2023-06-30 MED ORDER — ACETAMINOPHEN 500 MG PO TABS
1000.0000 mg | ORAL_TABLET | Freq: Three times a day (TID) | ORAL | 2 refills | Status: AC
Start: 2023-06-30 — End: 2024-06-29

## 2023-06-30 MED ORDER — OXYCODONE HCL 5 MG/5ML PO SOLN: 5.0000 mg | Freq: Once | ORAL | Status: DC | PRN

## 2023-06-30 MED ORDER — PROPOFOL 10 MG/ML IV BOLUS
INTRAVENOUS | Status: AC | PRN
Start: 2023-06-30 — End: 2023-06-30
  Administered 2023-06-30: 25 mg via INTRAVENOUS
  Administered 2023-06-30 (×3): 50 mg via INTRAVENOUS

## 2023-06-30 MED ORDER — FENTANYL CITRATE PF 50 MCG/ML IJ SOSY
PREFILLED_SYRINGE | INTRAMUSCULAR | Status: AC
  Filled 2023-06-30: qty 2

## 2023-06-30 MED ORDER — OXYCODONE HCL 5 MG PO TABS: 5.0000 mg | ORAL_TABLET | Freq: Once | ORAL | Status: DC | PRN

## 2023-06-30 MED ORDER — LIDOCAINE HCL (PF) 1 % IJ SOLN
30.0000 mL | Freq: Once | INTRAMUSCULAR | Status: AC
  Administered 2023-06-30: 20 mL
  Filled 2023-06-30: qty 30

## 2023-06-30 MED ORDER — FENTANYL CITRATE (PF) 100 MCG/2ML IJ SOLN
INTRAMUSCULAR | Status: AC | PRN
Start: 2023-06-30 — End: 2023-06-30
  Administered 2023-06-30: 100 ug via INTRAVENOUS

## 2023-06-30 MED ORDER — FENTANYL CITRATE (PF) 100 MCG/2ML IJ SOLN: 25.0000 ug | INTRAMUSCULAR | Status: DC | PRN

## 2023-06-30 MED ORDER — FENTANYL CITRATE (PF) 100 MCG/2ML IJ SOLN
INTRAMUSCULAR | Status: DC | PRN
  Administered 2023-06-30 (×2): 50 ug via INTRAVENOUS

## 2023-06-30 MED ORDER — PROPOFOL 10 MG/ML IV BOLUS
INTRAVENOUS | Status: AC | PRN
Start: 2023-06-30 — End: 2023-06-30
  Administered 2023-06-30: 100 mg via INTRAVENOUS

## 2023-06-30 MED ORDER — SUCCINYLCHOLINE CHLORIDE 200 MG/10ML IV SOSY
PREFILLED_SYRINGE | INTRAVENOUS | Status: DC | PRN
Start: 2023-06-30 — End: 2023-07-11
  Administered 2023-06-30: 100 mg via INTRAVENOUS

## 2023-06-30 MED ORDER — PROPOFOL 10 MG/ML IV BOLUS
INTRAVENOUS | Status: AC
  Filled 2023-06-30: qty 40

## 2023-06-30 MED ORDER — MIDAZOLAM HCL 2 MG/2ML IJ SOLN
INTRAMUSCULAR | Status: AC
  Filled 2023-06-30: qty 2

## 2023-06-30 MED ORDER — MELOXICAM 15 MG PO TABS
15.0000 mg | ORAL_TABLET | Freq: Every day | ORAL | 2 refills | Status: AC
Start: 2023-06-30 — End: 2024-06-29

## 2023-06-30 MED ORDER — SODIUM CHLORIDE 0.9 % IV BOLUS
1000.0000 mL | Freq: Once | INTRAVENOUS | Status: AC
  Administered 2023-06-30: 1000 mL via INTRAVENOUS

## 2023-06-30 MED ORDER — SUCCINYLCHOLINE CHLORIDE 200 MG/10ML IV SOSY
PREFILLED_SYRINGE | INTRAVENOUS | Status: AC
  Filled 2023-06-30: qty 10

## 2023-06-30 MED ORDER — PROPOFOL 500 MG/50ML IV EMUL
INTRAVENOUS | Status: AC | PRN
Start: 2023-06-30 — End: 2023-06-30
  Administered 2023-06-30: 100 mg via INTRAVENOUS

## 2023-06-30 SURGICAL SUPPLY — 2 items
KIT TURNOVER KIT A (KITS) IMPLANT
SLING ARM LRG DEEP (SOFTGOODS) IMPLANT

## 2023-06-30 NOTE — Op Note (Signed)
SURGERY DATE: 06/30/2023     PRE-OP DIAGNOSIS:  1.  Left recurrent glenohumeral dislocation   POST-OP DIAGNOSIS:  1.  Left recurrent glenohumeral dislocation   PROCEDURE(S): 1. Closed reduction of left glenohumeral dislocation under anesthesia   SURGEON: Rosealee Albee, MD    ANESTHESIA: Gen   ESTIMATED BLOOD LOSS: none   TOTAL IV FLUIDS: none   INDICATION(S): Alex [name] is a 47 y.o. male with a history of multiple (~30) glenohumeral joint dislocation episodes. He was stretching his arms overhead yesterday and felt his shoulder dislocate. He is currently in jail.  Radiographs showed anterior glenohumeral joint dislocation. Closed reduction attempt by ED staff was unsuccessful. After discussion of risks, benefits, and alternatives to surgery, the patient elected to proceed with above procedure.   OPERATIVE FINDINGS: Anterior inferior glenohumeral dislocation    OPERATIVE REPORT:   The patient was seen in the Holding Room. The patient concurred with the proposed plan, giving informed consent. The site of surgery was properly noted/marked. The patient was taken to Operating Room. A Time Out was held and the patient identity, procedure, and laterality was confirmed. After administration of adequate anesthesia, FARES reduction maneuver was utilized and reduction was achieved. Range of motion was normalized. The shoulder was stable through a functional range of motion.  Fluoroscopy confirmed glenohumeral joint reduction. Patient was placed in a sling. The patient was awakened from anesthesia without any further complication and transferred to PACU for further recovery.     POST-OPERATIVE PLAN:  Patient may be discharged home. The patient will be NWB on operative extremity.  Maintain shoulder immobilizer at all times x 3 weeks.

## 2023-06-30 NOTE — Discharge Instructions (Addendum)
  Post-Op Instructions   1. Sling: Wear sling at all times x 3 weeks. Afterwards, avoid any positions with arm overhead and elbow out to the side.   2. Splint/Cast: none  3. Activity:  Non-weight bearing. Keep arm in sling.   4. Medications: Can take meloxicam and/or tylenol as needed for pain.    5. Physical Therapy: not needed   6. Work/School: May return to full work/school when able to perform activities with use of one arm only.   7. Post-Op Appointments: You can call [phone] for a follow up appointment in around 2-3 weeks.           \    AMBULATORY SURGERY  DISCHARGE INSTRUCTIONS   The drugs that you were given will stay in your system until tomorrow so for the next 24 hours you should not:  Drive an automobile Make any legal decisions Drink any alcoholic beverage   You may resume regular meals tomorrow.  Today it is better to start with liquids and gradually work up to solid foods.  You may eat anything you prefer, but it is better to start with liquids, then soup and crackers, and gradually work up to solid foods.   Please notify your doctor immediately if you have any unusual bleeding, trouble breathing, redness and pain at the surgery site, drainage, fever, or pain not relieved by medication.  Please contact your physician with any problems or Same Day Surgery at [phone], Monday through Friday 6 am to 4 pm, or [place] [facility] number at [phone].

## 2023-06-30 NOTE — ED Triage Notes (Signed)
Pt sts that he has had a dislocated left shoulder in the past. Pt sts that he was stretching yesterday and it popped out. Pt sts that his shoulder has been out of the socket since 1400 yesterday.

## 2023-06-30 NOTE — ED Provider Notes (Signed)
[facility] Provider Note    Event Date/Time   First MD Initiated Contact with Patient 06/30/23 1505     (approximate)   History   Shoulder Injury   HPI  Alex [name] is a 47 y.o. male presents to the emergency department from jail for left shoulder pain.  Patient states that he has had multiple episodes of shoulder dislocation in the past.  Was stretching yesterday morning and felt like his shoulder popped out.  States that it has been out of the socket since yesterday around early afternoon.  States that he told the jail however he was not taken to the the emergency department till today.  Multiple episodes in the past of left shoulder dislocation.  Denies any fall or trauma.     Physical Exam   Triage Vital Signs: ED Triage Vitals [06/30/23 1333]  Encounter Vitals Group     BP (!) 136/100     Systolic BP Percentile      Diastolic BP Percentile      Pulse Rate 89     Resp 17     Temp 98.7 F (37.1 C)     Temp Source Oral     SpO2 95 %     Weight 224 lb (101.6 kg)     Height 5\' 6"  (1.676 m)     Head Circumference      Peak Flow      Pain Score 10     Pain Loc      Pain Education      Exclude from Growth Chart     Most recent vital signs: Vitals:   06/30/23 1620 06/30/23 1625  BP: 113/75 94/76  Pulse: 94 80  Resp: (!) 31 (!) 31  Temp:    SpO2: 100% 97%    Physical Exam Constitutional:      Appearance: He is well-developed.  HENT:     Head: Atraumatic.  Eyes:     Conjunctiva/sclera: Conjunctivae normal.  Cardiovascular:     Rate and Rhythm: Regular rhythm.  Pulmonary:     Effort: No respiratory distress.  Musculoskeletal:     Cervical back: Normal range of motion.     Comments: Deformity to the left shoulder.  +2 radial pulses.  Intact sensation to the left upper extremity, lower extremity, good capillary refill.  Skin:    General: Skin is warm.  Neurological:     Mental Status: He is alert. Mental status is at baseline.       IMPRESSION / MDM / ASSESSMENT AND PLAN / ED COURSE  I reviewed the triage vital signs and the nursing notes.  Differential diagnosis including shoulder dislocation, fracture, musculoskeletal pain   RADIOLOGY I independently reviewed imaging, my interpretation of imaging: Left shoulder dislocation -anterior and inferior   Labs (all labs ordered are listed, but only abnormal results are displayed) Labs interpreted as -    Labs Reviewed - No data to display    Multiple attempts at procedural sedation with relocation of the left shoulder that was ultimately unsuccessful.  Consulted orthopedics and discussed with Dr. Allena Katz, plan for OR fixation of the left shoulder.   PROCEDURES:  Critical Care performed: No  .Ortho Injury Treatment  Date/Time: 06/30/2023 4:37 PM  Performed by: Corena Herter, MD Authorized by: Corena Herter, MD   Consent:    Consent obtained:  Verbal and written   Consent given by:  Patient   Risks discussed:  Fracture, nerve damage, restricted joint  movement and vascular damage   Alternatives discussed:  No treatment and delayed treatmentInjury location: shoulder Location details: left shoulder Injury type: dislocation Chronicity: recurrent Pre-procedure neurovascular assessment: neurovascularly intact Pre-procedure distal perfusion: normal Pre-procedure range of motion: reduced  Patient sedated: Yes. Refer to sedation procedure documentation for details of sedation. Manipulation performed: yes Reduction method: external rotation, traction and counter traction and scapular manipulation Reduction successful: no Post-procedure neurovascular assessment: post-procedure neurovascularly intact Post-procedure distal perfusion: normal Post-procedure neurological function: normal Post-procedure range of motion: unchanged   .Sedation  Date/Time: 06/30/2023 4:37 PM  Performed by: Corena Herter, MD Authorized by: Corena Herter, MD   Consent:     Consent obtained:  Verbal   Consent given by:  Patient   Risks discussed:  Allergic reaction, dysrhythmia, inadequate sedation, nausea, prolonged hypoxia resulting in organ damage, prolonged sedation necessitating reversal, respiratory compromise necessitating ventilatory assistance and intubation and vomiting   Alternatives discussed:  Analgesia without sedation, anxiolysis and regional anesthesia Universal protocol:    Procedure explained and questions answered to patient or proxy's satisfaction: yes     Relevant documents present and verified: yes     Test results available: yes     Imaging studies available: yes     Required blood products, implants, devices, and special equipment available: yes     Site/side marked: yes     Immediately prior to procedure, a time out was called: yes     Patient identity confirmed:  Verbally with patient Indications:    Procedure necessitating sedation performed by:  Physician performing sedation Pre-sedation assessment:    Time since last food or drink:  This am   NPO status caution: unable to specify NPO status     ASA classification: class 1 - normal, healthy patient     Mouth opening:  3 or more finger widths   Thyromental distance:  4 finger widths   Mallampati score:  I - soft palate, uvula, fauces, pillars visible   Neck mobility: normal     Pre-sedation assessments completed and reviewed: airway patency, cardiovascular function, hydration status, mental status, nausea/vomiting, pain level, respiratory function and temperature   Immediate pre-procedure details:    Reassessment: Patient reassessed immediately prior to procedure     Reviewed: vital signs, relevant labs/tests and NPO status     Verified: bag valve mask available, emergency equipment available, intubation equipment available, IV patency confirmed, oxygen available and suction available   Procedure details (see MAR for exact dosages):    Preoxygenation:  Nasal cannula   Sedation:   Propofol   Intended level of sedation: deep   Intra-procedure monitoring:  Blood pressure monitoring, cardiac monitor, continuous pulse oximetry, frequent LOC assessments, frequent vital sign checks and continuous capnometry   Intra-procedure events: none     Total Provider sedation time (minutes):  30 Post-procedure details:    Post-sedation assessment completed:  06/30/2023 4:38 PM   Attendance: Constant attendance by certified staff until patient recovered     Recovery: Patient returned to pre-procedure baseline     Post-sedation assessments completed and reviewed: airway patency, cardiovascular function, hydration status, mental status, nausea/vomiting, pain level, respiratory function and temperature     Patient is stable for discharge or admission: yes     Procedure completion:  Tolerated well, no immediate complications   Patient's presentation is most consistent with acute presentation with potential threat to life or bodily function.   MEDICATIONS ORDERED IN ED: Medications  propofol (DIPRIVAN) 10 mg/mL bolus/IV push 101.6 mg (has  no administration in time range)  propofol (DIPRIVAN) 500 MG/50ML infusion (100 mg Intravenous New Bag/Given 06/30/23 1556)  propofol (DIPRIVAN) 10 mg/mL bolus/IV push (50 mg Intravenous Given 06/30/23 1618)  propofol (DIPRIVAN) 10 mg/mL bolus/IV push (has no administration in time range)  fentaNYL (SUBLIMAZE) injection (100 mcg Intravenous Given 06/30/23 1617)  fentaNYL (SUBLIMAZE) 50 MCG/ML injection (has no administration in time range)  lidocaine (PF) (XYLOCAINE) 1 % injection 30 mL (30 mLs Other Given 06/30/23 1553)  sodium chloride 0.9 % bolus 1,000 mL (1,000 mLs Intravenous New Bag/Given 06/30/23 1553)    FINAL CLINICAL IMPRESSION(S) / ED DIAGNOSES   Final diagnoses:  Traumatic closed displaced fracture of left shoulder with anterior dislocation, initial encounter     Rx / DC Orders   ED Discharge Orders     None        Note:  This  document was prepared using Dragon voice recognition software and may include unintentional dictation errors.   Corena Herter, MD 06/30/23 [phone]

## 2023-06-30 NOTE — Transfer of Care (Signed)
Immediate Anesthesia Transfer of Care Note  Patient: Alex [name]  Procedure(s) Performed: CLOSED REDUCTION SHOULDER (Left: Shoulder)  Patient Location: PACU  Anesthesia Type:General  Level of Consciousness: drowsy  Airway & Oxygen Therapy: Patient Spontanous Breathing  Post-op Assessment: Report given to RN  Post vital signs: stable  Last Vitals:  Vitals Value Taken Time  BP 157/99 06/30/23 2110  Temp 36.2 C 06/30/23 2110  Pulse 89 06/30/23 2111  Resp 18 06/30/23 2111  SpO2 99 % 06/30/23 2111  Vitals shown include unfiled device data.  Last Pain:  Vitals:   06/30/23 1538  TempSrc: Oral  PainSc: 10-Worst pain ever         Complications: No notable events documented.

## 2023-06-30 NOTE — H&P (Signed)
HISTORY & PHYSICAL  Subjective:  Chief complaint: Left shoulder pain  The patient is a left-handed 47 y.o. male who was stretching his arm overhead yesterday afternoon when he felt his shoulder dislocate.  He states that he has had around 30 prior glenohumeral dislocations, the last one occurring approximately 2 months ago.  He is currently in jail.  No current numbness or tingling.  ED staff attempted closed reduction but was unsuccessful.  Patient Active Problem List   Diagnosis Date Noted   Cocaine abuse with cocaine-induced mood disorder (HCC) 05/23/2023   Abdominal wall cellulitis 05/11/2023   Suicidal ideations 05/11/2023   Alcohol abuse with intoxication (HCC) 05/11/2023   Malingering 05/04/2023   Neuropathy 05/04/2023   Anterior dislocation of left shoulder 03/14/2023   Syncope 10/10/2022   Depression with anxiety 10/10/2022   Testicular pain 10/10/2022   Obesity (BMI 30-39.9) 10/10/2022   Polysubstance abuse (HCC) 10/10/2022   Alcohol abuse 10/10/2022   Substance induced mood disorder (HCC) 12/30/2016   Moderate recurrent major depression (HCC) 12/30/2016   Cocaine abuse (HCC)    ADHD 08/11/2016   Alcohol dependence with unspecified alcohol-induced disorder (HCC) 06/28/2016   Alcohol withdrawal (HCC) 06/28/2016   Cocaine use disorder, moderate, dependence (HCC) 06/28/2016   Depression 06/28/2016   Tobacco use disorder 06/28/2016   Past Medical History:  Diagnosis Date   Alcohol abuse    Anxiety    Cocaine abuse (HCC) 05/27/2016   Depression    Seizures (HCC)     Past Surgical History:  Procedure Laterality Date   FINGER SURGERY     SHOULDER CLOSED REDUCTION Left 03/14/2023   Procedure: Radiological Exam in OR;  Surgeon: Reinaldo Berber, MD;  Location: [facility] ORS;  Service: Orthopedics;  Laterality: Left;    (Not in a hospital admission)  Allergies  Allergen Reactions   Other Itching    Peanut Butter   Peanut Butter Flavor     Other Reaction(s): Unknown     Social History   Tobacco Use   Smoking status: Every Day    Current packs/day: 1.00    Types: Cigarettes, E-cigarettes   Smokeless tobacco: Never  Substance Use Topics   Alcohol use: Yes    Alcohol/week: 4.0 - 5.0 standard drinks of alcohol    Types: 4 - 5 Cans of beer per week    No family history on file.   Review of Systems: As noted above. The patient denies any chest pain, shortness of breath, nausea, vomiting, diarrhea, constipation, belly pain, blood in his/her stool, or burning with urination.  Objective: Temp:  [98.7 F (37.1 C)] 98.7 F (37.1 C) (10/03 1333) Pulse Rate:  [56-94] 80 (10/03 1625) Resp:  [15-31] 31 (10/03 1625) BP: (94-159)/(75-132) 94/76 (10/03 1625) SpO2:  [95 %-100 %] 97 % (10/03 1625) Weight:  [101.6 kg] 101.6 kg (10/03 1333)  Physical Exam: General:  Alert, no acute distress Psychiatric:  Patient is competent for consent with normal mood and affect   LUE: +ain/pin/u motor SILT r/u/m/ax +rad pulse RoM minimal: ER to 30 passive, neutral IR, FF to 50 passive before significant pain.  Palpable humeral head anteriorly   Imaging Review: Recent x-rays of the Left shoulder are available for review.  These films demonstrate anterior/inferior glenohumeral dislocation with persistent dislocation on follow up radiographs.  Assessment: L recurrent glenohumeral dislocation  Plan: We discussed the imaging findings as well as his clinical picture.  Per him, he has now had around 30 glenohumeral dislocations, last 1 occurring 2 months  ago that required closed reduction in the emergency department.  Closed reduction the Emergency Department today was unsuccessful.  Therefore, I recommended taken the patient to the operating room for closed reduction of the left shoulder under general anesthesia and paralytic.  Patient in agreement with this plan after discussion of risk, benefits, and alternatives.   Signa Kell  06/30/2023 4:37 PM

## 2023-06-30 NOTE — Anesthesia Preprocedure Evaluation (Signed)
Anesthesia Evaluation  Patient identified by MRN, date of birth, ID band Patient awake  General Assessment Comment:Patient AO x 3, though appears sleepy. Able to answer all my questions. Appears to have capacity to consent  Reviewed: Allergy & Precautions, NPO status , Patient's Chart, lab work & pertinent test results  History of Anesthesia Complications Negative for: history of anesthetic complications  Airway Mallampati: III  TM Distance: >3 FB Neck ROM: Full    Dental  (+) Poor Dentition, Dental Advidsory Given   Pulmonary neg sleep apnea, neg COPD, Current Smoker and Patient abstained from smoking.   Pulmonary exam normal breath sounds clear to auscultation       Cardiovascular Exercise Tolerance: Good (-) hypertension(-) CAD and (-) Past MI negative cardio ROS (-) dysrhythmias  Rhythm:Regular Rate:Normal - Systolic murmurs    Neuro/Psych  PSYCHIATRIC DISORDERS Anxiety Depression       GI/Hepatic ,neg GERD  ,,(+)     substance abuse  alcohol use and cocaine useDenies cocaine use today   Endo/Other  neg diabetes    Renal/GU      Musculoskeletal   Abdominal   Peds  Hematology   Anesthesia Other Findings Past Medical History: No date: Alcohol abuse No date: Anxiety 05/27/2016: Cocaine abuse (HCC) No date: Depression No date: Seizures (HCC)  Reproductive/Obstetrics                             Anesthesia Physical Anesthesia Plan  ASA: 3  Anesthesia Plan: General   Post-op Pain Management: Minimal or no pain anticipated   Induction: Intravenous  PONV Risk Score and Plan: 3 and Propofol infusion, TIVA and Ondansetron  Airway Management Planned: Nasal Cannula  Additional Equipment: None  Intra-op Plan:   Post-operative Plan:   Informed Consent: I have reviewed the patients History and Physical, chart, labs and discussed the procedure including the risks, benefits and  alternatives for the proposed anesthesia with the patient or authorized representative who has indicated his/her understanding and acceptance.     Dental advisory given  Plan Discussed with: CRNA and Surgeon  Anesthesia Plan Comments: (Discussed risks of anesthesia with patient, including possibility of difficulty with spontaneous ventilation under anesthesia necessitating airway intervention, PONV, and rare risks such as cardiac or respiratory or neurological events, and allergic reactions. Discussed the role of CRNA in patient's perioperative care. Patient understands.)       Anesthesia Quick Evaluation

## 2023-06-30 NOTE — ED Notes (Addendum)
Deputy at bedside

## 2023-07-01 ENCOUNTER — Encounter: Payer: Self-pay | Admitting: Orthopedic Surgery

## 2023-07-04 ENCOUNTER — Other Ambulatory Visit: Payer: Self-pay

## 2023-07-11 ENCOUNTER — Encounter: Payer: Self-pay | Admitting: Orthopedic Surgery

## 2023-07-11 NOTE — Anesthesia Postprocedure Evaluation (Signed)
Anesthesia Post Note  Patient: Alex [name]  Procedure(s) Performed: CLOSED REDUCTION SHOULDER (Left: Shoulder)  Patient location during evaluation: PACU Anesthesia Type: General Level of consciousness: awake and alert Pain management: pain level controlled Vital Signs Assessment: post-procedure vital signs reviewed and stable Respiratory status: spontaneous breathing, nonlabored ventilation, respiratory function stable and patient connected to nasal cannula oxygen Cardiovascular status: blood pressure returned to baseline and stable Postop Assessment: no apparent nausea or vomiting Anesthetic complications: no   No notable events documented.   Last Vitals:  Vitals:   06/30/23 2130 06/30/23 2150  BP: (!) 166/93 (!) 148/79  Pulse: 72 72  Resp: 18 18  Temp: 36.4 C   SpO2: 96% 97%    Last Pain:  Vitals:   06/30/23 2150  TempSrc:   PainSc: 0-No pain                 Stephanie Coup

## 2023-07-15 ENCOUNTER — Other Ambulatory Visit: Payer: Self-pay | Admitting: Orthopedic Surgery

## 2023-07-15 DIAGNOSIS — M24412 Recurrent dislocation, left shoulder: Secondary | ICD-10-CM

## 2023-07-18 ENCOUNTER — Other Ambulatory Visit: Payer: Self-pay | Admitting: Orthopedic Surgery

## 2023-07-18 DIAGNOSIS — M24412 Recurrent dislocation, left shoulder: Secondary | ICD-10-CM

## 2023-07-21 NOTE — Plan of Care (Signed)
[facility] Tonsillectomy/Adenoidectomy, Postoperative PEDS care plan entered in error.

## 2023-07-22 ENCOUNTER — Encounter: Payer: Self-pay | Admitting: Emergency Medicine

## 2023-07-22 ENCOUNTER — Other Ambulatory Visit: Payer: Self-pay

## 2023-07-22 ENCOUNTER — Emergency Department
Admission: EM | Admit: 2023-07-22 | Discharge: 2023-07-22 | Disposition: A | Discharge status: Home / Self Care | Payer: MEDICAID | Attending: Emergency Medicine | Admitting: Emergency Medicine

## 2023-07-22 DIAGNOSIS — R45851 Suicidal ideations: Secondary | ICD-10-CM | POA: Diagnosis not present

## 2023-07-22 DIAGNOSIS — F32A Depression, unspecified: Secondary | ICD-10-CM | POA: Diagnosis present

## 2023-07-22 LAB — SALICYLATE LEVEL: Salicylate Lvl: <7 mg/dL — ABNORMAL LOW (ref 7.0–30.0)

## 2023-07-22 LAB — URINE DRUG SCREEN, QUALITATIVE (ARMC ONLY)
Amphetamines, Ur Screen: NOT DETECTED
Barbiturates, Ur Screen: NOT DETECTED
Benzodiazepine, Ur Scrn: NOT DETECTED
Cannabinoid 50 Ng, Ur ~~LOC~~: POSITIVE — AB
Cocaine Metabolite,Ur ~~LOC~~: POSITIVE — AB
MDMA (Ecstasy)Ur Screen: NOT DETECTED
Methadone Scn, Ur: NOT DETECTED
Opiate, Ur Screen: NOT DETECTED
Phencyclidine (PCP) Ur S: NOT DETECTED
Tricyclic, Ur Screen: NOT DETECTED

## 2023-07-22 LAB — CBC
HCT: 44.5 % (ref 39.0–52.0)
Hemoglobin: 14.3 g/dL (ref 13.0–17.0)
MCH: 28 pg (ref 26.0–34.0)
MCHC: 32.1 g/dL (ref 30.0–36.0)
MCV: 87.1 fL (ref 80.0–100.0)
Platelets: 256 10*3/uL (ref 150–400)
RBC: 5.11 MIL/uL (ref 4.22–5.81)
RDW: 14.6 % (ref 11.5–15.5)
WBC: 7.9 10*3/uL (ref 4.0–10.5)
nRBC: 0 % (ref 0.0–0.2)

## 2023-07-22 LAB — ETHANOL: Alcohol, Ethyl (B): 90 mg/dL — ABNORMAL HIGH (ref <10)

## 2023-07-22 LAB — COMPREHENSIVE METABOLIC PANEL
ALT: 21 U/L (ref 0–44)
AST: 19 U/L (ref 15–41)
Albumin: 4 g/dL (ref 3.5–5.0)
Alkaline Phosphatase: 74 U/L (ref 38–126)
Anion gap: 13 (ref 5–15)
BUN: 19 mg/dL (ref 6–20)
CO2: 22 mmol/L (ref 22–32)
Calcium: 9.1 mg/dL (ref 8.9–10.3)
Chloride: 100 mmol/L (ref 98–111)
Creatinine, Ser: 0.67 mg/dL (ref 0.61–1.24)
GFR, Estimated: >60 mL/min (ref >60)
Glucose, Bld: 131 mg/dL — ABNORMAL HIGH (ref 70–99)
Potassium: 3.3 mmol/L — ABNORMAL LOW (ref 3.5–5.1)
Sodium: 135 mmol/L (ref 135–145)
Total Bilirubin: 0.7 mg/dL (ref 0.3–1.2)
Total Protein: 7.3 g/dL (ref 6.5–8.1)

## 2023-07-22 LAB — ACETAMINOPHEN LEVEL: Acetaminophen (Tylenol), Serum: <10 ug/mL — ABNORMAL LOW (ref 10–30)

## 2023-07-22 NOTE — ED Notes (Signed)
Pt changed into street clothing, requesting bus pass

## 2023-07-22 NOTE — ED Notes (Signed)
TTS interviewing pt.

## 2023-07-22 NOTE — ED Provider Notes (Signed)
Emergency department handoff note  Care of this patient was signed out to me at the end of the previous provider shift.  All pertinent patient information was conveyed and all questions were answered.  Patient pending evaluation by psychiatry who feels the patient does not meet inpatient criteria at this time and is stable for discharge with outpatient follow-up.  Patient agrees with plan and has no questions prior to discharge.   Merwyn Katos, MD 07/22/23 [phone]

## 2023-07-22 NOTE — ED Notes (Addendum)
Pt left prior to receiving discharge orders or follow up from provider

## 2023-07-22 NOTE — ED Notes (Signed)
TTS informed staff that pt will be able to dc by self.  Clothing returned to pt, waiting on AVS from oncall psych MD

## 2023-07-22 NOTE — ED Notes (Signed)
Pt demanding clothing, requesting to leave.  Staff contacted on call TTS, pt to be seen

## 2023-07-22 NOTE — ED Provider Notes (Signed)
[facility] Provider Note    Event Date/Time   First MD Initiated Contact with Patient 07/22/23 [phone]     (approximate)   History   Suicidal   HPI Alex [name] is a 47 y.o. male with history of depression and polysubstance abuse presenting today for suicidal ideation.  Patient states he has had increasing hopelessness over the past several weeks.  He states that he feels he does not want to be here anymore.  No prior history of suicide attempts.  No plan today.  No homicidal ideation.  No other self-injurious behavior.  Denies hallucinations.  Reports he has missed doses of his home depression medications recently.     Physical Exam   Triage Vital Signs: ED Triage Vitals  Encounter Vitals Group     BP 07/22/23 0253 108/79     Systolic BP Percentile --      Diastolic BP Percentile --      Pulse Rate 07/22/23 0253 100     Resp 07/22/23 0253 20     Temp 07/22/23 0253 97.8 F (36.6 C)     Temp src --      SpO2 07/22/23 0253 91 %     Weight --      Height --      Head Circumference --      Peak Flow --      Pain Score 07/22/23 0253 7     Pain Loc --      Pain Education --      Exclude from Growth Chart --     Most recent vital signs: Vitals:   07/22/23 0253  BP: 108/79  Pulse: 100  Resp: 20  Temp: 97.8 F (36.6 C)  SpO2: 91%   I have reviewed the vital signs. General:  Awake, alert, no acute distress. Head:  Normocephalic, Atraumatic. EENT:  PERRL, EOMI, Oral mucosa pink and moist, Neck is supple. Cardiovascular: Regular rate, 2+ distal pulses. Respiratory:  Normal respiratory effort, symmetrical expansion, no distress.   Extremities:  Moving all four extremities through full ROM without pain.   Neuro:  Alert and oriented.  Interacting appropriately.   Skin:  Warm, dry, no rash.   Psych: Appropriate affect.    ED Results / Procedures / Treatments   Labs (all labs ordered are listed, but only abnormal results are displayed) Labs  Reviewed  COMPREHENSIVE METABOLIC PANEL - Abnormal; Notable for the following components:      Result Value   Potassium 3.3 (*)    Glucose, Bld 131 (*)    All other components within normal limits  ETHANOL - Abnormal; Notable for the following components:   Alcohol, Ethyl (B) 90 (*)    All other components within normal limits  SALICYLATE LEVEL - Abnormal; Notable for the following components:   Salicylate Lvl <7.0 (*)    All other components within normal limits  ACETAMINOPHEN LEVEL - Abnormal; Notable for the following components:   Acetaminophen (Tylenol), Serum <10 (*)    All other components within normal limits  URINE DRUG SCREEN, QUALITATIVE ([facility] ONLY) - Abnormal; Notable for the following components:   Cocaine Metabolite,Ur [place] POSITIVE (*)    Cannabinoid 50 Ng, Ur [place] POSITIVE (*)    All other components within normal limits  CBC     EKG    RADIOLOGY    PROCEDURES:  Critical Care performed: No  Procedures   MEDICATIONS ORDERED IN ED: Medications - No data to display  IMPRESSION / MDM / ASSESSMENT AND PLAN / ED COURSE  I reviewed the triage vital signs and the nursing notes.                              Differential diagnosis includes, but is not limited to, suicidal ideation, worsening depression  Patient's presentation is most consistent with acute presentation with potential threat to life or bodily function.  The patient has been placed in psychiatric observation due to the need to provide a safe environment for the patient while obtaining psychiatric consultation and evaluation, as well as ongoing medical and medication management to treat the patient's condition.  The patient has not been placed under full IVC at this time.  Patient is a 47 year old male presenting today for suicidal ideation.  Vital signs and physical exam unremarkable.  Laboratory workup reassuring.  Patient was medically cleared for psychiatric evaluation for suicidal ideation.   Signed out to oncoming provider pending psychiatric disposition.     FINAL CLINICAL IMPRESSION(S) / ED DIAGNOSES   Final diagnoses:  Suicidal ideation     Rx / DC Orders   ED Discharge Orders     None        Note:  This document was prepared using Dragon voice recognition software and may include unintentional dictation errors.   Janith Lima, MD 07/22/23 [phone]

## 2023-07-22 NOTE — ED Notes (Signed)
Pt dressed in blue paper scrubs, non slip socks.   Pt has tennis shoes, purple sweat pants, white polo shirt, no wallet. 1 pack of [place] reds and 1 lighter inside, 1 pair of socks (green), 1 black pair of basketball shorts

## 2023-07-22 NOTE — ED Notes (Signed)
Pt given breakfast tray

## 2023-07-22 NOTE — ED Triage Notes (Signed)
Pt comes in stating " I can't deal with it anymore"   Pt reports not eating x 2 days, having "a lot going on"  and pt reports he is having thoughts of harming himself.  Is having continued shoulder pain following surgical repair of dislocation earlier in the month.  Pt states continued pain contributes to his feelings of hopelessness.

## 2023-07-25 ENCOUNTER — Other Ambulatory Visit (HOSPITAL_COMMUNITY): Payer: Self-pay

## 2023-08-20 ENCOUNTER — Emergency Department: Payer: MEDICAID

## 2023-08-20 ENCOUNTER — Emergency Department
Admission: EM | Admit: 2023-08-20 | Discharge: 2023-08-20 | Disposition: A | Discharge status: Home / Self Care | Payer: MEDICAID | Attending: Emergency Medicine | Admitting: Emergency Medicine

## 2023-08-20 ENCOUNTER — Other Ambulatory Visit: Payer: Self-pay

## 2023-08-20 DIAGNOSIS — Y906 Blood alcohol level of 120-199 mg/100 ml: Secondary | ICD-10-CM | POA: Insufficient documentation

## 2023-08-20 DIAGNOSIS — F1010 Alcohol abuse, uncomplicated: Secondary | ICD-10-CM | POA: Diagnosis present

## 2023-08-20 DIAGNOSIS — R0981 Nasal congestion: Secondary | ICD-10-CM | POA: Diagnosis not present

## 2023-08-20 DIAGNOSIS — M791 Myalgia, unspecified site: Secondary | ICD-10-CM | POA: Diagnosis not present

## 2023-08-20 DIAGNOSIS — Z20822 Contact with and (suspected) exposure to covid-19: Secondary | ICD-10-CM | POA: Insufficient documentation

## 2023-08-20 DIAGNOSIS — R110 Nausea: Secondary | ICD-10-CM | POA: Diagnosis not present

## 2023-08-20 DIAGNOSIS — R197 Diarrhea, unspecified: Secondary | ICD-10-CM | POA: Diagnosis not present

## 2023-08-20 DIAGNOSIS — R6889 Other general symptoms and signs: Secondary | ICD-10-CM

## 2023-08-20 DIAGNOSIS — R059 Cough, unspecified: Secondary | ICD-10-CM | POA: Insufficient documentation

## 2023-08-20 LAB — COMPREHENSIVE METABOLIC PANEL
ALT: 12 U/L (ref 0–44)
AST: 21 U/L (ref 15–41)
Albumin: 4.1 g/dL (ref 3.5–5.0)
Alkaline Phosphatase: 89 U/L (ref 38–126)
Anion gap: 13 (ref 5–15)
BUN: 9 mg/dL (ref 6–20)
CO2: 20 mmol/L — ABNORMAL LOW (ref 22–32)
Calcium: 8.9 mg/dL (ref 8.9–10.3)
Chloride: 97 mmol/L — ABNORMAL LOW (ref 98–111)
Creatinine, Ser: 0.8 mg/dL (ref 0.61–1.24)
GFR, Estimated: >60 mL/min (ref >60)
Glucose, Bld: 82 mg/dL (ref 70–99)
Potassium: 3.8 mmol/L (ref 3.5–5.1)
Sodium: 130 mmol/L — ABNORMAL LOW (ref 135–145)
Total Bilirubin: 0.7 mg/dL (ref <1.2)
Total Protein: 8 g/dL (ref 6.5–8.1)

## 2023-08-20 LAB — ETHANOL: Alcohol, Ethyl (B): 178 mg/dL — ABNORMAL HIGH (ref <10)

## 2023-08-20 LAB — CBC
HCT: 41.4 % (ref 39.0–52.0)
Hemoglobin: 13.6 g/dL (ref 13.0–17.0)
MCH: 28.2 pg (ref 26.0–34.0)
MCHC: 32.9 g/dL (ref 30.0–36.0)
MCV: 85.9 fL (ref 80.0–100.0)
Platelets: 223 10*3/uL (ref 150–400)
RBC: 4.82 MIL/uL (ref 4.22–5.81)
RDW: 16.7 % — ABNORMAL HIGH (ref 11.5–15.5)
WBC: 8.6 10*3/uL (ref 4.0–10.5)
nRBC: 0 % (ref 0.0–0.2)

## 2023-08-20 LAB — URINALYSIS, ROUTINE W REFLEX MICROSCOPIC
Bilirubin Urine: NEGATIVE
Glucose, UA: NEGATIVE mg/dL
Hgb urine dipstick: NEGATIVE
Ketones, ur: NEGATIVE mg/dL
Leukocytes,Ua: NEGATIVE
Nitrite: NEGATIVE
Protein, ur: NEGATIVE mg/dL
Specific Gravity, Urine: 1.003 — ABNORMAL LOW (ref 1.005–1.030)
pH: 6 (ref 5.0–8.0)

## 2023-08-20 LAB — RESP PANEL BY RT-PCR (RSV, FLU A&B, COVID)  RVPGX2
Influenza A by PCR: NEGATIVE
Influenza B by PCR: NEGATIVE
Resp Syncytial Virus by PCR: NEGATIVE
SARS Coronavirus 2 by RT PCR: NEGATIVE

## 2023-08-20 LAB — LIPASE, BLOOD: Lipase: 46 U/L (ref 11–51)

## 2023-08-20 LAB — TROPONIN I (HIGH SENSITIVITY): Troponin I (High Sensitivity): 3 ng/L (ref <18)

## 2023-08-20 MED ORDER — IBUPROFEN 800 MG PO TABS
800.0000 mg | ORAL_TABLET | Freq: Once | ORAL | Status: AC
  Administered 2023-08-20: 800 mg via ORAL
  Filled 2023-08-20: qty 1

## 2023-08-20 MED ORDER — ONDANSETRON 4 MG PO TBDP: 4.0000 mg | ORAL_TABLET | Freq: Four times a day (QID) | ORAL | 0 refills | Status: AC | PRN

## 2023-08-20 MED ORDER — ONDANSETRON 4 MG PO TBDP
4.0000 mg | ORAL_TABLET | Freq: Once | ORAL | Status: AC
Start: 2023-08-20 — End: 2023-08-20
  Administered 2023-08-20: 4 mg via ORAL
  Filled 2023-08-20: qty 1

## 2023-08-20 NOTE — ED Provider Notes (Signed)
[facility] Provider Note    Event Date/Time   First MD Initiated Contact with Patient 08/20/23 [phone]     (approximate)   History   pain  (neck)   HPI  Alex [name] is a 47 y.o. male with history of polysubstance abuse, seizures, depression and anxiety who presents to the emergency department with cough, congestion, nausea, diarrhea, diffuse bodyaches.  No known fevers.  No vomiting.  No dysuria or hematuria.   History provided by patient.    Past Medical History:  Diagnosis Date   Alcohol abuse    Anxiety    Cocaine abuse (HCC) 05/27/2016   Depression    Seizures (HCC)     Past Surgical History:  Procedure Laterality Date   FINGER SURGERY     SHOULDER CLOSED REDUCTION Left 03/14/2023   Procedure: Radiological Exam in OR;  Surgeon: Reinaldo Berber, MD;  Location: [facility] ORS;  Service: Orthopedics;  Laterality: Left;   SHOULDER CLOSED REDUCTION Left 06/30/2023   Procedure: CLOSED REDUCTION SHOULDER;  Surgeon: Signa Kell, MD;  Location: [facility] ORS;  Service: Orthopedics;  Laterality: Left;    MEDICATIONS:  Prior to Admission medications   Medication Sig Start Date End Date Taking? Authorizing Provider  acetaminophen (TYLENOL) 500 MG tablet Take 2 tablets (1,000 mg total) by mouth every 8 (eight) hours. 06/30/23 06/29/24  Signa Kell, MD  citalopram (CELEXA) 40 MG tablet Take 40 mg by mouth daily.    [provider]  lithium carbonate 300 MG capsule Take 300 mg by mouth 2 (two) times daily with a meal.    [provider]  meloxicam (MOBIC) 15 MG tablet Take 1 tablet (15 mg total) by mouth daily. 06/30/23 06/29/24  Signa Kell, MD  omeprazole (PRILOSEC) 20 MG capsule Take 20 mg by mouth 2 (two) times daily before a meal.    [provider]  promethazine (PHENERGAN) 25 MG tablet Take 25 mg by mouth at bedtime.    [provider]  propranolol (INDERAL) 20 MG tablet Take 20 mg by mouth 2 (two) times daily.    [provider]  traZODone (DESYREL) 150 MG tablet Take 150 mg by mouth at bedtime.    [provider]  triamterene-hydrochlorothiazide (DYAZIDE) 37.5-25 MG capsule Take 1 capsule by mouth daily.    [provider]  valproic acid (DEPAKENE) 250 MG capsule Take 3 capsules (750 mg total) by mouth at bedtime. 06/23/23 09/21/23  Pilar Jarvis, MD    Physical Exam   Triage Vital Signs: ED Triage Vitals  Encounter Vitals Group     BP 08/20/23 0239 (!) 115/96     Systolic BP Percentile --      Diastolic BP Percentile --      Pulse Rate 08/20/23 0239 71     Resp 08/20/23 0239 18     Temp 08/20/23 0239 98.2 F (36.8 C)     Temp Source 08/20/23 0239 Oral     SpO2 08/20/23 0239 95 %     Weight --      Height 08/20/23 0239 6' (1.829 m)     Head Circumference --      Peak Flow --      Pain Score 08/20/23 0242 10     Pain Loc --      Pain Education --      Exclude from Growth Chart --     Most recent vital signs: Vitals:   08/20/23 0239  BP: (!) 115/96  Pulse: 71  Resp: 18  Temp: 98.2 F (36.8 C)  SpO2: 95%    CONSTITUTIONAL: Alert, responds appropriately to questions. Well-appearing; well-nourished HEAD: Normocephalic, atraumatic EYES: Conjunctivae clear, pupils appear equal, sclera nonicteric ENT: normal nose; moist mucous membranes NECK: Supple, normal ROM, no midline spinal tenderness or step-off or deformity CARD: RRR; S1 and S2 appreciated RESP: Normal chest excursion without splinting or tachypnea; breath sounds clear and equal bilaterally; no wheezes, no rhonchi, no rales, no hypoxia or respiratory distress, speaking full sentences ABD/GI: Non-distended; soft, non-tender, no rebound, no guarding, no peritoneal signs BACK: The back appears normal, no midline spinal tenderness or step-off or deformity EXT: Normal ROM in all joints; no deformity noted, no edema, no calf tenderness or calf swelling SKIN: Normal color for age and race; warm; no rash on exposed  skin NEURO: Moves all extremities equally, normal speech, no facial asymmetry PSYCH: The patient's mood and manner are appropriate.   ED Results / Procedures / Treatments   LABS: (all labs ordered are listed, but only abnormal results are displayed) Labs Reviewed  COMPREHENSIVE METABOLIC PANEL - Abnormal; Notable for the following components:      Result Value   Sodium 130 (*)    Chloride 97 (*)    CO2 20 (*)    All other components within normal limits  CBC - Abnormal; Notable for the following components:   RDW 16.7 (*)    All other components within normal limits  URINALYSIS, ROUTINE W REFLEX MICROSCOPIC - Abnormal; Notable for the following components:   Color, Urine STRAW (*)    APPearance CLEAR (*)    Specific Gravity, Urine 1.003 (*)    All other components within normal limits  ETHANOL - Abnormal; Notable for the following components:   Alcohol, Ethyl (B) 178 (*)    All other components within normal limits  RESP PANEL BY RT-PCR (RSV, FLU A&B, COVID)  RVPGX2  LIPASE, BLOOD  TROPONIN I (HIGH SENSITIVITY)     EKG:  EKG Interpretation Date/Time:    Ventricular Rate:    PR Interval:    QRS Duration:    QT Interval:    QTC Calculation:   R Axis:      Text Interpretation:           RADIOLOGY: My personal review and interpretation of imaging:    I have personally reviewed all radiology reports.   DG Chest Portable 1 View  Result Date: 08/20/2023 CLINICAL DATA:  Cough and congestion. EXAM: PORTABLE CHEST 1 VIEW COMPARISON:  05/03/2023 FINDINGS: Normal heart size. Scar versus atelectasis noted within the left lower lung, similar to previous imaging. No definite airspace consolidation. Visualized osseous structures appear unremarkable. No pleural fluid or interstitial edema. IMPRESSION: Left lower lung scar versus atelectasis. Electronically Signed   By: Signa Kell M.D.   On: 08/20/2023 05:26     PROCEDURES:  Critical Care performed:  No     Procedures    IMPRESSION / MDM / ASSESSMENT AND PLAN / ED COURSE  I reviewed the triage vital signs and the nursing notes.    Patient here with flulike symptoms.     DIFFERENTIAL DIAGNOSIS (includes but not limited to):   Viral illness, anemia, electrolyte derangement, intoxication   Patient's presentation is most consistent with acute complicated illness / injury requiring diagnostic workup.   PLAN: Labs obtained from triage show no leukocytosis, normal hemoglobin.  Mild hyponatremia.  Normal glucose.  LFTs and lipase unremarkable.  Ethanol level 178.  Troponin negative.  No indication for serial enzymes.  Urine shows no sign of infection or dehydration.  Will obtain chest x-ray, COVID and flu swab.  Will give ibuprofen and Zofran for symptomatic relief.  Will p.o. challenge.   MEDICATIONS GIVEN IN ED: Medications  ibuprofen (ADVIL) tablet 800 mg (800 mg Oral Given 08/20/23 0435)  ondansetron (ZOFRAN-ODT) disintegrating tablet 4 mg (4 mg Oral Given 08/20/23 0435)     ED COURSE: COVID, flu negative.  Chest x-ray reviewed and interpreted by myself and radiologist and is clear.  I feel he is safe for discharge.  No indication that patient needs antibiotics today.   At this time, I do not feel there is any life-threatening condition present. I reviewed all nursing notes, vitals, pertinent previous records.  All lab and urine results, EKGs, imaging ordered have been independently reviewed and interpreted by myself.  I reviewed all available radiology reports from any imaging ordered this visit.  Based on my assessment, I feel the patient is safe to be discharged home without further emergent workup and can continue workup as an outpatient as needed. Discussed all findings, treatment plan as well as usual and customary return precautions.  They verbalize understanding and are comfortable with this plan.  Outpatient follow-up has been provided as needed.  All questions have  been answered.    CONSULTS:  none   OUTSIDE RECORDS REVIEWED: Reviewed last family medicine note in January 2024.       FINAL CLINICAL IMPRESSION(S) / ED DIAGNOSES   Final diagnoses:  Flu-like symptoms  Alcohol abuse     Rx / DC Orders   ED Discharge Orders          Ordered    ondansetron (ZOFRAN-ODT) 4 MG disintegrating tablet  Every 6 hours PRN        08/20/23 0530             Note:  This document was prepared using Dragon voice recognition software and may include unintentional dictation errors.   Lillyen Schow, Layla Maw, DO 08/20/23 0530

## 2023-08-20 NOTE — ED Notes (Signed)
Pt given meal tray and drink and sent to lobby with bus pass.

## 2023-08-20 NOTE — ED Triage Notes (Signed)
Pt to ed from [facility] via [facility] for neck pain and left leg pain. Pt has been drinking tonight. Pt smells of ETOH. Pt is CAOx4, in no acute distress and in wheel chair by ems. EMS advised he was ambulatory on scene.

## 2023-08-20 NOTE — ED Notes (Signed)
Tech attempted labs. Unsuccessful. Lab called.

## 2023-08-20 NOTE — Discharge Instructions (Addendum)
You may alternate Tylenol 1000 mg every 6 hours as needed for pain, fever and Ibuprofen 800 mg every 6-8 hours as needed for pain, fever.  Please take Ibuprofen with food.  Do not take more than 4000 mg of Tylenol (acetaminophen) in a 24 hour period.  You may use over-the-counter Imodium as needed for diarrhea.  Your labs, urine, chest x-ray were reassuring today.  Your COVID and flu swabs were negative.  I suspect you have a viral illness causing your symptoms.  You do not need antibiotics.

## 2023-08-31 ENCOUNTER — Emergency Department: Payer: MEDICAID

## 2023-08-31 ENCOUNTER — Other Ambulatory Visit: Payer: Self-pay

## 2023-08-31 ENCOUNTER — Encounter: Payer: Self-pay | Admitting: Emergency Medicine

## 2023-08-31 ENCOUNTER — Emergency Department: Payer: MEDICAID | Admitting: Certified Registered Nurse Anesthetist

## 2023-08-31 ENCOUNTER — Observation Stay
Admission: EM | Admit: 2023-08-31 | Discharge: 2023-09-01 | Discharge status: Against Medical Advice | Payer: MEDICAID | Attending: Internal Medicine | Admitting: Internal Medicine

## 2023-08-31 ENCOUNTER — Encounter
Admission: EM | Discharge status: Against Medical Advice | Payer: Self-pay | Source: Home / Self Care | Attending: Emergency Medicine

## 2023-08-31 DIAGNOSIS — G40909 Epilepsy, unspecified, not intractable, without status epilepticus: Secondary | ICD-10-CM | POA: Insufficient documentation

## 2023-08-31 DIAGNOSIS — Z5900 Homelessness unspecified: Secondary | ICD-10-CM

## 2023-08-31 DIAGNOSIS — S43015A Anterior dislocation of left humerus, initial encounter: Principal | ICD-10-CM | POA: Diagnosis present

## 2023-08-31 DIAGNOSIS — S43005A Unspecified dislocation of left shoulder joint, initial encounter: Secondary | ICD-10-CM | POA: Diagnosis present

## 2023-08-31 DIAGNOSIS — F1910 Other psychoactive substance abuse, uncomplicated: Secondary | ICD-10-CM | POA: Diagnosis not present

## 2023-08-31 DIAGNOSIS — F10930 Alcohol use, unspecified with withdrawal, uncomplicated: Secondary | ICD-10-CM | POA: Diagnosis not present

## 2023-08-31 DIAGNOSIS — Z91010 Allergy to peanuts: Secondary | ICD-10-CM | POA: Insufficient documentation

## 2023-08-31 DIAGNOSIS — Z79899 Other long term (current) drug therapy: Secondary | ICD-10-CM | POA: Insufficient documentation

## 2023-08-31 DIAGNOSIS — E669 Obesity, unspecified: Secondary | ICD-10-CM | POA: Diagnosis present

## 2023-08-31 DIAGNOSIS — F17210 Nicotine dependence, cigarettes, uncomplicated: Secondary | ICD-10-CM | POA: Insufficient documentation

## 2023-08-31 DIAGNOSIS — X501XXA Overexertion from prolonged static or awkward postures, initial encounter: Secondary | ICD-10-CM | POA: Diagnosis not present

## 2023-08-31 DIAGNOSIS — I10 Essential (primary) hypertension: Secondary | ICD-10-CM | POA: Insufficient documentation

## 2023-08-31 DIAGNOSIS — R569 Unspecified convulsions: Secondary | ICD-10-CM

## 2023-08-31 DIAGNOSIS — S4292XA Fracture of left shoulder girdle, part unspecified, initial encounter for closed fracture: Secondary | ICD-10-CM | POA: Diagnosis not present

## 2023-08-31 DIAGNOSIS — Z6832 Body mass index (BMI) 32.0-32.9, adult: Secondary | ICD-10-CM | POA: Diagnosis not present

## 2023-08-31 DIAGNOSIS — F1420 Cocaine dependence, uncomplicated: Secondary | ICD-10-CM | POA: Diagnosis not present

## 2023-08-31 HISTORY — PX: SHOULDER CLOSED REDUCTION: SHX1051

## 2023-08-31 LAB — BASIC METABOLIC PANEL
Anion gap: 8 (ref 5–15)
BUN: 16 mg/dL (ref 6–20)
CO2: 25 mmol/L (ref 22–32)
Calcium: 9 mg/dL (ref 8.9–10.3)
Chloride: 102 mmol/L (ref 98–111)
Creatinine, Ser: 1.1 mg/dL (ref 0.61–1.24)
GFR, Estimated: >60 mL/min (ref >60)
Glucose, Bld: 169 mg/dL — ABNORMAL HIGH (ref 70–99)
Potassium: 4.1 mmol/L (ref 3.5–5.1)
Sodium: 135 mmol/L (ref 135–145)

## 2023-08-31 LAB — CBC
HCT: 39.3 % (ref 39.0–52.0)
Hemoglobin: 12.6 g/dL — ABNORMAL LOW (ref 13.0–17.0)
MCH: 28.6 pg (ref 26.0–34.0)
MCHC: 32.1 g/dL (ref 30.0–36.0)
MCV: 89.3 fL (ref 80.0–100.0)
Platelets: 188 10*3/uL (ref 150–400)
RBC: 4.4 MIL/uL (ref 4.22–5.81)
RDW: 17.9 % — ABNORMAL HIGH (ref 11.5–15.5)
WBC: 6.5 10*3/uL (ref 4.0–10.5)
nRBC: 0 % (ref 0.0–0.2)

## 2023-08-31 LAB — ETHANOL: Alcohol, Ethyl (B): <10 mg/dL (ref <10)

## 2023-08-31 SURGERY — CLOSED REDUCTION, SHOULDER
Anesthesia: General | Site: Shoulder | Laterality: Left

## 2023-08-31 MED ORDER — SUCCINYLCHOLINE CHLORIDE 200 MG/10ML IV SOSY
PREFILLED_SYRINGE | INTRAVENOUS | Status: DC | PRN
  Administered 2023-08-31: 100 mg via INTRAVENOUS

## 2023-08-31 MED ORDER — OXYCODONE-ACETAMINOPHEN 5-325 MG PO TABS
1.0000 | ORAL_TABLET | Freq: Three times a day (TID) | ORAL | Status: DC | PRN
  Administered 2023-08-31 – 2023-09-01 (×2): 1 via ORAL
  Filled 2023-08-31 (×2): qty 1

## 2023-08-31 MED ORDER — PROPRANOLOL HCL 20 MG PO TABS
20.0000 mg | ORAL_TABLET | Freq: Two times a day (BID) | ORAL | Status: DC
  Administered 2023-08-31 – 2023-09-01 (×2): 20 mg via ORAL
  Filled 2023-08-31 (×2): qty 1

## 2023-08-31 MED ORDER — MIDAZOLAM HCL 2 MG/2ML IJ SOLN
INTRAMUSCULAR | Status: AC
  Filled 2023-08-31: qty 2

## 2023-08-31 MED ORDER — LIDOCAINE HCL (CARDIAC) PF 100 MG/5ML IV SOSY
PREFILLED_SYRINGE | INTRAVENOUS | Status: DC | PRN
  Administered 2023-08-31: 100 mg via INTRATRACHEAL

## 2023-08-31 MED ORDER — LACTATED RINGERS IV SOLN
INTRAVENOUS | Status: DC | PRN
Start: 2023-08-31 — End: 2023-08-31

## 2023-08-31 MED ORDER — PROPOFOL 10 MG/ML IV BOLUS
INTRAVENOUS | Status: DC | PRN
  Administered 2023-08-31: 100 mg via INTRAVENOUS

## 2023-08-31 MED ORDER — ENOXAPARIN SODIUM 40 MG/0.4ML IJ SOSY
40.0000 mg | PREFILLED_SYRINGE | INTRAMUSCULAR | Status: DC
  Administered 2023-09-01: 40 mg via SUBCUTANEOUS
  Filled 2023-08-31: qty 0.4

## 2023-08-31 MED ORDER — MIDAZOLAM HCL 2 MG/2ML IJ SOLN
2.0000 mg | Freq: Once | INTRAMUSCULAR | Status: AC
  Administered 2023-08-31: 2 mg via INTRAVENOUS

## 2023-08-31 MED ORDER — HYDROMORPHONE HCL 1 MG/ML IJ SOLN
1.0000 mg | Freq: Once | INTRAMUSCULAR | Status: AC
  Administered 2023-08-31: 1 mg via INTRAVENOUS
  Filled 2023-08-31: qty 1

## 2023-08-31 MED ORDER — ACETAMINOPHEN 325 MG PO TABS: 650.0000 mg | ORAL_TABLET | Freq: Four times a day (QID) | ORAL | Status: DC | PRN

## 2023-08-31 MED ORDER — SUCCINYLCHOLINE CHLORIDE 200 MG/10ML IV SOSY
PREFILLED_SYRINGE | INTRAVENOUS | Status: AC
  Filled 2023-08-31: qty 10

## 2023-08-31 MED ORDER — THIAMINE HCL 100 MG/ML IJ SOLN
100.0000 mg | Freq: Every day | INTRAMUSCULAR | Status: DC
Start: 2023-08-31 — End: 2023-09-01

## 2023-08-31 MED ORDER — MIDAZOLAM HCL 2 MG/2ML IJ SOLN
INTRAMUSCULAR | Status: AC | PRN
  Administered 2023-08-31: 2 mg via INTRAVENOUS

## 2023-08-31 MED ORDER — LORAZEPAM 1 MG PO TABS: 1.0000 mg | ORAL_TABLET | ORAL | Status: DC | PRN

## 2023-08-31 MED ORDER — PROPOFOL 10 MG/ML IV BOLUS
100.0000 mg | Freq: Once | INTRAVENOUS | Status: AC
  Filled 2023-08-31: qty 20

## 2023-08-31 MED ORDER — SODIUM CHLORIDE 0.9 % IV BOLUS
1000.0000 mL | Freq: Once | INTRAVENOUS | Status: AC
  Administered 2023-08-31: 1000 mL via INTRAVENOUS

## 2023-08-31 MED ORDER — FENTANYL CITRATE (PF) 100 MCG/2ML IJ SOLN
INTRAMUSCULAR | Status: AC
  Filled 2023-08-31: qty 2

## 2023-08-31 MED ORDER — ONDANSETRON HCL 4 MG/2ML IJ SOLN: 4.0000 mg | Freq: Four times a day (QID) | INTRAMUSCULAR | Status: DC | PRN

## 2023-08-31 MED ORDER — MIDAZOLAM HCL 2 MG/2ML IJ SOLN
INTRAMUSCULAR | Status: DC | PRN
  Administered 2023-08-31: 2 mg via INTRAVENOUS

## 2023-08-31 MED ORDER — TRIAMTERENE-HCTZ 37.5-25 MG PO TABS
1.0000 | ORAL_TABLET | Freq: Every day | ORAL | Status: DC
Start: 2023-08-31 — End: 2023-09-01
  Administered 2023-08-31: 1 via ORAL
  Filled 2023-08-31 (×2): qty 1

## 2023-08-31 MED ORDER — LIDOCAINE HCL (PF) 1 % IJ SOLN
30.0000 mL | Freq: Once | INTRAMUSCULAR | Status: AC
  Administered 2023-08-31: 30 mL
  Filled 2023-08-31: qty 30

## 2023-08-31 MED ORDER — FOLIC ACID 1 MG PO TABS
1.0000 mg | ORAL_TABLET | Freq: Every day | ORAL | Status: DC
  Administered 2023-08-31 – 2023-09-01 (×2): 1 mg via ORAL
  Filled 2023-08-31 (×2): qty 1

## 2023-08-31 MED ORDER — ADULT MULTIVITAMIN W/MINERALS CH
1.0000 | ORAL_TABLET | Freq: Every day | ORAL | Status: DC
  Administered 2023-08-31 – 2023-09-01 (×2): 1 via ORAL
  Filled 2023-08-31 (×2): qty 1

## 2023-08-31 MED ORDER — LITHIUM CARBONATE 300 MG PO CAPS
300.0000 mg | ORAL_CAPSULE | Freq: Two times a day (BID) | ORAL | Status: DC
  Administered 2023-09-01: 300 mg via ORAL
  Filled 2023-08-31 (×2): qty 1

## 2023-08-31 MED ORDER — FENTANYL CITRATE (PF) 100 MCG/2ML IJ SOLN: 25.0000 ug | INTRAMUSCULAR | Status: DC | PRN

## 2023-08-31 MED ORDER — TRAZODONE HCL 50 MG PO TABS
150.0000 mg | ORAL_TABLET | Freq: Every day | ORAL | Status: DC
  Administered 2023-08-31: 150 mg via ORAL
  Filled 2023-08-31: qty 1

## 2023-08-31 MED ORDER — PROPOFOL 10 MG/ML IV BOLUS
INTRAVENOUS | Status: AC
  Administered 2023-08-31: 100 mg via INTRAVENOUS
  Filled 2023-08-31: qty 20

## 2023-08-31 MED ORDER — THIAMINE MONONITRATE 100 MG PO TABS
100.0000 mg | ORAL_TABLET | Freq: Every day | ORAL | Status: DC
  Administered 2023-08-31 – 2023-09-01 (×2): 100 mg via ORAL
  Filled 2023-08-31 (×2): qty 1

## 2023-08-31 MED ORDER — VALPROIC ACID 250 MG PO CAPS
750.0000 mg | ORAL_CAPSULE | Freq: Every day | ORAL | Status: DC
  Administered 2023-08-31: 750 mg via ORAL
  Filled 2023-08-31: qty 3

## 2023-08-31 MED ORDER — PROPOFOL 1000 MG/100ML IV EMUL
INTRAVENOUS | Status: AC
  Filled 2023-08-31: qty 100

## 2023-08-31 MED ORDER — MIDAZOLAM HCL (PF) 10 MG/2ML IJ SOLN
INTRAMUSCULAR | Status: AC
  Filled 2023-08-31: qty 2

## 2023-08-31 MED ORDER — SENNOSIDES-DOCUSATE SODIUM 8.6-50 MG PO TABS: 1.0000 | ORAL_TABLET | Freq: Every evening | ORAL | Status: DC | PRN

## 2023-08-31 MED ORDER — FENTANYL CITRATE (PF) 100 MCG/2ML IJ SOLN
INTRAMUSCULAR | Status: DC | PRN
  Administered 2023-08-31: 50 ug via INTRAVENOUS

## 2023-08-31 MED ORDER — ONDANSETRON HCL 4 MG PO TABS: 4.0000 mg | ORAL_TABLET | Freq: Four times a day (QID) | ORAL | Status: DC | PRN

## 2023-08-31 MED ORDER — LORAZEPAM 2 MG/ML IJ SOLN: 1.0000 mg | INTRAMUSCULAR | Status: DC | PRN

## 2023-08-31 MED ORDER — HYDROMORPHONE HCL 1 MG/ML IJ SOLN: 0.5000 mg | Freq: Four times a day (QID) | INTRAMUSCULAR | Status: DC | PRN

## 2023-08-31 MED ORDER — ONDANSETRON HCL 4 MG/2ML IJ SOLN
INTRAMUSCULAR | Status: DC | PRN
  Administered 2023-08-31: 4 mg via INTRAVENOUS

## 2023-08-31 MED ORDER — ACETAMINOPHEN 650 MG RE SUPP: 650.0000 mg | Freq: Four times a day (QID) | RECTAL | Status: DC | PRN

## 2023-08-31 SURGICAL SUPPLY — 3 items
IMMOBILIZER SHDR MD LX WHT (SOFTGOODS) ×1 IMPLANT
KIT TURNOVER KIT A (KITS) ×1 IMPLANT
SLING ARM M TX990204 (SOFTGOODS) IMPLANT

## 2023-08-31 NOTE — H&P (Signed)
ORTHOPAEDIC CONSULTATION  REQUESTING PHYSICIAN: Janith Lima, MD  Chief Complaint: Left shoulder pain  HPI: Alex [name] is a 47 y.o. male who presents with left anterior shoulder dislocation after a history of multiple dislocations and closed reduction was performed by both Dr. Allena Katz and Dr. Audelia Acton.  Presents today with an additional dislocation.  Closed reduction was attempted in the emergency room unsuccessful.  Patient denies any neurovascular changes or any numbness in the arm or shoulder.  2+ radial pulse.  He is currently homeless with a history of polysubstance abuse  Past Medical History:  Diagnosis Date   Alcohol abuse    Anxiety    Cocaine abuse (HCC) 05/27/2016   Depression    Seizures (HCC)    Past Surgical History:  Procedure Laterality Date   FINGER SURGERY     SHOULDER CLOSED REDUCTION Left 03/14/2023   Procedure: Radiological Exam in OR;  Surgeon: Reinaldo Berber, MD;  Location: [facility] ORS;  Service: Orthopedics;  Laterality: Left;   SHOULDER CLOSED REDUCTION Left 06/30/2023   Procedure: CLOSED REDUCTION SHOULDER;  Surgeon: Signa Kell, MD;  Location: [facility] ORS;  Service: Orthopedics;  Laterality: Left;   Social History   Socioeconomic History   Marital status: Single    Spouse name: Not on file   Number of children: Not on file   Years of education: Not on file   Highest education level: Not on file  Occupational History   Not on file  Tobacco Use   Smoking status: Every Day    Current packs/day: 1.00    Types: Cigarettes, E-cigarettes   Smokeless tobacco: Never  Substance and Sexual Activity   Alcohol use: Yes    Alcohol/week: 4.0 - 5.0 standard drinks of alcohol    Types: 4 - 5 Cans of beer per week   Drug use: Yes    Types: Marijuana, Cocaine   Sexual activity: Not Currently  Other Topics Concern   Not on file  Social History Narrative   Not on file   Social Determinants of Health   Financial Resource Strain: Not on file  Food  Insecurity: Not on file  Transportation Needs: Not on file  Physical Activity: Not on file  Stress: Not on file  Social Connections: Not on file   History reviewed. No pertinent family history. - negative except otherwise stated in the family history section Allergies  Allergen Reactions   Other Itching    Peanut Butter   Peanut Butter Flavor     Other Reaction(s): Unknown   Prior to Admission medications   Medication Sig Start Date End Date Taking? Authorizing Provider  propranolol (INDERAL) 20 MG tablet Take 20 mg by mouth 2 (two) times daily.   Yes [provider]  acetaminophen (TYLENOL) 500 MG tablet Take 2 tablets (1,000 mg total) by mouth every 8 (eight) hours. 06/30/23 06/29/24  Signa Kell, MD  citalopram (CELEXA) 40 MG tablet Take 40 mg by mouth daily.    [provider]  lithium carbonate 300 MG capsule Take 300 mg by mouth 2 (two) times daily with a meal.    [provider]  meloxicam (MOBIC) 15 MG tablet Take 1 tablet (15 mg total) by mouth daily. 06/30/23 06/29/24  Signa Kell, MD  omeprazole (PRILOSEC) 20 MG capsule Take 20 mg by mouth 2 (two) times daily before a meal.    [provider]  ondansetron (ZOFRAN-ODT) 4 MG disintegrating tablet Take 1 tablet (4 mg total) by mouth every 6 (  six) hours as needed for nausea or vomiting. 08/20/23   Ward, Layla Maw, DO  promethazine (PHENERGAN) 25 MG tablet Take 25 mg by mouth at bedtime.    [provider]  traZODone (DESYREL) 150 MG tablet Take 150 mg by mouth at bedtime.    [provider]  triamterene-hydrochlorothiazide (DYAZIDE) 37.5-25 MG capsule Take 1 capsule by mouth daily.    [provider]  valproic acid (DEPAKENE) 250 MG capsule Take 3 capsules (750 mg total) by mouth at bedtime. 06/23/23 09/21/23  Pilar Jarvis, MD   DG Shoulder Left  Result Date: 08/31/2023 CLINICAL DATA:  Left shoulder dislocation EXAM: LEFT SHOULDER - 2 VIEW COMPARISON:  Same day left  shoulder radiograph FINDINGS: Persistent anterior dislocation of the left shoulder. Hill-Sachs deformity of the lateral humeral head. IMPRESSION: Persistent anterior dislocation of the left shoulder. Electronically Signed   By: Agustin Cree M.D.   On: 08/31/2023 16:00   DG Shoulder Left  Result Date: 08/31/2023 CLINICAL DATA:  Left shoulder dislocation. EXAM: LEFT SHOULDER - 2+ VIEW COMPARISON:  June 30, 2023. FINDINGS: Anterior dislocation of left glenohumeral joint is noted. No definite fracture is noted. Defect is seen involving greater tuberosity of proximal left humerus consistent with Hill-Sachs deformity secondary to multiple dislocations. IMPRESSION: Anterior dislocation of left glenohumeral joint is noted Electronically Signed   By: Lupita Raider M.D.   On: 08/31/2023 08:53     Positive ROS: All other systems have been reviewed and were otherwise negative with the exception of those mentioned in the HPI and as above.  Physical Exam: General: No acute distress Cardiovascular: No pedal edema Respiratory: No cyanosis, no use of accessory musculature GI: No organomegaly, abdomen is soft and non-tender Skin: No lesions in the area of chief complaint Neurologic: Sensation intact distally Psychiatric: Patient is at baseline mood and affect Lymphatic: No axillary or cervical lymphadenopathy  MUSCULOSKELETAL:  Left shoulder mild in a bent immobilized state.  Fires EPL as well as wrist extensors and flexors of the digits.  Sensation is intact in all distributions.  2+ radial pulse  Independent Imaging Review: 3 views left shoulder: Anterior shoulder dislocation  Assessment: 47 year old homeless male with a left anterior shoulder dislocation after sleeping the previous night.  He has no evidence of neurovascular compromise.  Plan to proceed to the emergency room for closed reduction of the left shoulder.  He will likely need admission overnight for placement.  Plan: Plan for closed  reduction of the left shoulder in the operating room   After a lengthy discussion of treatment options, including risks, benefits, alternatives, complications of surgical and nonsurgical conservative options, the patient elected surgical repair.   The patient  is aware of the material risks  and complications including, but not limited to injury to adjacent structures, neurovascular injury, infection, numbness, bleeding, implant failure, thermal burns, stiffness, persistent pain, failure to heal, disease transmission from allograft, need for further surgery, dislocation, anesthetic risks, blood clots, risks of death,and others. The probabilities of surgical success and failure discussed with patient given their particular co-morbidities.The time and nature of expected rehabilitation and recovery was discussed.The patient's questions were all answered preoperatively.  No barriers to understanding were noted. I explained the natural history of the disease process and Rx rationale.  I explained to the patient what I considered to be reasonable expectations given their personal situation.  The final treatment plan was arrived at through a shared patient decision making process model.   Thank you for  the consult and the opportunity to see Mr. Bodee Por, MD [facility] 6:07 PM

## 2023-08-31 NOTE — H&P (Signed)
History and Physical    Patient: Alex [name] DOB: 09-Jan-1976 DOA: 08/31/2023 DOS: the patient was seen and examined on 08/31/2023 PCP: Dortha Kern, MD  Patient coming from: Home  Chief Complaint:  Chief Complaint  Patient presents with   Shoulder Injury   HPI: Alex [name] is a 47 y.o. male with medical history significant of polysubstance use, EtOH abuse, anxiety and depression, seizures and recurrent shoulder dislocations presented via EMS from [facility] with left shoulder dislocation. Multiple attempts were made by ED doc to reduce patient's shoulder we will appropriate for an intra-articular block without success. Orthopedic surgery was consulted for evaluation. Patient was taken to the OR had a successful left shoulder closed reduction with manipulation. TRH was consulted by orthopedic surgery to admit patient for possible EtOH withdrawal.  Patient evaluated in the PACU somnolent. He was only able to answer a few questions while dozing off repetitively. He reports that his last drink was 1 to 2 days ago. He endorsed pain in his left shoulder.   Review of Systems: As mentioned in the history of present illness. All other systems reviewed and are negative. Past Medical History:  Diagnosis Date   Alcohol abuse    Anxiety    Cocaine abuse (HCC) 05/27/2016   Depression    Seizures (HCC)    Past Surgical History:  Procedure Laterality Date   FINGER SURGERY     SHOULDER CLOSED REDUCTION Left 03/14/2023   Procedure: Radiological Exam in OR;  Surgeon: Reinaldo Berber, MD;  Location: [facility] ORS;  Service: Orthopedics;  Laterality: Left;   SHOULDER CLOSED REDUCTION Left 06/30/2023   Procedure: CLOSED REDUCTION SHOULDER;  Surgeon: Signa Kell, MD;  Location: [facility] ORS;  Service: Orthopedics;  Laterality: Left;   Social History:  reports that he has been smoking cigarettes and e-cigarettes. He has never used smokeless tobacco. He reports current alcohol use of about  4.0 - 5.0 standard drinks of alcohol per week. He reports current drug use. Drugs: Marijuana and Cocaine.  Allergies  Allergen Reactions   Other Itching    Peanut Butter   Peanut Butter Flavor     Other Reaction(s): Unknown    History reviewed. No pertinent family history.  Prior to Admission medications   Medication Sig Start Date End Date Taking? Authorizing Provider  propranolol (INDERAL) 20 MG tablet Take 20 mg by mouth 2 (two) times daily.   Yes [provider]  acetaminophen (TYLENOL) 500 MG tablet Take 2 tablets (1,000 mg total) by mouth every 8 (eight) hours. 06/30/23 06/29/24  Signa Kell, MD  citalopram (CELEXA) 40 MG tablet Take 40 mg by mouth daily.    [provider]  lithium carbonate 300 MG capsule Take 300 mg by mouth 2 (two) times daily with a meal.    [provider]  meloxicam (MOBIC) 15 MG tablet Take 1 tablet (15 mg total) by mouth daily. 06/30/23 06/29/24  Signa Kell, MD  omeprazole (PRILOSEC) 20 MG capsule Take 20 mg by mouth 2 (two) times daily before a meal.    [provider]  ondansetron (ZOFRAN-ODT) 4 MG disintegrating tablet Take 1 tablet (4 mg total) by mouth every 6 (six) hours as needed for nausea or vomiting. 08/20/23   Ward, Layla Maw, DO  promethazine (PHENERGAN) 25 MG tablet Take 25 mg by mouth at bedtime.    [provider]  traZODone (DESYREL) 150 MG tablet Take 150 mg by mouth at bedtime.    [provider]  triamterene-hydrochlorothiazide (DYAZIDE) 37.5-25 MG capsule Take 1 capsule by mouth daily.    [provider]  valproic acid (DEPAKENE) 250 MG capsule Take 3 capsules (750 mg total) by mouth at bedtime. 06/23/23 09/21/23  Pilar Jarvis, MD    Physical Exam: Vitals:   08/31/23 1915 08/31/23 1930 08/31/23 2000 08/31/23 2048  BP: (!) 154/100 (!) 162/102  (!) 135/98  Pulse: 74 65 61 70  Resp: 13 19 13 18   Temp:   (!) 97 F (36.1 C) 98.2 F (36.8 C)  TempSrc:    Oral  SpO2: 98% 93%  (!) 83% 99%  Weight:      Height:    5\' 6"  (1.676 m)   General: Obese middle-age man laying comfortably in PACU bed. No acute distress. HEENT: [place]/AT. Anicteric sclera CV: RRR. No murmurs, rubs, or gallops. No LE edema Pulmonary: Lungs CTAB. Normal effort. No wheezing or rales. Abdominal: Soft, nontender, nondistended. Normal bowel sounds. Extremities: Palpable radial and DP pulses. Normal ROM. L shoulder in a sling. Skin: Warm and dry. No obvious rash or lesions. Neuro: Somnolent due to anesthesia. Moves all extremities. Normal sensation to light touch. No focal deficit.  Data Reviewed: Initial left shoulder x-ray shows anterior dislocation of the left glenohumeral joint Repeat x-ray after shoulder reduction in the OR confirms anatomical reduction. Labs show sodium 135, K+ 4.1, creatinine 1.10, WBC 6.5, Hgb 12.6, platelet 188, normal ethanol levels. EKG shows normal sinus rhythm.  Assessment and Plan:  Alex [name] is a 47 y.o. male with medical history significant of polysubstance use, EtOH abuse, anxiety and depression, seizures and recurrent shoulder dislocations presented with left shoulder dislocation and admitted for monitoring after shoulder reduction.  # Polysubstance use disorder # Alcohol use disorder # EtOH withdrawal Patient with significant EtOH abuse history presented for recurrent shoulder dislocation that has now been reduced. Reports that his last drink was 1 to 2 days ago. Will admit for observation overnight due to concern for EtOH withdrawal. -Admit to telemetry bed -CIWA with Ativan -Multivitamin, folate and B12 -TOC consulted, appreciate recs  # Left shoulder dislocation This is patient's fourth shoulder dislocation since the May of this year.  Shoulder unable to be reduced in the ED. Patient had successful reduction in the OR by orthopedic surgery. -Orthopedic surgery following, appreciate assistance -Recommending activity weightbearing as tolerated -Pain  control with Percocet and IV Dilaudid  # HTN Initially hypotensive with SBP in the 90s on arrival but BP now hypertensive with SBP in the 130s to 160s. -Continue Triamterene-HCTZ and propranolol  # Seizure disorder No seizure episode during hospitalization. -Continue lithium and valproic acid  # Anxiety and depression Patient has Celexa on his med list but unclear if he is currently taking this as last refill was in July.  # Homelessness Patient currently staying in a motel. -TOC consulted, appreciate assistance with placement   Advance Care Planning:   Code Status: Full Code   Consults: Orthopedic surgery  Family Communication: No family at bedside  Severity of Illness: The appropriate patient status for this patient is OBSERVATION. Observation status is judged to be reasonable and necessary in order to provide the required intensity of service to ensure the patient's safety. The patient's presenting symptoms, physical exam findings, and initial radiographic and laboratory data in the context of their medical condition is felt to place them at decreased risk for further clinical deterioration. Furthermore, it is anticipated that the patient will be medically stable for discharge from the  hospital within 2 midnights of admission.   Author: Steffanie Rainwater, MD 08/31/2023 9:32 PM  For on call review [email or web address].

## 2023-08-31 NOTE — ED Provider Notes (Addendum)
[facility] Provider Note    Event Date/Time   First MD Initiated Contact with Patient 08/31/23 [phone]     (approximate)   History   Shoulder Injury   HPI  Alex [name] is a 47 y.o. male past medical history significant for recurrent shoulder dislocation, polysubstance abuse, alcohol abuse, presents to the emergency department with left shoulder pain.  States that he was rolling over in bed last night and felt a pop to his left shoulder.  Dislocated.  Pain since that time.  States that he has had multiple shoulder dislocations in the past.  Last used cocaine and alcohol yesterday around 6 PM.  Denies any numbness or weakness.     Physical Exam   Triage Vital Signs: ED Triage Vitals  Encounter Vitals Group     BP 08/31/23 0811 (!) 130/90     Systolic BP Percentile --      Diastolic BP Percentile --      Pulse Rate 08/31/23 0811 88     Resp 08/31/23 0811 18     Temp 08/31/23 0811 98.2 F (36.8 C)     Temp Source 08/31/23 0811 Oral     SpO2 08/31/23 0811 99 %     Weight 08/31/23 0809 200 lb (90.7 kg)     Height 08/31/23 0809 5\' 10"  (1.778 m)     Head Circumference --      Peak Flow --      Pain Score 08/31/23 0809 10     Pain Loc --      Pain Education --      Exclude from Growth Chart --     Most recent vital signs: Vitals:   08/31/23 1400 08/31/23 1500  BP: (!) 165/102 (!) 154/110  Pulse: 74 (!) 57  Resp: 17 18  Temp:    SpO2: 100% 96%    Physical Exam Constitutional:      Appearance: He is well-developed.  HENT:     Head: Atraumatic.  Eyes:     Conjunctiva/sclera: Conjunctivae normal.  Cardiovascular:     Rate and Rhythm: Regular rhythm.  Pulmonary:     Effort: No respiratory distress.  Musculoskeletal:     Cervical back: Normal range of motion.     Comments: Left shoulder with obvious deformity.  +2 radial pulses.  Good grip strength.  Normal sensation to the lateral aspect of the upper arm and the forearm.  Skin:     General: Skin is warm.     Capillary Refill: Capillary refill takes less than 2 seconds.  Neurological:     Mental Status: He is alert. Mental status is at baseline.     IMPRESSION / MDM / ASSESSMENT AND PLAN / ED COURSE  I reviewed the triage vital signs and the nursing notes.  Differential diagnosis including fracture, dislocation, musculoskeletal strain  EKG  I, Corena Herter, the attending physician, personally viewed and interpreted this ECG.   Rate: Normal  Rhythm: Normal sinus  Axis: Normal  Intervals: Normal  ST&T Change: None  No tachycardic or bradycardic dysrhythmias while on cardiac telemetry.  RADIOLOGY I independently reviewed imaging, my interpretation of imaging: X-ray of the left shoulder with anterior shoulder dislocation.  LABS (all labs ordered are listed, but only abnormal results are displayed) Labs interpreted as -    Labs Reviewed  BASIC METABOLIC PANEL - Abnormal; Notable for the following components:      Result Value   Glucose, Bld 169 (*)  All other components within normal limits  CBC - Abnormal; Notable for the following components:   Hemoglobin 12.6 (*)    RDW 17.9 (*)    All other components within normal limits  ETHANOL     MDM    On chart review patient had multiple failed attempts of shoulder dislocation in the past, 2 OR visits for failed reduction in the emergency department.  Will attempt procedural sedation with dislocation manipulation  Soft blood pressures prior to sedation.  Given a liter of fluids.  Improvement of blood pressure.  Blood pressure stable prior to sedation.  Lab work checked with no significant electrolyte abnormality.  Alcohol level is negative.  States that he last used cocaine more than 12 hours ago.  Attempted propofol sedation given that he had propofol sedation multiple times in the past.  Became very tense, thrashed around, given Versed, unable to get deep sedation and concern for adverse reaction of  propofol secondary to polysubstance abuse.  Do not feel that it would be safe for further sedation in the emergency department for manipulation and attempt of reduction.  Discussed with orthopedics on-call who will reach out to discuss operative management.  Discussed with Dr. Steward Drone -unable to go to the OR at this hospital.  Discussed with orthopedic physicians at this hospital and also unable to take to the OR.  Recommended repeat dose of pain medication and intra-articular block with manipulation.  Attempted intra-articular block, given Versed for anxiety.  Given Dilaudid for pain control.  Attempted manipulation multiple times.  Patient continues to be in significant pain, unable to get a relaxed position, unsuccessful after multiple attempts and do not feel that repeat procedural sedation in the emergency department would be safe for the patient.  Again reached out with Dr. Steward Drone who recommended transfer to the facility that he is at in order to undergo reduction.  3:54 PM after multiple discussions ultimately Dr. Steward Drone plans to perform operative fixation at 6 PM today.   PROCEDURES:  Critical Care performed: No  .Ortho Injury Treatment  Date/Time: 08/31/2023 11:56 AM  Performed by: Corena Herter, MD Authorized by: Corena Herter, MD   Consent:    Consent obtained:  Verbal and written   Consent given by:  Patient   Risks discussed:  Fracture, nerve damage, restricted joint movement and vascular damage   Alternatives discussed:  No treatment and delayed treatmentInjury location: shoulder Location details: left shoulder Injury type: dislocation Dislocation type: anterior Hill-Sachs deformity: no Chronicity: recurrent Pre-procedure neurovascular assessment: neurovascularly intact Pre-procedure distal perfusion: normal Pre-procedure neurological function: normal Pre-procedure range of motion: reduced  Patient sedated: Yes. Refer to sedation procedure documentation for details of  sedation. Manipulation performed: yes Reduction method: external rotation, Milch technique, scapular manipulation and traction and counter traction Reduction successful: no Post-procedure neurovascular assessment: post-procedure neurovascularly intact Post-procedure distal perfusion: normal Post-procedure neurological function: normal Post-procedure range of motion: unchanged   .Sedation  Date/Time: 08/31/2023 11:57 AM  Performed by: Corena Herter, MD Authorized by: Corena Herter, MD   Consent:    Consent obtained:  Verbal   Consent given by:  Patient   Risks discussed:  Allergic reaction, dysrhythmia, inadequate sedation, nausea, prolonged hypoxia resulting in organ damage, prolonged sedation necessitating reversal, respiratory compromise necessitating ventilatory assistance and intubation and vomiting   Alternatives discussed:  Analgesia without sedation, anxiolysis and regional anesthesia Universal protocol:    Procedure explained and questions answered to patient or proxy's satisfaction: yes     Relevant documents present and  verified: yes     Test results available: yes     Imaging studies available: yes     Required blood products, implants, devices, and special equipment available: yes     Site/side marked: yes     Immediately prior to procedure, a time out was called: yes     Patient identity confirmed:  Verbally with patient Indications:    Procedure performed:  Dislocation reduction   Procedure necessitating sedation performed by:  Physician performing sedation Pre-sedation assessment:    Time since last food or drink:  Last night > 6 hrs   ASA classification: class 2 - patient with mild systemic disease     Mouth opening:  3 or more finger widths   Thyromental distance:  4 finger widths   Mallampati score:  I - soft palate, uvula, fauces, pillars visible   Neck mobility: normal     Pre-sedation assessments completed and reviewed: airway patency, cardiovascular  function, hydration status, mental status, nausea/vomiting, pain level, respiratory function and temperature   A pre-sedation assessment was completed prior to the start of the procedure Immediate pre-procedure details:    Reassessment: Patient reassessed immediately prior to procedure     Reviewed: vital signs, relevant labs/tests and NPO status     Verified: bag valve mask available, emergency equipment available, intubation equipment available, IV patency confirmed, oxygen available and suction available   Procedure details (see MAR for exact dosages):    Preoxygenation:  Nasal cannula   Sedation:  Propofol   Intended level of sedation: moderate (conscious sedation)   Intra-procedure monitoring:  Blood pressure monitoring, cardiac monitor, continuous pulse oximetry, frequent LOC assessments, frequent vital sign checks and continuous capnometry   Intra-procedure events comment:  Myoclonic shaking and jerking immediately following propofol   Total Provider sedation time (minutes):  12 Post-procedure details:   A post-sedation assessment was completed following the completion of the procedure.   Attendance: Constant attendance by certified staff until patient recovered     Recovery: Patient returned to pre-procedure baseline     Post-sedation assessments completed and reviewed: airway patency, cardiovascular function, hydration status, mental status, nausea/vomiting, pain level, respiratory function and temperature     Patient is stable for discharge or admission: yes     Procedure completion:  Tolerated well, no immediate complications .Nerve Block  Date/Time: 08/31/2023 1:46 PM  Performed by: Corena Herter, MD Authorized by: Corena Herter, MD   Consent:    Consent obtained:  Verbal   Consent given by:  Patient   Risks discussed:  Allergic reaction, infection, nerve damage, swelling and unsuccessful block   Alternatives discussed:  No treatment Universal protocol:    Procedure  explained and questions answered to patient or proxy's satisfaction: yes     Relevant documents present and verified: yes     Test results available: yes     Imaging studies available: yes     Required blood products, implants, devices, and special equipment available: yes     Site/side marked: yes     Immediately prior to procedure, a time out was called: yes     Patient identity confirmed:  Verbally with patient Indications:    Indications:  Pain relief and procedural anesthesia Location:    Body area:  Upper extremity   Upper extremity nerve blocked: Intra-articular left shoulder.   Laterality:  Left Pre-procedure details:    Skin preparation:  Chlorhexidine   Preparation: Patient was prepped and draped in usual sterile fashion   Skin  anesthesia:    Skin anesthesia method:  None Procedure details:    Block needle gauge:  18 G   Anesthetic injected:  Lidocaine 1% w/o epi   Injection procedure:  Anatomic landmarks identified   Paresthesia:  None Post-procedure details:    Dressing:  None   Outcome:  Pain improved   Procedure completion:  Tolerated Comments:     Attempted pain control and again orthopedic manipulation without success   Patient's presentation is most consistent with acute presentation with potential threat to life or bodily function.   MEDICATIONS ORDERED IN ED: Medications  midazolam PF (VERSED) 10 MG/2ML injection (0 mg  Hold 08/31/23 1149)  midazolam (VERSED) 2 MG/2ML injection (0 mg  Hold 08/31/23 1316)  propofol (DIPRIVAN) 10 mg/mL bolus/IV push 100 mg (100 mg Intravenous Given 08/31/23 1133)  sodium chloride 0.9 % bolus 1,000 mL (0 mLs Intravenous Stopped 08/31/23 1103)  sodium chloride 0.9 % bolus 1,000 mL (0 mLs Intravenous Stopped 08/31/23 1402)  midazolam (VERSED) injection (2 mg Intravenous Given 08/31/23 1142)  lidocaine (PF) (XYLOCAINE) 1 % injection 30 mL (30 mLs Other Given 08/31/23 1244)  HYDROmorphone (DILAUDID) injection 1 mg (1 mg Intravenous  Given 08/31/23 1236)  midazolam (VERSED) injection 2 mg (2 mg Intravenous Given 08/31/23 1303)    FINAL CLINICAL IMPRESSION(S) / ED DIAGNOSES   Final diagnoses:  Traumatic closed displaced fracture of left shoulder with anterior dislocation, initial encounter  Polysubstance abuse (HCC)     Rx / DC Orders   ED Discharge Orders     None        Note:  This document was prepared using Dragon voice recognition software and may include unintentional dictation errors.   Corena Herter, MD 08/31/23 1159    Corena Herter, MD 08/31/23 1349    Corena Herter, MD 08/31/23 1554

## 2023-08-31 NOTE — Op Note (Signed)
   Date of Surgery: 08/31/2023  INDICATIONS: Alex [name] is a 47 y.o.-year-old male with left anterior shoulder dislocation.  The risk and benefits of the procedure were discussed in detail and documented in the pre-operative evaluation.   PREOPERATIVE DIAGNOSIS: 1.  Left recurrent anterior shoulder dislocation  POSTOPERATIVE DIAGNOSIS: Same.  PROCEDURE: 1.  Left shoulder close reduction with manipulation  SURGEON: Benancio Deeds MD  ASSISTANT: None  ANESTHESIA:  MAC  IV FLUIDS AND URINE: See anesthesia record.  ANTIBIOTICS: None  ESTIMATED BLOOD LOSS: None  IMPLANTS:  * No implants in log *  DRAINS: None  CULTURES: None  COMPLICATIONS: none  DESCRIPTION OF PROCEDURE:   Patient presents the preoperative holding area.  Correct site was marked according humerus protocol.  He is subsequent taken back to the operating room.  Anesthesia was induced.  Traction countertraction method was used to pull traction on the left shoulder.  A clunk was heard.  AP radiograph confirmed anatomic reduction.  Patient was awoken taken to the PACU without complication.    POSTOPERATIVE PLAN: He will be activity weightbearing as tolerated.  He will be admitted to the hospital to be assessed for any type of drug withdrawal given his significant polysubstance abuse.  Benancio Deeds, MD 7:30 PM

## 2023-08-31 NOTE — Transfer of Care (Signed)
Immediate Anesthesia Transfer of Care Note  Patient: Alex [name]  Procedure(s) Performed: CLOSED REDUCTION SHOULDER (Left: Shoulder)  Patient Location: PACU  Anesthesia Type:General  Level of Consciousness: drowsy  Airway & Oxygen Therapy: Patient Spontanous Breathing and Patient connected to face mask oxygen  Post-op Assessment: Report given to RN and Post -op Vital signs reviewed and stable  Post vital signs: Reviewed and stable  Last Vitals:  Vitals Value Taken Time  BP 156/107 08/31/23 1839  Temp 36.1 C 08/31/23 1840  Pulse 72 08/31/23 1842  Resp 12 08/31/23 1842  SpO2 99 % 08/31/23 1842  Vitals shown include unfiled device data.  Last Pain:  Vitals:   08/31/23 1649  TempSrc:   PainSc: Asleep         Complications: No notable events documented.

## 2023-08-31 NOTE — Anesthesia Procedure Notes (Signed)
Procedure Name: General with mask airway Date/Time: 08/31/2023 6:17 PM  Performed by: Irving Burton, CRNAPre-anesthesia Checklist: Emergency Drugs available, Patient identified, Suction available, Patient being monitored and Timeout performed Patient Re-evaluated:Patient Re-evaluated prior to induction Oxygen Delivery Method: Circle system utilized Preoxygenation: Pre-oxygenation with 100% oxygen Induction Type: IV induction Ventilation: Mask ventilation without difficulty Placement Confirmation: positive ETCO2

## 2023-08-31 NOTE — ED Notes (Signed)
Report given to OR at this time. OR to take patient after 6pm.

## 2023-08-31 NOTE — ED Provider Notes (Signed)
  Physical Exam  BP (!) 137/95   Pulse 75   Temp 98.1 F (36.7 C) (Oral)   Resp 18   Ht 5\' 10"  (1.778 m)   Wt 90.7 kg   SpO2 98%   BMI 28.70 kg/m   Physical Exam  Procedures  Procedures  ED Course / MDM    Medical Decision Making Amount and/or Complexity of Data Reviewed Labs: ordered. Radiology: ordered.  Risk Prescription drug management. Decision regarding hospitalization.   Received patient in signout.  47 year old male with history of recurrent shoulder dislocations and polysubstance abuse presenting today for left shoulder dislocation.  Previous provider attempted multiple times both with propofol and intra-articular block without success.  Orthopedics was consulted for discussion of repair in the OR.  Initial discussion was transferred to a different facility.  Ultimately, orthopedics will plan to take to the OR around 6 PM.  Patient pending return to the OR at time of signout.  Patient taken to the OR.  Given that he is homeless, orthopedics recommends admission overnight for monitoring postop.  Patient mid to hospitalist for further care.     Janith Lima, MD 08/31/23 [phone]

## 2023-08-31 NOTE — Anesthesia Preprocedure Evaluation (Signed)
Anesthesia Evaluation  Patient identified by MRN, date of birth, ID band Patient awake  General Assessment Comment:Patient AO x 3, though appears sleepy. Able to answer all my questions. Appears to have capacity to consent  Reviewed: Allergy & Precautions, NPO status , Patient's Chart, lab work & pertinent test results  History of Anesthesia Complications Negative for: history of anesthetic complications  Airway Mallampati: III  TM Distance: >3 FB Neck ROM: Full    Dental  (+) Poor Dentition, Dental Advidsory Given   Pulmonary neg shortness of breath, neg sleep apnea, neg COPD, Recent URI , Current Smoker and Patient abstained from smoking.   Pulmonary exam normal breath sounds clear to auscultation       Cardiovascular Exercise Tolerance: Good (-) hypertension(-) angina (-) CAD and (-) Past MI negative cardio ROS (-) dysrhythmias  Rhythm:Regular Rate:Normal - Systolic murmurs    Neuro/Psych  PSYCHIATRIC DISORDERS Anxiety Depression       GI/Hepatic ,neg GERD  ,,(+)     substance abuse  alcohol use and cocaine useDenies cocaine use today   Endo/Other  neg diabetes    Renal/GU      Musculoskeletal   Abdominal   Peds  Hematology   Anesthesia Other Findings Past Medical History: No date: Alcohol abuse No date: Anxiety 05/27/2016: Cocaine abuse (HCC) No date: Depression No date: Seizures (HCC)  Reproductive/Obstetrics                             Anesthesia Physical Anesthesia Plan  ASA: 3  Anesthesia Plan: General   Post-op Pain Management: Minimal or no pain anticipated   Induction: Intravenous  PONV Risk Score and Plan: 3 and Propofol infusion, TIVA and Ondansetron  Airway Management Planned: Nasal Cannula  Additional Equipment: None  Intra-op Plan:   Post-operative Plan:   Informed Consent: I have reviewed the patients History and Physical, chart, labs and discussed the  procedure including the risks, benefits and alternatives for the proposed anesthesia with the patient or authorized representative who has indicated his/her understanding and acceptance.     Dental advisory given  Plan Discussed with: CRNA and Surgeon  Anesthesia Plan Comments: (Discussed risks of anesthesia with patient, including possibility of difficulty with spontaneous ventilation under anesthesia necessitating airway intervention, PONV, and rare risks such as cardiac or respiratory or neurological events, and allergic reactions. Discussed the role of CRNA in patient's perioperative care. Patient understands.)       Anesthesia Quick Evaluation

## 2023-08-31 NOTE — Brief Op Note (Signed)
   Brief Op Note  Date of Surgery: 08/31/2023  Preoperative Diagnosis: Left shoulder dislocation  Postoperative Diagnosis: same  Procedure: Procedure(s): CLOSED REDUCTION SHOULDER  Implants: * No implants in log *  Surgeons: Surgeon(s): Huel Cote, MD  Anesthesia: Choice    Estimated Blood Loss: See anesthesia record  Complications: None  Condition to PACU: Stable  Benancio Deeds, MD 08/31/2023 7:30 PM

## 2023-08-31 NOTE — ED Triage Notes (Signed)
Patient to ED via [facility] from the [facility] for left shoulder dislocation. States it occurred in his sleep. Last used cocaine and alcohol last PM. NAD noted. Hx of dislocation.

## 2023-09-01 ENCOUNTER — Encounter: Payer: Self-pay | Admitting: Orthopaedic Surgery

## 2023-09-01 DIAGNOSIS — S43015A Anterior dislocation of left humerus, initial encounter: Secondary | ICD-10-CM | POA: Diagnosis not present

## 2023-09-01 DIAGNOSIS — Z5900 Homelessness unspecified: Secondary | ICD-10-CM

## 2023-09-01 DIAGNOSIS — R569 Unspecified convulsions: Secondary | ICD-10-CM

## 2023-09-01 LAB — PHOSPHORUS: Phosphorus: 3.3 mg/dL (ref 2.5–4.6)

## 2023-09-01 LAB — MAGNESIUM: Magnesium: 2.2 mg/dL (ref 1.7–2.4)

## 2023-09-01 MED ORDER — TRAMADOL HCL 50 MG PO TABS: 50.0000 mg | ORAL_TABLET | Freq: Four times a day (QID) | ORAL | 0 refills | Status: AC | PRN

## 2023-09-01 MED ORDER — ORAL CARE MOUTH RINSE: 15.0000 mL | OROMUCOSAL | Status: DC | PRN

## 2023-09-01 NOTE — Progress Notes (Signed)
Patient at the desk asking to leave. Educated that MD is putting in orders at this time. MD has been notified. Pt cussing at staff at desk. Pt educated that behavior is unacceptable. Pt called his mother from desk. This RN spoke with pt's mother and she stated that she couldn't wait on him. Pt then stated he was leaving. Attempted to educate patient on d/c plan and need to pick up meds. Pt stated "I can get my own meds." Pt refused to sign AMA paperwork and was directed to ER per his request. AMA paperwork placed on patient's chart.

## 2023-09-01 NOTE — Discharge Summary (Signed)
Physician Discharge Summary   Patient: Alex [name] MRN: [id number] DOB: 10/13/1975  Admit date:     08/31/2023  Discharge date: 09/01/23  Discharge Physician: Jonah Blue   PCP: Dortha Kern, MD   Recommendations at discharge:   Weight-bearing as tolerated Wear shoulder sling for comfort Follow up with orthopedics Dr. Steward Drone in 2-3 weeks Follow up with Dr. Quillian Quince in 1-2 weeks STOP using cocaine, alcohol You are being given a very limited number of pain medications (Ultram or tramadol) - do not take while using alcohol or other illicit substances  Discharge Diagnoses: Principal Problem:   Anterior dislocation of left shoulder Active Problems:   Polysubstance abuse (HCC)   Obesity (BMI 30-39.9)   Cocaine use disorder, moderate, dependence (HCC)   Homelessness   Seizure [facility])    Hospital Course: 47yo with h/o polysubstance/ETOH abuse, seizure d/o, and multiple prior shoulder dislocations who presented from [facility] with a L shoulder dislocation.  Failed attempts at reduction in the ER so he was taken to the OR where he underwent successful L shoulder closed reduction with manipulation.  He was placed on CIWA protocol post-operatively.  Assessment and Plan:    Left shoulder dislocation This is patient's fourth shoulder dislocation since the May of this year Shoulder unable to be reduced in the ED Patient had successful reduction in the OR by orthopedic surgery Orthopedic surgery following, appreciate assistance Recommending activity weightbearing as tolerated Pain control with very limited number of Ultram  Polysubstance use disorder/Alcohol use disorder Patient with significant EtOH abuse history presented for recurrent shoulder dislocation that has now been reduced.  Reports that his last drink was 1 to 2 days ago, negative ETOH level on presentation Observed overnight due to concern for EtOH withdrawal CIWA with Ativan Multivitamin, folate and B12 TOC  consulted, appreciate recs Patient reports having a ride now and wanting to go home   HTN Initially hypotensive with SBP in the 90s on arrival but BP now hypertensive with SBP in the 130s to 170s Continue Triamterene-HCTZ and propranolol   Seizure disorder No seizure episode during hospitalization Continue lithium and valproic acid   Anxiety and depression Patient has Celexa on his med list but unclear if he is currently taking this as last refill was in July.   Homelessness Patient currently staying in a motel. TOC consulted, appreciate assistance with placement  Class 1 Obesity Body mass index is 32.28 kg/m.Marland Kitchen  Weight loss should be encouraged Outpatient PCP/bariatric medicine f/u encouraged       Consultants: Orthopedics TOC team   Procedures: L shoulder closed reduction with manipulation 12/4   Antibiotics: None    Pain control - [place] Controlled Substance Reporting System database was reviewed. and patient was instructed, not to drive, operate heavy machinery, perform activities at heights, swimming or participation in water activities or provide baby-sitting services while on Pain, Sleep and Anxiety Medications; until their outpatient Physician has advised to do so again. Also recommended to not to take more than prescribed Pain, Sleep and Anxiety Medications.   Disposition: Home Diet recommendation:  Regular diet DISCHARGE MEDICATION: Allergies as of 09/01/2023       Reactions   Other Itching   Peanut Butter   Peanut Butter Flavor    Other Reaction(s): Unknown        Medication List     STOP taking these medications    promethazine 25 MG tablet Commonly known as: PHENERGAN       TAKE these  medications    acetaminophen 500 MG tablet Commonly known as: TYLENOL Take 2 tablets (1,000 mg total) by mouth every 8 (eight) hours.   citalopram 40 MG tablet Commonly known as: CELEXA Take 40 mg by mouth daily.   lithium carbonate 300 MG  capsule Take 300 mg by mouth 2 (two) times daily with a meal.   meloxicam 15 MG tablet Commonly known as: MOBIC Take 1 tablet (15 mg total) by mouth daily.   omeprazole 20 MG capsule Commonly known as: PRILOSEC Take 20 mg by mouth 2 (two) times daily before a meal.   ondansetron 4 MG disintegrating tablet Commonly known as: ZOFRAN-ODT Take 1 tablet (4 mg total) by mouth every 6 (six) hours as needed for nausea or vomiting.   propranolol 20 MG tablet Commonly known as: INDERAL Take 20 mg by mouth 2 (two) times daily.   traMADol 50 MG tablet Commonly known as: Ultram Take 1 tablet (50 mg total) by mouth every 6 (six) hours as needed.   traZODone 150 MG tablet Commonly known as: DESYREL Take 150 mg by mouth at bedtime.   triamterene-hydrochlorothiazide 37.5-25 MG capsule Commonly known as: DYAZIDE Take 1 capsule by mouth daily.   valproic acid 250 MG capsule Commonly known as: DEPAKENE Take 3 capsules (750 mg total) by mouth at bedtime.        Discharge Exam:    Subjective: Still having pain, would like pain medication for home use. Not interested in rehab.    Objective: Vitals:   09/01/23 0114 09/01/23 0905  BP: (!) 127/96 (!) 170/91  Pulse: 70 71  Resp: 16 17  Temp: 97.9 F (36.6 C) 97.8 F (36.6 C)  SpO2: 96% 97%    Intake/Output Summary (Last 24 hours) at 09/01/2023 1210 Last data filed at 09/01/2023 1115 Gross per 24 hour  Intake 440 ml  Output 1100 ml  Net -660 ml   Filed Weights   08/31/23 0809  Weight: 90.7 kg    Exam:  General:  Appears calm and comfortable and is in NAD Eyes:  EOMI, normal lids, iris ENT:  grossly normal hearing, lips & tongue, mmm Neck:  no LAD, masses or thyromegaly Cardiovascular:  RRR, no m/r/g. No LE edema.  Respiratory:   CTA bilaterally with no wheezes/rales/rhonchi.  Normal respiratory effort. Abdomen:  soft, NT, ND Skin:  no rash or induration seen on limited exam Musculoskeletal:  LUE in  sling Psychiatric:  blunted mood and affect, speech fluent and appropriate, AOx3 Neurologic:  CN 2-12 grossly intact  Data Reviewed: I have reviewed the patient's lab results since admission.  Pertinent labs for today include:   Normal Mag++, Phos ETOH <10 UDS pending    Condition at discharge: improving  The results of significant diagnostics from this hospitalization (including imaging, microbiology, ancillary and laboratory) are listed below for reference.   Imaging Studies: DG C-Arm 1-60 Min-No Report  Result Date: 08/31/2023 Fluoroscopy was utilized by the requesting physician.  No radiographic interpretation.   DG Shoulder Left  Result Date: 08/31/2023 CLINICAL DATA:  Left shoulder dislocation EXAM: LEFT SHOULDER - 2 VIEW COMPARISON:  Same day left shoulder radiograph FINDINGS: Persistent anterior dislocation of the left shoulder. Hill-Sachs deformity of the lateral humeral head. IMPRESSION: Persistent anterior dislocation of the left shoulder. Electronically Signed   By: Agustin Cree M.D.   On: 08/31/2023 16:00   DG Shoulder Left  Result Date: 08/31/2023 CLINICAL DATA:  Left shoulder dislocation. EXAM: LEFT SHOULDER - 2+ VIEW COMPARISON:  June 30, 2023. FINDINGS: Anterior dislocation of left glenohumeral joint is noted. No definite fracture is noted. Defect is seen involving greater tuberosity of proximal left humerus consistent with Hill-Sachs deformity secondary to multiple dislocations. IMPRESSION: Anterior dislocation of left glenohumeral joint is noted Electronically Signed   By: Lupita Raider M.D.   On: 08/31/2023 08:53   DG Chest Portable 1 View  Result Date: 08/20/2023 CLINICAL DATA:  Cough and congestion. EXAM: PORTABLE CHEST 1 VIEW COMPARISON:  05/03/2023 FINDINGS: Normal heart size. Scar versus atelectasis noted within the left lower lung, similar to previous imaging. No definite airspace consolidation. Visualized osseous structures appear unremarkable. No pleural  fluid or interstitial edema. IMPRESSION: Left lower lung scar versus atelectasis. Electronically Signed   By: Signa Kell M.D.   On: 08/20/2023 05:26    Microbiology: Results for orders placed or performed during the hospital encounter of 08/20/23  Resp panel by RT-PCR (RSV, Flu A&B, Covid) Anterior Nasal Swab     Status: None   Collection Time: 08/20/23  4:27 AM   Specimen: Anterior Nasal Swab  Result Value Ref Range Status   SARS Coronavirus 2 by RT PCR NEGATIVE NEGATIVE Final    Comment: (NOTE) SARS-CoV-2 target nucleic acids are NOT DETECTED.  The SARS-CoV-2 RNA is generally detectable in upper respiratory specimens during the acute phase of infection. The lowest concentration of SARS-CoV-2 viral copies this assay can detect is 138 copies/mL. A negative result does not preclude SARS-Cov-2 infection and should not be used as the sole basis for treatment or other patient management decisions. A negative result may occur with  improper specimen collection/handling, submission of specimen other than nasopharyngeal swab, presence of viral mutation(s) within the areas targeted by this assay, and inadequate number of viral copies(<138 copies/mL). A negative result must be combined with clinical observations, patient history, and epidemiological information. The expected result is Negative.  Fact Sheet for Patients:  BloggerCourse.com  Fact Sheet for Healthcare Providers:  SeriousBroker.it  This test is no t yet approved or cleared by the [place] FDA and  has been authorized for detection and/or diagnosis of SARS-CoV-2 by FDA under an Emergency Use Authorization (EUA). This EUA will remain  in effect (meaning this test can be used) for the duration of the COVID-19 declaration under Section 564(b)(1) of the Act, 21 U.S.C.section 360bbb-3(b)(1), unless the authorization is terminated  or revoked sooner.       Influenza A by  PCR NEGATIVE NEGATIVE Final   Influenza B by PCR NEGATIVE NEGATIVE Final    Comment: (NOTE) The Xpert Xpress SARS-CoV-2/FLU/RSV plus assay is intended as an aid in the diagnosis of influenza from Nasopharyngeal swab specimens and should not be used as a sole basis for treatment. Nasal washings and aspirates are unacceptable for Xpert Xpress SARS-CoV-2/FLU/RSV testing.  Fact Sheet for Patients: BloggerCourse.com  Fact Sheet for Healthcare Providers: SeriousBroker.it  This test is not yet approved or cleared by the [place] FDA and has been authorized for detection and/or diagnosis of SARS-CoV-2 by FDA under an Emergency Use Authorization (EUA). This EUA will remain in effect (meaning this test can be used) for the duration of the COVID-19 declaration under Section 564(b)(1) of the Act, 21 U.S.C. section 360bbb-3(b)(1), unless the authorization is terminated or revoked.     Resp Syncytial Virus by PCR NEGATIVE NEGATIVE Final    Comment: (NOTE) Fact Sheet for Patients: BloggerCourse.com  Fact Sheet for Healthcare Providers: SeriousBroker.it  This test is not yet approved or cleared  by the [place] and has been authorized for detection and/or diagnosis of SARS-CoV-2 by FDA under an Emergency Use Authorization (EUA). This EUA will remain in effect (meaning this test can be used) for the duration of the COVID-19 declaration under Section 564(b)(1) of the Act, 21 U.S.C. section 360bbb-3(b)(1), unless the authorization is terminated or revoked.  Performed at [facility], [address] [place], [place] [address]     Labs: CBC: Recent Labs  Lab 08/31/23 0817  WBC 6.5  HGB 12.6*  HCT 39.3  MCV 89.3  PLT 188   Basic Metabolic Panel: Recent Labs  Lab 08/31/23 0817 09/01/23 0410  NA 135  --   K 4.1  --   CL 102  --   CO2 25  --   GLUCOSE 169*   --   BUN 16  --   CREATININE 1.10  --   CALCIUM 9.0  --   MG  --  2.2  PHOS  --  3.3   Liver Function Tests: No results for input(s): "AST", "ALT", "ALKPHOS", "BILITOT", "PROT", "ALBUMIN" in the last 168 hours. CBG: No results for input(s): "GLUCAP" in the last 168 hours.  Discharge time spent: greater than 30 minutes.  Signed: Jonah Blue, MD [facility] 09/01/2023

## 2023-09-01 NOTE — Progress Notes (Signed)
Could hear patient in room from the nurses desk yelling at his mom. Telling her not to leave him. He came to desk and said he was leaving. Pt was being loud and yelling "how do I get to the ED" .Explained to patient by Andrill, the CN, that if he left AMA there was the chance his hospitalization would not be covered. Pt would not sign AMA form. Walked off the unit.

## 2023-09-01 NOTE — Hospital Course (Signed)
47yo with h/o polysubstance/ETOH abuse, seizure d/o, and multiple prior shoulder dislocations who presented from [facility] with a L shoulder dislocation.  Failed attempts at reduction in the ER so he was taken to the OR where he underwent successful L shoulder closed reduction with manipulation.  He was placed on CIWA protocol post-operatively.

## 2023-09-06 ENCOUNTER — Telehealth: Payer: Self-pay | Admitting: Emergency Medicine

## 2023-09-06 ENCOUNTER — Emergency Department
Admission: EM | Admit: 2023-09-06 | Discharge: 2023-09-06 | Discharge status: Against Medical Advice | Payer: MEDICAID | Attending: Emergency Medicine | Admitting: Emergency Medicine

## 2023-09-06 ENCOUNTER — Other Ambulatory Visit: Payer: Self-pay

## 2023-09-06 ENCOUNTER — Emergency Department: Payer: MEDICAID

## 2023-09-06 DIAGNOSIS — S30861A Insect bite (nonvenomous) of abdominal wall, initial encounter: Secondary | ICD-10-CM | POA: Insufficient documentation

## 2023-09-06 DIAGNOSIS — W57XXXA Bitten or stung by nonvenomous insect and other nonvenomous arthropods, initial encounter: Secondary | ICD-10-CM | POA: Diagnosis not present

## 2023-09-06 DIAGNOSIS — Z5321 Procedure and treatment not carried out due to patient leaving prior to being seen by health care provider: Secondary | ICD-10-CM | POA: Insufficient documentation

## 2023-09-06 DIAGNOSIS — R0602 Shortness of breath: Secondary | ICD-10-CM | POA: Diagnosis not present

## 2023-09-06 DIAGNOSIS — Z1152 Encounter for screening for COVID-19: Secondary | ICD-10-CM | POA: Diagnosis not present

## 2023-09-06 LAB — RESP PANEL BY RT-PCR (RSV, FLU A&B, COVID)  RVPGX2
Influenza A by PCR: NEGATIVE
Influenza B by PCR: NEGATIVE
Resp Syncytial Virus by PCR: NEGATIVE
SARS Coronavirus 2 by RT PCR: NEGATIVE

## 2023-09-06 LAB — BASIC METABOLIC PANEL
Anion gap: 14 (ref 5–15)
BUN: 8 mg/dL (ref 6–20)
CO2: 22 mmol/L (ref 22–32)
Calcium: 8.7 mg/dL — ABNORMAL LOW (ref 8.9–10.3)
Chloride: 97 mmol/L — ABNORMAL LOW (ref 98–111)
Creatinine, Ser: 0.64 mg/dL (ref 0.61–1.24)
GFR, Estimated: >60 mL/min (ref >60)
Glucose, Bld: 90 mg/dL (ref 70–99)
Potassium: 3.9 mmol/L (ref 3.5–5.1)
Sodium: 133 mmol/L — ABNORMAL LOW (ref 135–145)

## 2023-09-06 LAB — CBC
HCT: 41.1 % (ref 39.0–52.0)
Hemoglobin: 13.4 g/dL (ref 13.0–17.0)
MCH: 28.2 pg (ref 26.0–34.0)
MCHC: 32.6 g/dL (ref 30.0–36.0)
MCV: 86.5 fL (ref 80.0–100.0)
Platelets: 249 10*3/uL (ref 150–400)
RBC: 4.75 MIL/uL (ref 4.22–5.81)
RDW: 17.7 % — ABNORMAL HIGH (ref 11.5–15.5)
WBC: 7.4 10*3/uL (ref 4.0–10.5)
nRBC: 0 % (ref 0.0–0.2)

## 2023-09-06 LAB — LACTIC ACID, PLASMA: Lactic Acid, Venous: 2 mmol/L (ref 0.5–1.9)

## 2023-09-06 LAB — TROPONIN I (HIGH SENSITIVITY): Troponin I (High Sensitivity): 2 ng/L (ref <18)

## 2023-09-06 NOTE — ED Notes (Signed)
Rainbow and lactic sent to the lab at this time.

## 2023-09-06 NOTE — Telephone Encounter (Signed)
Called patient due to left emergency department before provider exam to inquire about condition and follow up plans. I spoke to elsie--guardian.  She says he is sleeping and he is still feeling bad.  I explained that he should return or see his doctor so he can have exam and they can review lab/xray results.  She says he has appt with pcp on Thursday.  I told her to call pcp and have them review the labs.

## 2023-09-06 NOTE — ED Triage Notes (Signed)
Pt to ED via [facility] c.o SHOB and soider bite to right side of abdomen. Pt reports he has been feeling short of breath for a couple of day, endorses cough and congestion. Pt also has spider bite to abdomen. Wound has redness around it and warm to touch. Pt says he was bit by brown recluse a few days ago
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
## 2023-09-14 ENCOUNTER — Encounter: Payer: Self-pay | Admitting: Orthopedic Surgery

## 2023-09-14 NOTE — Addendum Note (Signed)
Addendum  created 09/14/23 1436 by Stormy Fabian, CRNA   Intraprocedure Event edited

## 2023-09-15 NOTE — Anesthesia Postprocedure Evaluation (Signed)
Anesthesia Post Note  Patient: Alex [name]  Procedure(s) Performed: CLOSED REDUCTION SHOULDER (Left: Shoulder)  Patient location during evaluation: PACU Anesthesia Type: General Level of consciousness: awake and alert Pain management: pain level controlled Vital Signs Assessment: post-procedure vital signs reviewed and stable Respiratory status: spontaneous breathing, nonlabored ventilation, respiratory function stable and patient connected to nasal cannula oxygen Cardiovascular status: blood pressure returned to baseline and stable Postop Assessment: no apparent nausea or vomiting Anesthetic complications: no   No notable events documented.   Last Vitals:  Vitals:   09/01/23 0114 09/01/23 0905  BP: (!) 127/96 (!) 170/91  Pulse: 70 71  Resp: 16 17  Temp: 36.6 C 36.6 C  SpO2: 96% 97%    Last Pain:  Vitals:   09/01/23 0832  TempSrc:   PainSc: Asleep                 Lenard Simmer

## 2023-09-20 ENCOUNTER — Other Ambulatory Visit: Payer: Self-pay

## 2023-09-20 ENCOUNTER — Emergency Department
Admission: EM | Admit: 2023-09-20 | Discharge: 2023-09-20 | Discharge status: Against Medical Advice | Payer: MEDICAID | Attending: Emergency Medicine | Admitting: Emergency Medicine

## 2023-09-20 DIAGNOSIS — F1010 Alcohol abuse, uncomplicated: Secondary | ICD-10-CM | POA: Diagnosis not present

## 2023-09-20 DIAGNOSIS — Y908 Blood alcohol level of 240 mg/100 ml or more: Secondary | ICD-10-CM | POA: Insufficient documentation

## 2023-09-20 DIAGNOSIS — R4689 Other symptoms and signs involving appearance and behavior: Secondary | ICD-10-CM | POA: Diagnosis present

## 2023-09-20 LAB — COMPREHENSIVE METABOLIC PANEL
ALT: 14 U/L (ref 0–44)
AST: 20 U/L (ref 15–41)
Albumin: 3.7 g/dL (ref 3.5–5.0)
Alkaline Phosphatase: 65 U/L (ref 38–126)
Anion gap: 11 (ref 5–15)
BUN: 12 mg/dL (ref 6–20)
CO2: 24 mmol/L (ref 22–32)
Calcium: 8.5 mg/dL — ABNORMAL LOW (ref 8.9–10.3)
Chloride: 98 mmol/L (ref 98–111)
Creatinine, Ser: 0.74 mg/dL (ref 0.61–1.24)
GFR, Estimated: >60 mL/min (ref >60)
Glucose, Bld: 104 mg/dL — ABNORMAL HIGH (ref 70–99)
Potassium: 4.3 mmol/L (ref 3.5–5.1)
Sodium: 133 mmol/L — ABNORMAL LOW (ref 135–145)
Total Bilirubin: 0.5 mg/dL (ref <1.2)
Total Protein: 7 g/dL (ref 6.5–8.1)

## 2023-09-20 LAB — CBC
HCT: 40.3 % (ref 39.0–52.0)
Hemoglobin: 12.8 g/dL — ABNORMAL LOW (ref 13.0–17.0)
MCH: 28.3 pg (ref 26.0–34.0)
MCHC: 31.8 g/dL (ref 30.0–36.0)
MCV: 89.2 fL (ref 80.0–100.0)
Platelets: 218 10*3/uL (ref 150–400)
RBC: 4.52 MIL/uL (ref 4.22–5.81)
RDW: 16.6 % — ABNORMAL HIGH (ref 11.5–15.5)
WBC: 6.2 10*3/uL (ref 4.0–10.5)
nRBC: 0 % (ref 0.0–0.2)

## 2023-09-20 LAB — ACETAMINOPHEN LEVEL: Acetaminophen (Tylenol), Serum: <10 ug/mL — ABNORMAL LOW (ref 10–30)

## 2023-09-20 LAB — ETHANOL: Alcohol, Ethyl (B): 301 mg/dL (ref <10)

## 2023-09-20 LAB — SALICYLATE LEVEL: Salicylate Lvl: <7 mg/dL — ABNORMAL LOW (ref 7.0–30.0)

## 2023-09-20 NOTE — ED Notes (Signed)
Pt putting his clothes back on and saying he is leaving.

## 2023-09-20 NOTE — ED Notes (Signed)
RN notifed Dr. Modesto Charon of critical ETOH level of 301. RN notified provider that pt was A/Ox4, not having any slurred speech, able to walk with no assistance with a steady gait. Provider verbalized understanding.
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
## 2023-09-20 NOTE — ED Notes (Addendum)
Pt dressed out into hospital scrubs by this RN and [place] NT. Belongings placed in belongings bag.  White slides [employer] sweat pants White shirt Black socks [employer] sweater White lighter Black jacket Black sling [employer] vape

## 2023-10-06 ENCOUNTER — Other Ambulatory Visit: Payer: Self-pay

## 2023-10-06 ENCOUNTER — Emergency Department
Admission: EM | Admit: 2023-10-06 | Discharge: 2023-10-06 | Discharge status: Against Medical Advice | Payer: MEDICAID | Attending: Emergency Medicine | Admitting: Emergency Medicine

## 2023-10-06 DIAGNOSIS — Z5321 Procedure and treatment not carried out due to patient leaving prior to being seen by health care provider: Secondary | ICD-10-CM | POA: Insufficient documentation

## 2023-10-06 DIAGNOSIS — J101 Influenza due to other identified influenza virus with other respiratory manifestations: Secondary | ICD-10-CM | POA: Diagnosis not present

## 2023-10-06 DIAGNOSIS — R059 Cough, unspecified: Secondary | ICD-10-CM | POA: Diagnosis present

## 2023-10-06 DIAGNOSIS — Z20822 Contact with and (suspected) exposure to covid-19: Secondary | ICD-10-CM | POA: Diagnosis not present

## 2023-10-06 LAB — RESP PANEL BY RT-PCR (RSV, FLU A&B, COVID)  RVPGX2
Influenza A by PCR: POSITIVE — AB
Influenza B by PCR: NEGATIVE
Resp Syncytial Virus by PCR: NEGATIVE
SARS Coronavirus 2 by RT PCR: NEGATIVE

## 2023-10-06 NOTE — ED Triage Notes (Signed)
 Pt to ED via [facility] c/o cough congestion x3 days. Denies fevers, shob, CP

## 2024-03-13 ENCOUNTER — Encounter: Payer: Self-pay | Admitting: Emergency Medicine

## 2024-03-13 ENCOUNTER — Emergency Department: Payer: MEDICAID

## 2024-03-13 DIAGNOSIS — Y92149 Unspecified place in prison as the place of occurrence of the external cause: Secondary | ICD-10-CM | POA: Diagnosis not present

## 2024-03-13 DIAGNOSIS — Z5321 Procedure and treatment not carried out due to patient leaving prior to being seen by health care provider: Secondary | ICD-10-CM | POA: Insufficient documentation

## 2024-03-13 DIAGNOSIS — R4689 Other symptoms and signs involving appearance and behavior: Secondary | ICD-10-CM | POA: Diagnosis not present

## 2024-03-13 DIAGNOSIS — R519 Headache, unspecified: Secondary | ICD-10-CM | POA: Insufficient documentation

## 2024-03-13 DIAGNOSIS — S41111A Laceration without foreign body of right upper arm, initial encounter: Secondary | ICD-10-CM | POA: Diagnosis not present

## 2024-03-13 DIAGNOSIS — S51811A Laceration without foreign body of right forearm, initial encounter: Secondary | ICD-10-CM | POA: Diagnosis present

## 2024-03-13 DIAGNOSIS — S80812A Abrasion, left lower leg, initial encounter: Secondary | ICD-10-CM | POA: Diagnosis not present

## 2024-03-13 DIAGNOSIS — S60221A Contusion of right hand, initial encounter: Secondary | ICD-10-CM | POA: Diagnosis not present

## 2024-03-13 DIAGNOSIS — S4991XA Unspecified injury of right shoulder and upper arm, initial encounter: Secondary | ICD-10-CM | POA: Diagnosis present
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
## 2024-03-14 ENCOUNTER — Emergency Department
Admission: EM | Admit: 2024-03-14 | Discharge: 2024-03-14 | Disposition: A | Discharge status: Home / Self Care | Payer: MEDICAID | Attending: Emergency Medicine | Admitting: Emergency Medicine

## 2024-03-14 ENCOUNTER — Emergency Department: Payer: MEDICAID

## 2024-03-14 ENCOUNTER — Emergency Department
Admission: EM | Admit: 2024-03-14 | Discharge: 2024-03-14 | Discharge status: Against Medical Advice | Payer: MEDICAID | Source: Home / Self Care

## 2024-03-14 ENCOUNTER — Other Ambulatory Visit: Payer: Self-pay

## 2024-03-14 DIAGNOSIS — S60221A Contusion of right hand, initial encounter: Secondary | ICD-10-CM | POA: Insufficient documentation

## 2024-03-14 DIAGNOSIS — S41111A Laceration without foreign body of right upper arm, initial encounter: Secondary | ICD-10-CM | POA: Insufficient documentation

## 2024-03-14 DIAGNOSIS — Y92149 Unspecified place in prison as the place of occurrence of the external cause: Secondary | ICD-10-CM | POA: Insufficient documentation

## 2024-03-14 DIAGNOSIS — R4689 Other symptoms and signs involving appearance and behavior: Secondary | ICD-10-CM | POA: Insufficient documentation
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
## 2024-03-14 NOTE — ED Notes (Signed)
 ED Provider at bedside.

## 2024-03-14 NOTE — ED Notes (Signed)
 Pt discharged in police custody. Ambulatory at time of departure.
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
## 2024-03-14 NOTE — Discharge Instructions (Addendum)
Your x-ray was normal. Please follow up with your outpatient provider. Please return for any new, worsening, or change in symptoms or other concerns.

## 2024-03-14 NOTE — ED Notes (Signed)
 Pt is in lobby cursing at staff, using derogatory, inapprop language and racial slurs, calling 911 on lobby phone and slamming phone down on counter; [facility] security called to lobby and charge nurse [employer] notified

## 2024-03-14 NOTE — ED Notes (Signed)
 Pt at front desk cussing at staff and asked to have a seat and wait for treatment room. Pt walked over to WR chair and sat down with continued swearing.

## 2024-03-14 NOTE — ED Triage Notes (Signed)
 Pt here via [employer] for medical clearance. Pt was fighting with medical staff at jail and sent to ED for clearance. Pt stable in triage.

## 2024-03-14 NOTE — ED Notes (Signed)
 Pt refusing to cooperate and escorted off of property by [employer].

## 2024-04-20 ENCOUNTER — Emergency Department: Payer: MEDICAID

## 2024-04-20 ENCOUNTER — Emergency Department
Admission: EM | Admit: 2024-04-20 | Discharge: 2024-04-21 | Disposition: A | Discharge status: Home / Self Care | Payer: MEDICAID | Attending: Emergency Medicine | Admitting: Emergency Medicine

## 2024-04-20 DIAGNOSIS — U071 COVID-19: Secondary | ICD-10-CM | POA: Diagnosis not present

## 2024-04-20 DIAGNOSIS — B349 Viral infection, unspecified: Secondary | ICD-10-CM | POA: Diagnosis not present

## 2024-04-20 DIAGNOSIS — R059 Cough, unspecified: Secondary | ICD-10-CM | POA: Diagnosis present

## 2024-04-20 MED ORDER — ONDANSETRON HCL 4 MG/2ML IJ SOLN
4.0000 mg | Freq: Once | INTRAMUSCULAR | Status: AC
  Administered 2024-04-21: 4 mg via INTRAVENOUS
  Filled 2024-04-20: qty 2

## 2024-04-20 MED ORDER — KETOROLAC TROMETHAMINE 15 MG/ML IJ SOLN
15.0000 mg | Freq: Once | INTRAMUSCULAR | Status: AC
  Administered 2024-04-21: 15 mg via INTRAVENOUS
  Filled 2024-04-20: qty 1

## 2024-04-20 MED ORDER — ACETAMINOPHEN 500 MG PO TABS
1000.0000 mg | ORAL_TABLET | Freq: Once | ORAL | Status: AC
  Administered 2024-04-21: 1000 mg via ORAL
  Filled 2024-04-20: qty 2

## 2024-04-20 MED ORDER — SODIUM CHLORIDE 0.9 % IV BOLUS
1000.0000 mL | Freq: Once | INTRAVENOUS | Status: AC
  Administered 2024-04-21: 1000 mL via INTRAVENOUS

## 2024-04-20 NOTE — ED Triage Notes (Signed)
 Pt. In via EMS from bus stop, homeless, found out he had covid in jail 4 weeks ago, c/o of N/V, HA, dizziness, states he feels like he still has covid, 20g LAC, 500ml LR, zofran , vitals PTA HR95, 97% on 4L, 101/70, 98.47f, BSG 146, RR 38

## 2024-04-21 ENCOUNTER — Emergency Department: Payer: MEDICAID

## 2024-04-21 ENCOUNTER — Other Ambulatory Visit: Payer: Self-pay

## 2024-04-21 MED ORDER — NIRMATRELVIR/RITONAVIR (PAXLOVID)TABLET
3.0000 | ORAL_TABLET | Freq: Two times a day (BID) | ORAL | 0 refills | Status: AC
  Filled 2024-04-21: qty 30, 5d supply, fill #0

## 2024-04-21 NOTE — ED Provider Notes (Signed)
 [facility] Provider Note    Event Date/Time   First MD Initiated Contact with Patient 04/20/24 2352     (approximate)   History   Nausea and Headache   HPI  Alex [name] is a 48 y.o. male   Past medical history of substance use, alcohol use anxiety depression, seizures, recurrent shoulder dislocations, presents ED with seeking medical evaluation for his recent COVID diagnosis 4 days ago while in jail.  He reports now being released and living in a motel.  He reports feeling fatigued, having bodyaches and a cough and subjective fever.  He denies any GI or GU symptoms.  He reports drinking alcohol tonight.    External Medical Documents Reviewed: Previous hospital notes      Physical Exam   Triage Vital Signs: ED Triage Vitals  Encounter Vitals Group     BP 04/20/24 2348 116/72     Girls Systolic BP Percentile --      Girls Diastolic BP Percentile --      Boys Systolic BP Percentile --      Boys Diastolic BP Percentile --      Pulse Rate 04/20/24 2348 92     Resp 04/20/24 2348 (!) 22     Temp 04/20/24 2348 98.4 F (36.9 C)     Temp Source 04/20/24 2348 Oral     SpO2 04/20/24 2348 92 %     Weight 04/20/24 2349 225 lb (102.1 kg)     Height 04/20/24 2349 5' 3 (1.6 m)     Head Circumference --      Peak Flow --      Pain Score 04/20/24 2348 0     Pain Loc --      Pain Education --      Exclude from Growth Chart --     Most recent vital signs: Vitals:   04/20/24 2348 04/21/24 0225  BP: 116/72 90/62  Pulse: 92 83  Resp: (!) 22 18  Temp: 98.4 F (36.9 C)   SpO2: 92% 95%    General: Awake, no distress.  CV:  Good peripheral perfusion.  Resp:  Normal effort.  Abd:  No distention.  Other:  Sleepy but arousable answering my questions appropriately.  Clear lungs to auscultation without focality or wheezing.  Soft nontender abdomen.  Neck supple forage motion and nontoxic appearance overall.     ED Results / Procedures /  Treatments   Labs (all labs ordered are listed, but only abnormal results are displayed) Labs Reviewed - No data to display    EKG  ED ECG REPORT I, Ginnie Shams, the attending physician, personally viewed and interpreted this ECG.   Date: 04/21/2024  EKG Time: 2351  Rate: 90  Rhythm: sinus  Axis: nl  Intervals:nl  ST&T Change: nos temi    PROCEDURES:  Critical Care performed: No  Procedures   MEDICATIONS ORDERED IN ED: Medications  ketorolac  (TORADOL ) 15 MG/ML injection 15 mg (15 mg Intravenous Given 04/21/24 0001)  acetaminophen  (TYLENOL ) tablet 1,000 mg (1,000 mg Oral Given 04/21/24 0001)  sodium chloride  0.9 % bolus 1,000 mL (0 mLs Intravenous Stopped 04/21/24 0223)  ondansetron  (ZOFRAN ) injection 4 mg (4 mg Intravenous Given 04/21/24 0002)     IMPRESSION / MDM / ASSESSMENT AND PLAN / ED COURSE  I reviewed the triage vital signs and the nursing notes.  Patient's presentation is most consistent with acute presentation with potential threat to life or bodily function.  Differential diagnosis includes, but is not limited to, viral illness, COVID infection, dehydration, considered but less likely sepsis or meningitis   The patient is on the cardiac monitor to evaluate for evidence of arrhythmia and/or significant heart rate changes.  MDM:  \  Recently diagnosed COVID with symptoms matching the same.  No evidence of bacterial pneumonia given no focality on lung exam, no fever and no respiratory distress.  Patient resting comfortably.  Given a prescription for Paxlovid .  I considered hospitalization for admission or observation however given his overall well appearance, no hypoxemia, no evidence of sepsis, no evidence of bacterial pneumonia or respiratory distress, I think he will be discharged and managed outpatient and return with any new or worsening symptoms.        FINAL CLINICAL IMPRESSION(S) / ED DIAGNOSES   Final  diagnoses:  Viral illness  COVID     Rx / DC Orders   ED Discharge Orders          Ordered    nirmatrelvir /ritonavir  (PAXLOVID ) 20 x 150 MG & 10 x 100MG  TABS  2 times daily        04/21/24 0135             Note:  This document was prepared using Dragon voice recognition software and may include unintentional dictation errors.    Cyrena Mylar, MD 04/21/24 [phone]

## 2024-04-21 NOTE — Discharge Instructions (Signed)
 Take acetaminophen  650 mg every 6 hours for aches, fever, pain.  Take with food.  Take antiviral COVID medication as prescribed  Thank you for choosing us  for your health care today!  Please see your primary doctor this week for a follow up appointment.   If you have any new, worsening, or unexpected symptoms call your doctor right away or come back to the emergency department for reevaluation.  It was my pleasure to care for you today.   Ginnie EDISON Cyrena, MD

## 2024-04-22 ENCOUNTER — Other Ambulatory Visit: Payer: Self-pay

## 2024-04-23 ENCOUNTER — Emergency Department
Admission: EM | Admit: 2024-04-23 | Discharge: 2024-04-24 | Disposition: A | Discharge status: Home / Self Care | Payer: MEDICAID | Attending: Emergency Medicine | Admitting: Emergency Medicine

## 2024-04-23 ENCOUNTER — Other Ambulatory Visit: Payer: Self-pay

## 2024-04-23 DIAGNOSIS — Y906 Blood alcohol level of 120-199 mg/100 ml: Secondary | ICD-10-CM | POA: Diagnosis not present

## 2024-04-23 DIAGNOSIS — F1410 Cocaine abuse, uncomplicated: Secondary | ICD-10-CM | POA: Diagnosis not present

## 2024-04-23 DIAGNOSIS — F1590 Other stimulant use, unspecified, uncomplicated: Secondary | ICD-10-CM | POA: Insufficient documentation

## 2024-04-23 DIAGNOSIS — F32A Depression, unspecified: Secondary | ICD-10-CM

## 2024-04-23 DIAGNOSIS — F1010 Alcohol abuse, uncomplicated: Secondary | ICD-10-CM | POA: Insufficient documentation

## 2024-04-23 DIAGNOSIS — R45851 Suicidal ideations: Secondary | ICD-10-CM | POA: Diagnosis not present

## 2024-04-23 DIAGNOSIS — F1990 Other psychoactive substance use, unspecified, uncomplicated: Secondary | ICD-10-CM

## 2024-04-23 DIAGNOSIS — F1290 Cannabis use, unspecified, uncomplicated: Secondary | ICD-10-CM | POA: Diagnosis not present

## 2024-04-23 DIAGNOSIS — F331 Major depressive disorder, recurrent, moderate: Secondary | ICD-10-CM | POA: Diagnosis not present

## 2024-04-23 DIAGNOSIS — Z79899 Other long term (current) drug therapy: Secondary | ICD-10-CM | POA: Diagnosis not present

## 2024-04-23 DIAGNOSIS — F1090 Alcohol use, unspecified, uncomplicated: Secondary | ICD-10-CM | POA: Diagnosis present

## 2024-04-23 LAB — COMPREHENSIVE METABOLIC PANEL WITH GFR
ALT: 12 U/L (ref 0–44)
AST: 14 U/L — ABNORMAL LOW (ref 15–41)
Albumin: 3.5 g/dL (ref 3.5–5.0)
Alkaline Phosphatase: 103 U/L (ref 38–126)
Anion gap: 12 (ref 5–15)
BUN: 11 mg/dL (ref 6–20)
CO2: 21 mmol/L — ABNORMAL LOW (ref 22–32)
Calcium: 8.8 mg/dL — ABNORMAL LOW (ref 8.9–10.3)
Chloride: 104 mmol/L (ref 98–111)
Creatinine, Ser: 0.86 mg/dL (ref 0.61–1.24)
GFR, Estimated: >60 mL/min (ref >60)
Glucose, Bld: 117 mg/dL — ABNORMAL HIGH (ref 70–99)
Potassium: 3.9 mmol/L (ref 3.5–5.1)
Sodium: 137 mmol/L (ref 135–145)
Total Bilirubin: 0.8 mg/dL (ref 0.0–1.2)
Total Protein: 7.2 g/dL (ref 6.5–8.1)

## 2024-04-23 LAB — ETHANOL: Alcohol, Ethyl (B): 163 mg/dL — ABNORMAL HIGH (ref <15)

## 2024-04-23 LAB — CBC
HCT: 37.7 % — ABNORMAL LOW (ref 39.0–52.0)
Hemoglobin: 12 g/dL — ABNORMAL LOW (ref 13.0–17.0)
MCH: 29.7 pg (ref 26.0–34.0)
MCHC: 31.8 g/dL (ref 30.0–36.0)
MCV: 93.3 fL (ref 80.0–100.0)
Platelets: 293 K/uL (ref 150–400)
RBC: 4.04 MIL/uL — ABNORMAL LOW (ref 4.22–5.81)
RDW: 15.3 % (ref 11.5–15.5)
WBC: 9.8 K/uL (ref 4.0–10.5)
nRBC: 0.2 % (ref 0.0–0.2)

## 2024-04-23 LAB — URINE DRUG SCREEN, QUALITATIVE (ARMC ONLY)
Amphetamines, Ur Screen: NOT DETECTED
Barbiturates, Ur Screen: NOT DETECTED
Benzodiazepine, Ur Scrn: NOT DETECTED
Cannabinoid 50 Ng, Ur ~~LOC~~: POSITIVE — AB
Cocaine Metabolite,Ur ~~LOC~~: POSITIVE — AB
MDMA (Ecstasy)Ur Screen: NOT DETECTED
Methadone Scn, Ur: NOT DETECTED
Opiate, Ur Screen: NOT DETECTED
Phencyclidine (PCP) Ur S: NOT DETECTED
Tricyclic, Ur Screen: NOT DETECTED

## 2024-04-23 LAB — SALICYLATE LEVEL: Salicylate Lvl: <7 mg/dL — ABNORMAL LOW (ref 7.0–30.0)

## 2024-04-23 LAB — CK: Total CK: 76 U/L (ref 49–397)

## 2024-04-23 LAB — ACETAMINOPHEN LEVEL: Acetaminophen (Tylenol), Serum: <10 ug/mL — ABNORMAL LOW (ref 10–30)

## 2024-04-23 NOTE — ED Triage Notes (Signed)
 Pt reports SI/HI for the past few weeks, pt states he is tired of life. Pt reports drinking a few beers earlier today. Pt calm and cooperative.

## 2024-04-23 NOTE — ED Notes (Signed)
 Pt has 2 bags of belongings, one blue bag and one patient belonging bag.

## 2024-04-23 NOTE — BH Assessment (Signed)
 This Clinical research associate contacted [facility] via phone to request an assessment for this patient, request has been made, assessment is currently pending.

## 2024-04-23 NOTE — ED Notes (Signed)
Provided pillow

## 2024-04-23 NOTE — ED Notes (Signed)
 Pt belongings:  White tshirt [employer] White socks Blue bag with misc. items

## 2024-04-23 NOTE — ED Provider Notes (Signed)
 [facility] Provider Note    Event Date/Time   First MD Initiated Contact with Patient 04/23/24 2001     (approximate)   History   Psychiatric Evaluation   HPI  Alex [name] is a 48 year old male presenting to the emergency department for evaluation of suicidal ideation.  Reports he is tired of life and wishes he would die.  Denies specific plan to harm himself.  Does report alcohol use today.  Additionally notes that he has been in the sun a lot lately and has noticed redness of his skin, fatigue.    Physical Exam   Triage Vital Signs: ED Triage Vitals  Encounter Vitals Group     BP 04/23/24 1927 (!) 132/93     Girls Systolic BP Percentile --      Girls Diastolic BP Percentile --      Boys Systolic BP Percentile --      Boys Diastolic BP Percentile --      Pulse Rate 04/23/24 1927 85     Resp 04/23/24 1927 20     Temp 04/23/24 1927 98.4 F (36.9 C)     Temp src --      SpO2 04/23/24 1927 98 %     Weight 04/23/24 1926 225 lb (102.1 kg)     Height 04/23/24 1926 5' 3 (1.6 m)     Head Circumference --      Peak Flow --      Pain Score 04/23/24 1926 10     Pain Loc --      Pain Education --      Exclude from Growth Chart --     Most recent vital signs: Vitals:   04/23/24 1927  BP: (!) 132/93  Pulse: 85  Resp: 20  Temp: 98.4 F (36.9 C)  SpO2: 98%     General: Awake, interactive  CV:  Regular rate, good peripheral perfusion.  Resp:  Unlabored respirations.  Abd:  Nondistended.  Neuro:  Symmetric facial movement, fluid speech Skin:  Diffuse erythematous skin with some areas of peeling consistent with sunburn   ED Results / Procedures / Treatments   Labs (all labs ordered are listed, but only abnormal results are displayed) Labs Reviewed  COMPREHENSIVE METABOLIC PANEL WITH GFR - Abnormal; Notable for the following components:      Result Value   CO2 21 (*)    Glucose, Bld 117 (*)    Calcium 8.8 (*)    AST 14 (*)    All  other components within normal limits  ETHANOL - Abnormal; Notable for the following components:   Alcohol, Ethyl (B) 163 (*)    All other components within normal limits  CBC - Abnormal; Notable for the following components:   RBC 4.04 (*)    Hemoglobin 12.0 (*)    HCT 37.7 (*)    All other components within normal limits  URINE DRUG SCREEN, QUALITATIVE ([facility] ONLY) - Abnormal; Notable for the following components:   Cocaine Metabolite,Ur [place] POSITIVE (*)    Cannabinoid 50 Ng, Ur [place] POSITIVE (*)    All other components within normal limits  SALICYLATE LEVEL - Abnormal; Notable for the following components:   Salicylate Lvl <7.0 (*)    All other components within normal limits  ACETAMINOPHEN  LEVEL - Abnormal; Notable for the following components:   Acetaminophen  (Tylenol ), Serum <10 (*)    All other components within normal limits  CK     EKG EKG independently reviewed  and interpreted by myself demonstrates:    RADIOLOGY Imaging independently reviewed and interpreted by myself demonstrates:   Formal Radiology Read:  No results found.  PROCEDURES:  Critical Care performed: No  Procedures   MEDICATIONS ORDERED IN ED: Medications - No data to display   IMPRESSION / MDM / ASSESSMENT AND PLAN / ED COURSE  I reviewed the triage vital signs and the nursing notes.  Differential diagnosis includes, but is not limited to, primary psychiatric disorder, substance-induced mood disorder, acute stress response, lower suspicion for consideration for rhabdomyolysis given sun exposure and reported weakness  Patient's presentation is most consistent with acute presentation with potential threat to life or bodily function.  48 year old male presenting to the emergency department for evaluation of passive suicidal ideation.  Does report some recent sun exposure and weakness.  However labs overall reassuring with stable anemia, CMP without significant derangement including normal renal  function.  Normal CK.  EtOH elevated.  UDS positive for cocaine and THC.  Patient denies specific plan to harm himself, do not think there is an indication for IVC.  Will consult psychiatry and TTS.  The patient has been placed in psychiatric observation due to the need to provide a safe environment for the patient while obtaining psychiatric consultation and evaluation, as well as ongoing medical and medication management to treat the patient's condition.  The patient has not been placed under full IVC at this time.       FINAL CLINICAL IMPRESSION(S) / ED DIAGNOSES   Final diagnoses:  Passive suicidal ideations     Rx / DC Orders   ED Discharge Orders     None        Note:  This document was prepared using Dragon voice recognition software and may include unintentional dictation errors.   Levander Slate, MD 04/23/24 [phone]

## 2024-04-23 NOTE — ED Notes (Signed)
 Provided sandwich tray and ice water

## 2024-04-24 ENCOUNTER — Inpatient Hospital Stay (HOSPITAL_COMMUNITY): Admission: AD | Admit: 2024-04-24 | Source: Intra-hospital | Admitting: Psychiatry

## 2024-04-24 DIAGNOSIS — F331 Major depressive disorder, recurrent, moderate: Secondary | ICD-10-CM

## 2024-04-24 LAB — LITHIUM LEVEL: Lithium Lvl: 0.6 mmol/L (ref 0.60–1.20)

## 2024-04-24 NOTE — ED Notes (Signed)
 MD Kodjo advised good for d/c at this time after reassessment. MD Nicholaus made aware.

## 2024-04-24 NOTE — ED Notes (Signed)
 Pt left facility prior to RN being able to obtain d/c vitals. Pt left without receiving d/c instructions.
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
## 2024-04-24 NOTE — ED Notes (Signed)
 RN reached back out to contact number to contact MD Kodjo. Spoke with Alyssa. She stated that she would contact him to respond.

## 2024-04-24 NOTE — ED Notes (Signed)
 Pt given breakfast.

## 2024-04-24 NOTE — Consult Note (Addendum)
 [facility] Consult Note  Patient Name: Alex [name] MRN: [id number] DOB: 05/12/1976 DATE OF Consult: 04/24/2024  PRIMARY PSYCHIATRIC DIAGNOSES  1.  Major depressive disorder recurrent moderate 2.  Stimulant/cocaine use disorder 3.  Alcohol use disorder 4. Cannabis use disorder 5. Rule out substance-induced mood disorder with depressive features   RECOMMENDATIONS  Recommendations: Medication recommendations: Resume home medications Non-Medication/therapeutic recommendations: Supportive care, monitor for alcohol withdrawal symptoms, withdrawal management per primary team Is inpatient psychiatric hospitalization recommended for this patient? Yes (Explain why): Suicidal and homicidal ideation, no plan or intent, no specific target Is another care setting recommended for this patient? (examples may include Crisis Stabilization Unit, Residential/Recovery Treatment, ALF/SNF, Memory Care Unit)  No (Explain why): Suicidal and homicidal ideation, no plan or intent, no specific target From a psychiatric perspective, is this patient appropriate for discharge to an outpatient setting/resource or other less restrictive environment for continued care?  No (Explain why): Suicidal and homicidal ideation, no plan or intent, no specific target Follow-Up Telepsychiatry C/L services: We will sign off for now. Please re-consult our service if needed for any concerning changes in the patient's condition, discharge planning, or questions. Communication: Treatment team members (and family members if applicable) who were involved in treatment/care discussions and planning, and with whom we spoke or engaged with via secure text/chat, include the following: Guadalupe Leontine NOVAK, RN  Thank you for involving us  in the care of this patient. If you have any additional questions or concerns, please call [phone] and ask for me or the provider on-call.  TELEPSYCHIATRY ATTESTATION & CONSENT  As the provider for this  telehealth consult, I attest that I verified the patient's identity using two separate identifiers, introduced myself to the patient, provided my credentials, disclosed my location, and performed this encounter via a HIPAA-compliant, real-time, face-to-face, two-way, interactive audio and video platform and with the full consent and agreement of the patient (or guardian as applicable.)  Patient physical location: [place]. Telehealth provider physical location: home office in state of [place].  Video start time: 0810 AM (Central Time) Video end time: 0835 AM (Central Time)  IDENTIFYING DATA  Alex [name] is a 48 y.o. year-old male for whom a psychiatric consultation has been ordered by the primary provider. The patient was identified using two separate identifiers.  CHIEF COMPLAINT/REASON FOR CONSULT  Suicidal homicidal ideation  HISTORY OF PRESENT ILLNESS (HPI)  Psych consult requested for evaluation of 47 year old male presented to the ED on 7/28 with complaint of suicidal ideation, homicidal ideation, no identified target, has been drinking, UDS positive for cocaine and cannabinoid.  History of depression versus substance-induced mood disorder.  Patient is currently homeless.  During evaluation patient is alert oriented calm cooperative.  Reports he is tired of living because he is going through a lot, does not have a place to stay can't find a job,  does not have money, cannot live with his mother, limited supports.  Endorses suicidal and homicidal ideation, denies plan or intent, no identified target.  Admits to have been drinking, unwilling to state how much, denies any other substance use although UDS positive for cocaine and cannabis.    Currently denies symptoms suggestive of mania anxiety PTSD or psychosis.  Denies withdrawal symptoms.  Patient is evasive at times, not willing to provide information.  Reports a history of 1 suicide attempt as a child, does not want to state in what context,  replies- you asking too many personal questions.  Admits to prior inpatient psychiatric hospitalizations,  unwilling to provide more details.  Endorses compliance with his medication including Celexa  and lithium  trazodone , unable to recall the doses.  PAST PSYCHIATRIC HISTORY   Otherwise as per HPI above.  PAST MEDICAL HISTORY  Past Medical History:  Diagnosis Date   Alcohol abuse    Anxiety    Cocaine abuse (HCC) 05/27/2016   Depression    Seizures (HCC)      HOME MEDICATIONS  PTA Medications  Medication Sig   omeprazole (PRILOSEC) 20 MG capsule Take 20 mg by mouth 2 (two) times daily before a meal.   citalopram  (CELEXA ) 40 MG tablet Take 40 mg by mouth daily.   traZODone  (DESYREL ) 150 MG tablet Take 150 mg by mouth at bedtime.   triamterene -hydrochlorothiazide  (DYAZIDE ) 37.5-25 MG capsule Take 1 capsule by mouth daily.   lithium  carbonate 300 MG capsule Take 300 mg by mouth 2 (two) times daily with a meal.   propranolol  (INDERAL ) 20 MG tablet Take 20 mg by mouth 2 (two) times daily.   acetaminophen  (TYLENOL ) 500 MG tablet Take 2 tablets (1,000 mg total) by mouth every 8 (eight) hours.   meloxicam  (MOBIC ) 15 MG tablet Take 1 tablet (15 mg total) by mouth daily.   ondansetron  (ZOFRAN -ODT) 4 MG disintegrating tablet Take 1 tablet (4 mg total) by mouth every 6 (six) hours as needed for nausea or vomiting.   traMADol  (ULTRAM ) 50 MG tablet Take 1 tablet (50 mg total) by mouth every 6 (six) hours as needed.   nirmatrelvir /ritonavir  (PAXLOVID ) 20 x 150 MG & 10 x 100MG  TABS Take 3 tablets by mouth 2 (two) times daily for 5 days. Patient GFR is wnl. Take nirmatrelvir  (150 mg) two tablets twice daily for 5 days and ritonavir  (100 mg) one tablet twice daily for 5 days.     ALLERGIES  Allergies  Allergen Reactions   Other Itching    Peanut Butter   Peanut Butter Flavoring Agent (Non-Screening)     Other Reaction(s): Unknown    SOCIAL & SUBSTANCE USE HISTORY  Social History    Socioeconomic History   Marital status: Single    Spouse name: Not on file   Number of children: Not on file   Years of education: Not on file   Highest education level: Not on file  Occupational History   Not on file  Tobacco Use   Smoking status: Every Day    Current packs/day: 1.00    Types: Cigarettes, E-cigarettes   Smokeless tobacco: Never  Substance and Sexual Activity   Alcohol use: Yes    Alcohol/week: 4.0 - 5.0 standard drinks of alcohol    Types: 4 - 5 Cans of beer per week   Drug use: Yes    Types: Marijuana, Cocaine   Sexual activity: Not Currently  Other Topics Concern   Not on file  Social History Narrative   Not on file   Social Drivers of Health   Financial Resource Strain: Not on file  Food Insecurity: Patient Declined (09/01/2023)   Hunger Vital Sign    Worried About Running Out of Food in the Last Year: Patient declined    Ran Out of Food in the Last Year: Patient declined  Transportation Needs: Patient Declined (09/01/2023)   PRAPARE - Administrator, Civil Service (Medical): Patient declined    Lack of Transportation (Non-Medical): Patient declined  Physical Activity: Not on file  Stress: Not on file  Social Connections: Not on file   Social History   Tobacco  Use  Smoking Status Every Day   Current packs/day: 1.00   Types: Cigarettes, E-cigarettes  Smokeless Tobacco Never   Social History   Substance and Sexual Activity  Alcohol Use Yes   Alcohol/week: 4.0 - 5.0 standard drinks of alcohol   Types: 4 - 5 Cans of beer per week   Social History   Substance and Sexual Activity  Drug Use Yes   Types: Marijuana, Cocaine    Additional pertinent information .  FAMILY HISTORY  History reviewed. No pertinent family history. Family Psychiatric History (if known):    MENTAL STATUS EXAM (MSE)  Mental Status Exam: General Appearance: hospital gown  Orientation:  Full (Time, Place, and Person)  Memory:  Immediate;    Fair Recent;   Fair Remote;   Fair  Concentration:  Concentration: Fair and Attention Span: Fair  Recall:  Fair  Attention  Fair  Eye Contact:  Fair  Speech:  Normal Rate  Language:  Fair  Volume:  Normal  Mood: depressed  Affect:  Depressed  Thought Process:  Coherent  Thought Content:  Negative  Suicidal Thoughts:  Yes.  without intent/plan  Homicidal Thoughts:  Yes.  without intent/plan  Judgement:  Impaired  Insight:  Fair  Psychomotor Activity:  Normal  Akathisia:  No  Fund of Knowledge:  Fair    Assets:  Communication Skills  Cognition:  WNL  ADL's:  Intact  AIMS (if indicated):       VITALS  Blood pressure (!) 132/93, pulse 85, temperature 98.4 F (36.9 C), resp. rate 20, height 5' 3 (1.6 m), weight 102.1 kg, SpO2 98%.  LABS  Admission on 04/23/2024  Component Date Value Ref Range Status   Sodium 04/23/2024 137  135 - 145 mmol/L Final   Potassium 04/23/2024 3.9  3.5 - 5.1 mmol/L Final   Chloride 04/23/2024 104  98 - 111 mmol/L Final   CO2 04/23/2024 21 (L)  22 - 32 mmol/L Final   Glucose, Bld 04/23/2024 117 (H)  70 - 99 mg/dL Final   Glucose reference range applies only to samples taken after fasting for at least 8 hours.   BUN 04/23/2024 11  6 - 20 mg/dL Final   Creatinine, Ser 04/23/2024 0.86  0.61 - 1.24 mg/dL Final   Calcium 92/71/7974 8.8 (L)  8.9 - 10.3 mg/dL Final   Total Protein 92/71/7974 7.2  6.5 - 8.1 g/dL Final   Albumin 92/71/7974 3.5  3.5 - 5.0 g/dL Final   AST 92/71/7974 14 (L)  15 - 41 U/L Final   ALT 04/23/2024 12  0 - 44 U/L Final   Alkaline Phosphatase 04/23/2024 103  38 - 126 U/L Final   Total Bilirubin 04/23/2024 0.8  0.0 - 1.2 mg/dL Final   GFR, Estimated 04/23/2024 >60  >60 mL/min Final   Comment: (NOTE) Calculated using the CKD-EPI Creatinine Equation (2021)    Anion gap 04/23/2024 12  5 - 15 Final   Performed at [facility], [address] [place], [place] [address]   Alcohol, Ethyl (B) 04/23/2024 163 (H)  <15 mg/dL  Final   Comment: (NOTE) For medical purposes only. Performed at [facility], [address] [place], [place] [address]    WBC 04/23/2024 9.8  4.0 - 10.5 K/uL Final   RBC 04/23/2024 4.04 (L)  4.22 - 5.81 MIL/uL Final   Hemoglobin 04/23/2024 12.0 (L)  13.0 - 17.0 g/dL Final   HCT 92/71/7974 37.7 (L)  39.0 - 52.0 % Final  MCV 04/23/2024 93.3  80.0 - 100.0 fL Final   MCH 04/23/2024 29.7  26.0 - 34.0 pg Final   MCHC 04/23/2024 31.8  30.0 - 36.0 g/dL Final   RDW 92/71/7974 15.3  11.5 - 15.5 % Final   Platelets 04/23/2024 293  150 - 400 K/uL Final   nRBC 04/23/2024 0.2  0.0 - 0.2 % Final   Performed at [facility], [address] [place], [place] [address]   Tricyclic, Ur Screen 04/23/2024 NONE DETECTED  NONE DETECTED Final   Amphetamines, Ur Screen 04/23/2024 NONE DETECTED  NONE DETECTED Final   MDMA (Ecstasy)Ur Screen 04/23/2024 NONE DETECTED  NONE DETECTED Final   Cocaine Metabolite,Ur [place] 04/23/2024 POSITIVE (A)  NONE DETECTED Final   Opiate, Ur Screen 04/23/2024 NONE DETECTED  NONE DETECTED Final   Phencyclidine (PCP) Ur S 04/23/2024 NONE DETECTED  NONE DETECTED Final   Cannabinoid 50 Ng, Ur [place] 04/23/2024 POSITIVE (A)  NONE DETECTED Final   Barbiturates, Ur Screen 04/23/2024 NONE DETECTED  NONE DETECTED Final   Benzodiazepine, Ur Scrn 04/23/2024 NONE DETECTED  NONE DETECTED Final   Methadone Scn, Ur 04/23/2024 NONE DETECTED  NONE DETECTED Final   Comment: (NOTE) Tricyclics + metabolites, urine    Cutoff 1000 ng/mL Amphetamines + metabolites, urine  Cutoff 1000 ng/mL MDMA (Ecstasy), urine              Cutoff 500 ng/mL Cocaine Metabolite, urine          Cutoff 300 ng/mL Opiate + metabolites, urine        Cutoff 300 ng/mL Phencyclidine (PCP), urine         Cutoff 25 ng/mL Cannabinoid, urine                 Cutoff 50 ng/mL Barbiturates + metabolites, urine  Cutoff 200 ng/mL Benzodiazepine, urine              Cutoff 200 ng/mL Methadone, urine                    Cutoff 300 ng/mL  The urine drug screen provides only a preliminary, unconfirmed analytical test result and should not be used for non-medical purposes. Clinical consideration and professional judgment should be applied to any positive drug screen result due to possible interfering substances. A more specific alternate chemical method must be used in order to obtain a confirmed analytical result. Gas chromatography / mass spectrometry (GC/MS) is the preferred confirm                          atory method. Performed at [facility], [address] [place], [place] [address]    Salicylate Lvl 04/23/2024 <7.0 (L)  7.0 - 30.0 mg/dL Final   Performed at [facility], [address] [place], [place] [address]   Acetaminophen  (Tylenol ), Serum 04/23/2024 <10 (L)  10 - 30 ug/mL Final   Comment: (NOTE) Therapeutic concentrations vary significantly. A range of 10-30 ug/mL  may be an effective concentration for many patients. However, some  are best treated at concentrations outside of this range. Acetaminophen  concentrations >150 ug/mL at 4 hours after ingestion  and >50 ug/mL at 12 hours after ingestion are often associated with  toxic reactions.  Performed at [facility], [address] [place], [place] [address]    Total CK 04/23/2024 76  49 - 397 U/L Final   Performed at [facility], [address]  [place], [place] [address]   Lithium  Lvl 04/23/2024 0.60  0.60 - 1.20 mmol/L Final   Performed at [facility], [address] [place], [place] [address]    PSYCHIATRIC REVIEW OF SYSTEMS (ROS)  ROS: Notable for the following relevant positive findings: ROS  Additional findings:      Musculoskeletal: No abnormal movements observed      Gait & Station: Laying/Sitting      Pain Screening: Denies      Nutrition & Dental Concerns: none  RISK FORMULATION/ASSESSMENT  Is the patient experiencing any suicidal or homicidal ideations: Yes        Explain if yes: see HPI Protective factors considered for safety management: In hospital, constant observation  Risk factors/concerns considered for safety management:  Prior attempt Depression Substance abuse/dependence Hopelessness Isolation Male gender Unmarried  Is there a safety management plan with the patient and treatment team to minimize risk factors and promote protective factors: Yes           Explain: Inpatient psychiatric stabilization Is crisis care placement or psychiatric hospitalization recommended: Yes     Based on my current evaluation and risk assessment, patient is determined at this time to be at:  Moderate Risk  *RISK ASSESSMENT Risk assessment is a dynamic process; it is possible that this patient's condition, and risk level, may change. This should be re-evaluated and managed over time as appropriate. Please re-consult psychiatric consult services if additional assistance is needed in terms of risk assessment and management. If your team decides to discharge this patient, please advise the patient how to best access emergency psychiatric services, or to call 911, if their condition worsens or they feel unsafe in any way.   Vella Colquitt D Shifa Brisbon, MD Telepsychiatry Consult Services

## 2024-04-24 NOTE — Discharge Instructions (Addendum)
 You were seen in the emergency department for major depressive disorder.  It was recommended that you go to an inpatient behavioral health center but you have chosen not to.  Please follow-up with with your primary care physician and outpatient resources as soon as possible return if any acutely worsening symptoms -- RETURN PRECAUTIONS & AFTERCARE: (ENGLISH) RETURN PRECAUTIONS: Return immediately to the emergency department or see/call your doctor if you feel worse, weak or have changes in speech or vision, are short of breath, have fever, vomiting, pain, bleeding or dark stool, trouble urinating or any new issues. Return here or see/call your doctor if not improving as expected for your suspected condition. FOLLOW-UP CARE: Call your doctor and/or any doctors we referred you to for more advice and to make an appointment. Do this today, tomorrow or after the weekend. Some doctors only take PPO insurance so if you have HMO insurance you may want to contact your HMO or your regular doctor for referral to a specialist within your plan. Either way tell the doctor's office that it was a referral from the emergency department so you get the soonest possible appointment.  YOUR TEST RESULTS: Take result reports of any blood or urine tests, imaging tests and EKG's to your doctor and any referral doctor. Have any abnormal tests repeated. Your doctor or a referral doctor can let you know when this should be done. Also make sure your doctor contacts this hospital to get any test results that are not currently available such as cultures or special tests for infection and final imaging reports, which are often not available at the time you leave the ER but which may list additional important findings that are not documented on the preliminary report. BLOOD PRESSURE: If your blood pressure was greater than 120/80 have your blood pressure rechecked within 1 to 2 weeks. MEDICATION SIDE EFFECTS: Do not drive, walk, bike, take  the bus, etc. if you have received or are being prescribed any sedating medications such as those for pain or anxiety or certain antihistamines like Benadryl . If you have been give one of these here get a taxi home or have a friend drive you home. Ask your pharmacist to counsel you on potential side effects of any new medication

## 2024-04-24 NOTE — ED Notes (Signed)
 RN still has no response from MD Kodjo.
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
## 2024-04-24 NOTE — ED Provider Notes (Signed)
 Emergency Medicine Observation Re-evaluation Note  Alex [name] is a 48 y.o. male, seen on rounds today.  Pt initially presented to the ED for complaints of Psychiatric Evaluation  Currently, the patient is is no acute distress. Denies any concerns at this time.  Physical Exam  Blood pressure (!) 132/93, pulse 85, temperature 98.4 F (36.9 C), resp. rate 20, height 5' 3 (1.6 m), weight 102.1 kg, SpO2 98%.  Physical Exam: General: No apparent distress Pulm: Normal WOB Neuro: Moving all extremities Psych: Resting comfortably     ED Course / MDM     I have reviewed the labs performed to date as well as medications administered while in observation.  Recent changes in the last 24 hours include: No acute events overnight.  Plan   Current plan: Patient awaiting psychiatric disposition.    Anwar Crill, Josette SAILOR, DO 04/24/24 [phone]

## 2024-04-24 NOTE — ED Provider Notes (Signed)
 Clinical Course as of 04/24/24 1242  Tue Apr 24, 2024  1227 I was called to bedside as I was notified that patient wanted to leave.  He was recommended inpatient hospitalization by psychiatry but to remain voluntary.  Patient denies any SI HI on my exam.  Denies any AVH S.  States that he has a plan to live with his mother.  We have attempted to reach out to psychiatry on multiple occasions (both nurse and I) without response.  Patient is willing to stay till 1:10 PM.  I have counseled him regarding the indications benefits risks and alternatives of inpatient hospitalization and he has voiced understanding but states that he does not want to go to [facility] by any means [HD]  1231 Was called back to reassess the patient as patient is now adamant that he wants to go home.  With nurse at bedside, patient denies any SI HI or AVH stressed again.  States that he has at home to go home to.  Does not want to stay.  He is alert and oriented x 3 and he and I do think he has the capacity to make this decision.  I would not hold him here against as will.  Unfortunately we have not heard back from the psychiatry team at this point [HD]  1242 Dr. Kojo was able to talk to the patient electronically and agrees with discharge with outpatient resources [HD]    Clinical Course User Index [HD] Nicholaus Rolland BRAVO, MD      Nicholaus Rolland BRAVO, MD 04/24/24 1242

## 2024-04-24 NOTE — ED Notes (Signed)
 Pt given lunch at this time.

## 2024-04-24 NOTE — Consult Note (Signed)
 Alex [name] Follow-Up Consult Note  Patient Name: Alex [name] MRN: [id number] DOB: [birth year] DATE OF Consult: 04/24/2024  PRIMARY PSYCHIATRIC DIAGNOSES  1.  Other depressive episode 2.  Rule out malingering  RECOMMENDATIONS  Please see initial Psychiatric Consult Note for full assessment and initial treatment recommendations. Additional recommendations:  Recommendations: Medication recommendations: no med changes Non-Medication/therapeutic recommendations: Supportive care Is inpatient psychiatric hospitalization recommended for this patient? No (Explain why): Denies suicidal homicidal ideation, not psychotic Is another care setting recommended for this patient? (examples may include Crisis Stabilization Unit, Residential/Recovery Treatment, ALF/SNF, Memory Care Unit)  No (Explain why): Denies suicidal homicidal ideation, not psychotic From a psychiatric perspective, is this patient appropriate for discharge to an outpatient setting/resource or other less restrictive environment for continued care?  Yes (Explain why): Denies suicidal homicidal ideation, not psychotic Follow-Up [name] C/L services: We will sign off for now. Please re-consult our service if needed for any concerning changes in the patient's condition, discharge planning, or questions. Communication: Treatment team members (and family members if applicable) who were involved in treatment/care discussions and planning, and with whom we spoke or engaged with via secure text/chat, include the following: Alex Moats, MD  Thank you for involving us  in the care of this patient. If you have any additional questions or concerns, please call [phone] and ask for me or the provider on-call.  [name] ATTESTATION & CONSENT  As the provider for this telehealth consult, I attest that I verified the patient's identity using two separate identifiers, introduced myself to the patient, provided my credentials,  disclosed my location, and performed this encounter via a HIPAA-compliant, real-time, face-to-face, two-way, interactive audio and video platform and with the full consent and agreement of the patient (or guardian as applicable.)  Patient physical location: [facility] Alex [name]). Telehealth provider physical location: home office in state of [place].  Video start time: 1130 AM (Central Time) Video end time: 1140 AM (Central Time)  IDENTIFYING DATA  Alex [name] is a 48 y.o. year-old male for whom a psychiatric consultation has been ordered by the primary provider. The patient was identified using two separate identifiers.  CHIEF COMPLAINT/REASON FOR CONSULT  Passive suicidal and homicidal thoughts, no identified target.  INTERVAL HISTORY  Please refer to initial consult note for full history, assessment, and treatment. Patient seen in follow-up, requests to leave, reports he is feeling better.  Is alert calm cooperative and pleasant.  Denies any psychiatric complaints.  Denies suicide homicidal ideation or thought of self-harm.   PERTINENT UPDATES TO PMH, SH/SUDH, FH  No interim changes  HOME MEDICATIONS  No current facility-administered medications for this encounter.  Current Outpatient Medications:    acetaminophen  (TYLENOL ) 500 MG tablet, Take 2 tablets (1,000 mg total) by mouth every 8 (eight) hours., Disp: 90 tablet, Rfl: 2   citalopram  (CELEXA ) 40 MG tablet, Take 40 mg by mouth daily., Disp: , Rfl:    lithium  carbonate 300 MG capsule, Take 300 mg by mouth 2 (two) times daily with a meal., Disp: , Rfl:    omeprazole (PRILOSEC) 20 MG capsule, Take 20 mg by mouth 2 (two) times daily before a meal., Disp: , Rfl:    ondansetron  (ZOFRAN -ODT) 4 MG disintegrating tablet, Take 1 tablet (4 mg total) by mouth every 6 (six) hours as needed for nausea or vomiting., Disp: 20 tablet, Rfl: 0   promethazine  (PHENERGAN ) 25 MG tablet, Take 25 mg by mouth daily as needed., Disp: , Rfl:    propranolol   (INDERAL ) 20  MG tablet, Take 20 mg by mouth 2 (two) times daily., Disp: , Rfl:    traZODone  (DESYREL ) 150 MG tablet, Take 150 mg by mouth at bedtime., Disp: , Rfl:    triamterene -hydrochlorothiazide  (DYAZIDE ) 37.5-25 MG capsule, Take 1 capsule by mouth daily., Disp: , Rfl:    meloxicam  (MOBIC ) 15 MG tablet, Take 1 tablet (15 mg total) by mouth daily. (Patient not taking: Reported on 04/24/2024), Disp: 30 tablet, Rfl: 2   nirmatrelvir /ritonavir  (PAXLOVID ) 20 x 150 MG & 10 x 100MG  TABS, Take 3 tablets by mouth 2 (two) times daily for 5 days. Patient GFR is wnl. Take nirmatrelvir  (150 mg) two tablets twice daily for 5 days and ritonavir  (100 mg) one tablet twice daily for 5 days., Disp: 30 tablet, Rfl: 0   traMADol  (ULTRAM ) 50 MG tablet, Take 1 tablet (50 mg total) by mouth every 6 (six) hours as needed. (Patient not taking: Reported on 04/24/2024), Disp: 10 tablet, Rfl: 0   valproic  acid (DEPAKENE ) 250 MG capsule, Take 3 capsules (750 mg total) by mouth at bedtime., Disp: 90 capsule, Rfl: 2   ALLERGIES  Allergies  Allergen Reactions   Other Itching    Peanut Butter   Peanut Butter Flavoring Agent (Non-Screening)     Other Reaction(s): Unknown    CURRENT MENTAL STATUS EXAM (MSE)  Mental Status Exam: General Appearance: Casual  Orientation:  Full (Time, Place, and Person)  Memory:  Immediate;   Fair Recent;   Fair Remote;   Fair  Concentration:  Concentration: Fair and Attention Span: Fair  Recall:  Fair  Attention  Fair  Eye Contact:  Fair  Speech:  Clear and Coherent  Language:  Fair  Volume:  Normal  Mood:  I am ok now  Affect:  Congruent  Thought Process:  Coherent  Thought Content:  Negative  Suicidal Thoughts:  No  Homicidal Thoughts:  No  Judgement:  Fair  Insight:  Fair  Psychomotor Activity:  Negative  Akathisia:  No  Fund of Knowledge:  Fair    Assets:  Communication Skills  Cognition:  WNL  ADL's:  Intact  AIMS (if indicated):       VITALS  Blood pressure  127/87, pulse 73, temperature 97.8 F (36.6 C), temperature source Oral, resp. rate 18, height 5' 3 (1.6 m), weight 102.1 kg, SpO2 97%.  LABS  Admission on 04/23/2024, Discharged on 04/24/2024  Component Date Value Ref Range Status   Sodium 04/23/2024 137  135 - 145 mmol/L Final   Potassium 04/23/2024 3.9  3.5 - 5.1 mmol/L Final   Chloride 04/23/2024 104  98 - 111 mmol/L Final   CO2 04/23/2024 21 (L)  22 - 32 mmol/L Final   Glucose, Bld 04/23/2024 117 (H)  70 - 99 mg/dL Final   Glucose reference range applies only to samples taken after fasting for at least 8 hours.   BUN 04/23/2024 11  6 - 20 mg/dL Final   Creatinine, Ser 04/23/2024 0.86  0.61 - 1.24 mg/dL Final   Calcium 92/71/7974 8.8 (L)  8.9 - 10.3 mg/dL Final   Total Protein 92/71/7974 7.2  6.5 - 8.1 g/dL Final   Albumin 92/71/7974 3.5  3.5 - 5.0 g/dL Final   AST 92/71/7974 14 (L)  15 - 41 U/L Final   ALT 04/23/2024 12  0 - 44 U/L Final   Alkaline Phosphatase 04/23/2024 103  38 - 126 U/L Final   Total Bilirubin 04/23/2024 0.8  0.0 - 1.2 mg/dL Final   GFR,  Estimated 04/23/2024 >60  >60 mL/min Final   Comment: (NOTE) Calculated using the CKD-EPI Creatinine Equation (2021)    Anion gap 04/23/2024 12  5 - 15 Final   Performed at [facility], [address] [place], [place] [address]   Alcohol, Ethyl (B) 04/23/2024 163 (H)  <15 mg/dL Final   Comment: (NOTE) For medical purposes only. Performed at [facility], [address] [place], [place] [address]    WBC 04/23/2024 9.8  4.0 - 10.5 K/uL Final   RBC 04/23/2024 4.04 (L)  4.22 - 5.81 MIL/uL Final   Hemoglobin 04/23/2024 12.0 (L)  13.0 - 17.0 g/dL Final   HCT 92/71/7974 37.7 (L)  39.0 - 52.0 % Final   MCV 04/23/2024 93.3  80.0 - 100.0 fL Final   MCH 04/23/2024 29.7  26.0 - 34.0 pg Final   MCHC 04/23/2024 31.8  30.0 - 36.0 g/dL Final   RDW 92/71/7974 15.3  11.5 - 15.5 % Final   Platelets 04/23/2024 293  150 - 400 K/uL Final   nRBC 04/23/2024 0.2  0.0 -  0.2 % Final   Performed at [facility], [address] [place], [place] [address]   Tricyclic, Ur Screen 04/23/2024 NONE DETECTED  NONE DETECTED Final   Amphetamines, Ur Screen 04/23/2024 NONE DETECTED  NONE DETECTED Final   MDMA (Ecstasy)Ur Screen 04/23/2024 NONE DETECTED  NONE DETECTED Final   Cocaine Metabolite,Ur [place] 04/23/2024 POSITIVE (A)  NONE DETECTED Final   Opiate, Ur Screen 04/23/2024 NONE DETECTED  NONE DETECTED Final   Phencyclidine (PCP) Ur S 04/23/2024 NONE DETECTED  NONE DETECTED Final   Cannabinoid 50 Ng, Ur [place] 04/23/2024 POSITIVE (A)  NONE DETECTED Final   Barbiturates, Ur Screen 04/23/2024 NONE DETECTED  NONE DETECTED Final   Benzodiazepine, Ur Scrn 04/23/2024 NONE DETECTED  NONE DETECTED Final   Methadone Scn, Ur 04/23/2024 NONE DETECTED  NONE DETECTED Final   Comment: (NOTE) Tricyclics + metabolites, urine    Cutoff 1000 ng/mL Amphetamines + metabolites, urine  Cutoff 1000 ng/mL MDMA (Ecstasy), urine              Cutoff 500 ng/mL Cocaine Metabolite, urine          Cutoff 300 ng/mL Opiate + metabolites, urine        Cutoff 300 ng/mL Phencyclidine (PCP), urine         Cutoff 25 ng/mL Cannabinoid, urine                 Cutoff 50 ng/mL Barbiturates + metabolites, urine  Cutoff 200 ng/mL Benzodiazepine, urine              Cutoff 200 ng/mL Methadone, urine                   Cutoff 300 ng/mL  The urine drug screen provides only a preliminary, unconfirmed analytical test result and should not be used for non-medical purposes. Clinical consideration and professional judgment should be applied to any positive drug screen result due to possible interfering substances. A more specific alternate chemical method must be used in order to obtain a confirmed analytical result. Gas chromatography / mass spectrometry (GC/MS) is the preferred confirm                          atory method. Performed at [facility], [address] [place], [place] [address]     Salicylate Lvl 04/23/2024 <7.0 (L)  7.0 -  30.0 mg/dL Final   Performed at [facility], [address] [place], [place] [address]   Acetaminophen  (Tylenol ), Serum 04/23/2024 <10 (L)  10 - 30 ug/mL Final   Comment: (NOTE) Therapeutic concentrations vary significantly. A range of 10-30 ug/mL  may be an effective concentration for many patients. However, some  are best treated at concentrations outside of this range. Acetaminophen  concentrations >150 ug/mL at 4 hours after ingestion  and >50 ug/mL at 12 hours after ingestion are often associated with  toxic reactions.  Performed at [facility], [address] [place], [place] [address]    Total CK 04/23/2024 76  49 - 397 U/L Final   Performed at [facility], [address] [place], [place] [address]   Lithium  Lvl 04/23/2024 0.60  0.60 - 1.20 mmol/L Final   Performed at [facility], [address] [place], [place] [address]    PSYCHIATRIC REVIEW OF SYSTEMS (ROS)  ROS: Notable for the following relevant positive findings: ROS  Additional findings:      Musculoskeletal: No abnormal movements observed      Gait & Station: Laying/Sitting  RISK FORMULATION/ASSESSMENT  Is the patient experiencing any suicidal or homicidal ideations: No  Protective factors considered for safety management: Engaging in treatment, medication compliance  Risk factors/concerns considered for safety management:  Prior attempt Depression Substance abuse/dependence Male gender Unmarried  Is there a Astronomer plan with the patient and treatment team to minimize risk factors and promote protective factors: No           Explain: Outpatient management Is crisis care placement or psychiatric hospitalization recommended: No     Based on my current evaluation and risk assessment, patient is determined at this time to be at:  Low risk  *RISK ASSESSMENT Risk assessment is a dynamic process; it is possible  that this patient's condition, and risk level, may change. This should be re-evaluated and managed over time as appropriate. Please re-consult psychiatric consult services if additional assistance is needed in terms of risk assessment and management. If your team decides to discharge this patient, please advise the patient how to best access emergency psychiatric services, or to call 911, if their condition worsens or they feel unsafe in any way.   Allyana Vogan D Eustacia Urbanek, MD [name]

## 2024-04-24 NOTE — ED Notes (Signed)
vol/psych consult ordered/pending.. 

## 2024-04-24 NOTE — ED Notes (Signed)
 Per Alex [name] patient accepted to [facility]. Med 400-2  Dr. Zouev accepting  MDD diagnosis .

## 2024-04-24 NOTE — ED Notes (Signed)
 MD Nicholaus made aware of pt wanting to be d/c. MD to bedside to assess pt. Pt agreeable to stay until psych MD responds.

## 2024-04-27 ENCOUNTER — Other Ambulatory Visit: Payer: Self-pay

## 2024-05-01 ENCOUNTER — Emergency Department
Admission: EM | Admit: 2024-05-01 | Discharge: 2024-05-01 | Disposition: A | Discharge status: Home / Self Care | Payer: MEDICAID | Attending: Emergency Medicine | Admitting: Emergency Medicine

## 2024-05-01 ENCOUNTER — Emergency Department
Admission: EM | Admit: 2024-05-01 | Discharge: 2024-05-02 | Disposition: A | Discharge status: Home / Self Care | Payer: MEDICAID | Attending: Emergency Medicine | Admitting: Emergency Medicine

## 2024-05-01 ENCOUNTER — Other Ambulatory Visit: Payer: Self-pay

## 2024-05-01 DIAGNOSIS — F1910 Other psychoactive substance abuse, uncomplicated: Secondary | ICD-10-CM | POA: Insufficient documentation

## 2024-05-01 DIAGNOSIS — F10129 Alcohol abuse with intoxication, unspecified: Secondary | ICD-10-CM | POA: Diagnosis present

## 2024-05-01 DIAGNOSIS — Y909 Presence of alcohol in blood, level not specified: Secondary | ICD-10-CM | POA: Diagnosis not present

## 2024-05-01 DIAGNOSIS — R4781 Slurred speech: Secondary | ICD-10-CM | POA: Insufficient documentation

## 2024-05-01 DIAGNOSIS — Y906 Blood alcohol level of 120-199 mg/100 ml: Secondary | ICD-10-CM | POA: Insufficient documentation

## 2024-05-01 DIAGNOSIS — F10920 Alcohol use, unspecified with intoxication, uncomplicated: Secondary | ICD-10-CM

## 2024-05-01 DIAGNOSIS — R519 Headache, unspecified: Secondary | ICD-10-CM | POA: Insufficient documentation

## 2024-05-01 MED ORDER — ONDANSETRON 4 MG PO TBDP: 4.0000 mg | ORAL_TABLET | Freq: Once | ORAL | Status: DC

## 2024-05-01 NOTE — ED Notes (Signed)
 Pt resting in bed with eyes closed. Pt is well appearing with RR even and unlabored.

## 2024-05-01 NOTE — ED Provider Notes (Signed)
 Patient is clinically sober and appropriate for discharge   Arlander Charleston, MD 05/01/24 1332

## 2024-05-01 NOTE — ED Notes (Signed)
 Assumed care of pt. Report received from previous RN. Pt alert and oriented. Pt RR even and unlabored. Pt denies pain at this time. Pt calm and cooperative. Pt denies any needs at this time.

## 2024-05-01 NOTE — ED Provider Notes (Signed)
 [facility] Provider Note    Event Date/Time   First MD Initiated Contact with Patient 05/01/24 0116     (approximate)   History   Headache   HPI  Alex [name] is a 48 y.o. male with a history of major depressive disorder who presents with alcohol intoxication.  EMS reported to triage that the patient has been complaining of a headache.  He was discharged from alcohol rehab yesterday morning.  However during my evaluation, the patient was only able to tell me that he had a lot of stuff going on but unable to identify any specific symptoms or why he was in the ED.  I reviewed the past medical records.  The patient was seen by psychiatry on 7/29 for SI and was initially recommended for inpatient psychiatric admission but eventually cleared and discharged.   Physical Exam   Triage Vital Signs: ED Triage Vitals  Encounter Vitals Group     BP 05/01/24 0035 100/61     Girls Systolic BP Percentile --      Girls Diastolic BP Percentile --      Boys Systolic BP Percentile --      Boys Diastolic BP Percentile --      Pulse Rate 05/01/24 0035 100     Resp 05/01/24 0035 20     Temp 05/01/24 0035 98.6 F (37 C)     Temp Source 05/01/24 0035 Oral     SpO2 05/01/24 0035 96 %     Weight --      Height --      Head Circumference --      Peak Flow --      Pain Score 05/01/24 0109 8     Pain Loc --      Pain Education --      Exclude from Growth Chart --     Most recent vital signs: Vitals:   05/01/24 0035  BP: 100/61  Pulse: 100  Resp: 20  Temp: 98.6 F (37 C)  SpO2: 96%     General: Somnolent but arousable, intoxicated appearing, no distress.  CV:  Good peripheral perfusion.  Resp:  Normal effort.  Abd:  No distention.  Other:  EOMI.  PERRLA.  No facial droop.  Slurred speech.  Motor intact in all extremities.  Intoxicated appearing.   ED Results / Procedures / Treatments   Labs (all labs ordered are listed, but only abnormal results  are displayed) Labs Reviewed - No data to display   EKG    RADIOLOGY    PROCEDURES:  Critical Care performed: No  Procedures   MEDICATIONS ORDERED IN ED: Medications  ondansetron  (ZOFRAN -ODT) disintegrating tablet 4 mg (has no administration in time range)     IMPRESSION / MDM / ASSESSMENT AND PLAN / ED COURSE  I reviewed the triage vital signs and the nursing notes.  48 year old male with PMH as noted above presents with alcohol intoxication.  He reported headache to EMS but is unable to give me any history or identify any specific complaints.  On exam he is arousable has a nonfocal neurologic exam.  He appears intoxicated and endorsed alcohol use in triage.  Differential diagnosis includes, but is not limited to, alcohol intoxication, other substance abuse.  There is no evidence of trauma.  At this time, there is no indication for imaging or lab workup.  We will observe the patient to sobriety and then reevaluate and determine if he needs any other workup.  Patient's presentation is most consistent with acute, uncomplicated illness.  ----------------------------------------- 6:30 AM on 05/01/2024 -----------------------------------------  The patient is more arousable but still appears intoxicated.  We will continue to observe.  He will be signed out to the oncoming ED physician at 7 AM.  FINAL CLINICAL IMPRESSION(S) / ED DIAGNOSES   Final diagnoses:  Alcoholic intoxication without complication (HCC)     Rx / DC Orders   ED Discharge Orders     None        Note:  This document was prepared using Dragon voice recognition software and may include unintentional dictation errors.    Jacolyn Pae, MD 05/01/24 0630

## 2024-05-01 NOTE — ED Triage Notes (Addendum)
 Pt arrives from home via [facility] with reports of headache. Was discharged from alcohol rehab yesterday morning. Denies chest pain, SOB. Pt reports drinking 2 40's since discharge from rehab. Denies drug use

## 2024-05-01 NOTE — ED Notes (Signed)
 Pt reports episode of vomiting while in bathroom. This rn obtained order for odt zofran , but patient now appears asleep with snoring respirations

## 2024-05-01 NOTE — ED Notes (Addendum)
 Pt discharged at this time. RN reviewed discharge instructions with pt. Pt verbalized understanding. Vital signs taken. RR even and unlabored. Pt denies any questions or needs at this time. Pt ambulatory at discharge. All belongings give to pt at this time. Pt states he is taking bus home.

## 2024-05-01 NOTE — ED Notes (Signed)
 Rounded on pt at this time. Pt is well appearing. RR even and unlabored. Pt is calm and cooperative. Pt denies no other needs at this time.

## 2024-05-02 ENCOUNTER — Other Ambulatory Visit: Payer: Self-pay

## 2024-05-02 LAB — CBC WITH DIFFERENTIAL/PLATELET
Abs Immature Granulocytes: 0.04 K/uL (ref 0.00–0.07)
Basophils Absolute: 0 K/uL (ref 0.0–0.1)
Basophils Relative: 0 %
Eosinophils Absolute: 0.4 K/uL (ref 0.0–0.5)
Eosinophils Relative: 5 %
HCT: 38.3 % — ABNORMAL LOW (ref 39.0–52.0)
Hemoglobin: 11.8 g/dL — ABNORMAL LOW (ref 13.0–17.0)
Immature Granulocytes: 1 %
Lymphocytes Relative: 23 %
Lymphs Abs: 1.7 K/uL (ref 0.7–4.0)
MCH: 30.4 pg (ref 26.0–34.0)
MCHC: 30.8 g/dL (ref 30.0–36.0)
MCV: 98.7 fL (ref 80.0–100.0)
Monocytes Absolute: 1 K/uL (ref 0.1–1.0)
Monocytes Relative: 14 %
Neutro Abs: 4.2 K/uL (ref 1.7–7.7)
Neutrophils Relative %: 57 %
Platelets: 210 K/uL (ref 150–400)
RBC: 3.88 MIL/uL — ABNORMAL LOW (ref 4.22–5.81)
RDW: 15.3 % (ref 11.5–15.5)
WBC: 7.4 K/uL (ref 4.0–10.5)
nRBC: 0 % (ref 0.0–0.2)

## 2024-05-02 LAB — COMPREHENSIVE METABOLIC PANEL WITH GFR
ALT: 13 U/L (ref 0–44)
AST: 19 U/L (ref 15–41)
Albumin: 3.2 g/dL — ABNORMAL LOW (ref 3.5–5.0)
Alkaline Phosphatase: 88 U/L (ref 38–126)
Anion gap: 9 (ref 5–15)
BUN: 10 mg/dL (ref 6–20)
CO2: 23 mmol/L (ref 22–32)
Calcium: 8.5 mg/dL — ABNORMAL LOW (ref 8.9–10.3)
Chloride: 102 mmol/L (ref 98–111)
Creatinine, Ser: 0.84 mg/dL (ref 0.61–1.24)
GFR, Estimated: >60 mL/min (ref >60)
Glucose, Bld: 102 mg/dL — ABNORMAL HIGH (ref 70–99)
Potassium: 3.9 mmol/L (ref 3.5–5.1)
Sodium: 134 mmol/L — ABNORMAL LOW (ref 135–145)
Total Bilirubin: 0.5 mg/dL (ref 0.0–1.2)
Total Protein: 6.7 g/dL (ref 6.5–8.1)

## 2024-05-02 LAB — ETHANOL: Alcohol, Ethyl (B): 137 mg/dL — ABNORMAL HIGH (ref <15)

## 2024-05-02 NOTE — ED Notes (Signed)
 Pt has been discharged, pt ambulated out of ER at own will prior to receiving discharge paperwork and resources.

## 2024-05-02 NOTE — ED Notes (Signed)
 Patient continues to ask for a sandwich. Staff has provided patient with several graham crackers and peanut butter, we have not sandwiches available. Pt asking to speak with provider, provider to bedside with this RN. Patient asking to be admitted to behavioral health unit, pt denies SI/HI. Dr. Neomi offered resources for alcohol rehab but patient states he doesn't want to be discharged because it is raining. Patient malingering at this time, pt clinically sober.  This RN stepped off floor briefly and when returned, patient had left.

## 2024-05-02 NOTE — ED Provider Notes (Signed)
 [facility] Provider Note    Event Date/Time   First MD Initiated Contact with Patient 05/01/24 2346     (approximate)   History   Alcohol Intoxication   HPI  Alex [name] is a 48 y.o. male with history of polysubstance abuse, depression, seizures who presents to the emergency department intoxicated.  Patient was found intoxicated by PD and they called EMS.  Patient has no acute complaints.  States he is hungry.   History provided by patient, EMS.    Past Medical History:  Diagnosis Date   Alcohol abuse    Anxiety    Cocaine abuse (HCC) 05/27/2016   Depression    Seizures (HCC)     Past Surgical History:  Procedure Laterality Date   FINGER SURGERY     SHOULDER CLOSED REDUCTION Left 03/14/2023   Procedure: Radiological Exam in OR;  Surgeon: Lorelle Hussar, MD;  Location: [facility] ORS;  Service: Orthopedics;  Laterality: Left;   SHOULDER CLOSED REDUCTION Left 08/31/2023   Procedure: CLOSED REDUCTION SHOULDER;  Surgeon: Genelle Standing, MD;  Location: [facility] ORS;  Service: Orthopedics;  Laterality: Left;   SHOULDER CLOSED REDUCTION Left 06/30/2023   Procedure: CLOSED REDUCTION SHOULDER;  Surgeon: Tobie Priest, MD;  Location: [facility] ORS;  Service: Orthopedics;  Laterality: Left;    MEDICATIONS:  Prior to Admission medications   Medication Sig Start Date End Date Taking? Authorizing Provider  acetaminophen  (TYLENOL ) 500 MG tablet Take 2 tablets (1,000 mg total) by mouth every 8 (eight) hours. 06/30/23 06/29/24  Tobie Priest, MD  citalopram  (CELEXA ) 40 MG tablet Take 40 mg by mouth daily.    [provider]  lithium  carbonate 300 MG capsule Take 300 mg by mouth 2 (two) times daily with a meal.    [provider]  meloxicam  (MOBIC ) 15 MG tablet Take 1 tablet (15 mg total) by mouth daily. Patient not taking: Reported on 04/24/2024 06/30/23 06/29/24  Tobie Priest, MD  omeprazole (PRILOSEC) 20 MG capsule Take 20 mg by mouth 2 (two) times daily  before a meal.    [provider]  ondansetron  (ZOFRAN -ODT) 4 MG disintegrating tablet Take 1 tablet (4 mg total) by mouth every 6 (six) hours as needed for nausea or vomiting. 08/20/23   Mitchell Epling, Josette SAILOR, DO  promethazine  (PHENERGAN ) 25 MG tablet Take 25 mg by mouth daily as needed. 11/08/23   [provider]  propranolol  (INDERAL ) 20 MG tablet Take 20 mg by mouth 2 (two) times daily.    [provider]  traMADol  (ULTRAM ) 50 MG tablet Take 1 tablet (50 mg total) by mouth every 6 (six) hours as needed. Patient not taking: Reported on 04/24/2024 09/01/23   Barbarann Nest, MD  traZODone  (DESYREL ) 150 MG tablet Take 150 mg by mouth at bedtime.    [provider]  triamterene -hydrochlorothiazide  (DYAZIDE ) 37.5-25 MG capsule Take 1 capsule by mouth daily.    [provider]  valproic  acid (DEPAKENE ) 250 MG capsule Take 3 capsules (750 mg total) by mouth at bedtime. 06/23/23 09/21/23  Cyrena Mylar, MD    Physical Exam   Triage Vital Signs: ED Triage Vitals  Encounter Vitals Group     BP 05/02/24 0001 109/63     Girls Systolic BP Percentile --      Girls Diastolic BP Percentile --      Boys Systolic BP Percentile --      Boys Diastolic BP Percentile --      Pulse Rate 05/02/24 0001 79  Resp 05/02/24 0001 18     Temp 05/02/24 0001 98.1 F (36.7 C)     Temp Source 05/02/24 0001 Oral     SpO2 05/02/24 0001 100 %     Weight --      Height --      Head Circumference --      Peak Flow --      Pain Score 05/02/24 0010 0     Pain Loc --      Pain Education --      Exclude from Growth Chart --     Most recent vital signs: Vitals:   05/02/24 0001  BP: 109/63  Pulse: 79  Resp: 18  Temp: 98.1 F (36.7 C)  SpO2: 100%    CONSTITUTIONAL: Alert, responds appropriately to questions.  Obese, chronically ill-appearing HEAD: Normocephalic, atraumatic EYES: Conjunctivae clear, pupils appear equal, sclera nonicteric ENT: normal nose; moist mucous  membranes NECK: Supple, normal ROM CARD: RRR; S1 and S2 appreciated RESP: Normal chest excursion without splinting or tachypnea; breath sounds clear and equal bilaterally; no wheezes, no rhonchi, no rales, no hypoxia or respiratory distress, speaking full sentences ABD/GI: Non-distended; soft, non-tender, no rebound, no guarding, no peritoneal signs BACK: The back appears normal EXT: Normal ROM in all joints; no deformity noted, no edema SKIN: Normal color for age and race; warm; no rash on exposed skin NEURO: Moves all extremities equally, normal speech, ambulates with normal gait PSYCH: The patient's mood and manner are appropriate.  Denies SI, HI   ED Results / Procedures / Treatments   LABS: (all labs ordered are listed, but only abnormal results are displayed) Labs Reviewed  CBC WITH DIFFERENTIAL/PLATELET - Abnormal; Notable for the following components:      Result Value   RBC 3.88 (*)    Hemoglobin 11.8 (*)    HCT 38.3 (*)    All other components within normal limits  COMPREHENSIVE METABOLIC PANEL WITH GFR - Abnormal; Notable for the following components:   Sodium 134 (*)    Glucose, Bld 102 (*)    Calcium 8.5 (*)    Albumin 3.2 (*)    All other components within normal limits  ETHANOL - Abnormal; Notable for the following components:   Alcohol, Ethyl (B) 137 (*)    All other components within normal limits     EKG:  EKG Interpretation Date/Time:    Ventricular Rate:    PR Interval:    QRS Duration:    QT Interval:    QTC Calculation:   R Axis:      Text Interpretation:           RADIOLOGY: My personal review and interpretation of imaging:    I have personally reviewed all radiology reports.   No results found.   PROCEDURES:  Critical Care performed: No    Procedures    IMPRESSION / MDM / ASSESSMENT AND PLAN / ED COURSE  I reviewed the triage vital signs and the nursing notes.    Patient here with alcohol intoxication.  Has no acute  complaints.    DIFFERENTIAL DIAGNOSIS (includes but not limited to):   Alcohol intoxication, homelessness, malingering, depression   Patient's presentation is most consistent with acute complicated illness / injury requiring diagnostic workup.   PLAN: Will monitor patient until clinically sober.  He denies any complaints at this time.  No SI, HI.  Not responding to internal stimuli.   MEDICATIONS GIVEN IN ED: Medications - No data to display  ED COURSE: Labs show mild anemia which is chronic and stable.  Normal electrolytes, LFTs.  Alcohol level of 137.  No signs of alcohol withdrawal.  Patient is tolerating p.o., ambulating here without difficulty.  He still denies SI, HI but now states that he needs to stay here in the ED and check-in.  When asked for him to elaborate further it appears that he means to go to rehab.  Discussed with patient that we do not offer inpatient rehab from the hospital and that I am happy to provide him outpatient resources.  There is no indication to consult psych emergently.  He states that he cannot leave because it is raining outside.  Offered to provide him with information for shelters as well.  Patient asking for more to eat but unfortunately we do not have any more sandwich trays.  He has been given crackers but does not feel this was sufficient.  I went to evaluate another patient and when I came back to work on his discharge papers, patient had already left without obtaining any resources.  Patient then went out into the parking lot of the emergency department and pressed the emergency button for police multiple times.  At this time, I do not feel there is any life-threatening condition present. I reviewed all nursing notes, vitals, pertinent previous records.  All lab and urine results, EKGs, imaging ordered have been independently reviewed and interpreted by myself.  I reviewed all available radiology reports from any imaging ordered this visit.  Based on  my assessment, I feel the patient is safe to be discharged home without further emergent workup and can continue workup as an outpatient as needed. Discussed all findings, treatment plan as well as usual and customary return precautions.  They verbalize understanding and are comfortable with this plan.  Outpatient follow-up has been provided as needed.  All questions have been answered.    CONSULTS:  none   OUTSIDE RECORDS REVIEWED: Reviewed recent psychiatric notes.       FINAL CLINICAL IMPRESSION(S) / ED DIAGNOSES   Final diagnoses:  Polysubstance abuse (HCC)     Rx / DC Orders   ED Discharge Orders     None        Note:  This document was prepared using Dragon voice recognition software and may include unintentional dictation errors.   Justina Bertini, Josette SAILOR, DO 05/02/24 [phone]

## 2024-05-02 NOTE — ED Triage Notes (Signed)
 Arrives from scene, [employer] found patient unresponsive laying on ground. [facility] arrival and took significant sternal rub for patient to respond. Pt reports drinking 3 40's today. Was discharged this morning from [facility] for same.

## 2024-06-10 ENCOUNTER — Encounter: Payer: Self-pay | Admitting: Emergency Medicine

## 2024-06-10 ENCOUNTER — Emergency Department
Admission: EM | Admit: 2024-06-10 | Discharge: 2024-06-10 | Disposition: A | Discharge status: Home / Self Care | Payer: MEDICAID

## 2024-06-10 ENCOUNTER — Other Ambulatory Visit: Payer: Self-pay

## 2024-06-10 ENCOUNTER — Emergency Department: Payer: MEDICAID

## 2024-06-10 ENCOUNTER — Other Ambulatory Visit (HOSPITAL_COMMUNITY): Admission: EM | Admit: 2024-06-10 | Payer: MEDICAID | Source: Intra-hospital

## 2024-06-10 DIAGNOSIS — Z79899 Other long term (current) drug therapy: Secondary | ICD-10-CM | POA: Diagnosis not present

## 2024-06-10 DIAGNOSIS — M25561 Pain in right knee: Secondary | ICD-10-CM | POA: Diagnosis not present

## 2024-06-10 DIAGNOSIS — M79604 Pain in right leg: Secondary | ICD-10-CM | POA: Diagnosis not present

## 2024-06-10 DIAGNOSIS — F1420 Cocaine dependence, uncomplicated: Secondary | ICD-10-CM | POA: Diagnosis present

## 2024-06-10 DIAGNOSIS — R45851 Suicidal ideations: Secondary | ICD-10-CM | POA: Diagnosis not present

## 2024-06-10 DIAGNOSIS — Y908 Blood alcohol level of 240 mg/100 ml or more: Secondary | ICD-10-CM | POA: Diagnosis not present

## 2024-06-10 DIAGNOSIS — Z91010 Allergy to peanuts: Secondary | ICD-10-CM | POA: Insufficient documentation

## 2024-06-10 DIAGNOSIS — M545 Low back pain, unspecified: Secondary | ICD-10-CM | POA: Diagnosis not present

## 2024-06-10 DIAGNOSIS — G8929 Other chronic pain: Secondary | ICD-10-CM | POA: Diagnosis not present

## 2024-06-10 DIAGNOSIS — F1010 Alcohol abuse, uncomplicated: Secondary | ICD-10-CM | POA: Diagnosis present

## 2024-06-10 DIAGNOSIS — F1029 Alcohol dependence with unspecified alcohol-induced disorder: Secondary | ICD-10-CM | POA: Diagnosis present

## 2024-06-10 LAB — CBC
HCT: 40.5 % (ref 39.0–52.0)
Hemoglobin: 12.9 g/dL — ABNORMAL LOW (ref 13.0–17.0)
MCH: 28.1 pg (ref 26.0–34.0)
MCHC: 31.9 g/dL (ref 30.0–36.0)
MCV: 88.2 fL (ref 80.0–100.0)
Platelets: 292 K/uL (ref 150–400)
RBC: 4.59 MIL/uL (ref 4.22–5.81)
RDW: 16 % — ABNORMAL HIGH (ref 11.5–15.5)
WBC: 6.5 K/uL (ref 4.0–10.5)
nRBC: 0 % (ref 0.0–0.2)

## 2024-06-10 LAB — COMPREHENSIVE METABOLIC PANEL WITH GFR
ALT: 14 U/L (ref 0–44)
AST: 21 U/L (ref 15–41)
Albumin: 3.8 g/dL (ref 3.5–5.0)
Alkaline Phosphatase: 91 U/L (ref 38–126)
Anion gap: 11 (ref 5–15)
BUN: 10 mg/dL (ref 6–20)
CO2: 27 mmol/L (ref 22–32)
Calcium: 8.5 mg/dL — ABNORMAL LOW (ref 8.9–10.3)
Chloride: 102 mmol/L (ref 98–111)
Creatinine, Ser: 0.83 mg/dL (ref 0.61–1.24)
GFR, Estimated: >60 mL/min (ref >60)
Glucose, Bld: 96 mg/dL (ref 70–99)
Potassium: 4.2 mmol/L (ref 3.5–5.1)
Sodium: 140 mmol/L (ref 135–145)
Total Bilirubin: 0.5 mg/dL (ref 0.0–1.2)
Total Protein: 7.5 g/dL (ref 6.5–8.1)

## 2024-06-10 LAB — URINE DRUG SCREEN, QUALITATIVE (ARMC ONLY)
Amphetamines, Ur Screen: NOT DETECTED
Barbiturates, Ur Screen: NOT DETECTED
Benzodiazepine, Ur Scrn: NOT DETECTED
Cannabinoid 50 Ng, Ur ~~LOC~~: NOT DETECTED
Cocaine Metabolite,Ur ~~LOC~~: POSITIVE — AB
MDMA (Ecstasy)Ur Screen: NOT DETECTED
Methadone Scn, Ur: NOT DETECTED
Opiate, Ur Screen: NOT DETECTED
Phencyclidine (PCP) Ur S: NOT DETECTED
Tricyclic, Ur Screen: NOT DETECTED

## 2024-06-10 LAB — ETHANOL: Alcohol, Ethyl (B): 287 mg/dL — ABNORMAL HIGH (ref <15)

## 2024-06-10 MED ORDER — ACETAMINOPHEN 500 MG PO TABS: 1000.0000 mg | ORAL_TABLET | Freq: Three times a day (TID) | ORAL | Status: DC

## 2024-06-10 MED ORDER — PANTOPRAZOLE SODIUM 40 MG PO TBEC: 40.0000 mg | DELAYED_RELEASE_TABLET | Freq: Every day | ORAL | Status: DC

## 2024-06-10 MED ORDER — PROMETHAZINE HCL 25 MG PO TABS: 25.0000 mg | ORAL_TABLET | Freq: Every day | ORAL | Status: DC | PRN

## 2024-06-10 NOTE — ED Provider Notes (Signed)
 [facility] Provider Note    Event Date/Time   First MD Initiated Contact with Patient 06/10/24 0102     (approximate)   History   No chief complaint on file.  Patient BIB [facility] from the bus stop c/o chronic back and leg pain.  Patient reports falling into hole yesterday and hurting his right leg, radiating up into his back.  Patient states that he feels like hurting himself and told EMS that if they didn't take him to the hospital, he was going to jump [place] the [place].  Patient also reports wanting to hurt others, but no plan and no one specific.  Patient reports drinking 3-4 beers tonight. Patient states don't put me in the hall.  I want to be by myself.   144/93 79 HR 13 RR Etco2 43   HPI Alex [name] is a 48 y.o. male PMH polysubstance use, homelessness, seizures, malingering, anxiety/depression presents for evaluation of chronic back and leg pain as well as suicidal ideation - Patient tells me he is here because he has been working about killing himself.  Tells me he wanted to jump off a bridge.  Also tells me he was considering hurting to other people but did not know their names.  States he is very tired of being straight.  Does endorse some alcohol intake tonight, denies any other substances.  Notes he did fall into a small hole yesterday, has been having some right knee pain since then       Physical Exam   Triage Vital Signs: ED Triage Vitals  Encounter Vitals Group     BP 06/10/24 0024 (!) 128/93     Girls Systolic BP Percentile --      Girls Diastolic BP Percentile --      Boys Systolic BP Percentile --      Boys Diastolic BP Percentile --      Pulse Rate 06/10/24 0024 75     Resp 06/10/24 0024 18     Temp 06/10/24 0024 97.8 F (36.6 C)     Temp Source 06/10/24 0024 Oral     SpO2 06/10/24 0024 95 %     Weight 06/10/24 0022 224 lb 13.9 oz (102 kg)     Height --      Head Circumference --      Peak Flow --      Pain  Score 06/10/24 0021 10     Pain Loc --      Pain Education --      Exclude from Growth Chart --     Most recent vital signs: Vitals:   06/10/24 0024  BP: (!) 128/93  Pulse: 75  Resp: 18  Temp: 97.8 F (36.6 C)  SpO2: 95%     General: Awake, no distress.  HEENT: Normocephalic, atraumatic CV:  Good peripheral perfusion. RRR, RP 2+ Resp:  Normal effort. CTAB Abd:  No distention. Nontender to deep palpation throughout Back:  No midline tenderness Other:  Right knee with mild tenderness to palpation.  No appreciable effusion.  Range of motion intact.  No overlying erythema or warmth.  No deformity. Psych:  Endorses SI and HI, denies hallucinations.  Calm and cooperative.   ED Results / Procedures / Treatments   Labs (all labs ordered are listed, but only abnormal results are displayed) Labs Reviewed  COMPREHENSIVE METABOLIC PANEL WITH GFR - Abnormal; Notable for the following components:      Result Value   Calcium  8.5 (*)    All other components within normal limits  ETHANOL - Abnormal; Notable for the following components:   Alcohol, Ethyl (B) 287 (*)    All other components within normal limits  CBC - Abnormal; Notable for the following components:   Hemoglobin 12.9 (*)    RDW 16.0 (*)    All other components within normal limits  URINE DRUG SCREEN, QUALITATIVE ([facility] ONLY) - Abnormal; Notable for the following components:   Cocaine Metabolite,Ur [place] POSITIVE (*)    All other components within normal limits     EKG  See ED course below.   RADIOLOGY Radiology interpreted by myself and radiology report reviewed.  No acute pathology identified.    PROCEDURES:  Critical Care performed: No  Procedures   MEDICATIONS ORDERED IN ED: Medications - No data to display   IMPRESSION / MDM / ASSESSMENT AND PLAN / ED COURSE  I reviewed the triage vital signs and the nursing notes.                              DDX/MDM/AP: Differential diagnosis includes, but  is not limited to, suicidal ideation, suspect primary underlying psychiatric disorder, suspect concomitant substance use contributing.  With regard to right knee, consider sprain/ligamentous injury, highly doubt underlying fracture or dislocation.  Plan: - Labs - Psychiatry consult, will defer IVC at this time, patient requesting psychiatry eval - XR right knee - Denies need for pain control  Patient's presentation is most consistent with acute presentation with potential threat to life or bodily function.  ED course below.  Medically cleared.  Patient signed out to oncoming ED provider pending psychiatric evaluation.  Not currently on IVC.  Clinical Course as of 06/10/24 9351  [facility] Jun 10, 2024  0326 CBC, CMP reviewed, unremarkable  Alcohol elevated at 287  UDS positive for cocaine [MM]  0326 Ecg = sinus rhythm, rate 83, no gross ST elevation or depression, no significant repolarization abnormality, left axis deviation, QT mildly prolonged at 502.  No evidence of ischemia nor arrhythmia on my interpretation. [MM]  0327 XR R knee with no acute pathology my interpretation.  Radiology report below:  IMPRESSION: Degenerative change without acute abnormality.   [MM]    Clinical Course User Index [MM] Clarine Ozell LABOR, MD     FINAL CLINICAL IMPRESSION(S) / ED DIAGNOSES   Final diagnoses:  Suicidal ideation  Right knee pain, unspecified chronicity     Rx / DC Orders   ED Discharge Orders     None        Note:  This document was prepared using Dragon voice recognition software and may include unintentional dictation errors.   Clarine Ozell LABOR, MD 06/10/24 [phone]

## 2024-06-10 NOTE — ED Notes (Signed)
Psych NP and TTS at bedside 

## 2024-06-10 NOTE — ED Notes (Signed)
 Attempted to get voluntary consent for transfer- pt refuses to sign. Denies SI/HI. Psych NP made aware.

## 2024-06-10 NOTE — Discharge Instructions (Signed)
 Follow-up with your primary care doctor for further evaluation.  Of included outpatient resources regarding counseling.  Return to the ER for new or worsening symptoms.

## 2024-06-10 NOTE — ED Notes (Addendum)
 Patient belongings:  White socks, white and black slide sandals, blue underwear, blue shorts, black t shirt, white and black hat, 2 lighters, various loose change, vape.

## 2024-06-10 NOTE — ED Notes (Signed)
 Psych NP returned to patient

## 2024-06-10 NOTE — ED Notes (Signed)
 Spoke with patient about transfer and informed him that we would need to speak with his LG (there is an BURUNDI stating pt has LG), pt adamant that he does not have a LG. Called pts mother who was the reported LG and mother stated that she is over the patient and that he needs to go however when LG papers were asked for to prove that she is pts LG, she stated that she does not have legal paperwork. FYI deleted.

## 2024-06-10 NOTE — Progress Notes (Signed)
 Patient ID: Alex [name], male   DOB: 1976-09-06, 48 y.o.   MRN: [id number] Patient was offered placement at [facility]. He informed the nurse that he did not want to go there and denied SI and HI. This provider reassessed patient with TTS Counselor and patient again stated that he did not want to transition to the available bed placement and denied SI/HI and was agreeable to discharge with outpatient resources. Informed EDP of patient's decision.
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
## 2024-06-10 NOTE — ED Provider Notes (Signed)
 48 year old male who presented initially with leg pain, later reported suicidal thoughts.  Medically cleared, signed out to me pending psychiatric evaluation.  Patient under voluntary status.  Patient was evaluated psychiatry team and initial plan for psychiatric admission.  However, placement was found in [place] and patient reported that he did not wish to go to [facility].He was evaluated by NP [place] with psychiatry team and he denied SI or HI.  He did demonstrate decision-making capacity.  In the setting of this, she did recommend discharge with outpatient resources.   Patient assessed at bedside.  Denies SI or HI to me.  Do think he is stable for discharge home.  Patient discharged stable condition.   Levander Slate, MD 06/10/24 [phone]

## 2024-06-10 NOTE — BH Assessment (Signed)
 Outpatient substance use and mental health counseling resources have been attached to patient's AVS. Please refer to discharge instructions to review attachment.

## 2024-06-10 NOTE — BH Assessment (Signed)
 Pt is currently too somnolent to participate in assessment.  TTS will follow up with assessment at later time

## 2024-06-10 NOTE — ED Notes (Signed)
Vol /psych consult pending 

## 2024-06-10 NOTE — ED Triage Notes (Addendum)
 Patient BIB [facility] from the bus stop c/o chronic back and leg pain.  Patient reports falling into hole yesterday and hurting his right leg, radiating up into his back.  Patient states that he feels like hurting himself and told EMS that if they didn't take him to the hospital, he was going to jump off the highway overpass.  Patient also reports wanting to hurt others, but no plan and no one specific.  Patient reports drinking 3-4 beers tonight. Patient states don't put me in the hall.  I want to be by myself.   144/93 79 HR 13 RR Etco2 43

## 2024-06-13 ENCOUNTER — Emergency Department
Admission: EM | Admit: 2024-06-13 | Discharge: 2024-06-13 | Discharge status: Against Medical Advice | Payer: MEDICAID | Attending: Emergency Medicine | Admitting: Emergency Medicine

## 2024-06-13 ENCOUNTER — Other Ambulatory Visit: Payer: Self-pay

## 2024-06-13 ENCOUNTER — Encounter: Payer: Self-pay | Admitting: *Deleted

## 2024-06-13 DIAGNOSIS — M7918 Myalgia, other site: Secondary | ICD-10-CM | POA: Insufficient documentation

## 2024-06-13 DIAGNOSIS — F1010 Alcohol abuse, uncomplicated: Secondary | ICD-10-CM | POA: Insufficient documentation

## 2024-06-13 DIAGNOSIS — Z5321 Procedure and treatment not carried out due to patient leaving prior to being seen by health care provider: Secondary | ICD-10-CM | POA: Diagnosis not present

## 2024-06-13 DIAGNOSIS — Y908 Blood alcohol level of 240 mg/100 ml or more: Secondary | ICD-10-CM | POA: Insufficient documentation

## 2024-06-13 DIAGNOSIS — F1410 Cocaine abuse, uncomplicated: Secondary | ICD-10-CM | POA: Diagnosis not present

## 2024-06-13 LAB — COMPREHENSIVE METABOLIC PANEL WITH GFR
ALT: 16 U/L (ref 0–44)
AST: 24 U/L (ref 15–41)
Albumin: 4.2 g/dL (ref 3.5–5.0)
Alkaline Phosphatase: 96 U/L (ref 38–126)
Anion gap: 15 (ref 5–15)
BUN: 10 mg/dL (ref 6–20)
CO2: 21 mmol/L — ABNORMAL LOW (ref 22–32)
Calcium: 8.9 mg/dL (ref 8.9–10.3)
Chloride: 94 mmol/L — ABNORMAL LOW (ref 98–111)
Creatinine, Ser: 0.64 mg/dL (ref 0.61–1.24)
GFR, Estimated: >60 mL/min (ref >60)
Glucose, Bld: 93 mg/dL (ref 70–99)
Potassium: 3.8 mmol/L (ref 3.5–5.1)
Sodium: 130 mmol/L — ABNORMAL LOW (ref 135–145)
Total Bilirubin: 0.9 mg/dL (ref 0.0–1.2)
Total Protein: 8.1 g/dL (ref 6.5–8.1)

## 2024-06-13 LAB — CBC
HCT: 40.8 % (ref 39.0–52.0)
Hemoglobin: 13.3 g/dL (ref 13.0–17.0)
MCH: 27.9 pg (ref 26.0–34.0)
MCHC: 32.6 g/dL (ref 30.0–36.0)
MCV: 85.7 fL (ref 80.0–100.0)
Platelets: 239 K/uL (ref 150–400)
RBC: 4.76 MIL/uL (ref 4.22–5.81)
RDW: 15.8 % — ABNORMAL HIGH (ref 11.5–15.5)
WBC: 7.5 K/uL (ref 4.0–10.5)
nRBC: 0 % (ref 0.0–0.2)

## 2024-06-13 LAB — URINALYSIS, ROUTINE W REFLEX MICROSCOPIC
Bilirubin Urine: NEGATIVE
Glucose, UA: NEGATIVE mg/dL
Hgb urine dipstick: NEGATIVE
Ketones, ur: NEGATIVE mg/dL
Leukocytes,Ua: NEGATIVE
Nitrite: NEGATIVE
Protein, ur: NEGATIVE mg/dL
Specific Gravity, Urine: 1.002 — ABNORMAL LOW (ref 1.005–1.030)
pH: 7 (ref 5.0–8.0)

## 2024-06-13 LAB — RESP PANEL BY RT-PCR (RSV, FLU A&B, COVID)  RVPGX2
Influenza A by PCR: NEGATIVE
Influenza B by PCR: NEGATIVE
Resp Syncytial Virus by PCR: NEGATIVE
SARS Coronavirus 2 by RT PCR: NEGATIVE

## 2024-06-13 LAB — ETHANOL: Alcohol, Ethyl (B): 242 mg/dL — ABNORMAL HIGH (ref <15)
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
## 2024-06-13 NOTE — ED Notes (Addendum)
Pt wheeled to bathroom to provide urine sample.

## 2024-06-14 LAB — URINE DRUG SCREEN, QUALITATIVE (ARMC ONLY)
Amphetamines, Ur Screen: NOT DETECTED
Barbiturates, Ur Screen: NOT DETECTED
Benzodiazepine, Ur Scrn: NOT DETECTED
Cannabinoid 50 Ng, Ur ~~LOC~~: NOT DETECTED
Cocaine Metabolite,Ur ~~LOC~~: POSITIVE — AB
MDMA (Ecstasy)Ur Screen: NOT DETECTED
Methadone Scn, Ur: NOT DETECTED
Opiate, Ur Screen: NOT DETECTED
Phencyclidine (PCP) Ur S: NOT DETECTED
Tricyclic, Ur Screen: NOT DETECTED

## 2024-06-18 ENCOUNTER — Emergency Department
Admission: EM | Admit: 2024-06-18 | Discharge: 2024-06-18 | Disposition: A | Discharge status: Home / Self Care | Payer: MEDICAID | Attending: Emergency Medicine | Admitting: Emergency Medicine

## 2024-06-18 ENCOUNTER — Other Ambulatory Visit: Payer: Self-pay

## 2024-06-18 ENCOUNTER — Emergency Department: Payer: MEDICAID

## 2024-06-18 DIAGNOSIS — Y909 Presence of alcohol in blood, level not specified: Secondary | ICD-10-CM | POA: Diagnosis not present

## 2024-06-18 DIAGNOSIS — S0083XA Contusion of other part of head, initial encounter: Secondary | ICD-10-CM

## 2024-06-18 DIAGNOSIS — S0990XA Unspecified injury of head, initial encounter: Secondary | ICD-10-CM | POA: Diagnosis present

## 2024-06-18 DIAGNOSIS — S0031XA Abrasion of nose, initial encounter: Secondary | ICD-10-CM | POA: Diagnosis not present

## 2024-06-18 DIAGNOSIS — S060X0A Concussion without loss of consciousness, initial encounter: Secondary | ICD-10-CM | POA: Diagnosis not present

## 2024-06-18 DIAGNOSIS — M542 Cervicalgia: Secondary | ICD-10-CM | POA: Insufficient documentation

## 2024-06-18 DIAGNOSIS — F10120 Alcohol abuse with intoxication, uncomplicated: Secondary | ICD-10-CM | POA: Diagnosis not present

## 2024-06-18 DIAGNOSIS — F10920 Alcohol use, unspecified with intoxication, uncomplicated: Secondary | ICD-10-CM

## 2024-06-18 MED ORDER — VALPROIC ACID 250 MG PO CAPS
750.0000 mg | ORAL_CAPSULE | Freq: Once | ORAL | Status: AC
  Administered 2024-06-18: 750 mg via ORAL
  Filled 2024-06-18: qty 3
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
## 2024-07-08 ENCOUNTER — Emergency Department
Admission: EM | Admit: 2024-07-08 | Discharge: 2024-07-09 | Disposition: A | Discharge status: Home / Self Care | Payer: MEDICAID | Attending: Emergency Medicine | Admitting: Emergency Medicine

## 2024-07-08 ENCOUNTER — Emergency Department: Payer: MEDICAID

## 2024-07-08 DIAGNOSIS — R45851 Suicidal ideations: Secondary | ICD-10-CM | POA: Insufficient documentation

## 2024-07-08 DIAGNOSIS — F17210 Nicotine dependence, cigarettes, uncomplicated: Secondary | ICD-10-CM | POA: Diagnosis not present

## 2024-07-08 DIAGNOSIS — S0990XA Unspecified injury of head, initial encounter: Secondary | ICD-10-CM | POA: Diagnosis not present

## 2024-07-08 DIAGNOSIS — Z5900 Homelessness unspecified: Secondary | ICD-10-CM | POA: Insufficient documentation

## 2024-07-08 DIAGNOSIS — F39 Unspecified mood [affective] disorder: Secondary | ICD-10-CM

## 2024-07-08 DIAGNOSIS — S0031XA Abrasion of nose, initial encounter: Secondary | ICD-10-CM | POA: Diagnosis not present

## 2024-07-08 DIAGNOSIS — F1020 Alcohol dependence, uncomplicated: Secondary | ICD-10-CM

## 2024-07-08 DIAGNOSIS — F1420 Cocaine dependence, uncomplicated: Secondary | ICD-10-CM | POA: Diagnosis not present

## 2024-07-08 DIAGNOSIS — Z23 Encounter for immunization: Secondary | ICD-10-CM | POA: Diagnosis not present

## 2024-07-08 DIAGNOSIS — Y906 Blood alcohol level of 120-199 mg/100 ml: Secondary | ICD-10-CM | POA: Diagnosis not present

## 2024-07-08 DIAGNOSIS — F1910 Other psychoactive substance abuse, uncomplicated: Secondary | ICD-10-CM

## 2024-07-08 DIAGNOSIS — S0992XA Unspecified injury of nose, initial encounter: Secondary | ICD-10-CM | POA: Diagnosis present

## 2024-07-08 LAB — ACETAMINOPHEN LEVEL: Acetaminophen (Tylenol), Serum: <10 ug/mL — ABNORMAL LOW (ref 10–30)

## 2024-07-08 LAB — COMPREHENSIVE METABOLIC PANEL WITH GFR
ALT: 17 U/L (ref 0–44)
AST: 19 U/L (ref 15–41)
Albumin: 4 g/dL (ref 3.5–5.0)
Alkaline Phosphatase: 66 U/L (ref 38–126)
Anion gap: 12 (ref 5–15)
BUN: 15 mg/dL (ref 6–20)
CO2: 21 mmol/L — ABNORMAL LOW (ref 22–32)
Calcium: 8.9 mg/dL (ref 8.9–10.3)
Chloride: 98 mmol/L (ref 98–111)
Creatinine, Ser: 0.94 mg/dL (ref 0.61–1.24)
GFR, Estimated: >60 mL/min (ref >60)
Glucose, Bld: 114 mg/dL — ABNORMAL HIGH (ref 70–99)
Potassium: 3.9 mmol/L (ref 3.5–5.1)
Sodium: 131 mmol/L — ABNORMAL LOW (ref 135–145)
Total Bilirubin: 0.5 mg/dL (ref 0.0–1.2)
Total Protein: 7.8 g/dL (ref 6.5–8.1)

## 2024-07-08 LAB — URINE DRUG SCREEN, QUALITATIVE (ARMC ONLY)
Amphetamines, Ur Screen: NOT DETECTED
Barbiturates, Ur Screen: NOT DETECTED
Benzodiazepine, Ur Scrn: NOT DETECTED
Cannabinoid 50 Ng, Ur ~~LOC~~: NOT DETECTED
Cocaine Metabolite,Ur ~~LOC~~: POSITIVE — AB
MDMA (Ecstasy)Ur Screen: NOT DETECTED
Methadone Scn, Ur: NOT DETECTED
Opiate, Ur Screen: NOT DETECTED
Phencyclidine (PCP) Ur S: NOT DETECTED
Tricyclic, Ur Screen: NOT DETECTED

## 2024-07-08 LAB — CBC
HCT: 44.1 % (ref 39.0–52.0)
Hemoglobin: 13.9 g/dL (ref 13.0–17.0)
MCH: 27.3 pg (ref 26.0–34.0)
MCHC: 31.5 g/dL (ref 30.0–36.0)
MCV: 86.6 fL (ref 80.0–100.0)
Platelets: 217 K/uL (ref 150–400)
RBC: 5.09 MIL/uL (ref 4.22–5.81)
RDW: 16.7 % — ABNORMAL HIGH (ref 11.5–15.5)
WBC: 8.5 K/uL (ref 4.0–10.5)
nRBC: 0 % (ref 0.0–0.2)

## 2024-07-08 LAB — SALICYLATE LEVEL: Salicylate Lvl: <7 mg/dL — ABNORMAL LOW (ref 7.0–30.0)

## 2024-07-08 LAB — ETHANOL: Alcohol, Ethyl (B): 181 mg/dL — ABNORMAL HIGH (ref <15)

## 2024-07-08 MED ORDER — LORAZEPAM 2 MG/ML IJ SOLN: 0.0000 mg | Freq: Four times a day (QID) | INTRAMUSCULAR | Status: DC

## 2024-07-08 MED ORDER — LORAZEPAM 2 MG PO TABS: 0.0000 mg | ORAL_TABLET | Freq: Two times a day (BID) | ORAL | Status: DC

## 2024-07-08 MED ORDER — LORAZEPAM 2 MG/ML IJ SOLN: 0.0000 mg | Freq: Two times a day (BID) | INTRAMUSCULAR | Status: DC

## 2024-07-08 MED ORDER — ACETAMINOPHEN 500 MG PO TABS
1000.0000 mg | ORAL_TABLET | Freq: Once | ORAL | Status: AC
  Administered 2024-07-08: 1000 mg via ORAL
  Filled 2024-07-08: qty 2

## 2024-07-08 MED ORDER — LORAZEPAM 2 MG PO TABS
0.0000 mg | ORAL_TABLET | Freq: Four times a day (QID) | ORAL | Status: DC
  Administered 2024-07-08: 2 mg via ORAL
  Filled 2024-07-08: qty 1

## 2024-07-08 MED ORDER — THIAMINE HCL 100 MG/ML IJ SOLN: 100.0000 mg | Freq: Every day | INTRAMUSCULAR | Status: DC

## 2024-07-08 MED ORDER — TETANUS-DIPHTH-ACELL PERTUSSIS 5-2-15.5 LF-MCG/0.5 IM SUSP
0.5000 mL | Freq: Once | INTRAMUSCULAR | Status: AC
  Administered 2024-07-08: 0.5 mL via INTRAMUSCULAR
  Filled 2024-07-08: qty 0.5

## 2024-07-08 MED ORDER — THIAMINE MONONITRATE 100 MG PO TABS
100.0000 mg | ORAL_TABLET | Freq: Every day | ORAL | Status: DC
  Administered 2024-07-09: 100 mg via ORAL
  Filled 2024-07-08: qty 1

## 2024-07-08 MED ORDER — ONDANSETRON 4 MG PO TBDP
4.0000 mg | ORAL_TABLET | Freq: Once | ORAL | Status: AC
  Administered 2024-07-08: 4 mg via ORAL
  Filled 2024-07-08: qty 1
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
## 2024-07-08 NOTE — ED Triage Notes (Signed)
 Patient brought in POV stating he is HI/SI. Mental Health history.   Patient also states he think he have been around someone with COVID. Patient states he been coughing and congestion. Symptoms started 2 days ago.  ETOH on board.

## 2024-07-08 NOTE — ED Notes (Signed)
 Pt ambulated to bathroom and back with steady gait
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
## 2024-07-09 ENCOUNTER — Encounter: Payer: Self-pay | Admitting: Psychiatric/Mental Health

## 2024-07-09 ENCOUNTER — Emergency Department: Payer: MEDICAID

## 2024-07-09 DIAGNOSIS — F39 Unspecified mood [affective] disorder: Secondary | ICD-10-CM

## 2024-07-09 DIAGNOSIS — F1020 Alcohol dependence, uncomplicated: Secondary | ICD-10-CM

## 2024-07-09 DIAGNOSIS — F1420 Cocaine dependence, uncomplicated: Secondary | ICD-10-CM

## 2024-07-09 DIAGNOSIS — Z5900 Homelessness unspecified: Secondary | ICD-10-CM

## 2024-07-09 LAB — RESP PANEL BY RT-PCR (RSV, FLU A&B, COVID)  RVPGX2
Influenza A by PCR: NEGATIVE
Influenza B by PCR: NEGATIVE
Resp Syncytial Virus by PCR: NEGATIVE
SARS Coronavirus 2 by RT PCR: NEGATIVE

## 2024-07-09 LAB — LITHIUM LEVEL: Lithium Lvl: 0.57 mmol/L — ABNORMAL LOW (ref 0.60–1.20)

## 2024-07-09 LAB — VALPROIC ACID LEVEL: Valproic Acid Lvl: 16 ug/mL — ABNORMAL LOW (ref 50–100)

## 2024-07-09 MED ORDER — CITALOPRAM HYDROBROMIDE 20 MG PO TABS
40.0000 mg | ORAL_TABLET | Freq: Every day | ORAL | Status: DC
  Administered 2024-07-09: 40 mg via ORAL
  Filled 2024-07-09: qty 2

## 2024-07-09 MED ORDER — TRAZODONE HCL 50 MG PO TABS
150.0000 mg | ORAL_TABLET | Freq: Every day | ORAL | Status: DC
  Administered 2024-07-09: 150 mg via ORAL
  Filled 2024-07-09: qty 1

## 2024-07-09 MED ORDER — LITHIUM CARBONATE 300 MG PO CAPS
300.0000 mg | ORAL_CAPSULE | Freq: Two times a day (BID) | ORAL | Status: DC
  Filled 2024-07-09: qty 1

## 2024-07-09 MED ORDER — PANTOPRAZOLE SODIUM 40 MG PO TBEC
40.0000 mg | DELAYED_RELEASE_TABLET | Freq: Every day | ORAL | Status: DC
  Administered 2024-07-09: 40 mg via ORAL
  Filled 2024-07-09: qty 1

## 2024-07-09 MED ORDER — TRIAMTERENE-HCTZ 37.5-25 MG PO TABS
1.0000 | ORAL_TABLET | Freq: Every day | ORAL | Status: DC
  Administered 2024-07-09: 1 via ORAL
  Filled 2024-07-09: qty 1

## 2024-07-09 MED ORDER — LITHIUM CARBONATE 300 MG PO CAPS
300.0000 mg | ORAL_CAPSULE | Freq: Two times a day (BID) | ORAL | Status: DC
  Administered 2024-07-09 (×2): 300 mg via ORAL
  Filled 2024-07-09 (×3): qty 1

## 2024-07-09 MED ORDER — PROPRANOLOL HCL 20 MG PO TABS
20.0000 mg | ORAL_TABLET | Freq: Two times a day (BID) | ORAL | Status: DC
  Administered 2024-07-09 (×2): 20 mg via ORAL
  Filled 2024-07-09 (×2): qty 1

## 2024-07-09 MED ORDER — VALPROIC ACID 250 MG PO CAPS
750.0000 mg | ORAL_CAPSULE | Freq: Every day | ORAL | Status: DC
Start: 2024-07-09 — End: 2024-07-09
  Administered 2024-07-09: 750 mg via ORAL
  Filled 2024-07-09: qty 3

## 2024-07-09 NOTE — ED Notes (Signed)
 Patient is resting comfortably.

## 2024-07-09 NOTE — ED Notes (Signed)
 Pt escorted back from interview room and to bathroom by NT Arianne.  Pt back in bed at this time with eyes closed.

## 2024-07-09 NOTE — ED Notes (Signed)
 Hospital meal provided.  100% consumed, pt tolerated w/o complaints.  Waste discarded appropriately.  Verified correct patient and correct discharge papers given. Pt alert and oriented X 4, stable for discharge. RR even and unlabored, color WNL. Discussed discharge instructions and follow-up as directed. Discharge medications discussed, when prescribed. Pt had opportunity to ask questions, and RN available to provide patient and/or family education. Able to ambulate without difficulty.

## 2024-07-09 NOTE — ED Notes (Signed)
 Pt awake, notified of D/C and given phone to call for a ride.
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
## 2024-07-09 NOTE — ED Notes (Signed)
 Patient transported to CT accompanied by security guard Eva.

## 2024-07-09 NOTE — ED Notes (Signed)
 Pt returned from CT at this time accompanied by security guard Eva.  Bed adjusted for pt comfort by NT Arianne.

## 2024-07-09 NOTE — Progress Notes (Signed)
 Patient known to nurse navigator from [employer]. Previous to ER presentation, patient was in a long-term substance abuse program in [place] where he apparently signed himself out.  When asked about how he got back to [facility], patient states [facility] brought me back. Patient stated he got tired of the bullsh*t in [place] and that's why he left.  Patient demanding that nurse navigator or case worker from [employer] find [him] a place to go. Patient informed he had burnt all his bridges in this area, so this would not be possible. Patient unfortunately has a long history of going to treatment centers and then signing himself out. Patient then requested nurse navigator check with [facility]. Call placed to [facility]. No beds available. Patient informed he would have to return to his mother's, where he was previously residing.

## 2024-07-09 NOTE — ED Notes (Signed)
 Pt escorted to interview room for [facility] consult by this RN and NT Arianne.  NT remains outside door of interview room at this time for safety.

## 2024-07-09 NOTE — ED Notes (Signed)
 All pt belongings returned at this time.

## 2024-07-09 NOTE — ED Notes (Signed)
 VOL/ Consult complete/ DC in AM

## 2024-07-09 NOTE — ED Notes (Signed)
 [facility] coordinator contacted and notified of pt order for psych consult, spoke with Kristie.  Secure chat with [facility] Coordinators initiated at this time.

## 2024-07-09 NOTE — ED Notes (Signed)
 Meal provided patient sleeping left at bedside

## 2024-07-09 NOTE — ED Notes (Signed)
Pt given a sandwich tray and ice water.

## 2024-07-09 NOTE — Discharge Instructions (Addendum)
 Your lab work today showed no acute changes.  CT scans of your head, face, neck and thoracic spine showed no traumatic injury.  Your COVID, flu, RSV test were negative.  Your chest x-ray showed no pneumonia.  You may alternate over the counter Tylenol  1000 mg every 6 hours as needed for pain, fever and Ibuprofen  800 mg every 6-8 hours as needed for pain, fever.  Please take Ibuprofen  with food.  Do not take more than 4000 mg of Tylenol  (acetaminophen ) in a 24 hour period.  You were seen by the psychiatric team who does not feel you need inpatient psychiatric treatment but recommends outpatient follow-up for help with your chronic suicide and homicidal thoughts and substance use disorder.  We have provided this information for you as well as information for shelters for your homelessness.

## 2024-07-11 ENCOUNTER — Emergency Department
Admission: EM | Admit: 2024-07-11 | Discharge: 2024-07-11 | Disposition: A | Discharge status: Home / Self Care | Payer: MEDICAID | Attending: Emergency Medicine | Admitting: Emergency Medicine

## 2024-07-11 ENCOUNTER — Other Ambulatory Visit: Payer: Self-pay

## 2024-07-11 ENCOUNTER — Encounter: Payer: Self-pay | Admitting: Emergency Medicine

## 2024-07-11 ENCOUNTER — Emergency Department: Payer: MEDICAID

## 2024-07-11 DIAGNOSIS — F1494 Cocaine use, unspecified with cocaine-induced mood disorder: Secondary | ICD-10-CM | POA: Diagnosis not present

## 2024-07-11 DIAGNOSIS — Y906 Blood alcohol level of 120-199 mg/100 ml: Secondary | ICD-10-CM | POA: Insufficient documentation

## 2024-07-11 DIAGNOSIS — F10920 Alcohol use, unspecified with intoxication, uncomplicated: Secondary | ICD-10-CM

## 2024-07-11 DIAGNOSIS — R45851 Suicidal ideations: Secondary | ICD-10-CM | POA: Insufficient documentation

## 2024-07-11 DIAGNOSIS — F1994 Other psychoactive substance use, unspecified with psychoactive substance-induced mood disorder: Secondary | ICD-10-CM

## 2024-07-11 DIAGNOSIS — F12988 Cannabis use, unspecified with other cannabis-induced disorder: Secondary | ICD-10-CM | POA: Insufficient documentation

## 2024-07-11 DIAGNOSIS — F10129 Alcohol abuse with intoxication, unspecified: Secondary | ICD-10-CM | POA: Insufficient documentation

## 2024-07-11 DIAGNOSIS — N50811 Right testicular pain: Secondary | ICD-10-CM | POA: Insufficient documentation

## 2024-07-11 LAB — COMPREHENSIVE METABOLIC PANEL WITH GFR
ALT: 21 U/L (ref 0–44)
AST: 24 U/L (ref 15–41)
Albumin: 3.8 g/dL (ref 3.5–5.0)
Alkaline Phosphatase: 60 U/L (ref 38–126)
Anion gap: 10 (ref 5–15)
BUN: 9 mg/dL (ref 6–20)
CO2: 26 mmol/L (ref 22–32)
Calcium: 9 mg/dL (ref 8.9–10.3)
Chloride: 99 mmol/L (ref 98–111)
Creatinine, Ser: 0.85 mg/dL (ref 0.61–1.24)
GFR, Estimated: >60 mL/min (ref >60)
Glucose, Bld: 128 mg/dL — ABNORMAL HIGH (ref 70–99)
Potassium: 3.4 mmol/L — ABNORMAL LOW (ref 3.5–5.1)
Sodium: 135 mmol/L (ref 135–145)
Total Bilirubin: 0.2 mg/dL (ref 0.0–1.2)
Total Protein: 7.8 g/dL (ref 6.5–8.1)

## 2024-07-11 LAB — URINALYSIS, W/ REFLEX TO CULTURE (INFECTION SUSPECTED)
Bacteria, UA: NONE SEEN
Bilirubin Urine: NEGATIVE
Glucose, UA: NEGATIVE mg/dL
Hgb urine dipstick: NEGATIVE
Ketones, ur: NEGATIVE mg/dL
Leukocytes,Ua: NEGATIVE
Nitrite: NEGATIVE
Protein, ur: NEGATIVE mg/dL
Specific Gravity, Urine: 1.004 — ABNORMAL LOW (ref 1.005–1.030)
Squamous Epithelial / HPF: 0 /HPF (ref 0–5)
pH: 6 (ref 5.0–8.0)

## 2024-07-11 LAB — URINE DRUG SCREEN, QUALITATIVE (ARMC ONLY)
Amphetamines, Ur Screen: NOT DETECTED
Barbiturates, Ur Screen: NOT DETECTED
Benzodiazepine, Ur Scrn: NOT DETECTED
Cannabinoid 50 Ng, Ur ~~LOC~~: POSITIVE — AB
Cocaine Metabolite,Ur ~~LOC~~: POSITIVE — AB
MDMA (Ecstasy)Ur Screen: NOT DETECTED
Methadone Scn, Ur: NOT DETECTED
Opiate, Ur Screen: NOT DETECTED
Phencyclidine (PCP) Ur S: NOT DETECTED
Tricyclic, Ur Screen: NOT DETECTED

## 2024-07-11 LAB — CBC
HCT: 43.7 % (ref 39.0–52.0)
Hemoglobin: 13.7 g/dL (ref 13.0–17.0)
MCH: 27.6 pg (ref 26.0–34.0)
MCHC: 31.4 g/dL (ref 30.0–36.0)
MCV: 87.9 fL (ref 80.0–100.0)
Platelets: 226 K/uL (ref 150–400)
RBC: 4.97 MIL/uL (ref 4.22–5.81)
RDW: 16.8 % — ABNORMAL HIGH (ref 11.5–15.5)
WBC: 7.9 K/uL (ref 4.0–10.5)
nRBC: 0 % (ref 0.0–0.2)

## 2024-07-11 LAB — CHLAMYDIA/NGC RT PCR (ARMC ONLY)
Chlamydia Tr: NOT DETECTED
N gonorrhoeae: NOT DETECTED

## 2024-07-11 LAB — ETHANOL: Alcohol, Ethyl (B): 172 mg/dL — ABNORMAL HIGH (ref <15)

## 2024-07-11 MED ORDER — LORAZEPAM 2 MG/ML IJ SOLN: 1.0000 mg | INTRAMUSCULAR | Status: DC | PRN

## 2024-07-11 MED ORDER — FOLIC ACID 1 MG PO TABS
1.0000 mg | ORAL_TABLET | Freq: Every day | ORAL | Status: DC
  Administered 2024-07-11: 1 mg via ORAL
  Filled 2024-07-11: qty 1

## 2024-07-11 MED ORDER — THIAMINE MONONITRATE 100 MG PO TABS
100.0000 mg | ORAL_TABLET | Freq: Every day | ORAL | Status: DC
  Administered 2024-07-11: 100 mg via ORAL
  Filled 2024-07-11: qty 1

## 2024-07-11 MED ORDER — ADULT MULTIVITAMIN W/MINERALS CH
1.0000 | ORAL_TABLET | Freq: Every day | ORAL | Status: DC
  Administered 2024-07-11: 1 via ORAL
  Filled 2024-07-11: qty 1

## 2024-07-11 MED ORDER — THIAMINE HCL 100 MG/ML IJ SOLN
100.0000 mg | Freq: Every day | INTRAMUSCULAR | Status: DC
  Filled 2024-07-11: qty 1

## 2024-07-11 MED ORDER — LORAZEPAM 1 MG PO TABS: 1.0000 mg | ORAL_TABLET | ORAL | Status: DC | PRN

## 2024-07-11 NOTE — ED Notes (Signed)
 Pt taken to [place]  with security an [employer]

## 2024-07-11 NOTE — ED Provider Notes (Signed)
 Emergency Medicine Observation Re-evaluation Note  Alex [name] is a 48 y.o. male, seen on rounds today.  Pt initially presented to the ED for complaints of Mental Health Problem  Currently, the patient is calm, no acute complaints.  Physical Exam  Blood pressure (!) 141/89, pulse 86, temperature 98.3 F (36.8 C), temperature source Oral, resp. rate 20, height 5' 5 (1.651 m), weight 47.2 kg, SpO2 97%. Physical Exam General: NAD Lungs: CTAB Psych: not agitated  ED Course / MDM  EKG:    I have reviewed the labs performed to date as well as medications administered while in observation.  Recent changes in the last 24 hours include no acute events overnight.  Seen by psychiatry, cleared for discharge.  Plan  Current plan is for discharge. Patient is not under full IVC at this time.   Viviann Pastor, MD 07/11/24 [phone]

## 2024-07-11 NOTE — ED Provider Notes (Signed)
 [facility] Provider Note    Event Date/Time   First MD Initiated Contact with Patient 07/11/24 0501     (approximate)   History   Mental Health Problem   HPI  Alex [name] is a 48 y.o. male   Past medical history of polysubstance use, depression, here with request to see psychiatry for depression and suicidal ideation.  No active plan.  Has vague homicidal thoughts as well.  Had alcohol tonight.  Also complains of right testicle pain.  He feels a lump there.  He has no urinary symptoms.  No penile discharge.  No reported trauma.  External Medical Documents Reviewed: Prior hospital notes      Physical Exam   Triage Vital Signs: ED Triage Vitals [07/11/24 0426]  Encounter Vitals Group     BP (!) 141/89     Girls Systolic BP Percentile      Girls Diastolic BP Percentile      Boys Systolic BP Percentile      Boys Diastolic BP Percentile      Pulse Rate 86     Resp 20     Temp 98.3 F (36.8 C)     Temp Source Oral     SpO2 97 %     Weight 104 lb (47.2 kg)     Height 5' 5 (1.651 m)     Head Circumference      Peak Flow      Pain Score 0     Pain Loc      Pain Education      Exclude from Growth Chart     Most recent vital signs: Vitals:   07/11/24 0426  BP: (!) 141/89  Pulse: 86  Resp: 20  Temp: 98.3 F (36.8 C)  SpO2: 97%    General: Awake, no distress.  CV:  Good peripheral perfusion.  Resp:  Normal effort.  Abd:  No distention.  Other:  Small old appearing abrasion to the nose.  No other signs of head trauma.  Answering questions appropriately.  Testicles examined no skin changes no obvious mass or swelling but does state tenderness to palpation along the right testicle.   ED Results / Procedures / Treatments   Labs (all labs ordered are listed, but only abnormal results are displayed) Labs Reviewed  COMPREHENSIVE METABOLIC PANEL WITH GFR - Abnormal; Notable for the following components:      Result Value    Potassium 3.4 (*)    Glucose, Bld 128 (*)    All other components within normal limits  ETHANOL - Abnormal; Notable for the following components:   Alcohol, Ethyl (B) 172 (*)    All other components within normal limits  CBC - Abnormal; Notable for the following components:   RDW 16.8 (*)    All other components within normal limits  CHLAMYDIA/NGC RT PCR ([facility] ONLY)            URINE DRUG SCREEN, QUALITATIVE ([facility] ONLY)  URINALYSIS, W/ REFLEX TO CULTURE (INFECTION SUSPECTED)     I ordered and reviewed the above labs they are notable for alcohol level is 172 otherwise cell counts electrolytes unremarkable   PROCEDURES:  Critical Care performed: No  Procedures   MEDICATIONS ORDERED IN ED: Medications - No data to display  IMPRESSION / MDM / ASSESSMENT AND PLAN / ED COURSE  I reviewed the triage vital signs and the nursing notes.  Patient's presentation is most consistent with acute presentation with potential threat to life or bodily function.  Differential diagnosis includes, but is not limited to, etoh intoxication, depression, SI, testicle cancer, infection, uti/sti  MDM:    Here with alcohol intoxication requesting psychiatric evaluation for passive SI and HI.  I will put in for psychiatric consultation.  I see no reason to IVC him at this time.  Old appearing abrasion to the nose was seen in evaluation earlier this week and got a CT of the head maxillofacial and cervical spine which was negative.  No new trauma reported by patient nor seen on my examination.  Defer advanced imaging.  In terms of his testicle lump I do not feel anything there but does state that it is painful so I will get an ultrasound of the testicles, check urinalysis.  If unremarkable plan will be for clearance for psychiatric evaluation.       FINAL CLINICAL IMPRESSION(S) / ED DIAGNOSES   Final diagnoses:  Suicidal ideation  Alcoholic intoxication without  complication     Rx / DC Orders   ED Discharge Orders     None        Note:  This document was prepared using Dragon voice recognition software and may include unintentional dictation errors.    Cyrena Mylar, MD 07/11/24 [phone]

## 2024-07-11 NOTE — ED Triage Notes (Addendum)
 Patient ambulatory to triage with steady gait, without difficulty or distress noted, brought in by [facility] voluntarily; pt admits to thoughts of hurting self and others; +ETOH; abrasion noted to left knee and nose
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
## 2024-07-11 NOTE — Discharge Instructions (Addendum)

## 2024-07-11 NOTE — ED Notes (Addendum)
 With this nurse and EDT Tracey present, pt removes red t-shirt, black shorts, gray socks, white underwear, gray slides, camaflouge sweatshirt, cellphone, grey cap--all placed in labeled pt belonging bag to be secured on nursing unit and pt changed into behav scrubs

## 2024-07-11 NOTE — ED Notes (Signed)
 Vol lum and TOC consult pending

## 2024-07-11 NOTE — ED Notes (Signed)
 Pt asking for meds to sleep. Explained to patient it's too late to get those meds at this time. Also requesting seizure medications.

## 2024-07-11 NOTE — ED Notes (Signed)
 Pt has asked for their clothes and states I'm ready to go. I signed myself and now I'm ready to leave. I want my clothes so I can leave. Pt was asked why they came to the hospital and they stated because I've got some problems. When asked what has changed to help or make those problems go away in the last 7 hours pt stated I'm just going to go and sit in the woods for awhile. This RN asked the pt about again about his SI/HI thoughts and pt stated that when I was in there sleeping I got to thinking about things and I just want to go.

## 2024-07-11 NOTE — ED Notes (Signed)

## 2024-07-11 NOTE — ED Notes (Signed)
 Pt returned from [place]  with security and tech

## 2024-07-11 NOTE — BH Assessment (Signed)
 Psych Team attempted to interview patient, who could not be aroused. Will try back at later time when patient is able to participate.
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
## 2024-07-11 NOTE — ED Notes (Signed)
Pt given breakfast tray and beverage.  

## 2024-10-08 ENCOUNTER — Other Ambulatory Visit: Payer: Self-pay

## 2024-10-08 ENCOUNTER — Emergency Department
Admission: EM | Admit: 2024-10-08 | Discharge: 2024-10-08 | Disposition: A | Discharge status: Home / Self Care | Payer: MEDICAID | Attending: Emergency Medicine | Admitting: Emergency Medicine

## 2024-10-08 DIAGNOSIS — R569 Unspecified convulsions: Secondary | ICD-10-CM | POA: Diagnosis present

## 2024-10-08 DIAGNOSIS — Y905 Blood alcohol level of 100-119 mg/100 ml: Secondary | ICD-10-CM | POA: Diagnosis not present

## 2024-10-08 LAB — URINALYSIS, ROUTINE W REFLEX MICROSCOPIC
Bilirubin Urine: NEGATIVE
Glucose, UA: NEGATIVE mg/dL
Hgb urine dipstick: NEGATIVE
Ketones, ur: NEGATIVE mg/dL
Leukocytes,Ua: NEGATIVE
Nitrite: NEGATIVE
Protein, ur: NEGATIVE mg/dL
Specific Gravity, Urine: 1.005 (ref 1.005–1.030)
pH: 5 (ref 5.0–8.0)

## 2024-10-08 LAB — COMPREHENSIVE METABOLIC PANEL WITH GFR
ALT: 12 U/L (ref 0–44)
AST: 22 U/L (ref 15–41)
Albumin: 4.2 g/dL (ref 3.5–5.0)
Alkaline Phosphatase: 79 U/L (ref 38–126)
Anion gap: 14 (ref 5–15)
BUN: 11 mg/dL (ref 6–20)
CO2: 17 mmol/L — ABNORMAL LOW (ref 22–32)
Calcium: 9.3 mg/dL (ref 8.9–10.3)
Chloride: 101 mmol/L (ref 98–111)
Creatinine, Ser: 0.81 mg/dL (ref 0.61–1.24)
GFR, Estimated: >60 mL/min
Glucose, Bld: 77 mg/dL (ref 70–99)
Potassium: 4.1 mmol/L (ref 3.5–5.1)
Sodium: 132 mmol/L — ABNORMAL LOW (ref 135–145)
Total Bilirubin: 0.5 mg/dL (ref 0.0–1.2)
Total Protein: 7.4 g/dL (ref 6.5–8.1)

## 2024-10-08 LAB — CBC WITH DIFFERENTIAL/PLATELET
Abs Immature Granulocytes: 0.03 K/uL (ref 0.00–0.07)
Basophils Absolute: 0 K/uL (ref 0.0–0.1)
Basophils Relative: 0 %
Eosinophils Absolute: 0.2 K/uL (ref 0.0–0.5)
Eosinophils Relative: 3 %
HCT: 44.8 % (ref 39.0–52.0)
Hemoglobin: 14.1 g/dL (ref 13.0–17.0)
Immature Granulocytes: 0 %
Lymphocytes Relative: 19 %
Lymphs Abs: 1.6 K/uL (ref 0.7–4.0)
MCH: 29.3 pg (ref 26.0–34.0)
MCHC: 31.5 g/dL (ref 30.0–36.0)
MCV: 93.1 fL (ref 80.0–100.0)
Monocytes Absolute: 0.8 K/uL (ref 0.1–1.0)
Monocytes Relative: 9 %
Neutro Abs: 5.8 K/uL (ref 1.7–7.7)
Neutrophils Relative %: 69 %
Platelets: 190 K/uL (ref 150–400)
RBC: 4.81 MIL/uL (ref 4.22–5.81)
RDW: 15.4 % (ref 11.5–15.5)
WBC: 8.4 K/uL (ref 4.0–10.5)
nRBC: 0 % (ref 0.0–0.2)

## 2024-10-08 LAB — URINE DRUG SCREEN
Amphetamines: NEGATIVE
Barbiturates: NEGATIVE
Benzodiazepines: NEGATIVE
Cocaine: POSITIVE — AB
Fentanyl: NEGATIVE
Methadone Scn, Ur: NEGATIVE
Opiates: NEGATIVE
Tetrahydrocannabinol: POSITIVE — AB

## 2024-10-08 LAB — ETHANOL: Alcohol, Ethyl (B): 109 mg/dL — ABNORMAL HIGH

## 2024-10-08 LAB — SALICYLATE LEVEL: Salicylate Lvl: <7 mg/dL — ABNORMAL LOW (ref 7.0–30.0)

## 2024-10-08 LAB — MAGNESIUM: Magnesium: 2.3 mg/dL (ref 1.7–2.4)

## 2024-10-08 LAB — CBG MONITORING, ED
Glucose-Capillary: 69 mg/dL — ABNORMAL LOW (ref 70–99)
Glucose-Capillary: 92 mg/dL (ref 70–99)

## 2024-10-08 LAB — ACETAMINOPHEN LEVEL: Acetaminophen (Tylenol), Serum: <10 ug/mL — ABNORMAL LOW (ref 10–30)

## 2024-10-08 MED ORDER — VALPROIC ACID 250 MG PO CAPS
750.0000 mg | ORAL_CAPSULE | Freq: Once | ORAL | Status: AC
  Administered 2024-10-08: 750 mg via ORAL
  Filled 2024-10-08 (×2): qty 3

## 2024-10-08 MED ORDER — VALPROIC ACID 250 MG PO CAPS
750.0000 mg | ORAL_CAPSULE | Freq: Every day | ORAL | 1 refills | Status: AC
  Filled 2024-10-08: qty 90, 30d supply, fill #0

## 2024-10-08 MED ORDER — LACTATED RINGERS IV BOLUS
1000.0000 mL | Freq: Once | INTRAVENOUS | Status: AC
  Administered 2024-10-08: 1000 mL via INTRAVENOUS

## 2024-10-08 NOTE — ED Provider Notes (Signed)
 "  [facility] Provider Note    Event Date/Time   First MD Initiated Contact with Patient 10/08/24 0239     (approximate)   History   Seizure Like Activity   HPI  Alex [name] is a 49 y.o. male who presents to the ED for evaluation of Seizure Like Activity   Patient presents after witnessed seizure-like activity.  He self-reports a seizure history on valproic  acid but has been noncompliant with this medication due to financial constraints.  He arrives to the ED confused and encephalopathic, majority of history is provided later after he normalizes and is back to baseline.   Physical Exam   Triage Vital Signs: ED Triage Vitals  Encounter Vitals Group     BP      Girls Systolic BP Percentile      Girls Diastolic BP Percentile      Boys Systolic BP Percentile      Boys Diastolic BP Percentile      Pulse      Resp      Temp      Temp src      SpO2      Weight      Height      Head Circumference      Peak Flow      Pain Score      Pain Loc      Pain Education      Exclude from Growth Chart     Most recent vital signs: Vitals:   10/08/24 0245 10/08/24 0310  BP: 120/76 120/76  Pulse: 70 65  Resp: 20   Temp: 98 F (36.7 C)   SpO2: 96%     General: Awake, no distress.  CV:  Good peripheral perfusion.  Resp:  Normal effort.  Abd:  No distention.  MSK:  No deformity noted.  Neuro:  No focal deficits appreciated. Other:     ED Results / Procedures / Treatments   Labs (all labs ordered are listed, but only abnormal results are displayed) Labs Reviewed  ETHANOL - Abnormal; Notable for the following components:      Result Value   Alcohol, Ethyl (B) 109 (*)    All other components within normal limits  SALICYLATE LEVEL - Abnormal; Notable for the following components:   Salicylate Lvl <7.0 (*)    All other components within normal limits  ACETAMINOPHEN  LEVEL - Abnormal; Notable for the following components:   Acetaminophen   (Tylenol ), Serum <10 (*)    All other components within normal limits  URINE DRUG SCREEN - Abnormal; Notable for the following components:   Cocaine POSITIVE (*)    Tetrahydrocannabinol POSITIVE (*)    All other components within normal limits  COMPREHENSIVE METABOLIC PANEL WITH GFR - Abnormal; Notable for the following components:   Sodium 132 (*)    CO2 17 (*)    All other components within normal limits  URINALYSIS, ROUTINE W REFLEX MICROSCOPIC - Abnormal; Notable for the following components:   Color, Urine YELLOW (*)    APPearance CLEAR (*)    All other components within normal limits  CBG MONITORING, ED - Abnormal; Notable for the following components:   Glucose-Capillary 69 (*)    All other components within normal limits  CBC WITH DIFFERENTIAL/PLATELET  MAGNESIUM   CBG MONITORING, ED    EKG Sinus rhythm with a rate of 71 bpm, leftward axis, normal intervals, no STEMI.  Abnormal R wave progression  RADIOLOGY   Official  radiology report(s): No results found.  PROCEDURES and INTERVENTIONS:  Procedures  Medications  valproic  acid (DEPAKENE ) 250 MG capsule 750 mg (has no administration in time range)  lactated ringers  bolus 1,000 mL (1,000 mLs Intravenous New Bag/Given 10/08/24 0308)     IMPRESSION / MDM / ASSESSMENT AND PLAN / ED COURSE  I reviewed the triage vital signs and the nursing notes.  Differential diagnosis includes, but is not limited to, seizure, pseudoseizure, status epilepticus, electrolyte derangement, polypharmacy or polysubstance abuse  {Patient presents with symptoms of an acute illness or injury that is potentially life-threatening.  Patient with a history of seizure disorder and polysubstance abuse presents after a reported seizure-like episode.  No seizure activity here in the ED though he does arrive encephalopathic and this clears, possibly postictal on arrival to the ED.  His workup is generally benign.  Normal CBC, metabolic panel and UA.   UDS with cocaine metabolites, likely a contributor to his possible seizure in addition to medication noncompliance.  Will provide a dose of his valproic  acid as documented and send a prescription to the local community pharmacy where hopefully he can get this medication.  He is suitable for outpatient management.  Discussed ED return precautions  Clinical Course as of 10/08/24 0643  Mon Oct 08, 2024  0421 Reassessed.  Comfortably asleep with normal vital signs [DS]  0533 Reassessed, patient much more lucid and seems back to baseline.  We discussed the possibility that he had a seizure. [DS]  9466 Reports being prescribed valproic  acid but has been noncompliant with it due to financial constraints [DS]    Clinical Course User Index [DS] Claudene Rover, MD     FINAL CLINICAL IMPRESSION(S) / ED DIAGNOSES   Final diagnoses:  Seizure (HCC)     Rx / DC Orders   ED Discharge Orders          Ordered    valproic  acid (DEPAKENE ) 250 MG capsule  Daily at bedtime        10/08/24 9364             Note:  This document was prepared using Dragon voice recognition software and may include unintentional dictation errors.   Claudene Rover, MD 10/08/24 [phone]  "

## 2024-10-08 NOTE — ED Triage Notes (Signed)
 Pt arrives from home via [facility].  Mother called out d/t seizure like activity rigidity with HX of ETOH withdrawal seizures. Last drink 12pm Sunday.  Pt A&Ox0. NAD. Able to follow directions.

## 2024-10-08 NOTE — ED Notes (Signed)
 Apple juice given for CBG of 69.
